# Patient Record
Sex: Female | Born: 1935 | Race: White | Hispanic: No | Smoking: Former smoker
Health system: Southern US, Community
[De-identification: ages and names within clinical notes are randomized; demographics above are authoritative.]

## PROBLEM LIST (undated history)

## (undated) DIAGNOSIS — G709 Myoneural disorder, unspecified: Secondary | ICD-10-CM

## (undated) DIAGNOSIS — E079 Disorder of thyroid, unspecified: Secondary | ICD-10-CM

## (undated) DIAGNOSIS — N3020 Other chronic cystitis without hematuria: Secondary | ICD-10-CM

## (undated) DIAGNOSIS — C801 Malignant (primary) neoplasm, unspecified: Secondary | ICD-10-CM

## (undated) DIAGNOSIS — G252 Other specified forms of tremor: Secondary | ICD-10-CM

## (undated) DIAGNOSIS — I2510 Atherosclerotic heart disease of native coronary artery without angina pectoris: Secondary | ICD-10-CM

## (undated) DIAGNOSIS — E039 Hypothyroidism, unspecified: Secondary | ICD-10-CM

## (undated) DIAGNOSIS — G4700 Insomnia, unspecified: Secondary | ICD-10-CM

## (undated) DIAGNOSIS — E785 Hyperlipidemia, unspecified: Secondary | ICD-10-CM

## (undated) DIAGNOSIS — Z8579 Personal history of other malignant neoplasms of lymphoid, hematopoietic and related tissues: Secondary | ICD-10-CM

## (undated) DIAGNOSIS — Z8572 Personal history of non-Hodgkin lymphomas: Secondary | ICD-10-CM

## (undated) DIAGNOSIS — I10 Essential (primary) hypertension: Secondary | ICD-10-CM

## (undated) DIAGNOSIS — Z789 Other specified health status: Secondary | ICD-10-CM

## (undated) DIAGNOSIS — K219 Gastro-esophageal reflux disease without esophagitis: Secondary | ICD-10-CM

## (undated) DIAGNOSIS — D649 Anemia, unspecified: Secondary | ICD-10-CM

## (undated) HISTORY — DX: Other chronic cystitis without hematuria: N30.20

## (undated) HISTORY — DX: Personal history of non-Hodgkin lymphomas: Z85.72

## (undated) HISTORY — PX: ABDOMINAL HYSTERECTOMY: SHX81

## (undated) HISTORY — DX: Malignant (primary) neoplasm, unspecified: C80.1

## (undated) HISTORY — DX: Essential (primary) hypertension: I10

## (undated) HISTORY — DX: Gastro-esophageal reflux disease without esophagitis: K21.9

## (undated) HISTORY — PX: APPENDECTOMY: SHX54

## (undated) HISTORY — PX: STOMACH SURGERY: SHX791

## (undated) HISTORY — DX: Personal history of other malignant neoplasms of lymphoid, hematopoietic and related tissues: Z85.79

## (undated) HISTORY — DX: Hyperlipidemia, unspecified: E78.5

## (undated) HISTORY — DX: Disorder of thyroid, unspecified: E07.9

## (undated) HISTORY — PX: BACK SURGERY: SHX140

## (undated) HISTORY — PX: EYE SURGERY: SHX253

---

## 1998-12-23 ENCOUNTER — Ambulatory Visit (HOSPITAL_COMMUNITY): Admission: RE | Admit: 1998-12-23 | Discharge: 1998-12-23 | Payer: Self-pay | Admitting: Obstetrics & Gynecology

## 2003-07-08 ENCOUNTER — Ambulatory Visit (HOSPITAL_BASED_OUTPATIENT_CLINIC_OR_DEPARTMENT_OTHER): Admission: RE | Admit: 2003-07-08 | Discharge: 2003-07-08 | Payer: Self-pay | Admitting: Neurology

## 2003-08-01 HISTORY — PX: CARDIAC CATHETERIZATION: SHX172

## 2004-04-30 ENCOUNTER — Ambulatory Visit: Payer: Self-pay | Admitting: Oncology

## 2004-07-12 ENCOUNTER — Ambulatory Visit: Payer: Self-pay | Admitting: Oncology

## 2004-07-27 ENCOUNTER — Ambulatory Visit: Payer: Self-pay | Admitting: Internal Medicine

## 2004-10-11 ENCOUNTER — Ambulatory Visit: Payer: Self-pay | Admitting: Oncology

## 2005-01-12 ENCOUNTER — Ambulatory Visit: Payer: Self-pay | Admitting: Vascular Surgery

## 2005-01-18 ENCOUNTER — Ambulatory Visit: Payer: Self-pay | Admitting: Oncology

## 2005-01-28 ENCOUNTER — Ambulatory Visit: Payer: Self-pay | Admitting: Oncology

## 2005-03-16 ENCOUNTER — Other Ambulatory Visit: Payer: Self-pay

## 2005-03-16 ENCOUNTER — Emergency Department: Payer: Self-pay | Admitting: Unknown Physician Specialty

## 2005-03-16 ENCOUNTER — Ambulatory Visit: Payer: Self-pay | Admitting: Oncology

## 2005-03-17 ENCOUNTER — Ambulatory Visit: Payer: Self-pay | Admitting: Internal Medicine

## 2005-04-07 ENCOUNTER — Ambulatory Visit: Payer: Self-pay | Admitting: Internal Medicine

## 2005-04-19 ENCOUNTER — Ambulatory Visit: Payer: Self-pay | Admitting: Surgery

## 2005-04-28 ENCOUNTER — Ambulatory Visit: Payer: Self-pay | Admitting: Oncology

## 2005-04-30 ENCOUNTER — Ambulatory Visit: Payer: Self-pay | Admitting: Oncology

## 2005-05-03 ENCOUNTER — Ambulatory Visit: Payer: Self-pay | Admitting: Surgery

## 2005-05-23 ENCOUNTER — Ambulatory Visit: Payer: Self-pay | Admitting: Internal Medicine

## 2005-06-07 ENCOUNTER — Ambulatory Visit: Payer: Self-pay | Admitting: Oncology

## 2005-06-16 ENCOUNTER — Ambulatory Visit: Payer: Self-pay | Admitting: Urology

## 2005-06-23 ENCOUNTER — Ambulatory Visit: Payer: Self-pay | Admitting: Unknown Physician Specialty

## 2005-06-30 ENCOUNTER — Ambulatory Visit: Payer: Self-pay | Admitting: Oncology

## 2005-07-11 ENCOUNTER — Ambulatory Visit: Payer: Self-pay | Admitting: Unknown Physician Specialty

## 2005-08-08 ENCOUNTER — Ambulatory Visit: Payer: Self-pay | Admitting: Oncology

## 2005-08-31 ENCOUNTER — Ambulatory Visit: Payer: Self-pay | Admitting: Oncology

## 2005-10-10 ENCOUNTER — Ambulatory Visit: Payer: Self-pay | Admitting: Oncology

## 2005-10-29 ENCOUNTER — Ambulatory Visit: Payer: Self-pay | Admitting: Oncology

## 2005-12-14 ENCOUNTER — Ambulatory Visit: Payer: Self-pay | Admitting: Unknown Physician Specialty

## 2005-12-28 ENCOUNTER — Ambulatory Visit: Payer: Self-pay | Admitting: Oncology

## 2006-01-03 ENCOUNTER — Ambulatory Visit: Payer: Self-pay | Admitting: Internal Medicine

## 2006-02-07 ENCOUNTER — Ambulatory Visit (HOSPITAL_COMMUNITY): Admission: RE | Admit: 2006-02-07 | Discharge: 2006-02-07 | Payer: Self-pay | Admitting: *Deleted

## 2006-03-01 ENCOUNTER — Ambulatory Visit: Payer: Self-pay | Admitting: Oncology

## 2006-03-31 ENCOUNTER — Ambulatory Visit: Payer: Self-pay | Admitting: Oncology

## 2006-05-09 ENCOUNTER — Ambulatory Visit: Payer: Self-pay | Admitting: Oncology

## 2006-05-21 ENCOUNTER — Ambulatory Visit: Payer: Self-pay | Admitting: Surgery

## 2006-05-31 ENCOUNTER — Ambulatory Visit: Payer: Self-pay | Admitting: Oncology

## 2006-07-10 ENCOUNTER — Ambulatory Visit: Payer: Self-pay | Admitting: Unknown Physician Specialty

## 2006-07-31 HISTORY — PX: SPINE SURGERY: SHX786

## 2006-10-12 ENCOUNTER — Ambulatory Visit: Payer: Self-pay

## 2006-11-26 ENCOUNTER — Encounter: Admission: RE | Admit: 2006-11-26 | Discharge: 2006-11-26 | Payer: Self-pay | Admitting: Specialist

## 2006-11-29 ENCOUNTER — Ambulatory Visit: Payer: Self-pay | Admitting: Oncology

## 2006-12-18 ENCOUNTER — Ambulatory Visit: Payer: Self-pay | Admitting: Oncology

## 2006-12-30 ENCOUNTER — Ambulatory Visit: Payer: Self-pay | Admitting: Oncology

## 2007-01-24 ENCOUNTER — Ambulatory Visit: Payer: Self-pay | Admitting: Unknown Physician Specialty

## 2007-01-29 ENCOUNTER — Ambulatory Visit: Payer: Self-pay | Admitting: Oncology

## 2007-03-04 ENCOUNTER — Other Ambulatory Visit: Payer: Self-pay

## 2007-03-04 ENCOUNTER — Emergency Department: Payer: Self-pay | Admitting: Internal Medicine

## 2007-03-06 ENCOUNTER — Inpatient Hospital Stay: Payer: Self-pay | Admitting: Internal Medicine

## 2007-03-06 ENCOUNTER — Other Ambulatory Visit: Payer: Self-pay

## 2007-03-08 ENCOUNTER — Other Ambulatory Visit: Payer: Self-pay

## 2007-03-27 ENCOUNTER — Inpatient Hospital Stay (HOSPITAL_COMMUNITY): Admission: RE | Admit: 2007-03-27 | Discharge: 2007-04-03 | Payer: Self-pay | Admitting: Neurosurgery

## 2007-04-03 ENCOUNTER — Encounter: Payer: Self-pay | Admitting: Internal Medicine

## 2007-04-29 ENCOUNTER — Ambulatory Visit: Payer: Self-pay | Admitting: Neurosurgery

## 2007-06-01 ENCOUNTER — Ambulatory Visit: Payer: Self-pay | Admitting: Oncology

## 2007-06-19 ENCOUNTER — Ambulatory Visit: Payer: Self-pay | Admitting: Oncology

## 2007-07-01 ENCOUNTER — Ambulatory Visit: Payer: Self-pay | Admitting: Oncology

## 2007-07-15 ENCOUNTER — Ambulatory Visit: Payer: Self-pay | Admitting: Internal Medicine

## 2007-07-20 ENCOUNTER — Emergency Department: Payer: Self-pay | Admitting: Emergency Medicine

## 2007-07-20 ENCOUNTER — Other Ambulatory Visit: Payer: Self-pay

## 2007-07-23 ENCOUNTER — Encounter: Payer: Self-pay | Admitting: Internal Medicine

## 2007-08-01 ENCOUNTER — Ambulatory Visit: Payer: Self-pay | Admitting: Oncology

## 2007-08-01 ENCOUNTER — Encounter: Payer: Self-pay | Admitting: Internal Medicine

## 2007-08-21 ENCOUNTER — Ambulatory Visit: Payer: Self-pay | Admitting: Internal Medicine

## 2007-09-01 ENCOUNTER — Ambulatory Visit: Payer: Self-pay | Admitting: Oncology

## 2007-09-29 ENCOUNTER — Ambulatory Visit: Payer: Self-pay | Admitting: Oncology

## 2007-10-30 ENCOUNTER — Ambulatory Visit: Payer: Self-pay | Admitting: Oncology

## 2007-11-29 ENCOUNTER — Ambulatory Visit: Payer: Self-pay | Admitting: Oncology

## 2007-12-30 ENCOUNTER — Ambulatory Visit: Payer: Self-pay | Admitting: Oncology

## 2008-01-15 ENCOUNTER — Ambulatory Visit: Payer: Self-pay | Admitting: Internal Medicine

## 2008-01-23 ENCOUNTER — Ambulatory Visit: Payer: Self-pay | Admitting: Oncology

## 2008-01-29 ENCOUNTER — Ambulatory Visit: Payer: Self-pay | Admitting: Oncology

## 2008-02-25 ENCOUNTER — Encounter: Payer: Self-pay | Admitting: Internal Medicine

## 2008-02-26 ENCOUNTER — Ambulatory Visit: Payer: Self-pay | Admitting: Specialist

## 2008-03-31 ENCOUNTER — Ambulatory Visit: Payer: Self-pay | Admitting: Oncology

## 2008-04-17 ENCOUNTER — Ambulatory Visit: Payer: Self-pay | Admitting: Oncology

## 2008-04-27 ENCOUNTER — Ambulatory Visit: Payer: Self-pay | Admitting: Urology

## 2008-04-30 ENCOUNTER — Ambulatory Visit: Payer: Self-pay | Admitting: Oncology

## 2008-07-14 ENCOUNTER — Ambulatory Visit: Payer: Self-pay | Admitting: Internal Medicine

## 2008-07-31 ENCOUNTER — Ambulatory Visit: Payer: Self-pay | Admitting: Oncology

## 2008-08-05 ENCOUNTER — Ambulatory Visit: Payer: Self-pay | Admitting: Oncology

## 2008-08-31 ENCOUNTER — Ambulatory Visit: Payer: Self-pay | Admitting: Oncology

## 2008-09-28 ENCOUNTER — Ambulatory Visit: Payer: Self-pay | Admitting: Oncology

## 2008-10-14 ENCOUNTER — Ambulatory Visit: Payer: Self-pay | Admitting: Internal Medicine

## 2008-10-28 ENCOUNTER — Ambulatory Visit: Payer: Self-pay | Admitting: Oncology

## 2008-10-29 ENCOUNTER — Ambulatory Visit: Payer: Self-pay | Admitting: Oncology

## 2008-11-04 ENCOUNTER — Ambulatory Visit: Payer: Self-pay | Admitting: Internal Medicine

## 2008-12-17 ENCOUNTER — Ambulatory Visit: Payer: Self-pay | Admitting: Internal Medicine

## 2009-04-30 ENCOUNTER — Ambulatory Visit: Payer: Self-pay | Admitting: Oncology

## 2009-05-03 ENCOUNTER — Ambulatory Visit: Payer: Self-pay | Admitting: Oncology

## 2009-05-31 ENCOUNTER — Ambulatory Visit: Payer: Self-pay | Admitting: Oncology

## 2009-06-01 ENCOUNTER — Inpatient Hospital Stay: Payer: Self-pay | Admitting: Internal Medicine

## 2009-07-31 ENCOUNTER — Ambulatory Visit: Payer: Self-pay | Admitting: Oncology

## 2009-08-20 ENCOUNTER — Ambulatory Visit: Payer: Self-pay | Admitting: Oncology

## 2009-08-31 ENCOUNTER — Ambulatory Visit: Payer: Self-pay | Admitting: Oncology

## 2009-09-29 ENCOUNTER — Ambulatory Visit: Payer: Self-pay | Admitting: Internal Medicine

## 2009-10-06 ENCOUNTER — Ambulatory Visit: Payer: Self-pay | Admitting: Internal Medicine

## 2009-10-12 ENCOUNTER — Ambulatory Visit: Payer: Self-pay | Admitting: Internal Medicine

## 2009-10-19 ENCOUNTER — Ambulatory Visit: Payer: Self-pay | Admitting: Internal Medicine

## 2009-10-22 ENCOUNTER — Ambulatory Visit: Payer: Self-pay | Admitting: Internal Medicine

## 2009-10-25 ENCOUNTER — Ambulatory Visit: Payer: Self-pay | Admitting: Internal Medicine

## 2009-10-28 ENCOUNTER — Ambulatory Visit: Payer: Self-pay | Admitting: Internal Medicine

## 2009-11-01 ENCOUNTER — Ambulatory Visit: Payer: Self-pay | Admitting: Specialist

## 2009-11-05 ENCOUNTER — Ambulatory Visit: Payer: Self-pay | Admitting: Internal Medicine

## 2009-11-12 ENCOUNTER — Ambulatory Visit: Payer: Self-pay | Admitting: Internal Medicine

## 2009-11-15 ENCOUNTER — Ambulatory Visit: Payer: Self-pay | Admitting: Internal Medicine

## 2009-11-18 ENCOUNTER — Ambulatory Visit: Payer: Self-pay | Admitting: Internal Medicine

## 2009-11-25 ENCOUNTER — Ambulatory Visit: Payer: Self-pay | Admitting: Internal Medicine

## 2009-12-06 ENCOUNTER — Ambulatory Visit: Payer: Self-pay | Admitting: Internal Medicine

## 2010-01-28 ENCOUNTER — Ambulatory Visit: Payer: Self-pay | Admitting: Oncology

## 2010-02-17 ENCOUNTER — Ambulatory Visit: Payer: Self-pay | Admitting: Oncology

## 2010-02-28 ENCOUNTER — Ambulatory Visit: Payer: Self-pay | Admitting: Oncology

## 2010-03-15 ENCOUNTER — Ambulatory Visit: Payer: Self-pay | Admitting: Internal Medicine

## 2010-03-17 ENCOUNTER — Ambulatory Visit: Payer: Self-pay | Admitting: Internal Medicine

## 2010-03-31 ENCOUNTER — Ambulatory Visit: Payer: Self-pay | Admitting: Oncology

## 2010-04-07 ENCOUNTER — Emergency Department: Payer: Self-pay | Admitting: Emergency Medicine

## 2010-05-27 ENCOUNTER — Ambulatory Visit: Payer: Self-pay | Admitting: Internal Medicine

## 2010-07-15 ENCOUNTER — Ambulatory Visit: Payer: Self-pay | Admitting: Specialist

## 2010-07-31 HISTORY — PX: JOINT REPLACEMENT: SHX530

## 2010-08-23 ENCOUNTER — Ambulatory Visit: Payer: Self-pay | Admitting: Oncology

## 2010-08-26 ENCOUNTER — Ambulatory Visit: Payer: Self-pay | Admitting: Internal Medicine

## 2010-08-31 ENCOUNTER — Ambulatory Visit: Payer: Self-pay | Admitting: Oncology

## 2010-09-28 ENCOUNTER — Ambulatory Visit: Payer: Self-pay | Admitting: Pain Medicine

## 2010-10-11 ENCOUNTER — Ambulatory Visit: Payer: Self-pay | Admitting: Pain Medicine

## 2010-10-24 ENCOUNTER — Ambulatory Visit: Payer: Self-pay | Admitting: Pain Medicine

## 2010-10-27 ENCOUNTER — Ambulatory Visit: Payer: Self-pay | Admitting: Pain Medicine

## 2010-10-30 ENCOUNTER — Observation Stay: Payer: Self-pay | Admitting: Internal Medicine

## 2010-11-07 ENCOUNTER — Ambulatory Visit: Payer: Self-pay | Admitting: Pain Medicine

## 2010-11-16 ENCOUNTER — Ambulatory Visit: Payer: Self-pay | Admitting: Unknown Physician Specialty

## 2010-11-29 ENCOUNTER — Encounter: Payer: Self-pay | Admitting: Pain Medicine

## 2010-12-13 NOTE — Discharge Summary (Signed)
NAMEMECCA, Evelyn [name] NO.:  [id number]   MEDICAL RECORD NO.:  [id number]          PATIENT TYPE:  INP   LOCATION:  3034                         FACILITY:  [facility]   PHYSICIAN:  Hilda Lias, M.D.   DATE OF BIRTH:  May 18, 1920   DATE OF ADMISSION:  03/27/2007  DATE OF DISCHARGE:                               DISCHARGE SUMMARY   ADMISSION DIAGNOSIS:  Right L4-5 herniated disk.   FINAL DIAGNOSIS:  Right L4-5 herniated disk plus fracture of the right  foot.   HISTORY OF PRESENT ILLNESS:  The patient was admitted because of back  pain radiating to the right leg.  X-ray shows degenerative disk disease  with scoliosis with a herniated disk at the L4-5.  The patient wanted to  proceed with surgery.  Laboratory was normal.   HOSPITAL COURSE:  The patient was taken to surgery and a right L4-5  diskectomy was done.  After surgery, the pain got better until she  developed some numbness with no weakness.  She was stable but on August  31,2008, she fell and she complained of pain in the right leg.  X-ray  showed that she had a fracture of the fifth metatarsal bone in the right  side.  Right now she is complaining of some numbness, pain in the right  foot and pain in the right thigh. The patient was seen by the orthopedic  surgeon  and a boot was given for pain control.  Today she is feeling  better.  She is comfortably discharged to be followed by me and by Dr.  Sherlean Foot in a week from now.   CONDITION ON DISCHARGE:  Stable.   MEDICATIONS:  She was taking Neurontin 100 mg three times a day.  She  will continue taking her Vicodin for pain and all the medication that  she was taking prior to her discharge.  A copy will be enclosed with the  discharge summary.   DIET:  Regular.           ______________________________  Hilda Lias, M.D.     EB/MEDQ  D:  04/03/2007  T:  04/03/2007  Job:  [id number]

## 2010-12-13 NOTE — Op Note (Signed)
[name], [name] NO.:  [id number]   MEDICAL RECORD NO.:  [id number]          PATIENT TYPE:  INP   LOCATION:  3172                         FACILITY:  [facility]   PHYSICIAN:  Hilda Lias, M.D.   DATE OF BIRTH:  05/19/1920   DATE OF PROCEDURE:  DATE OF DISCHARGE:                               OPERATIVE REPORT   PREOPERATIVE DIAGNOSES:  1. Degenerative disk disease at multiple levels with severe scoliosis      to the right side.  2. Herniated disk L4-5 to the right with L5 radiculopathy.   POSTOPERATIVE DIAGNOSES:  1. Degenerative disk disease at multiple levels with severe scoliosis      to the right side.  2. Herniated disk L4-5 to the right with L5 radiculopathy.   PROCEDURES:  1. Right L4-5 laminotomy.  2. Right L4-5 diskectomy.  3. Foraminotomy to decompress the L5 and L4 nerve roots      microscopically.   SURGEON:  Hilda Lias, M.D.   ASSISTANT:  Coletta Memos, M.D.   CLINICAL HISTORY:  The patient is an 77 year old female complaining of  back pain with radiation to the right leg.  X-ray showed the presence of  severe degenerative disk disease with scoliosis to the right side with  compression to the levels of 3-4 and 4-5 with herniated disk central to  the right.  Patient has failed with conservative treatment and she wants  to proceed with surgery.   PROCEDURE:  Patient was taken to the operating room.  She was positioned  __________ .  The skin was prepped with DuraPrep.  Then x-rays showed  that indeed we were at the level of L4-5.  From then on, the skin was  opened at the midline from L4 to L5 and muscles were retracted  laterally.  Immediately, we counted the lower space and then we started  working with laminotomy L4-5 space above __________  with 2 x-rays.  Because of poor quality we ended up having a third one which showed that  indeed the needle was at the level of L4-5 disk.  The hook was at the  level of the L5 foramen.  From  then on, we did lysis of adhesions.  We  retracted the dura sac.  Indeed there was a herniated disk mostly  central and to the right, not only compromising the apex of L5, but also  the lateral ossicles of L4.  An incision was made and __________  beginning at the disk, mostly lateral were removed.  Therefore,  laminotomy to decompress the L4 and L5 nerve root was achieved.  A  __________  plenty of space.  Valsalva maneuver was negative.  Again,  the area was irrigated.  The wound was closed with a Vicryl and a Steri-  Strip.           ______________________________  Hilda Lias, M.D.    EB/MEDQ  D:  03/27/2007  T:  03/28/2007  Job:  [id number]

## 2010-12-30 ENCOUNTER — Encounter: Payer: Self-pay | Admitting: Pain Medicine

## 2011-02-20 ENCOUNTER — Ambulatory Visit: Payer: Self-pay | Admitting: Internal Medicine

## 2011-02-20 ENCOUNTER — Inpatient Hospital Stay: Payer: Self-pay | Admitting: Internal Medicine

## 2011-02-27 ENCOUNTER — Encounter: Payer: Self-pay | Admitting: Internal Medicine

## 2011-03-01 ENCOUNTER — Encounter: Payer: Self-pay | Admitting: Internal Medicine

## 2011-03-20 ENCOUNTER — Telehealth: Payer: Self-pay | Admitting: Internal Medicine

## 2011-03-20 MED ORDER — CEPHALEXIN 500 MG PO CAPS: 500.0000 mg | ORAL_CAPSULE | Freq: Three times a day (TID) | ORAL | Status: AC

## 2011-03-20 NOTE — Telephone Encounter (Signed)
Please ask her to drop off of urine if she can.  We can call her in cephalexin 500 mg three times daily for 7 days  21 no refills. Remind front desk to get her to sign  A release form when she comes by

## 2011-03-20 NOTE — Telephone Encounter (Signed)
Patient stated she can't come in because she can't drive.  Rx sent electronically.

## 2011-03-20 NOTE — Telephone Encounter (Signed)
Patient called and stated she is just getting home from the hospital and she thinks she has a terrible bladder infection.  She stated her urine has a foul smell and her bladder hurts bad.  Please advise.

## 2011-03-28 ENCOUNTER — Inpatient Hospital Stay: Payer: Self-pay | Admitting: Internal Medicine

## 2011-03-29 DIAGNOSIS — I951 Orthostatic hypotension: Secondary | ICD-10-CM

## 2011-03-29 DIAGNOSIS — K529 Noninfective gastroenteritis and colitis, unspecified: Secondary | ICD-10-CM

## 2011-03-29 DIAGNOSIS — E876 Hypokalemia: Secondary | ICD-10-CM

## 2011-03-29 DIAGNOSIS — R55 Syncope and collapse: Secondary | ICD-10-CM

## 2011-04-01 ENCOUNTER — Encounter: Payer: Self-pay | Admitting: Internal Medicine

## 2011-04-01 DIAGNOSIS — R55 Syncope and collapse: Secondary | ICD-10-CM

## 2011-04-01 DIAGNOSIS — E860 Dehydration: Secondary | ICD-10-CM

## 2011-04-01 DIAGNOSIS — K529 Noninfective gastroenteritis and colitis, unspecified: Secondary | ICD-10-CM

## 2011-04-01 DIAGNOSIS — E876 Hypokalemia: Secondary | ICD-10-CM

## 2011-04-03 ENCOUNTER — Emergency Department: Payer: Self-pay | Admitting: Unknown Physician Specialty

## 2011-04-04 ENCOUNTER — Ambulatory Visit (INDEPENDENT_AMBULATORY_CARE_PROVIDER_SITE_OTHER): Payer: Medicare Other | Admitting: Internal Medicine

## 2011-04-04 ENCOUNTER — Encounter: Payer: Self-pay | Admitting: Internal Medicine

## 2011-04-04 VITALS — BP 121/74 | HR 75 | Temp 97.8°F | Resp 16 | Wt 138.5 lb

## 2011-04-04 DIAGNOSIS — N390 Urinary tract infection, site not specified: Secondary | ICD-10-CM

## 2011-04-04 DIAGNOSIS — I499 Cardiac arrhythmia, unspecified: Secondary | ICD-10-CM

## 2011-04-04 LAB — POCT URINALYSIS DIPSTICK
Bilirubin, UA: NEGATIVE
Glucose, UA: NEGATIVE
Nitrite, UA: POSITIVE

## 2011-04-04 MED ORDER — CEPHALEXIN 500 MG PO CAPS: 500.0000 mg | ORAL_CAPSULE | Freq: Three times a day (TID) | ORAL | Status: AC

## 2011-04-04 NOTE — Patient Instructions (Addendum)
Reduce your propranolol tablet to 1/2 tablet two times daily or just take one tablet daily . This medicine slows down your heart rate and we need to cut it back.  We will restart the medicine for your bladder infection, ( Keflex ) since you are having symptoms.   Continue to avoid dairy and salad until your stools become solid.

## 2011-04-04 NOTE — Progress Notes (Signed)
  Subjective:    Patient ID: Evelyn [name], female    DOB: 03-31-1920, 77 y.o.   MRN: [id number]  HPI 77 yo white female with history of CAD s/p stent, hypothyroidism, essential tremor, recently discharged from [facility]  2  days ago for dehydration and syncope secondary to viral gastroenteritis (stool studeis were all negative).  Was sent to ER on Sunday for bradycardia discovered by  PT.  No EKG done in ER bc hr was 75 and BP was fine.  Patient takes propanolol for essential head tremor. Also reporting nocturia x 3 and bladder pain .   Review of Systems  Constitutional: Negative for fever, chills and unexpected weight change.  HENT: Negative for hearing loss, ear pain, nosebleeds, congestion, sore throat, facial swelling, rhinorrhea, sneezing, mouth sores, trouble swallowing, neck pain, neck stiffness, voice change, postnasal drip, sinus pressure, tinnitus and ear discharge.   Eyes: Negative for pain, discharge, redness and visual disturbance.  Respiratory: Negative for cough, chest tightness, shortness of breath, wheezing and stridor.   Cardiovascular: Negative for chest pain, palpitations and leg swelling.  Musculoskeletal: Negative for myalgias and arthralgias.  Skin: Negative for color change and rash.  Neurological: Negative for dizziness, weakness, light-headedness and headaches.  Hematological: Negative for adenopathy.       Objective:   Physical Exam  Constitutional: She is oriented to person, place, and time. She appears well-developed and well-nourished.  HENT:  Mouth/Throat: Oropharynx is clear and moist.  Eyes: EOM are normal. Pupils are equal, round, and reactive to light. No scleral icterus.  Neck: Normal range of motion. Neck supple. No JVD present. No thyromegaly present.  Cardiovascular: Normal rate, regular rhythm, normal heart sounds and intact distal pulses.   Pulmonary/Chest: Effort normal and breath sounds normal.  Abdominal: Soft. Bowel sounds are normal. She  exhibits no mass. There is no tenderness.  Musculoskeletal: Normal range of motion. She exhibits no edema.  Lymphadenopathy:    She has no cervical adenopathy.  Neurological: She is alert and oriented to person, place, and time.  Skin: Skin is warm and dry.  Psychiatric: She has a normal mood and affect.          Assessment & Plan:  Arrhythmia: brief, will stop the propranolol since she had bradycardia into the 30s without symptoms other than tiredness post discharge, UTI:  Repeat symptoms, empiric keflex, pending culture data  Diarrhea: secondary to viral gastroenteritis,  All cultures are negative

## 2011-04-05 ENCOUNTER — Telehealth: Payer: Self-pay | Admitting: Internal Medicine

## 2011-04-05 NOTE — Telephone Encounter (Signed)
Evelyn [name] a PT from [facility] called and stated he was with the patient today and she appeared to be fine.  Her resting heart rate was 82 but after her exercise he told him her heart felt like it was racing, he took her pulse and it was 42.  He stated her BP was fine it was 110/58 and her O2 was 98%.  She stated she felt fine and was in no distress.   He wanted you to be aware.

## 2011-04-07 ENCOUNTER — Other Ambulatory Visit: Payer: Medicare Other | Admitting: *Deleted

## 2011-04-10 ENCOUNTER — Telehealth: Payer: Self-pay | Admitting: Internal Medicine

## 2011-04-10 LAB — URINE CULTURE: Colony Count: >=100000

## 2011-04-10 NOTE — Telephone Encounter (Signed)
Patient called and wanted to know if we have got her urine results back yet.  I see them in her chart but not sure if they were routed to you.  Please advise

## 2011-04-10 NOTE — Telephone Encounter (Signed)
Yes, and i called keflex in to her pharmacy.  They should have called her .  She should start takign it

## 2011-04-11 ENCOUNTER — Other Ambulatory Visit: Payer: Self-pay | Admitting: Internal Medicine

## 2011-04-11 MED ORDER — LEVOTHYROXINE SODIUM 100 MCG PO TABS: 100.0000 ug | ORAL_TABLET | Freq: Every day | ORAL | Status: DC

## 2011-04-11 NOTE — Telephone Encounter (Signed)
1) ok to refill her levoxyl with #30 and 6 refills  2) Ask her to submit another urine for culture

## 2011-04-11 NOTE — Telephone Encounter (Signed)
Received faxed refill request from pharmacy. Is it okay to refill Levoxyl?

## 2011-04-11 NOTE — Telephone Encounter (Signed)
I advised patient that you called in Keflex for her UTI when she was in the office.  She stated she has been taking that medicine but her urine still smells so bad she can hardly stand it.  She doesn't think the antibiotic is working.  Please advise.

## 2011-04-12 NOTE — Telephone Encounter (Signed)
I asked patient to submit another urine for culture.  She stated she thinks it is better today but if it gets worse again she will give another specimen at the office.

## 2011-04-13 NOTE — Telephone Encounter (Signed)
Patient notified. She has already finished her keflex.

## 2011-04-27 ENCOUNTER — Telehealth: Payer: Self-pay | Admitting: Internal Medicine

## 2011-04-27 NOTE — Telephone Encounter (Signed)
Patient called and stated Dr. Kirtland Bouchard the surgeon who did her hip wants to put her on Celebrex, but he wanted to know if you agree that patient could take it.  Please advise

## 2011-04-27 NOTE — Telephone Encounter (Signed)
Notified patient ok to start Celebrex

## 2011-04-27 NOTE — Telephone Encounter (Signed)
Yes ok to start the celebrex

## 2011-05-09 ENCOUNTER — Encounter: Payer: Self-pay | Admitting: Internal Medicine

## 2011-05-12 LAB — COMPREHENSIVE METABOLIC PANEL
AST: 15
Albumin: 3.6
Alkaline Phosphatase: 80
Chloride: 101
Creatinine, Ser: 0.79
Potassium: 4.3
Sodium: 138
Total Bilirubin: 0.5

## 2011-05-12 LAB — CBC
Platelets: 446 — ABNORMAL HIGH
RDW: 15.3 — ABNORMAL HIGH

## 2011-05-18 ENCOUNTER — Other Ambulatory Visit: Payer: Self-pay | Admitting: *Deleted

## 2011-05-19 MED ORDER — TEMAZEPAM 15 MG PO CAPS: 15.0000 mg | ORAL_CAPSULE | Freq: Every evening | ORAL | Status: DC | PRN

## 2011-05-31 ENCOUNTER — Other Ambulatory Visit: Payer: Self-pay | Admitting: Internal Medicine

## 2011-06-13 ENCOUNTER — Ambulatory Visit (INDEPENDENT_AMBULATORY_CARE_PROVIDER_SITE_OTHER): Payer: Medicare Other | Admitting: *Deleted

## 2011-06-13 DIAGNOSIS — E538 Deficiency of other specified B group vitamins: Secondary | ICD-10-CM

## 2011-06-13 DIAGNOSIS — Z23 Encounter for immunization: Secondary | ICD-10-CM

## 2011-06-13 DIAGNOSIS — N390 Urinary tract infection, site not specified: Secondary | ICD-10-CM

## 2011-06-13 MED ORDER — CYANOCOBALAMIN 1000 MCG/ML IJ SOLN
1000.0000 ug | Freq: Once | INTRAMUSCULAR | Status: AC
  Administered 2011-06-13: 1000 ug via INTRAMUSCULAR

## 2011-06-14 LAB — POCT URINALYSIS DIPSTICK
Bilirubin, UA: NEGATIVE
Ketones, UA: NEGATIVE
pH, UA: 5

## 2011-06-16 ENCOUNTER — Telehealth: Payer: Self-pay | Admitting: Internal Medicine

## 2011-06-16 NOTE — Telephone Encounter (Signed)
We will call her when we receive the results of her culture.,  It has not been resulted  yet

## 2011-06-16 NOTE — Telephone Encounter (Signed)
Notified patient of message.

## 2011-06-16 NOTE — Telephone Encounter (Signed)
Patient called wanting results from urine culture.

## 2011-06-17 LAB — URINE CULTURE

## 2011-06-19 ENCOUNTER — Ambulatory Visit (INDEPENDENT_AMBULATORY_CARE_PROVIDER_SITE_OTHER): Payer: Medicare Other | Admitting: *Deleted

## 2011-06-19 DIAGNOSIS — E538 Deficiency of other specified B group vitamins: Secondary | ICD-10-CM

## 2011-06-19 MED ORDER — CYANOCOBALAMIN 1000 MCG/ML IJ SOLN
1000.0000 ug | Freq: Once | INTRAMUSCULAR | Status: AC
  Administered 2011-06-19: 1000 ug via INTRAMUSCULAR

## 2011-06-19 MED ORDER — CIPROFLOXACIN HCL 250 MG PO TABS: 250.0000 mg | ORAL_TABLET | Freq: Two times a day (BID) | ORAL | Status: AC

## 2011-06-19 NOTE — Progress Notes (Signed)
Addended by: Duncan Dull on: 06/19/2011 10:45 AM   Modules accepted: Orders

## 2011-06-21 ENCOUNTER — Ambulatory Visit: Payer: Medicare Other

## 2011-06-26 ENCOUNTER — Ambulatory Visit: Payer: Medicare Other | Admitting: *Deleted

## 2011-06-26 DIAGNOSIS — E538 Deficiency of other specified B group vitamins: Secondary | ICD-10-CM

## 2011-06-26 MED ORDER — CYANOCOBALAMIN 1000 MCG/ML IJ SOLN
1000.0000 ug | Freq: Once | INTRAMUSCULAR | Status: AC
  Administered 2011-06-26: 1000 ug via INTRAMUSCULAR

## 2011-07-14 ENCOUNTER — Encounter: Payer: Self-pay | Admitting: Internal Medicine

## 2011-07-14 ENCOUNTER — Ambulatory Visit (INDEPENDENT_AMBULATORY_CARE_PROVIDER_SITE_OTHER): Payer: Medicare Other | Admitting: Internal Medicine

## 2011-07-14 VITALS — BP 144/64 | HR 66 | Temp 97.7°F | Wt 139.0 lb

## 2011-07-14 DIAGNOSIS — M19079 Primary osteoarthritis, unspecified ankle and foot: Secondary | ICD-10-CM

## 2011-07-14 DIAGNOSIS — R1013 Epigastric pain: Secondary | ICD-10-CM

## 2011-07-14 DIAGNOSIS — K3189 Other diseases of stomach and duodenum: Secondary | ICD-10-CM

## 2011-07-14 DIAGNOSIS — E785 Hyperlipidemia, unspecified: Secondary | ICD-10-CM

## 2011-07-14 DIAGNOSIS — E079 Disorder of thyroid, unspecified: Secondary | ICD-10-CM

## 2011-07-14 DIAGNOSIS — D510 Vitamin B12 deficiency anemia due to intrinsic factor deficiency: Secondary | ICD-10-CM

## 2011-07-14 DIAGNOSIS — N390 Urinary tract infection, site not specified: Secondary | ICD-10-CM

## 2011-07-14 DIAGNOSIS — Z23 Encounter for immunization: Secondary | ICD-10-CM

## 2011-07-14 DIAGNOSIS — E039 Hypothyroidism, unspecified: Secondary | ICD-10-CM

## 2011-07-14 LAB — POCT URINALYSIS DIPSTICK
Bilirubin, UA: NEGATIVE
Nitrite, UA: POSITIVE
Protein, UA: NEGATIVE
Urobilinogen, UA: 0.2
pH, UA: 5.5

## 2011-07-14 LAB — TSH: TSH: 2.01 u[IU]/mL (ref 0.35–5.50)

## 2011-07-14 MED ORDER — ESOMEPRAZOLE MAGNESIUM 40 MG PO CPDR: 40.0000 mg | DELAYED_RELEASE_CAPSULE | Freq: Every day | ORAL | Status: DC

## 2011-07-14 MED ORDER — CIPROFLOXACIN HCL 250 MG PO TABS: 250.0000 mg | ORAL_TABLET | Freq: Two times a day (BID) | ORAL | Status: DC

## 2011-07-14 MED ORDER — HYDROCODONE-ACETAMINOPHEN 7.5-500 MG PO TABS: 1.0000 | ORAL_TABLET | Freq: Two times a day (BID) | ORAL | Status: AC | PRN

## 2011-07-14 MED ORDER — CEPHALEXIN 500 MG PO CAPS: 500.0000 mg | ORAL_CAPSULE | Freq: Three times a day (TID) | ORAL | Status: AC

## 2011-07-14 NOTE — Patient Instructions (Addendum)
We are starting a medication for your stomach called nexium . It will help the burping and the indigestion.  I have called an antibiotic in for your UTI  Continue Crestor for your cholesterol.

## 2011-07-14 NOTE — Progress Notes (Signed)
Subjective:    Patient ID: Evelyn [name], female    DOB: 10-Jul-1920, 77 y.o.   MRN: [id number]  HPI   Ms Evelyn [name] is an 77 yr old white female with a history of CAD,  DJD hip, recurrent cystitis, who presents for follow up.  She has multiple chronic complaints.  Her right ankle continues to hurt ("like it's on fire"), despite conservative treatment by Dr. Hyacinth Meeker with an orthopedic boot for an unclear diagnosis, presumed tendonitis. She has a realtively new complaint of frequent burping and regurgitation of white foamy saliva and occasionally green bile .  She denies nausea, anorexia and weight loss.   And finally , she is having dysuria again and has dropped off a UA. Past Medical History  Diagnosis Date  . Hypertension   . Hyperlipidemia   . Thyroid disease   . Other chronic cystitis   . History of lymphoma       Current Outpatient Prescriptions on File Prior to Visit  Medication Sig Dispense Refill  . aspirin 81 MG tablet Take 81 mg by mouth daily.        Marland Kitchen levothyroxine (SYNTHROID, LEVOTHROID) 100 MCG tablet Take 1 tablet (100 mcg total) by mouth daily.  90 tablet  2  . propranolol (INDERAL) 20 MG tablet Take 20 mg by mouth 2 (two) times daily.        . mirtazapine (REMERON) 7.5 MG tablet TAKE ONE TABLET BY MOUTH AT BEDTIME  30 tablet  3  . rosuvastatin (CRESTOR) 10 MG tablet Take 10 mg by mouth daily.        . temazepam (RESTORIL) 15 MG capsule Take 1 capsule (15 mg total) by mouth at bedtime as needed.  30 capsule  2      Review of Systems  Constitutional: Negative for fever, chills and unexpected weight change.  HENT: Negative for hearing loss, ear pain, nosebleeds, congestion, sore throat, facial swelling, rhinorrhea, sneezing, mouth sores, trouble swallowing, neck pain, neck stiffness, voice change, postnasal drip, sinus pressure, tinnitus and ear discharge.   Eyes: Negative for pain, discharge, redness and visual disturbance.  Respiratory: Negative for cough, chest  tightness, shortness of breath, wheezing and stridor.   Cardiovascular: Negative for chest pain, palpitations and leg swelling.  Genitourinary: Positive for urgency and frequency.  Musculoskeletal: Positive for myalgias, joint swelling and arthralgias.  Skin: Negative for color change and rash.  Neurological: Negative for dizziness, weakness, light-headedness and headaches.  Hematological: Negative for adenopathy.      BP 144/64  Pulse 66  Temp 97.7 F (36.5 C)  Wt 139 lb (63.05 kg)  SpO2 99%  Objective:   Physical Exam  Constitutional: She is oriented to person, place, and time. She appears well-developed and well-nourished.  HENT:  Mouth/Throat: Oropharynx is clear and moist.  Eyes: EOM are normal. Pupils are equal, round, and reactive to light. No scleral icterus.  Neck: Normal range of motion. Neck supple. No JVD present. No thyromegaly present.  Cardiovascular: Normal rate, regular rhythm, normal heart sounds and intact distal pulses.   Pulmonary/Chest: Effort normal and breath sounds normal.  Abdominal: Soft. Bowel sounds are normal. She exhibits no mass. There is no tenderness.  Musculoskeletal: Normal range of motion. She exhibits no edema.  Lymphadenopathy:    She has no cervical adenopathy.  Neurological: She is alert and oriented to person, place, and time.  Skin: Skin is warm and dry.  Psychiatric: She has a normal mood and affect.  Assessment & Plan:   Urinary tract infection Recurrent, with multiple allergies to antibiotics, leaving [place] only keflex to try empirically, which has worked in the past. She hasseen Urology on multiple occasions and they have no interventions to offer her.   Hyperlipidemia Controlled with rosuvastatin, goal < 70 given history of CAD.   Thyroid disease She has had a TSH in the last 6 months which was normal     Updated Medication List Outpatient Encounter Prescriptions as of 07/14/2011  Medication Sig Dispense Refill  .  aspirin 81 MG tablet Take 81 mg by mouth daily.        Marland Kitchen levothyroxine (SYNTHROID, LEVOTHROID) 100 MCG tablet Take 1 tablet (100 mcg total) by mouth daily.  90 tablet  2  . propranolol (INDERAL) 20 MG tablet Take 20 mg by mouth 2 (two) times daily.        . cephALEXin (KEFLEX) 500 MG capsule Take 1 capsule (500 mg total) by mouth 3 (three) times daily.  21 capsule  0  . esomeprazole (NEXIUM) 40 MG capsule Take 1 capsule (40 mg total) by mouth daily.  30 capsule  6  . HYDROcodone-acetaminophen (LORTAB) 7.5-500 MG per tablet Take 1 tablet by mouth 2 (two) times daily as needed for pain.  20 tablet  5  . mirtazapine (REMERON) 7.5 MG tablet TAKE ONE TABLET BY MOUTH AT BEDTIME  30 tablet  3  . rosuvastatin (CRESTOR) 10 MG tablet Take 10 mg by mouth daily.        . temazepam (RESTORIL) 15 MG capsule Take 1 capsule (15 mg total) by mouth at bedtime as needed.  30 capsule  2  . DISCONTD: ciprofloxacin (CIPRO) 250 MG tablet Take 1 tablet (250 mg total) by mouth 2 (two) times daily.  10 tablet  5

## 2011-07-15 ENCOUNTER — Other Ambulatory Visit: Payer: Self-pay | Admitting: Internal Medicine

## 2011-07-16 ENCOUNTER — Encounter: Payer: Self-pay | Admitting: Internal Medicine

## 2011-07-16 DIAGNOSIS — E039 Hypothyroidism, unspecified: Secondary | ICD-10-CM | POA: Insufficient documentation

## 2011-07-16 DIAGNOSIS — E785 Hyperlipidemia, unspecified: Secondary | ICD-10-CM | POA: Insufficient documentation

## 2011-07-16 DIAGNOSIS — Z8579 Personal history of other malignant neoplasms of lymphoid, hematopoietic and related tissues: Secondary | ICD-10-CM | POA: Insufficient documentation

## 2011-07-16 DIAGNOSIS — I10 Essential (primary) hypertension: Secondary | ICD-10-CM | POA: Insufficient documentation

## 2011-07-16 DIAGNOSIS — N3020 Other chronic cystitis without hematuria: Secondary | ICD-10-CM | POA: Insufficient documentation

## 2011-07-16 NOTE — Assessment & Plan Note (Signed)
She has had a TSH in the last 6 months which was normal

## 2011-07-16 NOTE — Assessment & Plan Note (Signed)
Controlled with rosuvastatin, goal < 70 given history of CAD.

## 2011-07-16 NOTE — Assessment & Plan Note (Signed)
Recurrent, with multiple allergies to antibiotics, leaving [place] only keflex to try empirically, which has worked in the past. She hasseen Urology on multiple occasions and they have no interventions to offer her.

## 2011-07-18 ENCOUNTER — Telehealth: Payer: Self-pay | Admitting: *Deleted

## 2011-07-18 MED ORDER — NITROFURANTOIN MONOHYD MACRO 100 MG PO CAPS: 100.0000 mg | ORAL_CAPSULE | Freq: Two times a day (BID) | ORAL | Status: AC

## 2011-07-18 NOTE — Telephone Encounter (Signed)
See result note, pt needs macrobid, Discussed w/MD and called in RX to pharmacist at [facility]. Left pt Vm that I called in RX

## 2011-08-02 ENCOUNTER — Other Ambulatory Visit: Payer: Self-pay | Admitting: *Deleted

## 2011-08-02 MED ORDER — PROPRANOLOL HCL 20 MG PO TABS: 20.0000 mg | ORAL_TABLET | Freq: Two times a day (BID) | ORAL | Status: DC

## 2011-08-07 ENCOUNTER — Ambulatory Visit: Payer: Self-pay | Admitting: Oncology

## 2011-08-07 LAB — COMPREHENSIVE METABOLIC PANEL
Albumin: 3.5 g/dL (ref 3.4–5.0)
Alkaline Phosphatase: 65 U/L (ref 50–136)
BUN: 13 mg/dL (ref 7–18)
Bilirubin,Total: 0.3 mg/dL (ref 0.2–1.0)
Co2: 28 mmol/L (ref 21–32)
Creatinine: 0.71 mg/dL (ref 0.60–1.30)
EGFR (African American): >60
SGOT(AST): 22 U/L (ref 15–37)
SGPT (ALT): 18 U/L
Sodium: 143 mmol/L (ref 136–145)
Total Protein: 6.8 g/dL (ref 6.4–8.2)

## 2011-08-07 LAB — CBC CANCER CENTER
Basophil %: 0.3 %
Eosinophil #: 0.5 x10 3/mm (ref 0.0–0.7)
HGB: 11.3 g/dL — ABNORMAL LOW (ref 12.0–16.0)
MCH: 28.4 pg (ref 26.0–34.0)
MCHC: 33.2 g/dL (ref 32.0–36.0)
MCV: 85 fL (ref 80–100)
Monocyte #: 1 x10 3/mm — ABNORMAL HIGH (ref 0.0–0.7)
Neutrophil #: 4.8 x10 3/mm (ref 1.4–6.5)
Neutrophil %: 54.6 %
RDW: 17.3 % — ABNORMAL HIGH (ref 11.5–14.5)

## 2011-08-09 ENCOUNTER — Telehealth: Payer: Self-pay | Admitting: *Deleted

## 2011-08-09 NOTE — Telephone Encounter (Signed)
Pt's insurance has faxed a letter stating that temazepam will no longer be covered

## 2011-08-11 NOTE — Telephone Encounter (Signed)
Prior Berkley Harvey is needed for temazepam 15 mg's.  Form is in your red folder.

## 2011-08-14 NOTE — Telephone Encounter (Signed)
Prior auth approval given for temazepam, approval letter given to patient for signature and scanning.

## 2011-08-14 NOTE — Telephone Encounter (Signed)
Form faxed

## 2011-09-01 ENCOUNTER — Ambulatory Visit: Payer: Self-pay | Admitting: Oncology

## 2011-09-26 ENCOUNTER — Other Ambulatory Visit: Payer: Self-pay | Admitting: Internal Medicine

## 2011-09-28 ENCOUNTER — Ambulatory Visit (INDEPENDENT_AMBULATORY_CARE_PROVIDER_SITE_OTHER): Payer: Medicare Other | Admitting: *Deleted

## 2011-09-28 DIAGNOSIS — E538 Deficiency of other specified B group vitamins: Secondary | ICD-10-CM

## 2011-09-28 MED ORDER — CYANOCOBALAMIN 1000 MCG/ML IJ SOLN
1000.0000 ug | Freq: Once | INTRAMUSCULAR | Status: AC
  Administered 2011-09-28: 1000 ug via INTRAMUSCULAR

## 2011-10-03 ENCOUNTER — Other Ambulatory Visit: Payer: Self-pay | Admitting: Neurosurgery

## 2011-10-03 DIAGNOSIS — M549 Dorsalgia, unspecified: Secondary | ICD-10-CM

## 2011-10-04 ENCOUNTER — Ambulatory Visit
Admission: RE | Admit: 2011-10-04 | Discharge: 2011-10-04 | Disposition: A | Discharge status: Home / Self Care | Payer: Medicare Other | Source: Ambulatory Visit | Attending: Neurosurgery | Admitting: Neurosurgery

## 2011-10-04 VITALS — BP 143/64 | HR 69 | Ht 65.5 in | Wt 139.0 lb

## 2011-10-04 DIAGNOSIS — M549 Dorsalgia, unspecified: Secondary | ICD-10-CM

## 2011-10-04 MED ORDER — DIAZEPAM 5 MG PO TABS
5.0000 mg | ORAL_TABLET | Freq: Once | ORAL | Status: AC
  Administered 2011-10-04: 5 mg via ORAL

## 2011-10-04 NOTE — Progress Notes (Signed)
Resting quietly, prone on stretcher, waiting for CT scan.  States her buttocks is a little sore but does not care for any medication at present.  jkl

## 2011-10-04 NOTE — Discharge Instructions (Signed)

## 2011-10-13 ENCOUNTER — Ambulatory Visit (INDEPENDENT_AMBULATORY_CARE_PROVIDER_SITE_OTHER): Payer: Medicare Other | Admitting: Internal Medicine

## 2011-10-13 ENCOUNTER — Encounter: Payer: Self-pay | Admitting: Internal Medicine

## 2011-10-13 VITALS — BP 146/68 | HR 80 | Temp 97.7°F | Resp 16 | Ht 64.0 in | Wt 142.8 lb

## 2011-10-13 DIAGNOSIS — D510 Vitamin B12 deficiency anemia due to intrinsic factor deficiency: Secondary | ICD-10-CM

## 2011-10-13 DIAGNOSIS — N390 Urinary tract infection, site not specified: Secondary | ICD-10-CM

## 2011-10-13 DIAGNOSIS — I10 Essential (primary) hypertension: Secondary | ICD-10-CM

## 2011-10-13 DIAGNOSIS — M48061 Spinal stenosis, lumbar region without neurogenic claudication: Secondary | ICD-10-CM

## 2011-10-13 DIAGNOSIS — E538 Deficiency of other specified B group vitamins: Secondary | ICD-10-CM

## 2011-10-13 DIAGNOSIS — E079 Disorder of thyroid, unspecified: Secondary | ICD-10-CM

## 2011-10-13 DIAGNOSIS — E785 Hyperlipidemia, unspecified: Secondary | ICD-10-CM

## 2011-10-13 MED ORDER — CYANOCOBALAMIN 1000 MCG/ML IJ SOLN
1000.0000 ug | Freq: Once | INTRAMUSCULAR | Status: AC
  Administered 2011-10-13: 1000 ug via INTRAMUSCULAR

## 2011-10-13 MED ORDER — LEVOTHYROXINE SODIUM 100 MCG PO TABS: 100.0000 ug | ORAL_TABLET | Freq: Every day | ORAL | Status: DC

## 2011-10-13 MED ORDER — ROSUVASTATIN CALCIUM 10 MG PO TABS: 10.0000 mg | ORAL_TABLET | Freq: Every day | ORAL | Status: DC

## 2011-10-13 NOTE — Progress Notes (Deleted)
  Subjective:    Patient ID: Evelyn [name], female    DOB: 08-03-1919, 78 y.o.   MRN: [id number]  HPI    Review of Systems     Objective:   Physical Exam        Assessment & Plan:

## 2011-10-13 NOTE — Patient Instructions (Addendum)
Resume the crestor for your cholesterol  , which is very very high  Return once a month for a B12  Shot  We will recheck cholesterol in 2 months  with your scheduled B1 2 shot

## 2011-10-15 ENCOUNTER — Encounter: Payer: Self-pay | Admitting: Internal Medicine

## 2011-10-15 DIAGNOSIS — D510 Vitamin B12 deficiency anemia due to intrinsic factor deficiency: Secondary | ICD-10-CM | POA: Insufficient documentation

## 2011-10-15 DIAGNOSIS — M48061 Spinal stenosis, lumbar region without neurogenic claudication: Secondary | ICD-10-CM | POA: Insufficient documentation

## 2011-10-15 NOTE — Assessment & Plan Note (Signed)
She had stopped her statin for unclear reasons several months ago and repeat lipids were uncontrolled. We have discussed resuming Crestor and she was given samples for 8 weeks with repeat lipids planned  prior to initiation of prescription therapy.

## 2011-10-15 NOTE — Progress Notes (Signed)
Patient ID: Evelyn [name], female   DOB: 11/25/1919, 78 y.o.   MRN: [id number]  Patient Active Problem List  Diagnoses  . Urinary tract infection  . Hypertension  . Hyperlipidemia  . Thyroid disease  . Other chronic cystitis  . History of lymphoma    Subjective:  Chief Complaint  Patient presents with  . Follow-up    3 month    HPI:  Evelyn [name] is an 78 year old white female with a history of hypertension lymphoma in remission, chronic cystitis, and degenerative joint disease who presents for regular follow up.  Despite undergoing right hip replacement last year by Dr. Martha Clan, she  continues to have hip pain and back pain which radiates down her right leg with areas of numbness and tingling. She also reports that she has saddle paresthesias and is occasionally incontinent of bowel and bladder function.  She is scheduled to see Dr.Botero following more workup on her spinal stenosis to discuss surger   Past Medical History  Diagnosis Date  . Hypertension   . Hyperlipidemia   . Thyroid disease   . Other chronic cystitis   . History of lymphoma   . Cancer    Past Surgical History  Procedure Date  . Joint replacement 2012    right hip    Medications: Current Outpatient Prescriptions on File Prior to Visit  Medication Sig Dispense Refill  . aspirin 81 MG tablet Take 81 mg by mouth daily.        . mirtazapine (REMERON) 7.5 MG tablet TAKE ONE TABLET BY MOUTH AT BEDTIME  30 tablet  3  . propranolol (INDERAL) 20 MG tablet Take 1 tablet (20 mg total) by mouth 2 (two) times daily.  180 tablet  3     Social History:  History   Social History  . Marital Status: Widowed    Spouse Name: N/A    Number of Children: N/A  . Years of Education: N/A   Social History Main Topics  . Smoking status: Former Smoker    Quit date: 04/04/1971  . Smokeless tobacco: Never Used  . Alcohol Use: No  . Drug Use: No  . Sexually Active: None   Other Topics Concern  . None    Social History Narrative  . None    Review of Systems:   12 point  review of systems was negative except for those outlined in the HPI.   Objective:  General:  .BP 146/68  Pulse 80  Temp(Src) 97.7 F (36.5 C) (Oral)  Resp 16  Ht 5\' 4"  (1.626 m)  Wt 142 lb 12 oz (64.751 kg)  BMI 24.50 kg/m2  SpO2 96% General appearance: alert, cooperative and appears stated age Back: MILD HYPHOSIS Lungs: clear to auscultation bilaterally Heart: regular rate and rhythm, S1, S2 normal, no murmur, click, rub or gallop Abdomen: soft, non-tender; bowel sounds normal; no masses,  no organomegaly Extremities: extremities normal, atraumatic, no cyanosis or edema  Assessment and Plan: Hyperlipidemia She had stopped her statin for unclear reasons several months ago and repeat lipids were uncontrolled. We have discussed resuming Crestor and she was given samples for 8 weeks with repeat lipids planned  prior to initiation of prescription therapy.  Hypertension Her hypertension is well controlled today. No medication changes were made. Kidney function is normal.  Thyroid disease As well as low dose of levothyroxine. Recent TSH was within normal limits.  Urinary tract infection She is not complaining of dysuria today. UA was not checked since she  often has symptoms to herald the recurrence of infection.  Anemia, pernicious About 5 by prior B12 assessments. She has been forgetful about having her recurrent atrial shots. She was given an injection today and will return on a monthly basis.  Spinal stenosis of lumbar region She underwent a myelogram last week at the order of Dr. Jeral Fruit for evaluation of persistent low back and hip pain complaints accompanied by numbness and bowel and bladder dysfunction. Results are pending. Other than extreme age she is in relatively good physical health. She has no history of ischemic cardiomyopathy and would be considered low to moderate risk at her  age.    Updated Medication List Outpatient Encounter Prescriptions as of 10/13/2011  Medication Sig Dispense Refill  . aspirin 81 MG tablet Take 81 mg by mouth daily.        Marland Kitchen levothyroxine (SYNTHROID, LEVOTHROID) 100 MCG tablet Take 1 tablet (100 mcg total) by mouth daily.  90 tablet  2  . mirtazapine (REMERON) 7.5 MG tablet TAKE ONE TABLET BY MOUTH AT BEDTIME  30 tablet  3  . propranolol (INDERAL) 20 MG tablet Take 1 tablet (20 mg total) by mouth 2 (two) times daily.  180 tablet  3  . DISCONTD: levothyroxine (SYNTHROID, LEVOTHROID) 100 MCG tablet Take 1 tablet (100 mcg total) by mouth daily.  90 tablet  2  . rosuvastatin (CRESTOR) 10 MG tablet Take 1 tablet (10 mg total) by mouth daily.  60 tablet  1  . DISCONTD: esomeprazole (NEXIUM) 40 MG capsule Take 1 capsule (40 mg total) by mouth daily.  30 capsule  6  . DISCONTD: omeprazole (PRILOSEC) 40 MG capsule TAKE ONE CAPSULE BY MOUTH EVERY DAY  90 capsule  3  . DISCONTD: rosuvastatin (CRESTOR) 10 MG tablet Take 10 mg by mouth daily.        Marland Kitchen DISCONTD: temazepam (RESTORIL) 15 MG capsule Take 1 capsule (15 mg total) by mouth at bedtime as needed.  30 capsule  2  . cyanocobalamin ((VITAMIN B-12)) injection 1,000 mcg

## 2011-10-15 NOTE — Assessment & Plan Note (Signed)
Her hypertension is well controlled today. No medication changes were made. Kidney function is normal.

## 2011-10-15 NOTE — Assessment & Plan Note (Signed)
As well as low dose of levothyroxine. Recent TSH was within normal limits.

## 2011-10-15 NOTE — Assessment & Plan Note (Signed)
She underwent a myelogram last week at the order of Dr. Jeral Fruit for evaluation of persistent low back and hip pain complaints accompanied by numbness and bowel and bladder dysfunction. Results are pending. Other than extreme age she is in relatively good physical health. She has no history of ischemic cardiomyopathy and would be considered low to moderate risk at her age.

## 2011-10-15 NOTE — Assessment & Plan Note (Signed)
About 5 by prior B12 assessments. She has been forgetful about having her recurrent atrial shots. She was given an injection today and will return on a monthly basis.

## 2011-10-15 NOTE — Assessment & Plan Note (Signed)
She is not complaining of dysuria today. UA was not checked since she often has symptoms to herald the recurrence of infection.

## 2011-10-30 HISTORY — PX: CYSTOSCOPY: SUR368

## 2011-10-31 ENCOUNTER — Ambulatory Visit: Payer: Self-pay | Admitting: Urology

## 2011-12-01 ENCOUNTER — Telehealth: Payer: Self-pay | Admitting: Internal Medicine

## 2011-12-01 ENCOUNTER — Other Ambulatory Visit: Payer: Self-pay | Admitting: Internal Medicine

## 2011-12-01 MED ORDER — TEMAZEPAM 15 MG PO CAPS: 15.0000 mg | ORAL_CAPSULE | Freq: Every evening | ORAL | Status: DC | PRN

## 2011-12-01 NOTE — Telephone Encounter (Signed)
This is not on patients current med list.  Please advise.

## 2011-12-01 NOTE — Telephone Encounter (Signed)
Ok to refill,  rx on printer

## 2011-12-01 NOTE — Telephone Encounter (Signed)
Refill onTemazepam 15 mg 1 capsule at night bedtime. She is needing a 90 day supply today.

## 2011-12-01 NOTE — Telephone Encounter (Signed)
Rx called in . Patient notified

## 2011-12-22 ENCOUNTER — Telehealth: Payer: Self-pay | Admitting: Internal Medicine

## 2011-12-22 MED ORDER — HYDROCODONE-ACETAMINOPHEN 5-500 MG PO TABS: 1.0000 | ORAL_TABLET | Freq: Three times a day (TID) | ORAL | Status: DC | PRN

## 2011-12-22 NOTE — Telephone Encounter (Signed)
Patient states she would like vicodin called in for back and leg pain, she states that she has had this medication in the past and she uses [facility] on [address].

## 2011-12-22 NOTE — Telephone Encounter (Signed)
Phoned in by Dr. [name] Friday evening.

## 2011-12-26 NOTE — Telephone Encounter (Signed)
Patient informed/SLS  

## 2012-01-01 ENCOUNTER — Telehealth: Payer: Self-pay | Admitting: Internal Medicine

## 2012-01-01 NOTE — Telephone Encounter (Signed)
Caller: Evelyn [name]/Patient; PCP: Duncan Dull; CB#: [phone]; Call regarding Cough/Congestion; Onset 01/01/12. Denies fever.   White and yellow sputum.Emergent sx ruled out.    See in 24 hours per Cough protocol. Caller declined offer of appointment on 01/02/12 @ 0830.  Verified with  Morrie Sheldon at office ; she advised UC. Caller states she plans to go to UC but did not specify which.

## 2012-01-12 ENCOUNTER — Telehealth: Payer: Self-pay | Admitting: Internal Medicine

## 2012-01-12 NOTE — Telephone Encounter (Signed)
Patient has dry mouth ,bitter taste and diarrhea wants to be seen this week  .Can't come on Monday she has another appointment .I asked if she wanted to speak with the triage nurse she stated no she just wanted to see Dr. [name].

## 2012-01-15 ENCOUNTER — Other Ambulatory Visit: Payer: Self-pay | Admitting: Neurosurgery

## 2012-01-15 NOTE — Telephone Encounter (Signed)
Patient has an appt for wed.  Marland Kitchen

## 2012-01-17 ENCOUNTER — Encounter: Payer: Self-pay | Admitting: Internal Medicine

## 2012-01-17 ENCOUNTER — Ambulatory Visit (INDEPENDENT_AMBULATORY_CARE_PROVIDER_SITE_OTHER): Payer: Medicare Other | Admitting: Internal Medicine

## 2012-01-17 ENCOUNTER — Encounter (HOSPITAL_COMMUNITY): Payer: Self-pay | Admitting: Pharmacy Technician

## 2012-01-17 VITALS — BP 110/60 | HR 80 | Temp 97.6°F | Resp 16 | Wt 140.5 lb

## 2012-01-17 DIAGNOSIS — E785 Hyperlipidemia, unspecified: Secondary | ICD-10-CM

## 2012-01-17 DIAGNOSIS — E538 Deficiency of other specified B group vitamins: Secondary | ICD-10-CM

## 2012-01-17 DIAGNOSIS — M48061 Spinal stenosis, lumbar region without neurogenic claudication: Secondary | ICD-10-CM

## 2012-01-17 DIAGNOSIS — D510 Vitamin B12 deficiency anemia due to intrinsic factor deficiency: Secondary | ICD-10-CM

## 2012-01-17 MED ORDER — MIRTAZAPINE 15 MG PO TABS: 15.0000 mg | ORAL_TABLET | Freq: Every day | ORAL | Status: DC

## 2012-01-17 MED ORDER — ROSUVASTATIN CALCIUM 20 MG PO TABS: 20.0000 mg | ORAL_TABLET | Freq: Every day | ORAL | Status: DC

## 2012-01-17 MED ORDER — CYANOCOBALAMIN 1000 MCG/ML IJ SOLN
1000.0000 ug | Freq: Once | INTRAMUSCULAR | Status: AC
  Administered 2012-01-17: 1000 ug via INTRAMUSCULAR

## 2012-01-17 NOTE — Pre-Procedure Instructions (Signed)
20 Evelyn [name]  01/17/2012   Your procedure is scheduled on:  June 26th   Report to [facility] at 0630 AM.  Call this number if you have problems the morning of surgery: [phone]   Remember:   Do not eat food or drink:After Midnight.  Take these medicines the morning of surgery with A SIP OF WATER: propranolol, synthroid   Do not wear jewelry, make-up or nail polish.  Do not wear lotions, powders, or perfumes.   Do not shave 48 hours prior to surgery. Men may shave face and neck.  Do not bring valuables to the hospital.  Contacts, dentures or bridgework may not be worn into surgery.  Leave suitcase in the car. After surgery it may be brought to your room.  For patients admitted to the hospital, checkout time is 11:00 AM the day of discharge.   Patients discharged the day of surgery will not be allowed to drive home.  Special Instructions: CHG Shower Use Special Wash: 1/2 bottle night before surgery and 1/2 bottle morning of surgery.   Please read over the following fact sheets that you were given: Pain Booklet, Coughing and Deep Breathing, MRSA Information and Surgical Site Infection Prevention

## 2012-01-17 NOTE — Patient Instructions (Addendum)
Take 2 of your current mirtazipine at bedtime for a total of 15 mg daily.  To increase your appetite.  I have called a new prescription to [facility] for a 15 mg dose   Resume Crestor for cholesterol and do not stop it again.    Yo need to come back every month for a b12 shot and in 2 months for fasting lipids test.

## 2012-01-17 NOTE — Progress Notes (Signed)
Patient ID: Evelyn [name], female   DOB: [birth year], 78 y.o.   MRN: [id number] Patient Active Problem List  Diagnosis  . Urinary tract infection  . Hypertension  . Hyperlipidemia  . Thyroid disease  . Other chronic cystitis  . History of lymphoma  . Anemia, pernicious  . Spinal stenosis of lumbar region    Subjective:  CC:   Chief Complaint  Patient presents with  . Dry Mouth    HPI:   Evelyn [name] a 78 y.o. female who presents for follow up on chronic conditions, including hyperlipidemia, sciatica, and recurrent urinary tract infections with urinary incontinence.  Received botox injection by Dr Achilles Dunk 4 or 5 weeks ago on bladder and has developed urinary retention requiring her to self catheterize twice daily.  Has had two utis since then treated by Dr. Copes office. At last visit she was given samples of Crestor for LDL 180 and tolerated the samples but ran out several weeks ago without calling for more.  Her back pain and right leg numbness is limiting her ambulation so she has scheduled back surgery by Dr. Jeral Fruit next week for lumbar radiculopathy.  Finally she is reporting dry mouth,  No appetite. Her weight is stable.    Past Medical History  Diagnosis Date  . Hyperlipidemia   . Thyroid disease     had thyroid removed  . Other chronic cystitis     botox injections  . History of lymphoma   . Self-catheterizes urinary bladder     twice daily  . Coronary artery disease   . Coarse tremors     head shakes if does not take propranolol  . Insomnia     takes remeron nightly  . Cancer     lymphomia  . Anemia     hx of blood transfusion    Past Surgical History  Procedure Date  . Cardiac catheterization 2005    2 cardiac stents  . Abdominal hysterectomy 1970s  . Eye surgery     bilateral cataracts  . Joint replacement 2012    right hip  . Stomach surgery     removal all but small portion of stomach out due to ulcers  . Appendectomy   . Back surgery    lower back - bulging disk  . Spine surgery 2008    [place]         The following portions of the patient's history were reviewed and updated as appropriate: Allergies, current medications, and problem list.    Review of Systems:  Remainder of the review of systems was negative except those addressed in the HPI,     History   Social History  . Marital Status: Widowed    Spouse Name: N/A    Number of Children: N/A  . Years of Education: N/A   Occupational History  . Not on file.   Social History Main Topics  . Smoking status: Former Smoker    Quit date: 04/04/1971  . Smokeless tobacco: Never Used  . Alcohol Use: No  . Drug Use: No  . Sexually Active: Not on file   Other Topics Concern  . Not on file   Social History Narrative  . No narrative on file    Objective:  BP 110/60  Pulse 80  Temp 97.6 F (36.4 C) (Oral)  Resp 16  Wt 140 lb 8 oz (63.73 kg)  SpO2 96%  General appearance: alert, cooperative and appears stated age Ears: normal TM's and external  ear canals both ears Throat: lips, mucosa, and tongue normal; teeth and gums normal Neck: no adenopathy, no carotid bruit, supple, symmetrical, trachea midline and thyroid not enlarged, symmetric, no tenderness/mass/nodules Back: symmetric, no curvature. ROM normal. No CVA tenderness. Lungs: clear to auscultation bilaterally Heart: regular rate and rhythm, S1, S2 normal, no murmur, click, rub or gallop Abdomen: soft, non-tender; bowel sounds normal; no masses,  no organomegaly Pulses: 2+ and symmetric Skin: Skin color, texture, turgor normal. No rashes or lesions Lymph nodes: Cervical, supraclavicular, and axillary nodes normal.  Assessment and Plan:  Spinal stenosis of lumbar region With radiculopathy causing right leg numbness and saddle parasthesias.  She is going to have surgery br Dr. Jeral Fruit next month  Anemia, pernicious She is due for her monthly B12 injection.  Hgb is normal per today's labs.    Hyperlipidemia Untreated LDL was 202 in December .  She will Resume crestor and return in 6 weeks for repeat LDL.  Goal is 70 given history of CAD,    Updated Medication List Outpatient Encounter Prescriptions as of 01/17/2012  Medication Sig Dispense Refill  . DISCONTD: levothyroxine (SYNTHROID, LEVOTHROID) 100 MCG tablet Take 1 tablet (100 mcg total) by mouth daily.  90 tablet  2  . DISCONTD: mirtazapine (REMERON) 15 MG tablet Take 1 tablet (15 mg total) by mouth at bedtime.  30 tablet  3  . DISCONTD: mirtazapine (REMERON) 7.5 MG tablet TAKE ONE TABLET BY MOUTH AT BEDTIME  30 tablet  3  . DISCONTD: propranolol (INDERAL) 20 MG tablet Take 1 tablet (20 mg total) by mouth 2 (two) times daily.  180 tablet  3  . DISCONTD: aspirin 81 MG tablet Take 81 mg by mouth daily.        Marland Kitchen DISCONTD: rosuvastatin (CRESTOR) 10 MG tablet Take 1 tablet (10 mg total) by mouth daily.  60 tablet  1  . DISCONTD: rosuvastatin (CRESTOR) 20 MG tablet Take 1 tablet (20 mg total) by mouth daily.  60 tablet  1  . DISCONTD: temazepam (RESTORIL) 15 MG capsule Take 1 capsule (15 mg total) by mouth at bedtime as needed for sleep.  90 capsule  1  . cyanocobalamin ((VITAMIN B-12)) injection 1,000 mcg          No orders of the defined types were placed in this encounter.    Return in about 2 months (around 03/18/2012).

## 2012-01-18 ENCOUNTER — Encounter (HOSPITAL_COMMUNITY)
Admission: RE | Admit: 2012-01-18 | Discharge: 2012-01-18 | Disposition: A | Discharge status: Home / Self Care | Payer: Medicare Other | Source: Ambulatory Visit | Attending: Neurosurgery | Admitting: Neurosurgery

## 2012-01-18 ENCOUNTER — Encounter (HOSPITAL_COMMUNITY): Payer: Self-pay

## 2012-01-18 ENCOUNTER — Ambulatory Visit (HOSPITAL_COMMUNITY)
Admission: RE | Admit: 2012-01-18 | Discharge: 2012-01-18 | Disposition: A | Discharge status: Home / Self Care | Payer: Medicare Other | Source: Ambulatory Visit | Attending: Anesthesiology | Admitting: Anesthesiology

## 2012-01-18 DIAGNOSIS — Z01812 Encounter for preprocedural laboratory examination: Secondary | ICD-10-CM | POA: Insufficient documentation

## 2012-01-18 DIAGNOSIS — Z01818 Encounter for other preprocedural examination: Secondary | ICD-10-CM | POA: Insufficient documentation

## 2012-01-18 HISTORY — DX: Anemia, unspecified: D64.9

## 2012-01-18 HISTORY — DX: Other specified health status: Z78.9

## 2012-01-18 HISTORY — DX: Insomnia, unspecified: G47.00

## 2012-01-18 HISTORY — DX: Atherosclerotic heart disease of native coronary artery without angina pectoris: I25.10

## 2012-01-18 HISTORY — DX: Other specified forms of tremor: G25.2

## 2012-01-18 LAB — CBC
HCT: 37.3 % (ref 36.0–46.0)
MCH: 29.5 pg (ref 26.0–34.0)
MCV: 90.1 fL (ref 78.0–100.0)
Platelets: 352 10*3/uL (ref 150–400)
RDW: 15.5 % (ref 11.5–15.5)

## 2012-01-18 LAB — BASIC METABOLIC PANEL
BUN: 12 mg/dL (ref 6–23)
CO2: 29 mEq/L (ref 19–32)
Calcium: 9.1 mg/dL (ref 8.4–10.5)
Chloride: 96 mEq/L (ref 96–112)
Creatinine, Ser: 0.7 mg/dL (ref 0.50–1.10)
Glucose, Bld: 101 mg/dL — ABNORMAL HIGH (ref 70–99)

## 2012-01-18 NOTE — Progress Notes (Signed)
Primary Physician - Dr. [name]  Cardiologist - Dr. Laruth Bouchard [facility] in [place] Stress test done last week no ekg or echo - -will request records

## 2012-01-20 ENCOUNTER — Encounter: Payer: Self-pay | Admitting: Internal Medicine

## 2012-01-20 NOTE — Assessment & Plan Note (Addendum)
She is due for her monthly B12 injection.  Hgb is normal per today's labs.

## 2012-01-20 NOTE — Assessment & Plan Note (Signed)
With radiculopathy causing right leg numbness and saddle parasthesias.  She is going to have surgery br Dr. Jeral Fruit next month

## 2012-01-20 NOTE — Assessment & Plan Note (Addendum)
Untreated LDL was 202 in December .  She will Resume crestor and return in 6 weeks for repeat LDL.  Goal is 70 given history of CAD,

## 2012-01-23 MED ORDER — VANCOMYCIN HCL IN DEXTROSE 1-5 GM/200ML-% IV SOLN
1000.0000 mg | INTRAVENOUS | Status: AC
  Administered 2012-01-24: 1000 mg via INTRAVENOUS
  Filled 2012-01-23 (×2): qty 200

## 2012-01-24 ENCOUNTER — Encounter (HOSPITAL_COMMUNITY): Payer: Self-pay | Admitting: Anesthesiology

## 2012-01-24 ENCOUNTER — Inpatient Hospital Stay (HOSPITAL_COMMUNITY)
Admission: RE | Admit: 2012-01-24 | Discharge: 2012-01-29 | DRG: 491 | Disposition: A | Discharge status: Skilled Nursing Facility | Payer: Medicare Other | Source: Ambulatory Visit | Attending: Neurosurgery | Admitting: Neurosurgery

## 2012-01-24 ENCOUNTER — Encounter (HOSPITAL_COMMUNITY)
Admission: RE | Disposition: A | Discharge status: Skilled Nursing Facility | Payer: Self-pay | Source: Ambulatory Visit | Attending: Neurosurgery

## 2012-01-24 ENCOUNTER — Ambulatory Visit (HOSPITAL_COMMUNITY): Payer: Medicare Other

## 2012-01-24 ENCOUNTER — Ambulatory Visit (HOSPITAL_COMMUNITY): Payer: Medicare Other | Admitting: Anesthesiology

## 2012-01-24 ENCOUNTER — Encounter (HOSPITAL_COMMUNITY): Payer: Self-pay | Admitting: Surgery

## 2012-01-24 DIAGNOSIS — Z79899 Other long term (current) drug therapy: Secondary | ICD-10-CM

## 2012-01-24 DIAGNOSIS — Z96649 Presence of unspecified artificial hip joint: Secondary | ICD-10-CM

## 2012-01-24 DIAGNOSIS — Z9861 Coronary angioplasty status: Secondary | ICD-10-CM

## 2012-01-24 DIAGNOSIS — M4120 Other idiopathic scoliosis, site unspecified: Principal | ICD-10-CM | POA: Diagnosis present

## 2012-01-24 DIAGNOSIS — E039 Hypothyroidism, unspecified: Secondary | ICD-10-CM | POA: Diagnosis present

## 2012-01-24 DIAGNOSIS — I10 Essential (primary) hypertension: Secondary | ICD-10-CM | POA: Diagnosis present

## 2012-01-24 DIAGNOSIS — D649 Anemia, unspecified: Secondary | ICD-10-CM | POA: Diagnosis present

## 2012-01-24 DIAGNOSIS — I2510 Atherosclerotic heart disease of native coronary artery without angina pectoris: Secondary | ICD-10-CM | POA: Diagnosis present

## 2012-01-24 DIAGNOSIS — Z87898 Personal history of other specified conditions: Secondary | ICD-10-CM

## 2012-01-24 DIAGNOSIS — M48061 Spinal stenosis, lumbar region without neurogenic claudication: Secondary | ICD-10-CM | POA: Diagnosis present

## 2012-01-24 DIAGNOSIS — E785 Hyperlipidemia, unspecified: Secondary | ICD-10-CM | POA: Diagnosis present

## 2012-01-24 DIAGNOSIS — R339 Retention of urine, unspecified: Secondary | ICD-10-CM | POA: Diagnosis present

## 2012-01-24 DIAGNOSIS — G4700 Insomnia, unspecified: Secondary | ICD-10-CM | POA: Diagnosis present

## 2012-01-24 DIAGNOSIS — Z7982 Long term (current) use of aspirin: Secondary | ICD-10-CM

## 2012-01-24 DIAGNOSIS — Z87891 Personal history of nicotine dependence: Secondary | ICD-10-CM

## 2012-01-24 HISTORY — PX: LUMBAR LAMINECTOMY/DECOMPRESSION MICRODISCECTOMY: SHX5026

## 2012-01-24 SURGERY — LUMBAR LAMINECTOMY/DECOMPRESSION MICRODISCECTOMY 2 LEVELS
Anesthesia: General | Site: Back | Laterality: Right | Wound class: Clean

## 2012-01-24 MED ORDER — SODIUM CHLORIDE 0.9 % IJ SOLN: 3.0000 mL | INTRAMUSCULAR | Status: DC | PRN

## 2012-01-24 MED ORDER — HYDROMORPHONE HCL PF 1 MG/ML IJ SOLN
INTRAMUSCULAR | Status: AC
  Filled 2012-01-24: qty 1

## 2012-01-24 MED ORDER — LEVOTHYROXINE SODIUM 100 MCG PO TABS
100.0000 ug | ORAL_TABLET | Freq: Every day | ORAL | Status: DC
  Administered 2012-01-25 – 2012-01-29 (×5): 100 ug via ORAL
  Filled 2012-01-24 (×5): qty 1

## 2012-01-24 MED ORDER — PROPRANOLOL HCL 20 MG PO TABS
20.0000 mg | ORAL_TABLET | Freq: Two times a day (BID) | ORAL | Status: DC
  Administered 2012-01-25 – 2012-01-29 (×9): 20 mg via ORAL
  Filled 2012-01-24 (×11): qty 1

## 2012-01-24 MED ORDER — DIPHENHYDRAMINE HCL 50 MG/ML IJ SOLN: 12.5000 mg | Freq: Four times a day (QID) | INTRAMUSCULAR | Status: DC | PRN

## 2012-01-24 MED ORDER — PROPOFOL 10 MG/ML IV EMUL
INTRAVENOUS | Status: DC | PRN
  Administered 2012-01-24: 80 mg via INTRAVENOUS

## 2012-01-24 MED ORDER — FENTANYL CITRATE 0.05 MG/ML IJ SOLN
INTRAMUSCULAR | Status: DC | PRN
  Administered 2012-01-24 (×2): 25 ug via INTRAVENOUS
  Administered 2012-01-24: 50 ug via INTRAVENOUS
  Administered 2012-01-24: 100 ug via INTRAVENOUS

## 2012-01-24 MED ORDER — NALOXONE HCL 0.4 MG/ML IJ SOLN: 0.4000 mg | INTRAMUSCULAR | Status: DC | PRN

## 2012-01-24 MED ORDER — SODIUM CHLORIDE 0.9 % IV SOLN
INTRAVENOUS | Status: DC
  Administered 2012-01-24: 75 mL/h via INTRAVENOUS

## 2012-01-24 MED ORDER — PHENOL 1.4 % MT LIQD: 1.0000 | OROMUCOSAL | Status: DC | PRN

## 2012-01-24 MED ORDER — ONDANSETRON HCL 4 MG/2ML IJ SOLN: 4.0000 mg | INTRAMUSCULAR | Status: DC | PRN

## 2012-01-24 MED ORDER — ONDANSETRON HCL 4 MG/2ML IJ SOLN: 4.0000 mg | Freq: Four times a day (QID) | INTRAMUSCULAR | Status: DC | PRN

## 2012-01-24 MED ORDER — ACETAMINOPHEN 325 MG PO TABS
650.0000 mg | ORAL_TABLET | ORAL | Status: DC | PRN
  Administered 2012-01-28: 650 mg via ORAL
  Filled 2012-01-24: qty 2

## 2012-01-24 MED ORDER — SODIUM CHLORIDE 0.9 % IJ SOLN
3.0000 mL | Freq: Two times a day (BID) | INTRAMUSCULAR | Status: DC
  Administered 2012-01-25 – 2012-01-29 (×3): 3 mL via INTRAVENOUS

## 2012-01-24 MED ORDER — 0.9 % SODIUM CHLORIDE (POUR BTL) OPTIME
TOPICAL | Status: DC | PRN
  Administered 2012-01-24: 1000 mL

## 2012-01-24 MED ORDER — ROCURONIUM BROMIDE 100 MG/10ML IV SOLN
INTRAVENOUS | Status: DC | PRN
  Administered 2012-01-24: 50 mg via INTRAVENOUS

## 2012-01-24 MED ORDER — HYDROMORPHONE HCL PF 1 MG/ML IJ SOLN
0.5000 mg | INTRAMUSCULAR | Status: DC | PRN
  Administered 2012-01-24: 0.5 mg via INTRAVENOUS

## 2012-01-24 MED ORDER — MIRTAZAPINE 7.5 MG PO TABS
7.5000 mg | ORAL_TABLET | Freq: Every day | ORAL | Status: DC
  Administered 2012-01-25 – 2012-01-28 (×4): 7.5 mg via ORAL
  Filled 2012-01-24 (×6): qty 1

## 2012-01-24 MED ORDER — DIAZEPAM 5 MG PO TABS
5.0000 mg | ORAL_TABLET | Freq: Four times a day (QID) | ORAL | Status: DC | PRN
  Administered 2012-01-24 – 2012-01-26 (×6): 5 mg via ORAL
  Filled 2012-01-24 (×7): qty 1

## 2012-01-24 MED ORDER — ATORVASTATIN CALCIUM 10 MG PO TABS
10.0000 mg | ORAL_TABLET | Freq: Every day | ORAL | Status: DC
  Administered 2012-01-24 – 2012-01-28 (×5): 10 mg via ORAL
  Filled 2012-01-24 (×6): qty 1

## 2012-01-24 MED ORDER — LIDOCAINE HCL (CARDIAC) 20 MG/ML IV SOLN
INTRAVENOUS | Status: DC | PRN
  Administered 2012-01-24: 80 mg via INTRAVENOUS

## 2012-01-24 MED ORDER — HYDROMORPHONE HCL PF 1 MG/ML IJ SOLN
0.2500 mg | INTRAMUSCULAR | Status: DC | PRN
  Administered 2012-01-24 (×4): 0.5 mg via INTRAVENOUS

## 2012-01-24 MED ORDER — SODIUM CHLORIDE 0.9 % IJ SOLN: 9.0000 mL | INTRAMUSCULAR | Status: DC | PRN

## 2012-01-24 MED ORDER — GLYCOPYRROLATE 0.2 MG/ML IJ SOLN
INTRAMUSCULAR | Status: DC | PRN
  Administered 2012-01-24: 0.4 mg via INTRAVENOUS

## 2012-01-24 MED ORDER — DIPHENHYDRAMINE HCL 12.5 MG/5ML PO ELIX
12.5000 mg | ORAL_SOLUTION | Freq: Four times a day (QID) | ORAL | Status: DC | PRN
  Administered 2012-01-26: 12.5 mg via ORAL
  Filled 2012-01-24: qty 10

## 2012-01-24 MED ORDER — SODIUM CHLORIDE 0.9 % IV SOLN: 250.0000 mL | INTRAVENOUS | Status: DC

## 2012-01-24 MED ORDER — THROMBIN 5000 UNITS EX SOLR
CUTANEOUS | Status: DC | PRN
  Administered 2012-01-24 (×2): 5000 [IU] via TOPICAL

## 2012-01-24 MED ORDER — NEOSTIGMINE METHYLSULFATE 1 MG/ML IJ SOLN
INTRAMUSCULAR | Status: DC | PRN
  Administered 2012-01-24: 3 mg via INTRAVENOUS

## 2012-01-24 MED ORDER — LACTATED RINGERS IV SOLN
INTRAVENOUS | Status: DC | PRN
  Administered 2012-01-24 (×2): via INTRAVENOUS

## 2012-01-24 MED ORDER — HYDROMORPHONE HCL PF 1 MG/ML IJ SOLN
INTRAMUSCULAR | Status: AC
  Administered 2012-01-24: 0.5 mg via INTRAVENOUS
  Filled 2012-01-24: qty 1

## 2012-01-24 MED ORDER — ACETAMINOPHEN 650 MG RE SUPP: 650.0000 mg | RECTAL | Status: DC | PRN

## 2012-01-24 MED ORDER — ZOLPIDEM TARTRATE 5 MG PO TABS: 5.0000 mg | ORAL_TABLET | Freq: Every evening | ORAL | Status: DC | PRN

## 2012-01-24 MED ORDER — MORPHINE SULFATE 2 MG/ML IJ SOLN
1.0000 mg | INTRAMUSCULAR | Status: DC | PRN
  Administered 2012-01-25: 1 mg via INTRAVENOUS
  Filled 2012-01-24 (×2): qty 1

## 2012-01-24 MED ORDER — HEMOSTATIC AGENTS (NO CHARGE) OPTIME
TOPICAL | Status: DC | PRN
  Administered 2012-01-24: 1 via TOPICAL

## 2012-01-24 MED ORDER — EPHEDRINE SULFATE 50 MG/ML IJ SOLN
INTRAMUSCULAR | Status: DC | PRN
  Administered 2012-01-24: 10 mg via INTRAVENOUS

## 2012-01-24 MED ORDER — MENTHOL 3 MG MT LOZG: 1.0000 | LOZENGE | OROMUCOSAL | Status: DC | PRN

## 2012-01-24 MED ORDER — ONDANSETRON HCL 4 MG/2ML IJ SOLN
INTRAMUSCULAR | Status: DC | PRN
  Administered 2012-01-24: 4 mg via INTRAVENOUS

## 2012-01-24 MED ORDER — ONDANSETRON HCL 4 MG/2ML IJ SOLN: 4.0000 mg | Freq: Once | INTRAMUSCULAR | Status: DC | PRN

## 2012-01-24 MED ORDER — VANCOMYCIN HCL 1000 MG IV SOLR
750.0000 mg | INTRAVENOUS | Status: DC
  Administered 2012-01-25 – 2012-01-26 (×2): 750 mg via INTRAVENOUS
  Filled 2012-01-24 (×2): qty 750

## 2012-01-24 MED ORDER — OXYCODONE-ACETAMINOPHEN 5-325 MG PO TABS
1.0000 | ORAL_TABLET | ORAL | Status: DC | PRN
  Administered 2012-01-24 – 2012-01-25 (×4): 2 via ORAL
  Administered 2012-01-25: 1 via ORAL
  Administered 2012-01-25: 2 via ORAL
  Administered 2012-01-25: 1 via ORAL
  Administered 2012-01-26 (×2): 2 via ORAL
  Filled 2012-01-24 (×8): qty 2

## 2012-01-24 SURGICAL SUPPLY — 55 items
BENZOIN TINCTURE PRP APPL 2/3 (GAUZE/BANDAGES/DRESSINGS) ×2 IMPLANT
BLADE SURG ROTATE 9660 (MISCELLANEOUS) IMPLANT
BUR ACORN 6.0 (BURR) IMPLANT
BUR MATCHSTICK NEURO 3.0 LAGG (BURR) ×2 IMPLANT
CANISTER SUCTION 2500CC (MISCELLANEOUS) ×2 IMPLANT
CLOTH BEACON ORANGE TIMEOUT ST (SAFETY) ×2 IMPLANT
CONT SPEC 4OZ CLIKSEAL STRL BL (MISCELLANEOUS) ×2 IMPLANT
DRAPE LAPAROTOMY 100X72X124 (DRAPES) ×2 IMPLANT
DRAPE MICROSCOPE LEICA (MISCELLANEOUS) ×2 IMPLANT
DRAPE POUCH INSTRU U-SHP 10X18 (DRAPES) ×2 IMPLANT
DRSG PAD ABDOMINAL 8X10 ST (GAUZE/BANDAGES/DRESSINGS) IMPLANT
DURAPREP 26ML APPLICATOR (WOUND CARE) ×2 IMPLANT
ELECT REM PT RETURN 9FT ADLT (ELECTROSURGICAL) ×2
ELECTRODE REM PT RTRN 9FT ADLT (ELECTROSURGICAL) ×1 IMPLANT
GAUZE SPONGE 4X4 16PLY XRAY LF (GAUZE/BANDAGES/DRESSINGS) ×2 IMPLANT
GLOVE BIO SURGEON STRL SZ8 (GLOVE) ×2 IMPLANT
GLOVE BIOGEL M 8.0 STRL (GLOVE) ×2 IMPLANT
GLOVE BIOGEL PI IND STRL 8 (GLOVE) ×1 IMPLANT
GLOVE BIOGEL PI INDICATOR 8 (GLOVE) ×1
GLOVE ECLIPSE 7.5 STRL STRAW (GLOVE) ×6 IMPLANT
GLOVE EXAM NITRILE LRG STRL (GLOVE) IMPLANT
GLOVE EXAM NITRILE MD LF STRL (GLOVE) IMPLANT
GLOVE EXAM NITRILE XL STR (GLOVE) IMPLANT
GLOVE EXAM NITRILE XS STR PU (GLOVE) IMPLANT
GOWN BRE IMP SLV AUR LG STRL (GOWN DISPOSABLE) ×2 IMPLANT
GOWN BRE IMP SLV AUR XL STRL (GOWN DISPOSABLE) ×2 IMPLANT
GOWN STRL REIN 2XL LVL4 (GOWN DISPOSABLE) ×4 IMPLANT
KIT BASIN OR (CUSTOM PROCEDURE TRAY) ×2 IMPLANT
KIT ROOM TURNOVER OR (KITS) ×2 IMPLANT
NEEDLE HYPO 18GX1.5 BLUNT FILL (NEEDLE) IMPLANT
NEEDLE HYPO 21X1.5 SAFETY (NEEDLE) IMPLANT
NEEDLE HYPO 25X1 1.5 SAFETY (NEEDLE) ×2 IMPLANT
NEEDLE SPNL 20GX3.5 QUINCKE YW (NEEDLE) ×2 IMPLANT
NS IRRIG 1000ML POUR BTL (IV SOLUTION) ×2 IMPLANT
PACK LAMINECTOMY NEURO (CUSTOM PROCEDURE TRAY) ×2 IMPLANT
PAD ARMBOARD 7.5X6 YLW CONV (MISCELLANEOUS) ×6 IMPLANT
PATTIES SURGICAL .5 X1 (DISPOSABLE) ×2 IMPLANT
RUBBERBAND STERILE (MISCELLANEOUS) ×4 IMPLANT
SPONGE GAUZE 4X4 12PLY (GAUZE/BANDAGES/DRESSINGS) ×2 IMPLANT
SPONGE LAP 4X18 X RAY DECT (DISPOSABLE) IMPLANT
SPONGE SURGIFOAM ABS GEL SZ50 (HEMOSTASIS) ×2 IMPLANT
STAPLER SKIN PROX WIDE 3.9 (STAPLE) ×2 IMPLANT
STRIP CLOSURE SKIN 1/2X4 (GAUZE/BANDAGES/DRESSINGS) ×2 IMPLANT
SUT PROLENE 6 0 BV (SUTURE) ×4 IMPLANT
SUT VIC AB 0 CT1 18XCR BRD8 (SUTURE) ×1 IMPLANT
SUT VIC AB 0 CT1 8-18 (SUTURE) ×1
SUT VIC AB 2-0 CP2 18 (SUTURE) ×2 IMPLANT
SUT VIC AB 3-0 SH 8-18 (SUTURE) ×2 IMPLANT
SYR 20CC LL (SYRINGE) IMPLANT
SYR 20ML ECCENTRIC (SYRINGE) ×2 IMPLANT
SYR 5ML LL (SYRINGE) IMPLANT
TAPE CLOTH SURG 4X10 WHT LF (GAUZE/BANDAGES/DRESSINGS) ×2 IMPLANT
TOWEL OR 17X24 6PK STRL BLUE (TOWEL DISPOSABLE) ×2 IMPLANT
TOWEL OR 17X26 10 PK STRL BLUE (TOWEL DISPOSABLE) ×2 IMPLANT
WATER STERILE IRR 1000ML POUR (IV SOLUTION) ×2 IMPLANT

## 2012-01-24 NOTE — Anesthesia Postprocedure Evaluation (Signed)
  Anesthesia Post-op Note  Patient: Evelyn [name]  Procedure(s) Performed: Procedure(s) (LRB): LUMBAR LAMINECTOMY/DECOMPRESSION MICRODISCECTOMY 2 LEVELS (Right)  Patient Location: PACU  Anesthesia Type: General  Level of Consciousness: awake  Airway and Oxygen Therapy: Patient Spontanous Breathing and Patient connected to nasal cannula oxygen  Post-op Pain: mild  Post-op Assessment: Post-op Vital signs reviewed  Post-op Vital Signs: Reviewed  Complications: No apparent anesthesia complications

## 2012-01-24 NOTE — Transfer of Care (Signed)
Immediate Anesthesia Transfer of Care Note  Patient: Evelyn [name]  Procedure(s) Performed: Procedure(s) (LRB): LUMBAR LAMINECTOMY/DECOMPRESSION MICRODISCECTOMY 2 LEVELS (Right)  Patient Location: PACU  Anesthesia Type: General  Level of Consciousness: awake, alert  and oriented  Airway & Oxygen Therapy: Patient Spontanous Breathing and Patient connected to face mask oxygen  Post-op Assessment: Report given to PACU RN  Post vital signs: Reviewed and stable  Complications: No apparent anesthesia complications

## 2012-01-24 NOTE — Preoperative (Signed)
Beta Blockers   Reason not to administer Beta Blockers:Not Applicable 

## 2012-01-24 NOTE — Progress Notes (Signed)
Dr. Ivin Booty at bedside to see pt. Still groaning, will not follow commands or make eye contact

## 2012-01-24 NOTE — Progress Notes (Signed)
Pt. Groaning, will not answer questions or make eye contact, griamacing, DIlaudid given

## 2012-01-24 NOTE — Progress Notes (Signed)
Op note [phone]

## 2012-01-24 NOTE — Progress Notes (Signed)
ANTIBIOTIC CONSULT NOTE - INITIAL  Pharmacy Consult for vancomycin Indication: surgical prophylaxis  Allergies  Allergen Reactions  . Ciprofloxacin Swelling and Rash  . Penicillins Swelling and Rash  . Prednisone Rash  . Sulfa Antibiotics Swelling and Rash    Vital Signs: Temp: 97.4 F (36.3 C) (06/26 1400) Temp src: Oral (06/26 0704) BP: 143/65 mmHg (06/26 1400) Pulse Rate: 58  (06/26 1400) Intake/Output from this shift: Total I/O In: 1200 [I.V.:1200] Out: 50 [Blood:50]  Labs: No results found for this basename: WBC:3,HGB:3,PLT:3,LABCREA:3,CREATININE:3 in the last 72 hours The CrCl is unknown because both a height and weight (above a minimum accepted value) are required for this calculation. No results found for this basename: VANCOTROUGH:2,VANCOPEAK:2,VANCORANDOM:2,GENTTROUGH:2,GENTPEAK:2,GENTRANDOM:2,TOBRATROUGH:2,TOBRAPEAK:2,TOBRARND:2,AMIKACINPEAK:2,AMIKACINTROU:2,AMIKACIN:2, in the last 72 hours   Microbiology: Recent Results (from the past 720 hour(s))  SURGICAL PCR SCREEN     Status: Abnormal   Collection Time   01/18/12  2:01 PM      Component Value Range Status Comment   MRSA, PCR POSITIVE (*) NEGATIVE Final    Staphylococcus aureus POSITIVE (*) NEGATIVE Final     Medical History: Past Medical History  Diagnosis Date  . Hyperlipidemia   . Thyroid disease     had thyroid removed  . Other chronic cystitis     botox injections  . History of lymphoma   . Self-catheterizes urinary bladder     twice daily  . Coronary artery disease   . Coarse tremors     head shakes if does not take propranolol  . Insomnia     takes remeron nightly  . Cancer     lymphomia  . Anemia     hx of blood transfusion  Assessment: 78 year old female s/p lumbar laminectomy/decompression, received orders to start IV vancomycin for post-op surgical prophylaxis given pcn allergy. Renal function is normal but given advanced age will need some dose adjustment.  Goal of Therapy:    Vancomycin trough 10-15  Plan:  Vancomycin 750mg  IV q 24 hours - start tomorrow 6/27  Severiano Gilbert 01/24/2012,2:55 PM

## 2012-01-24 NOTE — Progress Notes (Signed)
Resting. C/o incisional pain. Spoke with family.

## 2012-01-24 NOTE — Anesthesia Preprocedure Evaluation (Addendum)
Anesthesia Evaluation  Patient identified by MRN, date of birth, ID band Patient awake    Reviewed: Allergy & Precautions, H&P , NPO status , Patient's Chart, lab work & pertinent test results, reviewed documented beta blocker date and time   Airway Mallampati: II TM Distance: >3 FB Neck ROM: Full    Dental  (+) Teeth Intact and Dental Advisory Given   Pulmonary  breath sounds clear to auscultation  Pulmonary exam normal       Cardiovascular hypertension, Pt. on home beta blockers and Pt. on medications + CAD and + Cardiac Stents Rhythm:Regular Rate:Normal     Neuro/Psych    GI/Hepatic   Endo/Other  Hypothyroidism   Renal/GU      Musculoskeletal   Abdominal Normal abdominal exam  (+)   Peds  Hematology   Anesthesia Other Findings   Reproductive/Obstetrics                      Anesthesia Physical Anesthesia Plan  ASA: IV  Anesthesia Plan: General   Post-op Pain Management:    Induction: Intravenous  Airway Management Planned: Oral ETT  Additional Equipment:   Intra-op Plan:   Post-operative Plan: Extubation in OR  Informed Consent: I have reviewed the patients History and Physical, chart, labs and discussed the procedure including the risks, benefits and alternatives for the proposed anesthesia with the patient or authorized representative who has indicated his/her understanding and acceptance.   Dental advisory given  Plan Discussed with: CRNA, Anesthesiologist and Surgeon  Anesthesia Plan Comments:        Anesthesia Quick Evaluation

## 2012-01-24 NOTE — H&P (Signed)
Evelyn [name] is an 78 y.o. female.   Chief Complaint: lbp HPI: *lbp with radiation to the right leg no better with conservtaive treatment including epidurals and medications  Past Medical History  Diagnosis Date  . Hyperlipidemia   . Thyroid disease     had thyroid removed  . Other chronic cystitis     botox injections  . History of lymphoma   . Self-catheterizes urinary bladder     twice daily  . Coronary artery disease   . Coarse tremors     head shakes if does not take propranolol  . Insomnia     takes remeron nightly  . Cancer     lymphomia  . Anemia     hx of blood transfusion    Past Surgical History  Procedure Date  . Cardiac catheterization 2005    2 cardiac stents  . Abdominal hysterectomy 1970s  . Eye surgery     bilateral cataracts  . Joint replacement 2012    right hip  . Stomach surgery     removal all but small portion of stomach out due to ulcers  . Appendectomy   . Back surgery     lower back - bulging disk  . Spine surgery 2008    Evelyn [name]    Family History  Problem Relation Age of Onset  . Diabetes Mother   . Heart disease Father    Social History:  reports that she quit smoking about 40 years ago. She has never used smokeless tobacco. She reports that she does not drink alcohol or use illicit drugs.  Allergies:  Allergies  Allergen Reactions  . Ciprofloxacin Swelling and Rash  . Penicillins Swelling and Rash  . Prednisone Rash  . Sulfa Antibiotics Swelling and Rash    Medications Prior to Admission  Medication Sig Dispense Refill  . levothyroxine (SYNTHROID, LEVOTHROID) 100 MCG tablet Take 100 mcg by mouth daily.      . mirtazapine (REMERON) 7.5 MG tablet Take 7.5 mg by mouth at bedtime.      . propranolol (INDERAL) 20 MG tablet Take 20 mg by mouth 2 (two) times daily.      . rosuvastatin (CRESTOR) 20 MG tablet Take 20 mg by mouth daily.      . temazepam (RESTORIL) 15 MG capsule Take 15 mg by mouth at bedtime.      Marland Kitchen aspirin EC  81 MG tablet Take 81 mg by mouth daily.        No results found for this or any previous visit (from the past 48 hour(s)). No results found.  Review of Systems  Constitutional: Negative.   HENT: Negative.   Respiratory: Negative.   Cardiovascular: Negative.   Gastrointestinal: Negative.   Genitourinary: Negative.   Musculoskeletal: Positive for back pain.  Skin: Negative.   Neurological: Positive for focal weakness.  Endo/Heme/Allergies: Negative.   Psychiatric/Behavioral: Negative.     Blood pressure 119/74, pulse 66, temperature 98.1 F (36.7 C), temperature source Oral, resp. rate 18, SpO2 95.00%. Physical Exam hent, hard of hearing. Neck, nl, cv nl, lungs, nl abdomen, nl. Extremities, scar in right hip from previous surgery NEURO MILD  Weakness of rihgt foot in df and pf. xrays showed ddd with stenisis at right 45 and 51 Assessment/Plan     Decompression at 45and 51 in the right. She and a friend are aware of riks and benefits.  Evelyn [name] M 01/24/2012, 8:58 AM

## 2012-01-25 DIAGNOSIS — M47817 Spondylosis without myelopathy or radiculopathy, lumbosacral region: Secondary | ICD-10-CM

## 2012-01-25 DIAGNOSIS — IMO0002 Reserved for concepts with insufficient information to code with codable children: Secondary | ICD-10-CM

## 2012-01-25 MED ORDER — GABAPENTIN 100 MG PO CAPS
100.0000 mg | ORAL_CAPSULE | Freq: Three times a day (TID) | ORAL | Status: DC
  Administered 2012-01-25 – 2012-01-29 (×11): 100 mg via ORAL
  Filled 2012-01-25 (×14): qty 1

## 2012-01-25 NOTE — Progress Notes (Signed)
Utilization review completed.  

## 2012-01-25 NOTE — Progress Notes (Signed)
OT Cancellation Note  Treatment cancelled today due to pain 10/10 and pt. reports just back to bed.  Will re-attempt 01/26/12.  Jeani Hawking M [phone] 01/25/2012, 3:17 PM

## 2012-01-25 NOTE — Op Note (Signed)
[name], Evelyn [name] NO.:  [id number]  MEDICAL RECORD NO.:  [id number]  LOCATION:  3038                         FACILITY:  [facility]  PHYSICIAN:  Hilda Lias, M.D.   DATE OF BIRTH:  1920-04-06  DATE OF PROCEDURE:  01/24/2012 DATE OF DISCHARGE:                              OPERATIVE REPORT   PREOPERATIVE DIAGNOSES:  Scoliosis and chronic radiculopathy, status post decompression at the level of L4-5, L5-S1.  History of hip replacement, right side.  POSTOPERATIVE DIAGNOSES:  Scoliosis and chronic radiculopathy, status post decompression at the level of L4-5, L5-S1.  History of hip replacement, right side.  PROCEDURE:  Right L4-5 foraminotomy, lysis of adhesion, decompression of the L5 and S1 nerve root.  Microscope.  SURGEON:  Hilda Lias, M.D.  ASSISTANT:  Tia Alert, M.D.  CLINICAL HISTORY:  This lady in the past underwent foraminotomy to decompress the L4 and L5 nerve root.  She has a bad case of scoliosis. She is 78 years old.  A few months ago, she has a hip replacement and since then, she had been complaining of worsening pain down to the right leg.  She had failed with conservative treatment including injection.  X- rays showed that she has scoliosis with chronic stenosis/fibrosis at the level of 4-5, 5-1.  The patient wants to proceed with surgery. She wants asap__________.  We talked about the risk with the surgery one would be no improvement whatsoever, infection, CSF leak, need for further surgery.  PROCEDURE:  The patient was taken to the OR.  After intubation, she was positioned in a prone manner.  The skin was cleaned with DuraPrep.  A midline incision following the previous one was made through the skin, subcutaneous tissue, straight down to the cervical spine.  Indeed, at the level of 4-5, there was no anatomy whatsoever.  We had x-ray, which showed, we were at the level of 5-1.  From then on, with the help of the microscope, we  started drilling the medial facet of L5 just to be able to get into the thecal sac.  We had quite a bit of adhesion and released.  We were able to see the S1 nerve root.  This was cleaned. The L5 nerve root was fibrotic and redish__________.  The patient had quite a bit of narrowing the area.  The drill was accomplished and we were able to decompress the L5 nerve root.  __l4________ looked normal and L5 looked absolutely abnormal with swelling of the soft tissue.  From then on, _we had an arachnoid pouch and it was_________ L4-5 closed with a single stitch of Prolene.  Valsalva at 2:50 was negative.  From then on, the area was irrigated and the wound was closed with Vicryl and staples.          ______________________________ Hilda Lias, M.D.     EB/MEDQ  D:  01/24/2012  T:  01/24/2012  Job:  [id number]

## 2012-01-25 NOTE — Progress Notes (Signed)
Patient ID: Evelyn [name], female   DOB: August 11, 1919, 78 y.o.   MRN: [id number] C/o incisional pain and numbness as preop. Normal strenght . Rehab to see.

## 2012-01-25 NOTE — Evaluation (Addendum)
Physical Therapy Evaluation Patient Details Name: Evelyn [name] MRN: [id number] DOB: [birth year] Today's Date: 01/25/2012 Time: 4098-1191 PT Time Calculation (min): 24 min  PT Assessment / Plan / Recommendation Clinical Impression  Pt is 78 y/o female admitted for s/p Right L4-5 foraminotomy, lysis of adhesion, decompression of the L5 and S1 nerve root.  Pt will benefit from acute PT services to improve overall mobility and prepare for safe d/c     PT Assessment  Patient needs continued PT services    Follow Up Recommendations  Skilled nursing facility    Barriers to Discharge        Equipment Recommendations  Defer to next venue    Recommendations for Other Services     Frequency Min 5X/week    Precautions / Restrictions Precautions Precautions: Back Precaution Booklet Issued: Yes (comment) Precaution Comments: back Required Braces or Orthoses: Spinal Brace Spinal Brace: Lumbar corset;Applied in sitting position   Pertinent Vitals/Pain No c/o pain      Mobility  Bed Mobility Bed Mobility: Rolling Left;Left Sidelying to Sit Rolling Left: 4: Min assist;With rail Left Sidelying to Sit: 4: Min assist Details for Bed Mobility Assistance: (A) to initiate transfer with cues for proper technique to prevent twisting. Transfers Transfers: Sit to Stand;Stand to Sit Sit to Stand: 4: Min assist;From bed Stand to Sit: 4: Min assist;To chair/3-in-1 Details for Transfer Assistance: (A) to initiate transfer and slowly descend to chair with cues for hand placement Ambulation/Gait Ambulation/Gait Assistance: 4: Min guard Ambulation Distance (Feet): 30 Feet Assistive device: Rolling walker Ambulation/Gait Assistance Details: minguard for safety with cues for hand placement and body position within RW. Gait Pattern: Step-through pattern;Decreased stride length;Wide base of support;Decreased trunk rotation    Exercises     PT Diagnosis: Difficulty walking;Abnormality of  gait;Generalized weakness;Acute pain  PT Problem List: Decreased strength;Decreased activity tolerance;Decreased balance;Decreased mobility;Decreased coordination;Decreased knowledge of use of DME;Decreased knowledge of precautions;Pain PT Treatment Interventions: DME instruction;Gait training;Stair training;Functional mobility training;Therapeutic activities;Therapeutic exercise;Balance training;Neuromuscular re-education;Patient/family education   PT Goals Acute Rehab PT Goals PT Goal Formulation: With patient Time For Goal Achievement: 02/01/12 Potential to Achieve Goals: Good Pt will Roll Supine to Left Side: with supervision PT Goal: Rolling Supine to Left Side - Progress: Goal set today Pt will go Supine/Side to Sit: with supervision PT Goal: Supine/Side to Sit - Progress: Goal set today Pt will go Sit to Supine/Side: with supervision PT Goal: Sit to Supine/Side - Progress: Goal set today Pt will go Sit to Stand: with supervision PT Goal: Sit to Stand - Progress: Goal set today Pt will go Stand to Sit: with supervision PT Goal: Stand to Sit - Progress: Goal set today Pt will Ambulate: >150 feet;with supervision;with rolling walker PT Goal: Ambulate - Progress: Goal set today Additional Goals Additional Goal #1: Pt able to recall and adhere to 3/3 back precautions. PT Goal: Additional Goal #1 - Progress: Goal set today  Visit Information  Last PT Received On: 01/25/12 Assistance Needed: +1    Subjective Data  Subjective: "Am I going to be able to get up today,." Patient Stated Goal: To go to [name]   Prior Functioning  Home Living Lives With: Alone Type of Home: House Home Access: Stairs to enter [employer] of Steps: 2 Home Layout: One level Bathroom Shower/Tub: Engineer, manufacturing systems: Standard Home Adaptive Equipment: Walker - rolling;Tub transfer bench Prior Function Level of Independence: Needs assistance Needs Assistance: Light  Housekeeping Light Housekeeping: Total Driving: Yes Vocation: Retired Musician:  No difficulties    Cognition  Overall Cognitive Status: Appears within functional limits for tasks assessed/performed Arousal/Alertness: Awake/alert Orientation Level: Appears intact for tasks assessed Behavior During Session: Evelyn [name] for tasks performed    Extremity/Trunk Assessment Right Lower Extremity Assessment RLE ROM/Strength/Tone: Within functional levels RLE Sensation: Deficits RLE Sensation Deficits: impaired light touch Left Lower Extremity Assessment LLE ROM/Strength/Tone: Within functional levels LLE Sensation: WFL - Light Touch   Balance Balance Balance Assessed: Yes Static Sitting Balance Static Sitting - Balance Support: Feet supported Static Sitting - Level of Assistance: 5: Stand by assistance  End of Session PT - End of Session Equipment Utilized During Treatment: Gait belt Activity Tolerance: Patient tolerated treatment well Patient left: in chair;with call bell/phone within reach Nurse Communication: Mobility status  GP     Katurah Karapetian 01/25/2012, 3:03 PM Jake Shark, PT DPT [phone]

## 2012-01-25 NOTE — Clinical Social Work Psychosocial (Signed)
     Clinical Social Work Department BRIEF PSYCHOSOCIAL ASSESSMENT 01/25/2012  Patient:  Evelyn [name], Evelyn [name]     Account Number:  [id number]     Admit date:  01/24/2012  Clinical Social Worker:  Peggyann Shoals  Date/Time:  01/25/2012 03:59 PM  Referred by:  Physician  Date Referred:  01/25/2012 Referred for  SNF Placement   Other Referral:   Interview type:  Patient Other interview type:    PSYCHOSOCIAL DATA Living Status:  ALONE Admitted from facility:   Level of care:   Primary support name:  Evelyn [name] Primary support relationship to patient:  CHILD, ADULT Degree of support available:   Adequate    CURRENT CONCERNS Current Concerns  Post-Acute Placement   Other Concerns:    SOCIAL WORK ASSESSMENT / PLAN CSW met with pt to address consult. PT is recommending SNF. CSW introduced herself and explained role of social work. Pt shared that she lives alone and her family lives close by however are not able to provide care due to work. Pt shared that she has been to [facility] in the past and is agreeable to return. Pt reported that she has contacted the facility and confirmed that they have a bed available.    CSW will initiate SNF search and follow up with bed offers. CSW will also call [facility] and inquire about bed availability. CSW will continue to follow.   Assessment/plan status:  Psychosocial Support/Ongoing Assessment of Needs Other assessment/ plan:   Information/referral to community resources:   SNF List    PATIENTS/FAMILYS RESPONSE TO PLAN OF CARE: Pt was pleasant even though she was in a great deal of pain. Pt was alert and oriented. Pt is agreeable to dishcarge plan.

## 2012-01-25 NOTE — Consult Note (Signed)
Physical Medicine and Rehabilitation Consult Reason for Consult: Lumbar stenosis with radiculopathy Referring Physician: Dr. Jeral Fruit   HPI: Evelyn [name] is a 78 y.o. right-handed female with history of back surgery in the past as well as neurogenic bladder who was admitted 01/24/2012 with low back pain radiating to the right lower extremity. MRI and imaging revealed lumbar stenosis with radiculopathy L4-5 and L5-S1. No relief with conservative care. Underwent decompression/laminectomy L4-5 and L5-S1 01/24/2012 per Dr. Jeral Fruit. Postoperative pain management. No current order for back brace. Patient does have hip precautions for history of hip replacement. Contact precautions for MRSA nasal nares. Physical and occupational therapy pending. M.D. is requested physical medicine rehabilitation consult to consider inpatient rehabilitation services   Review of Systems  Genitourinary: Positive for urgency.  Musculoskeletal: Positive for back pain.  Neurological: Positive for tremors and weakness.  Psychiatric/Behavioral: Positive for depression.  All other systems reviewed and are negative.   Past Medical History  Diagnosis Date  . Hyperlipidemia   . Thyroid disease     had thyroid removed  . Other chronic cystitis     botox injections  . History of lymphoma   . Self-catheterizes urinary bladder     twice daily  . Coronary artery disease   . Coarse tremors     head shakes if does not take propranolol  . Insomnia     takes remeron nightly  . Cancer     lymphomia  . Anemia     hx of blood transfusion   Past Surgical History  Procedure Date  . Cardiac catheterization 2005    2 cardiac stents  . Abdominal hysterectomy 1970s  . Eye surgery     bilateral cataracts  . Joint replacement 2012    right hip  . Stomach surgery     removal all but small portion of stomach out due to ulcers  . Appendectomy   . Back surgery     lower back - bulging disk  . Spine surgery 2008    [place]     Family History  Problem Relation Age of Onset  . Diabetes Mother   . Heart disease Father    Social History:  reports that she quit smoking about 40 years ago. She has never used smokeless tobacco. She reports that she does not drink alcohol or use illicit drugs. Allergies:  Allergies  Allergen Reactions  . Ciprofloxacin Swelling and Rash  . Penicillins Swelling and Rash  . Prednisone Rash  . Sulfa Antibiotics Swelling and Rash   Medications Prior to Admission  Medication Sig Dispense Refill  . levothyroxine (SYNTHROID, LEVOTHROID) 100 MCG tablet Take 100 mcg by mouth daily.      . mirtazapine (REMERON) 7.5 MG tablet Take 7.5 mg by mouth at bedtime.      . propranolol (INDERAL) 20 MG tablet Take 20 mg by mouth 2 (two) times daily.      . rosuvastatin (CRESTOR) 20 MG tablet Take 20 mg by mouth daily.      . temazepam (RESTORIL) 15 MG capsule Take 15 mg by mouth at bedtime.      Marland Kitchen aspirin EC 81 MG tablet Take 81 mg by mouth daily.        Home:    Functional History:   Functional Status:  Mobility:          ADL:    Cognition: Cognition Orientation Level: Oriented X4    Blood pressure 110/68, pulse 95, temperature 98.4 F (36.9 C), temperature source  Oral, resp. rate 18, height 5\' 6"  (1.676 m), weight 63 kg (138 lb 14.2 oz), SpO2 97.00%. Physical Exam  Constitutional: She is oriented to person, place, and time. She appears well-developed.  HENT:  Head: Normocephalic.  Neck: Neck supple. No thyromegaly present.  Cardiovascular: Regular rhythm.   Pulmonary/Chest: Breath sounds normal. No respiratory distress. She has no wheezes. She has no rales.  Abdominal: Bowel sounds are normal. She exhibits no distension. There is no tenderness.  Musculoskeletal: She exhibits tenderness. She exhibits no edema.  Neurological: She is alert and oriented to person, place, and time.       Patient named person place date of birth. She was able to name her primary care physician.  She follows three-step commands. UE grossly 4/5. LE is 3/5 prox to 4/5 distally. No gross sensory deficits. DTR's 1+.   Skin:       Back incision was clean and dry  Psychiatric: She has a normal mood and affect. Her behavior is normal. Judgment and thought content normal.    No results found for this or any previous visit (from the past 24 hour(s)). Dg Lumbar Spine 2-3 Views  01/24/2012  *RADIOLOGY REPORT*  Clinical Data: Back pain  LUMBAR SPINE - 2-3 VIEW  Comparison: CT myelogram 10/04/2011  Findings: Intraoperative cross-table lateral film at 1005 hours demonstrates a slightly angled probe directed most closely toward L5 pedicle, and a right angled probe directed most closely toward the dorsal S2 vertebral body.  Tissue spreaders are noted posteriorly.  IMPRESSION: As above.  Original Report Authenticated By: Evelyn [name], M.D.    Assessment/Plan: Diagnosis: lumbar stenosis with radiculopathy s/p decompression 1. Does the need for close, 24 hr/day medical supervision in concert with the patient's rehab needs make it unreasonable for this patient to be served in a less intensive setting? Potentially 2. Co-Morbidities requiring supervision/potential complications: CAD, UTI hx, chronic urinary retention 3. Due to bladder management, bowel management, safety, skin/wound care, disease management, medication administration, pain management and patient education, does the patient require 24 hr/day rehab nursing? No and Potentially 4. Does the patient require coordinated care of a physician, rehab nurse, PT (1-2 hrs/day, 5 days/week) and OT (1-2 hrs/day, 5 days/week) to address physical and functional deficits in the context of the above medical diagnosis(es)? No Addressing deficits in the following areas: balance, endurance, locomotion, strength, transferring, bowel/bladder control, bathing and dressing 5. Can the patient actively participate in an intensive therapy program of at least 3 hrs of  therapy per day at least 5 days per week? Potentially 6. The potential for patient to make measurable gains while on inpatient rehab is fair 7. Anticipated functional outcomes upon discharge from inpatient rehab are supervision with PT, supervision to min assist with OT, . 8. Estimated rehab length of stay to reach the above functional goals is: not app 9. Does the patient have adequate social supports to accommodate these discharge functional goals? No 10. Anticipated D/C setting: Home 11. Anticipated post D/C treatments: HH therapy 12. Overall Rehab/Functional Prognosis: excellent  RECOMMENDATIONS: This patient's condition is appropriate for continued rehabilitative care in the following setting: SNF Patient has agreed to participate in recommended program. Yes Note that insurance prior authorization may be required for reimbursement for recommended care.  Comment: Pt lacks social supports to return home after a CIR admission. She is familiar with [facility] from prior stays.   Ivory Broad, MD 01/25/2012

## 2012-01-25 NOTE — Clinical Social Work Placement (Addendum)
    Clinical Social Work Department CLINICAL SOCIAL WORK PLACEMENT NOTE 01/25/2012  Patient:  Evelyn [name], Evelyn [name]  Account Number:  [id number] Admit date:  01/24/2012  Clinical Social Worker:  Peggyann Shoals  Date/time:  01/25/2012 04:13 PM  Clinical Social Work is seeking post-discharge placement for this patient at the following level of care:   SKILLED NURSING   (*CSW will update this form in Epic as items are completed)   01/25/2012  Patient/family provided with [facility] Department of Clinical Social Work's list of facilities offering this level of care within the geographic area requested by the patient (or if unable, by the patient's family).  01/25/2012  Patient/family informed of their freedom to choose among providers that offer the needed level of care, that participate in Medicare, Medicaid or managed care program needed by the patient, have an available bed and are willing to accept the patient.  01/25/2012  Patient/family informed of [facility]' ownership interest in [facility], as well as of the fact that they are under no obligation to receive care at this facility.  PASARR submitted to EDS on 04/02/2007 PASARR number received from [employer] on 04/02/2007  FL2 transmitted to all facilities in geographic area requested by pt/family on  01/26/2012 FL2 transmitted to all facilities within larger geographic area on   Patient informed that his/her managed care company has contracts with or will negotiate with  certain facilities, including the following:     Patient/family informed of bed offers received:  01/26/2012 Patient chooses bed at [facility] Physician recommends and patient chooses bed at  SNF  Patient to be transferred to [facility] on 10/30/2011 Patient to be transferred to facility by [facility]  The following physician request were entered in Epic:   Additional Comments:  Dede Query, MSW, LCSWA [phone]

## 2012-01-26 ENCOUNTER — Encounter (HOSPITAL_COMMUNITY): Payer: Self-pay | Admitting: Neurosurgery

## 2012-01-26 MED ORDER — HYDROCODONE-ACETAMINOPHEN 5-325 MG PO TABS
1.0000 | ORAL_TABLET | Freq: Four times a day (QID) | ORAL | Status: DC | PRN
  Administered 2012-01-26 – 2012-01-27 (×3): 1 via ORAL
  Filled 2012-01-26 (×4): qty 1

## 2012-01-26 NOTE — Clinical Social Work Note (Signed)
CSW provided bed offers to pt. Pt shared that she would like [facility]	[place] for SNF. CSW will contact facility about bed choice. CSW will continue to follow to facilitate discharge to SNF.   Dede Query, MSW, Theresia Majors [phone]

## 2012-01-26 NOTE — Progress Notes (Signed)
Physical Therapy Treatment Patient Details Name: Evelyn [name] MRN: [id number] DOB: 1919/11/08 Today's Date: 01/26/2012 Time: 0720-0743 PT Time Calculation (min): 23 min  PT Assessment / Plan / Recommendation Comments on Treatment Session  Patient slowly progressing towards goal. Patient somewhat more confused this session. Patient stated that she thought it was lunchtime and that no one had helped her all night. Patient incontinent upon standing and required A for pericare. Noted that evaled stated that patient had brace however not noted in orders and no brace was in the room .RN made aware and mobilty limited. Spoke to evaluating therapist who stated that she made the entry in error.     Follow Up Recommendations  Skilled nursing facility    Barriers to Discharge        Equipment Recommendations  Defer to next venue    Recommendations for Other Services    Frequency Min 5X/week   Plan Discharge plan remains appropriate    Precautions / Restrictions Precautions Precautions: Back Precaution Comments: reviewed back precautions with patient but unsure her ability to comprehend at this time   Pertinent Vitals/Pain     Mobility  Bed Mobility Rolling Left: 4: Min assist;With rail Left Sidelying to Sit: 3: Mod assist;With rails Details for Bed Mobility Assistance: A with log roll technique and for trunk support into sitting. Cues for posture Transfers Sit to Stand: 4: Min assist;With upper extremity assist;From bed;From toilet Stand to Sit: 4: Min assist;With upper extremity assist;With armrests;To toilet;To chair/3-in-1 Details for Transfer Assistance: A to initiate stand and cues for safe hand placement. Cues and A to control descent into chair Ambulation/Gait Ambulation/Gait Assistance: 4: Min assist Ambulation Distance (Feet): 30 Feet Assistive device: Rolling walker Ambulation/Gait Assistance Details: A for balance and safe management of RW Gait Pattern: Decreased  stride length;Step-through pattern;Trunk flexed General Gait Details: Patient with decrease safety awareness with RW, especially in tight spaces. Cues for positioning of herself with RW    Exercises     PT Diagnosis:    PT Problem List:   PT Treatment Interventions:     PT Goals Acute Rehab PT Goals PT Goal: Rolling Supine to Left Side - Progress: Progressing toward goal PT Goal: Supine/Side to Sit - Progress: Progressing toward goal PT Goal: Sit to Stand - Progress: Progressing toward goal PT Goal: Stand to Sit - Progress: Progressing toward goal PT Goal: Ambulate - Progress: Progressing toward goal Additional Goals PT Goal: Additional Goal #1 - Progress: Progressing toward goal  Visit Information  Last PT Received On: 01/26/12 Assistance Needed: +1    Subjective Data  Subjective: I thought it was lunch time   Cognition  Overall Cognitive Status: Impaired Area of Impairment: Memory;Attention;Following commands;Safety/judgement Arousal/Alertness: Awake/alert Orientation Level: Appears intact for tasks assessed Behavior During Session: [facility] for tasks performed Current Attention Level: Focused Attention - Other Comments: Patient easily distracted today during activity. Patient preservating on being wet in the bed all night stating, "they didn't help me at all" Following Commands: Follows one step commands inconsistently Safety/Judgement: Decreased awareness of safety precautions;Decreased safety judgement for tasks assessed    Balance     End of Session PT - End of Session Equipment Utilized During Treatment: Gait belt Activity Tolerance: Patient tolerated treatment well Patient left: in chair Nurse Communication: Mobility status   GP     Fredrich Birks 01/26/2012, 8:03 AM 01/26/2012 Fredrich Birks PTA [phone] pager [phone] office

## 2012-01-26 NOTE — Progress Notes (Signed)
ANTIBIOTIC CONSULT NOTE - follow - up  Pharmacy Consult for vancomycin Indication: surgical prophylaxis  Allergies  Allergen Reactions  . Ciprofloxacin Swelling and Rash  . Penicillins Swelling and Rash  . Prednisone Rash  . Sulfa Antibiotics Swelling and Rash    Vital Signs: Temp: 98.3 F (36.8 C) (06/28 0948) Temp src: Oral (06/28 0948) BP: 102/42 mmHg (06/28 0948) Pulse Rate: 83  (06/28 0948)  Labs: No results found for this basename: WBC:3,HGB:3,PLT:3,LABCREA:3,CREATININE:3 in the last 72 hours Estimated Creatinine Clearance: 44.6 ml/min (by C-G formula based on Cr of 0.7). No results found for this basename: VANCOTROUGH:2,VANCOPEAK:2,VANCORANDOM:2,GENTTROUGH:2,GENTPEAK:2,GENTRANDOM:2,TOBRATROUGH:2,TOBRAPEAK:2,TOBRARND:2,AMIKACINPEAK:2,AMIKACINTROU:2,AMIKACIN:2, in the last 72 hours   Microbiology: Recent Results (from the past 720 hour(s))  SURGICAL PCR SCREEN     Status: Abnormal   Collection Time   01/18/12  2:01 PM      Component Value Range Status Comment   MRSA, PCR POSITIVE (*) NEGATIVE Final    Staphylococcus aureus POSITIVE (*) NEGATIVE Final     Medical History: Past Medical History  Diagnosis Date  . Hyperlipidemia   . Thyroid disease     had thyroid removed  . Other chronic cystitis     botox injections  . History of lymphoma   . Self-catheterizes urinary bladder     twice daily  . Coronary artery disease   . Coarse tremors     head shakes if does not take propranolol  . Insomnia     takes remeron nightly  . Cancer     lymphomia  . Anemia     hx of blood transfusion  Assessment: 78 year old female s/p lumbar laminectomy/decompression, received orders to start IV vancomycin for post-op surgical prophylaxis given pcn allergy. Renal function was normal on preadmit labs. No further labs have been drawn. No fevers have been noted  Goal of Therapy:  Vancomycin trough 10-15  Plan:  Vancomycin 750mg  IV q 24 hours  Will need to check scr +/-  vancomycin trough over weekend if to continue  Evelyn [name] 01/26/2012,11:51 AM

## 2012-01-26 NOTE — Progress Notes (Signed)
Patient ID: Evelyn [name], female   DOB: 1920-03-03, 78 y.o.   MRN: [id number] Afebril. C/o incisional pain and leg pain. No weakness. Mild confusion. As per rehabi;litation medicine she is not a candidate to transfer. Will get         social worker to help with going to snf

## 2012-01-26 NOTE — Progress Notes (Signed)
Occupational Therapy Evaluation Patient Details Name: Evelyn [name] MRN: [id number] DOB: 08-Jan-1920 Today's Date: 01/26/2012 Time: 1327-1400 OT Time Calculation (min): 33 min  OT Assessment / Plan / Recommendation Clinical Impression  Pt admitted for s/p Right L4-5 foraminotomy, lysis of adhesion, decompression of the L5 and S1 nerve root.  Will benefit from acute OT to address below problem list in prep for d/c to SNF.    OT Assessment  Patient needs continued OT Services    Follow Up Recommendations  Skilled nursing facility    Barriers to Discharge      Equipment Recommendations  Defer to next venue    Recommendations for Other Services    Frequency  Min 2X/week    Precautions / Restrictions Precautions Precautions: Back Required Braces or Orthoses: Spinal Brace Spinal Brace: Lumbar corset;Applied in sitting position   Pertinent Vitals/Pain See vitals    ADL  Eating/Feeding: Performed;Modified independent Where Assessed - Eating/Feeding: Chair Grooming: Performed;Applying makeup;Supervision/safety Where Assessed - Grooming: Supported sitting Upper Body Dressing: Performed;Set up Where Assessed - Upper Body Dressing: Supported sitting Lower Body Dressing: Performed;Moderate assistance Where Assessed - Lower Body Dressing: Supported sit to Pharmacist, hospital: Performed;Moderate assistance Toilet Transfer Method: Sit to stand Toilet Transfer Equipment: Bedside commode Toileting - Clothing Manipulation and Hygiene: Performed;Moderate assistance Where Assessed - Toileting Clothing Manipulation and Hygiene: Standing Equipment Used: Back brace;Gait belt;Rolling walker Transfers/Ambulation Related to ADLs: mod assist with sit<>stand from chair ADL Comments: Pt with 2 episodes of urinary incontinence during session (RN reports pt self caths?).    While demonstrating significant cognitive deficits, pt able to independently recall 3/3 back precautions.   Pt attempting  multiple times to stand from chair without assist.  Contacted RN and nurse tech for chair alarm to be placed in chair.    OT Diagnosis: Generalized weakness;Cognitive deficits;Acute pain  OT Problem List: Decreased strength;Decreased activity tolerance;Decreased safety awareness;Decreased knowledge of use of DME or AE;Decreased knowledge of precautions;Decreased cognition;Pain OT Treatment Interventions: Self-care/ADL training;DME and/or AE instruction;Therapeutic activities;Cognitive remediation/compensation;Patient/family education   OT Goals Acute Rehab OT Goals OT Goal Formulation: With patient Time For Goal Achievement: 02/09/12 Potential to Achieve Goals: Good ADL Goals Pt Will Perform Upper Body Dressing: with modified independence;Sitting, chair;Sitting, bed ADL Goal: Upper Body Dressing - Progress: Goal set today Pt Will Perform Lower Body Dressing: Sit to stand from chair;Sit to stand from bed;with supervision ADL Goal: Lower Body Dressing - Progress: Goal set today Pt Will Transfer to Toilet: with DME;3-in-1;Maintaining back safety precautions;Ambulation;with supervision ADL Goal: Toilet Transfer - Progress: Goal set today Pt Will Perform Toileting - Clothing Manipulation: with supervision;Standing ADL Goal: Toileting - Clothing Manipulation - Progress: Goal set today Pt Will Perform Toileting - Hygiene: with supervision;Standing at 3-in-1/toilet ADL Goal: Toileting - Hygiene - Progress: Goal set today Miscellaneous OT Goals Miscellaneous OT Goal #1: Pt will perform bed mobility with supervision in prep for EOB ADLs. OT Goal: Miscellaneous Goal #1 - Progress: Goal set today  Visit Information  Last OT Received On: 01/26/12 Assistance Needed: +1    Subjective Data      Prior Functioning  Home Living Lives With: Alone Type of Home: House Home Access: Stairs to enter [employer] of Steps: 2 Home Layout: One level Bathroom Shower/Tub: Teacher, music: Standard Home Adaptive Equipment: Walker - rolling;Tub transfer bench Prior Function Level of Independence: Needs assistance Needs Assistance: Light Housekeeping Light Housekeeping: Total Driving: Yes Vocation: Retired Musician: No difficulties    Cognition  Overall Cognitive Status: Impaired Area of Impairment: Memory;Attention;Following commands;Safety/judgement Arousal/Alertness: Awake/alert Orientation Level: Appears intact for tasks assessed Behavior During Session: [facility] for tasks performed Current Attention Level: Focused Attention - Other Comments: Patient easily distracted today during activity. Patient preservating on being wet in the bed all night stating, "they didn't help me at all" Following Commands: Follows one step commands inconsistently Safety/Judgement: Decreased awareness of safety precautions;Decreased safety judgement for tasks assessed Cognition - Other Comments: Pt repeatedly reported she needs to leave today to go to a funeral, but is unsure whose funeral she is going to.  Pt also perseverating on tearing apart her dinner roll to feed to 6 blackbirds in her room.    Extremity/Trunk Assessment Right Upper Extremity Assessment RUE ROM/Strength/Tone: Within functional levels Left Upper Extremity Assessment LUE ROM/Strength/Tone: Within functional levels   Mobility Transfers Sit to Stand: 3: Mod assist;From chair/3-in-1;With upper extremity assist Stand to Sit: 4: Min assist;To chair/3-in-1;With upper extremity assist Details for Transfer Assistance: manual cues to reach back for arm rests.   Exercise    Balance    End of Session OT - End of Session Equipment Utilized During Treatment: Gait belt;Back brace Activity Tolerance: Patient tolerated treatment well Patient left: in chair;with call bell/phone within reach;with nursing in room Nurse Communication: Mobility status;Other (comment) (pt repeatedly attempting to get  out of chair)  GO   01/26/2012 Cipriano Mile OTR/L Pager [phone] Office [phone]   Cipriano Mile 01/26/2012, 4:39 PM

## 2012-01-26 NOTE — Progress Notes (Addendum)
In and out cath performed; 400 cc, clear yellow urine. Pt has been incontinent of large amount of urine prior to cath.

## 2012-01-26 NOTE — Progress Notes (Signed)
Per Rehab consult on 01/25/12, patient does not meet criteria for CIR due to lack of CG support. CIR signing off at this time. Toni Amend adm coordinator [phone].

## 2012-01-27 NOTE — Progress Notes (Signed)
Physical Therapy Treatment Patient Details Name: Evelyn [name] MRN: [id number] DOB: Jul 23, 1920 Today's Date: 01/27/2012 Time: 5784-6962 PT Time Calculation (min): 23 min  PT Assessment / Plan / Recommendation Comments on Treatment Session  Pt continues with confusion limiting mobility as well as carryover of education.    Follow Up Recommendations  Skilled nursing facility    Barriers to Discharge        Equipment Recommendations  Defer to next venue    Recommendations for Other Services    Frequency Min 5X/week   Plan Discharge plan remains appropriate;Frequency remains appropriate    Precautions / Restrictions Precautions Precautions: Back Precaution Comments: reviewed back precautions with patient but unsure her ability to comprehend at this time Required Braces or Orthoses: Spinal Brace Spinal Brace: Lumbar corset;Applied in sitting position   Pertinent Vitals/Pain No specific c/o pain other than RLE spasms.    Mobility  Bed Mobility Bed Mobility: Rolling Left;Left Sidelying to Sit;Sit to Sidelying Left Rolling Left: 5: Set up;With rail Left Sidelying to Sit: 4: Min assist;With rails;HOB flat Sit to Sidelying Left: 4: Min assist;With rail;HOB flat Details for Bed Mobility Assistance: Cues for technique.  Assist at trunk to sitting and for LE's to supine. Transfers Transfers: Sit to Stand;Stand to Sit Sit to Stand: 4: Min assist;From bed;With upper extremity assist Stand to Sit: 4: Min assist;With upper extremity assist;To bed Details for Transfer Assistance: cues for hand placement Ambulation/Gait Ambulation/Gait Assistance: 4: Min assist Ambulation Distance (Feet): 75 Feet Assistive device: Rolling walker Ambulation/Gait Assistance Details: assist to steer RW at times, cues for position in RW and posture. Gait Pattern: Step-through pattern;Decreased stride length;Trunk flexed       PT Goals Acute Rehab PT Goals Time For Goal Achievement:  02/01/12 Potential to Achieve Goals: Good PT Goal: Rolling Supine to Left Side - Progress: Progressing toward goal PT Goal: Supine/Side to Sit - Progress: Progressing toward goal PT Goal: Sit to Supine/Side - Progress: Progressing toward goal PT Goal: Sit to Stand - Progress: Progressing toward goal PT Goal: Stand to Sit - Progress: Progressing toward goal PT Goal: Ambulate - Progress: Progressing toward goal Additional Goals PT Goal: Additional Goal #1 - Progress: Progressing toward goal  Visit Information  Last PT Received On: 01/27/12 Assistance Needed: +1    Subjective Data  Subjective: "I just came in last night and they decided to keep me."   Cognition  Overall Cognitive Status: Impaired Area of Impairment: Memory;Attention;Following commands;Safety/judgement Arousal/Alertness: Awake/alert Orientation Level: Disoriented to;Situation Behavior During Session: [facility] for tasks performed Current Attention Level: Focused Following Commands: Follows one step commands inconsistently Safety/Judgement: Decreased awareness of safety precautions;Decreased safety judgement for tasks assessed    Balance  Static Sitting Balance Static Sitting - Balance Support: Feet supported Static Sitting - Level of Assistance: 5: Stand by assistance Static Sitting - Comment/# of Minutes: 5  End of Session PT - End of Session Equipment Utilized During Treatment: Gait belt Activity Tolerance: Patient tolerated treatment well Patient left: in bed;with call bell/phone within reach;with bed alarm set Nurse Communication: Mobility status       Newell Coral 01/27/2012, 12:59 PM  Newell Coral, PTA Acute Rehab [phone] (office)

## 2012-01-27 NOTE — Progress Notes (Signed)
Doing well. Sl. confused No Nausea /vomiting   Temp:  [97.9 F (36.6 C)-100.5 F (38.1 C)] 98 F (36.7 C) (06/29 0454) Pulse Rate:  [74-96] 96  (06/29 0613) Resp:  [18-20] 20  (06/29 0613) BP: (102-169)/(42-86) 150/69 mmHg (06/29 0613) SpO2:  [92 %-100 %] 92 % (06/29 0981) Moves all 4 Incision CDI  Plan: Await placement

## 2012-01-28 MED ORDER — METHOCARBAMOL 500 MG PO TABS: 500.0000 mg | ORAL_TABLET | Freq: Four times a day (QID) | ORAL | Status: DC | PRN

## 2012-01-28 NOTE — Progress Notes (Signed)
Was asked to Take over care for the patient in room 3035 from that patient's current RN due to personality conflict.  I gave that RN report on this patient and I am transferring her care to the new RN.

## 2012-01-28 NOTE — Progress Notes (Signed)
Bladder scan at 0500 resulted with PVR 675 cc. In and out cath performed.

## 2012-01-28 NOTE — Progress Notes (Signed)
Physical Therapy Treatment Patient Details Name: Evelyn [name] MRN: [id number] DOB: [birth year] Today's Date: 01/28/2012 Time: 1140-1207 PT Time Calculation (min): 27 min  PT Assessment / Plan / Recommendation Comments on Treatment Session  Pt able to increase ambulation distance as well as trial stair training today even though pt going to [facility] prior to d/c home.      Follow Up Recommendations  Skilled nursing facility    Barriers to Discharge        Equipment Recommendations  Defer to next venue    Recommendations for Other Services    Frequency Min 5X/week   Plan Discharge plan remains appropriate;Frequency remains appropriate    Precautions / Restrictions Precautions Precautions: Back Precaution Comments: Pt able to recall 2/3 back precautions.  cueing for "no arching".   Required Braces or Orthoses: Spinal Brace Restrictions Weight Bearing Restrictions: No       Mobility  Bed Mobility Bed Mobility: Rolling Left;Left Sidelying to Sit Rolling Left: 4: Min guard Left Sidelying to Sit: 4: Min assist;HOB flat Details for Bed Mobility Assistance: Cues to reinforce back precautions & technique.  (A) to lift shoulders/trunk to sitting upright.   Transfers Transfers: Sit to Stand;Stand to Sit Sit to Stand: 4: Min assist;With upper extremity assist;With armrests;From chair/3-in-1;From bed;From toilet Stand to Sit: 4: Min guard;With upper extremity assist;With armrests;To chair/3-in-1;To toilet Details for Transfer Assistance: Cues for safe hand placement & body positioning before sitting Ambulation/Gait Ambulation/Gait Assistance: 4: Min assist Ambulation Distance (Feet): 150 Feet Assistive device: Rolling walker Ambulation/Gait Assistance Details: (A) for balance, ocassional RW management, & safety.  Cues for tall posture, positioning inside RW.   Gait Pattern: Step-through pattern;Right flexed knee in stance;Left flexed knee in stance;Decreased stride length General  Gait Details: Pt ambulates with crouched-like posture Stairs: Yes Stairs Assistance: 4: Min assist Stairs Assistance Details (indicate cue type and reason): cues for technique Stair Management Technique: Two rails;Step to pattern;Forwards Number of Stairs: 2  Wheelchair Mobility Wheelchair Mobility: No    Exercises        PT Goals Acute Rehab PT Goals Time For Goal Achievement: 02/01/12 PT Goal: Rolling Supine to Left Side - Progress: Progressing toward goal PT Goal: Supine/Side to Sit - Progress: Progressing toward goal PT Goal: Sit to Stand - Progress: Not met PT Goal: Stand to Sit - Progress: Not met PT Goal: Ambulate - Progress: Progressing toward goal Additional Goals PT Goal: Additional Goal #1 - Progress: Progressing toward goal  Visit Information  Last PT Received On: 01/28/12 Assistance Needed: +1    Subjective Data      Cognition  Overall Cognitive Status: Impaired Area of Impairment: Memory;Following commands;Safety/judgement Arousal/Alertness: Awake/alert Orientation Level: Disoriented to;Situation Behavior During Session: [facility] for tasks performed Memory: Decreased recall of precautions Following Commands: Follows multi-step commands inconsistently Safety/Judgement: Decreased awareness of safety precautions;Decreased safety judgement for tasks assessed    Balance     End of Session PT - End of Session Equipment Utilized During Treatment: Gait belt Activity Tolerance: Patient tolerated treatment well Patient left: in chair;with call bell/phone within reach   GP     Lara Mulch 01/28/2012, 12:38 PM  Verdell Face, PTA [phone] 01/28/2012

## 2012-01-28 NOTE — Progress Notes (Signed)
Patient ID: Evelyn [name], female   DOB: [birth year], 78 y.o.   MRN: [id number] Doing fairly well. Complains of leg spasms, but nursing says it does fine with a vicodin. She might need some sort of placement, and will defer that to Dr Jeral Fruit tomorrow.

## 2012-01-29 ENCOUNTER — Encounter: Payer: Self-pay | Admitting: Internal Medicine

## 2012-01-29 LAB — BASIC METABOLIC PANEL
CO2: 30 mEq/L (ref 19–32)
Chloride: 96 mEq/L (ref 96–112)
Potassium: 3 mEq/L — ABNORMAL LOW (ref 3.5–5.1)
Sodium: 136 mEq/L (ref 135–145)

## 2012-01-29 MED ORDER — LOPERAMIDE HCL 2 MG PO CAPS
2.0000 mg | ORAL_CAPSULE | Freq: Four times a day (QID) | ORAL | Status: DC | PRN
  Administered 2012-01-29: 2 mg via ORAL
  Filled 2012-01-29 (×3): qty 1

## 2012-01-29 MED ORDER — TAMSULOSIN HCL 0.4 MG PO CAPS
0.4000 mg | ORAL_CAPSULE | Freq: Every day | ORAL | Status: DC
  Filled 2012-01-29: qty 1

## 2012-01-29 NOTE — Progress Notes (Signed)
Occupational Therapy Treatment Patient Details Name: Evelyn [name] MRN: [id number] DOB: November 22, 1919 Today's Date: 01/29/2012 Time: 1478-2956 OT Time Calculation (min): 19 min  OT Assessment / Plan / Recommendation Comments on Treatment Session Pt progressing well- although, fatigued today    Follow Up Recommendations  Skilled nursing facility    Barriers to Discharge       Equipment Recommendations  Defer to next venue    Recommendations for Other Services    Frequency     Plan Discharge plan remains appropriate    Precautions / Restrictions Precautions Precautions: Back   Pertinent Vitals/Pain Pt with no c/o pain during session     ADL  Toilet Transfer: Simulated;Min guard Toilet Transfer Method: Sit to stand Toilet Transfer Equipment: Bedside commode Toileting - Clothing Manipulation and Hygiene: Minimal assistance;Simulated Where Assessed - Glass blower/designer Manipulation and Hygiene: Standing Transfers/Ambulation Related to ADLs: Pt Min guard A with RW ambulation; pt reports fatigue quickly ADL Comments: Pt progressing well- fatigued today    OT Diagnosis:    OT Problem List:   OT Treatment Interventions:     OT Goals ADL Goals ADL Goal: Toilet Transfer - Progress: Progressing toward goals ADL Goal: Toileting - Clothing Manipulation - Progress: Progressing toward goals ADL Goal: Toileting - Hygiene - Progress: Progressing toward goals Miscellaneous OT Goals OT Goal: Miscellaneous Goal #1 - Progress: Progressing toward goals  Visit Information  Last OT Received On: 01/29/12 Assistance Needed: +1    Subjective Data      Prior Functioning       Cognition  Area of Impairment: Memory;Following commands;Safety/judgement Arousal/Alertness: Awake/alert Orientation Level: Appears intact for tasks assessed Behavior During Session: [facility] for tasks performed    Mobility Bed Mobility Rolling Left: 4: Min guard Left Sidelying to Sit: 4: Min assist Sit to  Sidelying Left: 4: Min assist Details for Bed Mobility Assistance: A for positioning and to acheive full upright posture. Cues for safe technique Transfers Sit to Stand: 4: Min assist;From bed;With upper extremity assist Stand to Sit: 4: Min guard;With upper extremity assist;To bed Details for Transfer Assistance: A for safety. Cues for safe technique and use of RW   Exercises    Balance    End of Session OT - End of Session Equipment Utilized During Treatment: Gait belt Activity Tolerance: Patient tolerated treatment well Patient left: in bed;with call bell/phone within reach  GO     Evelyn [name] 01/29/2012, 2:52 PM

## 2012-01-29 NOTE — Progress Notes (Signed)
Pt had a K level of 3.0, called MD Dutch Quint on call, he said still okay to d/c to SNF, will call to give report to SNF

## 2012-01-29 NOTE — Progress Notes (Signed)
Patient ID: Evelyn [name], female   DOB: Jan 18, 1920, 78 y.o.   MRN: [id number] more awake. Decrease of lbp, no weakness. Unable to void. waioing for nursing home. Wound dry

## 2012-01-29 NOTE — Clinical Social Work Note (Signed)
Pt is ready for discharge today to [facility]. Facility had received discharge summary and is ready to accept pt. RN will call report. Pt and son are agreeable to discharge plan. [facility] will provide transportation to facility. CSW is signing off as no further needs identified.   Dede Query, MSW, Theresia Majors [phone]

## 2012-01-29 NOTE — Progress Notes (Signed)
Bladder scan revealed 500 cc of urine.  Performed I&O catheterization with yielded 400 cc of clear, yellow urine.  Patient tolerated procedure well and I will continue to monitor.

## 2012-01-29 NOTE — Discharge Summary (Signed)
Physician Discharge Summary  Patient ID: NIYANNA ASCH MRN: [id number] DOB/AGE: 1919-08-31 78 y.o.  Admit date: 01/24/2012 Discharge date: 01/29/2012  Admission Diagnoses:right l45 and l5s5 foraminal stenosis. Urinary retention  Discharge Diagnoses: same   Discharged Condition numbness or right foot, no weakness. diarrhea  Hospital Course:surgery  Consults:none  Significant Diagnostic Studies: lumbar myelogram  Treatments: decompression of right l45 and l5s1 foramen  Discharge Exam:numbness of right l5 dermatome.some off and on diarrhea with some urinary retention Blood pressure 120/68, pulse 83, temperature 98.2 F (36.8 C), temperature source Oral, resp. rate 18, height 5\' 6"  (1.676 m), weight 63 kg (138 lb 14.2 oz), SpO2 98.00%. Disposition: SNF PATIENT TO HAVE THE LUMBAR STAPLES REMOVED IN 1 WEEK. ALSO SHE CAN USE FLOMAX FOR HER URINARY RETENTION  IF OK WITH THE SNF MD.   Medication List  As of 01/29/2012  3:24 PM   STOP taking these medications         rosuvastatin 20 MG tablet      temazepam 15 MG capsule         TAKE these medications         aspirin EC 81 MG tablet   Take 81 mg by mouth daily.      levothyroxine 100 MCG tablet   Commonly known as: SYNTHROID, LEVOTHROID   Take 100 mcg by mouth daily.      mirtazapine 7.5 MG tablet   Commonly known as: REMERON   Take 7.5 mg by mouth at bedtime.      propranolol 20 MG tablet   Commonly known as: INDERAL   Take 20 mg by mouth 2 (two) times daily.          TO SEE ME IN 6 TO 8 WEEKS OR BEFORE PRN.   Signed: Karn Cassis 01/29/2012, 3:24 PM

## 2012-01-29 NOTE — Progress Notes (Signed)
Physical Therapy Treatment Patient Details Name: Evelyn [name] MRN: [id number] DOB: 10/20/1919 Today's Date: 01/29/2012 Time: 4098-1191 PT Time Calculation (min): 19 min  PT Assessment / Plan / Recommendation Comments on Treatment Session  Patient progressing well today with ambulation. Required some encouragement to participate as she stated that she wanted to take a nap.     Follow Up Recommendations  Skilled nursing facility    Barriers to Discharge        Equipment Recommendations  Defer to next venue    Recommendations for Other Services    Frequency Min 5X/week   Plan Discharge plan remains appropriate;Frequency remains appropriate    Precautions / Restrictions Precautions Precautions: Back Required Braces or Orthoses: Spinal Brace   Pertinent Vitals/Pain     Mobility  Bed Mobility Rolling Left: 4: Min guard Left Sidelying to Sit: 4: Min assist Sit to Sidelying Left: 4: Min assist Details for Bed Mobility Assistance: A for positioning and to acheive full upright posture. Cues for safe technique Transfers Sit to Stand: 4: Min assist;From bed;With upper extremity assist Stand to Sit: 4: Min guard;With upper extremity assist;To bed Details for Transfer Assistance: A for safety. Cues for safe technique and use of RW Ambulation/Gait Ambulation/Gait Assistance: 4: Min guard Ambulation Distance (Feet): 200 Feet Assistive device: Rolling walker Ambulation/Gait Assistance Details: Cues for posture. Patient needing cues for rolling walker in tight spaces Gait Pattern: Step-through pattern;Decreased stride length;Trunk flexed General Gait Details: Patient with slight kyphotic posture with gait.     Exercises     PT Diagnosis:    PT Problem List:   PT Treatment Interventions:     PT Goals Acute Rehab PT Goals PT Goal: Rolling Supine to Left Side - Progress: Progressing toward goal PT Goal: Supine/Side to Sit - Progress: Progressing toward goal PT Goal: Sit to  Supine/Side - Progress: Progressing toward goal PT Goal: Sit to Stand - Progress: Progressing toward goal PT Goal: Stand to Sit - Progress: Progressing toward goal PT Goal: Ambulate - Progress: Progressing toward goal Additional Goals PT Goal: Additional Goal #1 - Progress: Progressing toward goal  Visit Information  Last PT Received On: 01/29/12 Assistance Needed: +1    Subjective Data      Cognition  Area of Impairment: Memory;Following commands;Safety/judgement Arousal/Alertness: Awake/alert Orientation Level: Appears intact for tasks assessed Behavior During Session: [facility] for tasks performed    Balance     End of Session PT - End of Session Equipment Utilized During Treatment: Gait belt Activity Tolerance: Patient tolerated treatment well Patient left: in bed;with call bell/phone within reach Nurse Communication: Mobility status   GP     Fredrich Birks 01/29/2012, 11:53 AM 01/29/2012 Fredrich Birks PTA [phone] pager [phone] office

## 2012-01-30 LAB — URINALYSIS, COMPLETE
Bacteria: NONE SEEN
Bilirubin,UR: NEGATIVE
Glucose,UR: NEGATIVE mg/dL (ref 0–75)
Ketone: NEGATIVE
Leukocyte Esterase: NEGATIVE
Nitrite: NEGATIVE
Ph: 6 (ref 4.5–8.0)
Specific Gravity: 1.003 (ref 1.003–1.030)
Squamous Epithelial: <1
WBC UR: <1 /HPF (ref 0–5)

## 2012-01-30 NOTE — Care Management Note (Signed)
    Page 1 of 1   01/30/2012     9:57:34 AM   CARE MANAGEMENT NOTE 01/30/2012  Patient:  Evelyn [name], Evelyn [name]   Account Number:  [id number]  Date Initiated:  01/29/2012  Documentation initiated by:  Onnie Boer  Subjective/Objective Assessment:   PT WAS ADMITTED FOR FUSION     Action/Plan:   PROGRESSION OF CARE AND DISCHARGE PLANNING   Anticipated DC Date:  01/29/2012   Anticipated DC Plan:  SKILLED NURSING FACILITY  In-house referral  Clinical Social Worker      DC Planning Services  CM consult      Choice offered to / List presented to:             Status of service:  Completed, signed off Medicare Important Message given?   (If response is "NO", the following Medicare IM given date fields will be blank) Date Medicare IM given:   Date Additional Medicare IM given:    Discharge Disposition:  SKILLED NURSING FACILITY  Per UR Regulation:  Reviewed for med. necessity/level of care/duration of stay  If discussed at Long Length of Stay Meetings, dates discussed:    Comments:  01/29/12 Onnie Boer, RN, BSN PT [facility]'D [facility]

## 2012-01-31 LAB — URINE CULTURE

## 2012-02-06 ENCOUNTER — Ambulatory Visit: Payer: Self-pay | Admitting: Oncology

## 2012-02-20 LAB — TSH: Thyroid Stimulating Horm: 6.99 u[IU]/mL — ABNORMAL HIGH

## 2012-02-29 ENCOUNTER — Encounter: Payer: Self-pay | Admitting: Internal Medicine

## 2012-03-29 ENCOUNTER — Ambulatory Visit (INDEPENDENT_AMBULATORY_CARE_PROVIDER_SITE_OTHER): Payer: Medicare Other | Admitting: Internal Medicine

## 2012-03-29 ENCOUNTER — Encounter: Payer: Self-pay | Admitting: Internal Medicine

## 2012-03-29 VITALS — BP 128/66 | HR 84 | Temp 98.2°F | Resp 16 | Wt 140.2 lb

## 2012-03-29 DIAGNOSIS — N3020 Other chronic cystitis without hematuria: Secondary | ICD-10-CM

## 2012-03-29 DIAGNOSIS — R197 Diarrhea, unspecified: Secondary | ICD-10-CM

## 2012-03-29 DIAGNOSIS — E538 Deficiency of other specified B group vitamins: Secondary | ICD-10-CM

## 2012-03-29 DIAGNOSIS — D510 Vitamin B12 deficiency anemia due to intrinsic factor deficiency: Secondary | ICD-10-CM

## 2012-03-29 DIAGNOSIS — M48061 Spinal stenosis, lumbar region without neurogenic claudication: Secondary | ICD-10-CM

## 2012-03-29 LAB — COMPREHENSIVE METABOLIC PANEL
Albumin: 3.8 g/dL (ref 3.5–5.2)
Alkaline Phosphatase: 72 U/L (ref 39–117)
CO2: 29 mEq/L (ref 19–32)
GFR: 78.41 mL/min (ref >60.00)
Glucose, Bld: 91 mg/dL (ref 70–99)
Potassium: 4.1 mEq/L (ref 3.5–5.1)
Sodium: 138 mEq/L (ref 135–145)
Total Protein: 7.3 g/dL (ref 6.0–8.3)

## 2012-03-29 LAB — MAGNESIUM: Magnesium: 2.1 mg/dL (ref 1.5–2.5)

## 2012-03-29 NOTE — Progress Notes (Signed)
Patient ID: Evelyn [name], female   DOB: 1920/01/25, 78 y.o.   MRN: [id number]  Patient Active Problem List  Diagnosis  . Urinary tract infection  . Hypertension  . Hyperlipidemia  . Thyroid disease  . Other chronic cystitis  . History of lymphoma  . Anemia, pernicious  . Spinal stenosis of lumbar region  . Diarrhea    Subjective:  CC:   Chief Complaint  Patient presents with  . Follow-up    HPI:   Evelyn [name] a 78 y.o. female who presents with multiple subacute complaints 1) Diarrhea for the past week,  3 or 4 episodes reported today, without abdominal cramping.  She has been treating with immodium 4 so far.  Has been on antibiotics (doxycycline) by Dr Achilles Dunk since yesterday .  Last time before that was June, when seh received vancomycin prophylaxis during  lumbar spine surgery done June 26 by Jeral Fruit.   She has a history of chronic recurrent diarrhea.   Eats oatmeal /banana for breakfast with orange juice,  Lunch is usually a sandwhich .  Uses Lactaid for milk.  Lives alone so eats TV dinners and prepared foods most of the time, or cooks vegetables /chicken. 2) follow up on recent lumbar spine surgery .  Her right leg has started hurting this week posterior thigh aches.  She was released by Dr. Jeral Fruit post operatively 3) She has been having urinary and fecal incontinence due to urgency.  Has history of cystoscopy  and Botox injection in Apri 2013 by Dr. Achilles Dunk and her last colonoscopy was normal in 2007 .    Past Medical History  Diagnosis Date  . Hyperlipidemia   . Thyroid disease     had thyroid removed  . Other chronic cystitis     Radiation , botox injections  . History of lymphoma   . Self-catheterizes urinary bladder     twice daily  . Coronary artery disease   . Coarse tremors     head shakes if does not take propranolol  . Insomnia     takes remeron nightly  . Cancer     lymphomia  . Anemia     hx of blood transfusion    Past Surgical History    Procedure Date  . Cardiac catheterization 2005    2 cardiac stents  . Abdominal hysterectomy 1970s  . Eye surgery     bilateral cataracts  . Joint replacement 2012    right hip  . Stomach surgery     removal all but small portion of stomach out due to ulcers  . Appendectomy   . Back surgery     lower back - bulging disk  . Spine surgery 2008    [facility]  . Lumbar laminectomy/decompression microdiscectomy 01/24/2012    Procedure: LUMBAR LAMINECTOMY/DECOMPRESSION MICRODISCECTOMY 2 LEVELS;  Surgeon: Karn Cassis, MD;  Location: [facility];  Service: Neurosurgery;  Laterality: Right;  Right Lumbar four five, lumbar five sacral one foraminotomy   . Cystoscopy April 2013         The following portions of the patient's history were reviewed and updated as appropriate: Allergies, current medications, and problem list.    Review of Systems:    A comprehensive ROS was done and positive for right leg pain and urinary incontinence.   The rest was negative.   History   Social History  . Marital Status: Widowed    Spouse Name: N/A    Number of Children: N/A  .  Years of Education: N/A   Occupational History  . Not on file.   Social History Main Topics  . Smoking status: Former Smoker    Quit date: 04/04/1971  . Smokeless tobacco: Never Used  . Alcohol Use: No  . Drug Use: No  . Sexually Active: Not on file   Other Topics Concern  . Not on file   Social History Narrative  . No narrative on file    Objective:  BP 128/66  Pulse 84  Temp 98.2 F (36.8 C) (Oral)  Resp 16  Wt 140 lb 4 oz (63.617 kg)  SpO2 97%  General appearance: alert, cooperative and appears stated age Ears: normal TM's and external ear canals both ears Throat: lips, mucosa, and tongue normal; teeth and gums normal Neck: no adenopathy, no carotid bruit, supple, symmetrical, trachea midline and thyroid not enlarged, symmetric, no tenderness/mass/nodules Back: symmetric, no curvature. ROM  normal. No CVA tenderness. Lungs: clear to auscultation bilaterally Heart: regular rate and rhythm, S1, S2 normal, no murmur, click, rub or gallop Abdomen: soft, non-tender; bowel sounds normal; no masses,  no organomegaly Pulses: 2+ and symmetric Skin: Skin color, texture, turgor normal. No rashes or lesions Lymph nodes: Cervical, supraclavicular, and axillary nodes normal.  Assessment and Plan:  Spinal stenosis of lumbar region S/p lumbar disk surgery late June by Hilda Lias for disk herniation resulting in spinal stenosis.  She is walking well with no foot drop but has had recurrence of pain on the right without numbness.  Advised to use vicodin prn .  Anemia, pernicious B12 level was normal.  Continue monthly injections.   Diarrhea She has no signs of infection today and her stools are not liquid as evidenced by collection today.   Other chronic cystitis She has a history of radiation cystits with chronic pain and recurrent UTI , with recent normal cystoscopy and botox injection by Dr. Achilles Dunk.    Updated Medication List Outpatient Encounter Prescriptions as of 03/29/2012  Medication Sig Dispense Refill  . aspirin EC 81 MG tablet Take 81 mg by mouth daily.      Marland Kitchen levothyroxine (SYNTHROID, LEVOTHROID) 100 MCG tablet Take 100 mcg by mouth daily.      . mirtazapine (REMERON) 7.5 MG tablet Take 7.5 mg by mouth at bedtime.      . propranolol (INDERAL) 20 MG tablet Take 20 mg by mouth 2 (two) times daily.         Orders Placed This Encounter  Procedures  . Comprehensive metabolic panel  . Magnesium  . Vitamin B12    Return in about 3 months (around 06/29/2012).

## 2012-03-29 NOTE — Patient Instructions (Signed)
Please take a pain pill with your sleeping pill so you will rest better.  Make sure you take the antibiotic with food so it doesn't upset your stomach

## 2012-03-31 ENCOUNTER — Encounter: Payer: Self-pay | Admitting: Internal Medicine

## 2012-04-01 ENCOUNTER — Encounter: Payer: Self-pay | Admitting: Internal Medicine

## 2012-04-01 DIAGNOSIS — R197 Diarrhea, unspecified: Secondary | ICD-10-CM | POA: Insufficient documentation

## 2012-04-01 NOTE — Assessment & Plan Note (Signed)
She has a history of radiation cystits with chronic pain and recurrent UTI , with recent normal cystoscopy and botox injection by Dr. Achilles Dunk.

## 2012-04-01 NOTE — Assessment & Plan Note (Signed)
B12 level was normal.  Continue monthly injections.

## 2012-04-01 NOTE — Assessment & Plan Note (Signed)
S/p lumbar disk surgery late June by Hilda Lias for disk herniation resulting in spinal stenosis.  She is walking well with no foot drop but has had recurrence of pain on the right without numbness.  Advised to use vicodin prn .

## 2012-04-01 NOTE — Assessment & Plan Note (Signed)
She has no signs of infection today and her stools are not liquid as evidenced by collection today.

## 2012-04-25 ENCOUNTER — Ambulatory Visit: Payer: Self-pay | Admitting: Oncology

## 2012-04-25 LAB — CBC CANCER CENTER
Basophil #: 0.1 x10 3/mm (ref 0.0–0.1)
Eosinophil #: 0.3 x10 3/mm (ref 0.0–0.7)
Eosinophil %: 3.6 %
HGB: 10.8 g/dL — ABNORMAL LOW (ref 12.0–16.0)
Lymphocyte #: 2.6 x10 3/mm (ref 1.0–3.6)
MCH: 27.3 pg (ref 26.0–34.0)
MCHC: 31.6 g/dL — ABNORMAL LOW (ref 32.0–36.0)
Monocyte #: 1 x10 3/mm — ABNORMAL HIGH (ref 0.2–0.9)
Monocyte %: 10.4 %
Neutrophil #: 5.6 x10 3/mm (ref 1.4–6.5)
Platelet: 354 x10 3/mm (ref 150–440)
RDW: 16.9 % — ABNORMAL HIGH (ref 11.5–14.5)

## 2012-04-25 LAB — COMPREHENSIVE METABOLIC PANEL
Albumin: 3.2 g/dL — ABNORMAL LOW (ref 3.4–5.0)
Alkaline Phosphatase: 72 U/L (ref 50–136)
Anion Gap: 8 (ref 7–16)
BUN: 13 mg/dL (ref 7–18)
Calcium, Total: 8.5 mg/dL (ref 8.5–10.1)
Co2: 28 mmol/L (ref 21–32)
Glucose: 122 mg/dL — ABNORMAL HIGH (ref 65–99)
Potassium: 4.1 mmol/L (ref 3.5–5.1)
SGOT(AST): 17 U/L (ref 15–37)
SGPT (ALT): 17 U/L (ref 12–78)

## 2012-04-30 ENCOUNTER — Ambulatory Visit: Payer: Self-pay | Admitting: Oncology

## 2012-05-08 ENCOUNTER — Other Ambulatory Visit: Payer: Self-pay | Admitting: Internal Medicine

## 2012-05-09 ENCOUNTER — Other Ambulatory Visit: Payer: Self-pay

## 2012-05-09 NOTE — Telephone Encounter (Signed)
Refill request for Hydrocodone-acetaminophen 5-500 mg. Ok to refill?

## 2012-05-10 ENCOUNTER — Other Ambulatory Visit: Payer: Self-pay

## 2012-05-10 MED ORDER — HYDROCODONE-ACETAMINOPHEN 5-500 MG PO TABS: 1.0000 | ORAL_TABLET | Freq: Three times a day (TID) | ORAL | Status: DC | PRN

## 2012-05-10 NOTE — Telephone Encounter (Signed)
Refill authorized. Prescription should be on printer.

## 2012-07-02 ENCOUNTER — Other Ambulatory Visit: Payer: Self-pay | Admitting: Internal Medicine

## 2012-07-03 ENCOUNTER — Encounter: Payer: Self-pay | Admitting: Internal Medicine

## 2012-07-03 ENCOUNTER — Ambulatory Visit: Payer: Medicare Other | Admitting: Internal Medicine

## 2012-07-03 ENCOUNTER — Ambulatory Visit (INDEPENDENT_AMBULATORY_CARE_PROVIDER_SITE_OTHER): Payer: Medicare Other | Admitting: Internal Medicine

## 2012-07-03 VITALS — BP 138/70 | HR 86 | Temp 99.0°F | Resp 12 | Ht 65.0 in | Wt 140.5 lb

## 2012-07-03 DIAGNOSIS — E039 Hypothyroidism, unspecified: Secondary | ICD-10-CM

## 2012-07-03 DIAGNOSIS — R1310 Dysphagia, unspecified: Secondary | ICD-10-CM

## 2012-07-03 DIAGNOSIS — R1314 Dysphagia, pharyngoesophageal phase: Secondary | ICD-10-CM

## 2012-07-03 DIAGNOSIS — D510 Vitamin B12 deficiency anemia due to intrinsic factor deficiency: Secondary | ICD-10-CM

## 2012-07-03 DIAGNOSIS — Z1331 Encounter for screening for depression: Secondary | ICD-10-CM

## 2012-07-03 DIAGNOSIS — M48061 Spinal stenosis, lumbar region without neurogenic claudication: Secondary | ICD-10-CM

## 2012-07-03 MED ORDER — HYDROCODONE-ACETAMINOPHEN 5-325 MG PO TABS: 1.0000 | ORAL_TABLET | Freq: Four times a day (QID) | ORAL | Status: DC | PRN

## 2012-07-03 NOTE — Progress Notes (Addendum)
Patient ID: Evelyn [name], female   DOB: 1920-07-17, 78 y.o.   MRN: [id number]   Patient Active Problem List  Diagnosis  . Urinary tract infection  . Hypertension  . Hyperlipidemia  . Unspecified hypothyroidism  . Other chronic cystitis  . History of lymphoma  . Anemia, pernicious  . Spinal stenosis of lumbar region  . Diarrhea  . Dysphagia, pharyngoesophageal phase    Subjective:  CC:   Chief Complaint  Patient presents with  . Follow-up    HPI:   Evelyn [name] a 78 y.o. female who presents for follow up on chronic conditions.  She celebrated her 89th birthday with family last week.  They threw her a surprise birthday party 100 attendees at her church (she was told they were going to the [facility] and she was truly surprised) .  1) Back pain.  She has a history of lumbar spine surgery in June by Hilda Lias.  Per his notes she did well postoperatively and denied having any pain.  She states that her back pain is worse and radiates from right buttock all the way to her foot.  She is manging her pain with one evicodin daily.  She has had no falls in over a year.  She lives alone and wears her Life Alert 24/7. 3) Dysphagia..  She has been having episodes of regurgitation with every meal.  She reports that she brings up a mouthful of food every time she eats.  The episodes are prreceded by burping.  This has been occurring for 2 months.  She has history of irradiation of the neck remotely during treatment for lymphoma and wonders if she has a stricture .     Past Medical History  Diagnosis Date  . Hyperlipidemia   . Thyroid disease     had thyroid removed  . Other chronic cystitis     Radiation , botox injections  . History of lymphoma   . Self-catheterizes urinary bladder     twice daily  . Coronary artery disease   . Coarse tremors     head shakes if does not take propranolol  . Insomnia     takes remeron nightly  . Cancer     lymphomia  . Anemia    hx of blood transfusion    Past Surgical History  Procedure Date  . Cardiac catheterization 2005    2 cardiac stents  . Abdominal hysterectomy 1970s  . Eye surgery     bilateral cataracts  . Joint replacement 2012    right hip  . Stomach surgery     removal all but small portion of stomach out due to ulcers  . Appendectomy   . Back surgery     lower back - bulging disk  . Spine surgery 2008    [facility]  . Lumbar laminectomy/decompression microdiscectomy 01/24/2012    Procedure: LUMBAR LAMINECTOMY/DECOMPRESSION MICRODISCECTOMY 2 LEVELS;  Surgeon: Karn Cassis, MD;  Location: [facility];  Service: Neurosurgery;  Laterality: Right;  Right Lumbar four five, lumbar five sacral one foraminotomy   . Cystoscopy April 2013    The following portions of the patient's history were reviewed and updated as appropriate: Allergies, current medications, and problem list.    Review of Systems: Patient denies headache, fevers, malaise, unintentional weight loss, skin rash, eye pain, sinus congestion and sinus pain, sore throat,  hemoptysis , cough, dyspnea, wheezing, chest pain, palpitations, orthopnea, edema, abdominal pain, nausea, melena, constipation, flank pain, dysuria, hematuria,  urinary  Frequency, nocturia, numbness, tingling, seizures,  Focal weakness, Loss of consciousness,  Tremor, insomnia, depression, anxiety, and suicidal ideation.        History   Social History  . Marital Status: Widowed    Spouse Name: N/A    Number of Children: N/A  . Years of Education: N/A   Occupational History  . Not on file.   Social History Main Topics  . Smoking status: Former Smoker    Quit date: 04/04/1971  . Smokeless tobacco: Never Used  . Alcohol Use: No  . Drug Use: No  . Sexually Active: Not on file   Other Topics Concern  . Not on file   Social History Narrative  . No narrative on file    Objective:  BP 138/70  Pulse 86  Temp 99 F (37.2 C) (Oral)  Resp 12  Ht 5'  5" (1.651 m)  Wt 140 lb 8 oz (63.73 kg)  BMI 23.38 kg/m2  SpO2 98%  General appearance: alert, cooperative and appears stated age Ears: normal TM's and external ear canals both ears Throat: lips, mucosa, and tongue normal; teeth and gums normal Neck: no adenopathy, no carotid bruit, supple, symmetrical, trachea midline and thyroid not enlarged, symmetric, no tenderness/mass/nodules Back: symmetric, no curvature. ROM normal. No CVA tenderness. Lungs: clear to auscultation bilaterally Heart: regular rate and rhythm, S1, S2 normal, no murmur, click, rub or gallop Abdomen: soft, non-tender; bowel sounds normal; no masses,  no organomegaly Pulses: 2+ and symmetric Skin: Skin color, texture, turgor normal. No rashes or lesions Lymph nodes: Cervical, supraclavicular, and axillary nodes normal.  Assessment and Plan:  Dysphagia, pharyngoesophageal phase Suggested by history of regurgitation occurring with every meal.  Barium swalloworedered to rule out stricture . Risk factors include age and prior XRT of neck for lymphoma.   Continue daily omeprazole .she could not afford Nexium.   Anemia, pernicious Managed with monthly injections.   Spinal stenosis of lumbar region S/p lumbar decompression June 2013 by Hilda Lias.  Patient continues to report daily pain.  She does not want   Increase vicodin to twice daily.   Unspecified hypothyroidism Continue thyroid supplementation.  TSH was normal last check.    Updated Medication List Outpatient Encounter Prescriptions as of 07/03/2012  Medication Sig Dispense Refill  . aspirin EC 81 MG tablet Take 81 mg by mouth daily.      Marland Kitchen HYDROcodone-acetaminophen (VICODIN) 5-500 MG per tablet       . levothyroxine (SYNTHROID, LEVOTHROID) 100 MCG tablet Take 100 mcg by mouth daily.      . mirtazapine (REMERON) 7.5 MG tablet Take 7.5 mg by mouth at bedtime.      . propranolol (INDERAL) 20 MG tablet Take 20 mg by mouth 2 (two) times daily.      .  [DISCONTINUED] HYDROcodone-acetaminophen (VICODIN) 5-500 MG per tablet TAKE ONE TABLET BY MOUTH EVERY 8 HOURS AS NEEDED FOR SEVERE PAIN  60 tablet  0  . [DISCONTINUED] HYDROcodone-acetaminophen (VICODIN) 5-500 MG per tablet Take 1 tablet by mouth every 8 (eight) hours as needed for pain.  60 tablet  1  . HYDROcodone-acetaminophen (NORCO/VICODIN) 5-325 MG per tablet Take 1 tablet by mouth every 6 (six) hours as needed for pain.  60 tablet  3     Orders Placed This Encounter  Procedures  . DG Esophagus    No Follow-up on file.

## 2012-07-03 NOTE — Patient Instructions (Addendum)
We will order an x ray of your esophagus to see why you are choking on your food.  We will call you with the appointment   We have sent a new prescription to [facility] for your pain medicaiton

## 2012-07-04 ENCOUNTER — Other Ambulatory Visit: Payer: Self-pay

## 2012-07-04 ENCOUNTER — Other Ambulatory Visit: Payer: Self-pay | Admitting: Internal Medicine

## 2012-07-05 NOTE — Telephone Encounter (Signed)
Dr. [name],  The Remeron. 7.5 has been discontinued.  Is this accurate or should I refill. Please advise.

## 2012-07-06 ENCOUNTER — Encounter: Payer: Self-pay | Admitting: Internal Medicine

## 2012-07-06 DIAGNOSIS — R1314 Dysphagia, pharyngoesophageal phase: Secondary | ICD-10-CM | POA: Insufficient documentation

## 2012-07-06 NOTE — Assessment & Plan Note (Signed)
Continue thyroid supplementation.  TSH was normal last check.

## 2012-07-06 NOTE — Assessment & Plan Note (Addendum)
Suggested by history of regurgitation occurring with every meal.  Barium swalloworedered to rule out stricture . Risk factors include age and prior XRT of neck for lymphoma.   Continue daily omeprazole .she could not afford Nexium.

## 2012-07-06 NOTE — Assessment & Plan Note (Signed)
S/p lumbar decompression June 2013 by Hilda Lias.  Patient continues to report daily pain.  She does not want   Increase vicodin to twice daily.

## 2012-07-06 NOTE — Assessment & Plan Note (Signed)
Managed with monthly injections.  

## 2012-07-08 ENCOUNTER — Other Ambulatory Visit: Payer: Self-pay | Admitting: Internal Medicine

## 2012-07-10 ENCOUNTER — Other Ambulatory Visit: Payer: Self-pay

## 2012-07-10 NOTE — Telephone Encounter (Signed)
Refill request for propranolo 20 mg. Ok to refill?

## 2012-07-12 ENCOUNTER — Other Ambulatory Visit: Payer: Self-pay

## 2012-07-12 MED ORDER — PROPRANOLOL HCL 20 MG PO TABS: 20.0000 mg | ORAL_TABLET | Freq: Two times a day (BID) | ORAL | Status: DC

## 2012-08-22 ENCOUNTER — Telehealth: Payer: Self-pay | Admitting: Internal Medicine

## 2012-08-22 NOTE — Telephone Encounter (Signed)
Temazepam 15 mg cap  Take on capsule by mouth at bedtime at 9 pm for sleep   # 30

## 2012-08-23 ENCOUNTER — Other Ambulatory Visit: Payer: Self-pay | Admitting: General Practice

## 2012-08-23 MED ORDER — MIRTAZAPINE 15 MG PO TABS: 15.0000 mg | ORAL_TABLET | Freq: Every day | ORAL | Status: DC

## 2012-08-23 MED ORDER — TEMAZEPAM 15 MG PO CAPS: 15.0000 mg | ORAL_CAPSULE | Freq: Every evening | ORAL | Status: DC | PRN

## 2012-08-23 NOTE — Telephone Encounter (Signed)
This med was last given on 01/29/12 with a note to stop taking at discharge. Ok to fill?

## 2012-08-23 NOTE — Telephone Encounter (Signed)
Ok to refill,  Authorized in epic 

## 2012-08-23 NOTE — Telephone Encounter (Signed)
Med filled.  

## 2012-08-27 ENCOUNTER — Other Ambulatory Visit: Payer: Self-pay | Admitting: Internal Medicine

## 2012-08-28 NOTE — Telephone Encounter (Signed)
Med filled.  

## 2012-09-04 ENCOUNTER — Ambulatory Visit: Payer: Self-pay | Admitting: Oncology

## 2012-09-05 LAB — CBC CANCER CENTER
Basophil #: 0.1 x10 3/mm (ref 0.0–0.1)
Basophil %: 1 %
Eosinophil %: 6 %
HGB: 11 g/dL — ABNORMAL LOW (ref 12.0–16.0)
MCH: 26.9 pg (ref 26.0–34.0)
MCHC: 32.6 g/dL (ref 32.0–36.0)
MCV: 83 fL (ref 80–100)
Monocyte %: 10.4 %
Neutrophil %: 55.3 %
Platelet: 315 x10 3/mm (ref 150–440)
RBC: 4.1 10*6/uL (ref 3.80–5.20)
RDW: 18 % — ABNORMAL HIGH (ref 11.5–14.5)
WBC: 10.1 x10 3/mm (ref 3.6–11.0)

## 2012-09-05 LAB — COMPREHENSIVE METABOLIC PANEL
Albumin: 3.5 g/dL (ref 3.4–5.0)
Alkaline Phosphatase: 83 U/L (ref 50–136)
Anion Gap: 10 (ref 7–16)
Calcium, Total: 8.3 mg/dL — ABNORMAL LOW (ref 8.5–10.1)
Chloride: 104 mmol/L (ref 98–107)
Co2: 27 mmol/L (ref 21–32)
Creatinine: 1.37 mg/dL — ABNORMAL HIGH (ref 0.60–1.30)
EGFR (Non-African Amer.): 34 — ABNORMAL LOW
Glucose: 104 mg/dL — ABNORMAL HIGH (ref 65–99)
SGOT(AST): 14 U/L — ABNORMAL LOW (ref 15–37)
SGPT (ALT): 20 U/L (ref 12–78)
Sodium: 141 mmol/L (ref 136–145)
Total Protein: 7 g/dL (ref 6.4–8.2)

## 2012-09-16 ENCOUNTER — Telehealth: Payer: Self-pay | Admitting: Internal Medicine

## 2012-09-16 NOTE — Telephone Encounter (Signed)
Patient Information:  Caller Name: Caylea  Phone: [phone]  Patient: Evelyn [name], Evelyn [name]  Gender: Female  DOB: [birth year]  Age: 79 Years  PCP: Duncan Dull (Adults only)  Office Follow Up:  Does the office need to follow up with this patient?: Yes  Instructions For The Office: OFFICE PLEASE FOLLOW UP WITH PT AND HER GOUT PAIN TO SEE IF APPT CAN BE SCHEDULED LATER FOR HER OR IF MEDICATION CAN BE ORDERED   Symptoms  Reason For Call & Symptoms: caller reports she is having Gout pain.  Caller reports pain in her right toe.  Caller reports pain began 2-3 weeks  Reviewed Health History In EMR: Yes  Reviewed Medications In EMR: Yes  Reviewed Allergies In EMR: Yes  Reviewed Surgeries / Procedures: Yes  Date of Onset of Symptoms: Unknown  Guideline(s) Used:  Foot Pain  Disposition Per Guideline:   See Within 3 Days in Office  Reason For Disposition Reached:   Pain in the big toe joint  Advice Given:  Call Back If:  You become worse.  Patient Refused Recommendation:  Patient Requests Prescription  Pt was requesting medication for GOUT pain.  RN viewed in EPIC, that GOUT was not on her problem list, nor has Dr [name] evaluated pt for this.  RN advised pt that she would need to be seen for this and offered appt for tomorrow (09/17/12), pt declined appt  and said she did not want to come in for an appt for this.

## 2012-09-16 NOTE — Telephone Encounter (Signed)
i will not prescribe any meds without seeing patient since i have not treated her before for gout.  She can take 2 advil and 1 tylenol every 6 hours for inflammation and pain and make appt if no better with that regimen

## 2012-09-16 NOTE — Telephone Encounter (Signed)
Called pt no answer °

## 2012-09-16 NOTE — Telephone Encounter (Signed)
Pt.notified

## 2012-09-28 ENCOUNTER — Ambulatory Visit: Payer: Self-pay | Admitting: Oncology

## 2012-11-22 ENCOUNTER — Telehealth: Payer: Self-pay | Admitting: Internal Medicine

## 2012-11-22 ENCOUNTER — Other Ambulatory Visit: Payer: Self-pay | Admitting: Internal Medicine

## 2012-11-22 NOTE — Telephone Encounter (Signed)
Caller: Evelyn [name]/Patient; Phone: [phone]; Reason for Call: Patient calling for renewal of Rx restoril.  States due for refill, and [facility] has sent request, but they have not heard back from the office, and now the patient is completely out of medication.  Per Epic, last Rx written 08/23/12 for restoril 15mg , 1 po q hs, #30, RF x 2 and remeron 15mg  1 po q hs, #30, RF x 4.  Info to office for provider review/Rx/callback.  Uses [facility]  May reach patient at [phone].  Krs/can

## 2012-11-22 NOTE — Telephone Encounter (Signed)
The patient is completely out of her temazepam (RESTORIL) 15 MG capsule . She is wanting this medication called into the pharmacy today.

## 2012-11-22 NOTE — Telephone Encounter (Signed)
Rx phoned to pharmacy. Pt notified.

## 2012-12-03 ENCOUNTER — Other Ambulatory Visit: Payer: Self-pay | Admitting: *Deleted

## 2012-12-03 DIAGNOSIS — E039 Hypothyroidism, unspecified: Secondary | ICD-10-CM

## 2012-12-03 DIAGNOSIS — Z79899 Other long term (current) drug therapy: Secondary | ICD-10-CM

## 2012-12-03 NOTE — Telephone Encounter (Signed)
Refill? Last TSH 12/12. Thanks.

## 2012-12-05 MED ORDER — LEVOTHYROXINE SODIUM 100 MCG PO TABS: 100.0000 ug | ORAL_TABLET | Freq: Every day | ORAL | Status: DC

## 2012-12-05 NOTE — Telephone Encounter (Signed)
Refilled   the Synthroid for 30 days only ,  ask patient to have TSH drawn ASAP .  I will place order

## 2012-12-05 NOTE — Telephone Encounter (Signed)
Left message for pt to return my call.

## 2012-12-06 NOTE — Telephone Encounter (Signed)
Pt made lab appt for 12/09/12.

## 2012-12-09 ENCOUNTER — Other Ambulatory Visit (INDEPENDENT_AMBULATORY_CARE_PROVIDER_SITE_OTHER): Payer: Medicare Other

## 2012-12-09 DIAGNOSIS — D649 Anemia, unspecified: Secondary | ICD-10-CM

## 2012-12-09 DIAGNOSIS — Z79899 Other long term (current) drug therapy: Secondary | ICD-10-CM

## 2012-12-09 DIAGNOSIS — E039 Hypothyroidism, unspecified: Secondary | ICD-10-CM

## 2012-12-09 LAB — CBC WITH DIFFERENTIAL/PLATELET
Basophils Absolute: 0.1 10*3/uL (ref 0.0–0.1)
Eosinophils Relative: 3.7 % (ref 0.0–5.0)
Lymphs Abs: 2.3 10*3/uL (ref 0.7–4.0)
MCV: 82.3 fl (ref 78.0–100.0)
Monocytes Absolute: 1 10*3/uL (ref 0.1–1.0)
Neutrophils Relative %: 66 % (ref 43.0–77.0)
Platelets: 404 10*3/uL — ABNORMAL HIGH (ref 150.0–400.0)
RDW: 16.2 % — ABNORMAL HIGH (ref 11.5–14.6)
WBC: 11.3 10*3/uL — ABNORMAL HIGH (ref 4.5–10.5)

## 2012-12-09 LAB — COMPREHENSIVE METABOLIC PANEL
ALT: 11 U/L (ref 0–35)
Albumin: 3.2 g/dL — ABNORMAL LOW (ref 3.5–5.2)
Alkaline Phosphatase: 59 U/L (ref 39–117)
BUN: 24 mg/dL — ABNORMAL HIGH (ref 6–23)
Chloride: 106 mEq/L (ref 96–112)
Creatinine, Ser: 1.1 mg/dL (ref 0.4–1.2)
GFR: 51.71 mL/min — ABNORMAL LOW (ref >60.00)
Total Protein: 6.7 g/dL (ref 6.0–8.3)

## 2012-12-09 LAB — TSH: TSH: 1.44 u[IU]/mL (ref 0.35–5.50)

## 2012-12-10 NOTE — Addendum Note (Signed)
Addended by: Sherlene Shams on: 12/10/2012 07:42 AM   Modules accepted: Orders

## 2012-12-25 ENCOUNTER — Other Ambulatory Visit (INDEPENDENT_AMBULATORY_CARE_PROVIDER_SITE_OTHER): Payer: Medicare Other

## 2012-12-25 DIAGNOSIS — D509 Iron deficiency anemia, unspecified: Secondary | ICD-10-CM

## 2012-12-25 DIAGNOSIS — D649 Anemia, unspecified: Secondary | ICD-10-CM

## 2012-12-25 DIAGNOSIS — D510 Vitamin B12 deficiency anemia due to intrinsic factor deficiency: Secondary | ICD-10-CM

## 2012-12-25 LAB — IRON AND TIBC
Iron: 32 ug/dL — ABNORMAL LOW (ref 42–145)
UIBC: 335 ug/dL (ref 125–400)

## 2012-12-27 DIAGNOSIS — D509 Iron deficiency anemia, unspecified: Secondary | ICD-10-CM | POA: Insufficient documentation

## 2012-12-27 MED ORDER — FERROUS FUM-IRON POLYSACCH-FA 162-115.2-1 MG PO CAPS: 1.0000 | ORAL_CAPSULE | Freq: Every day | ORAL | Status: DC

## 2012-12-27 NOTE — Assessment & Plan Note (Signed)
And iron deficiency noted as well. Asked to resume 12 injections

## 2012-12-27 NOTE — Addendum Note (Signed)
Addended by: Sherlene Shams on: 12/27/2012 07:07 AM   Modules accepted: Orders

## 2012-12-31 ENCOUNTER — Ambulatory Visit (INDEPENDENT_AMBULATORY_CARE_PROVIDER_SITE_OTHER): Payer: Medicare Other | Admitting: Internal Medicine

## 2012-12-31 ENCOUNTER — Encounter: Payer: Self-pay | Admitting: Internal Medicine

## 2012-12-31 VITALS — BP 128/64 | HR 78 | Temp 98.6°F | Resp 16 | Wt 132.0 lb

## 2012-12-31 DIAGNOSIS — E039 Hypothyroidism, unspecified: Secondary | ICD-10-CM

## 2012-12-31 DIAGNOSIS — N390 Urinary tract infection, site not specified: Secondary | ICD-10-CM

## 2012-12-31 DIAGNOSIS — N3020 Other chronic cystitis without hematuria: Secondary | ICD-10-CM

## 2012-12-31 DIAGNOSIS — I10 Essential (primary) hypertension: Secondary | ICD-10-CM

## 2012-12-31 DIAGNOSIS — D510 Vitamin B12 deficiency anemia due to intrinsic factor deficiency: Secondary | ICD-10-CM

## 2012-12-31 DIAGNOSIS — D509 Iron deficiency anemia, unspecified: Secondary | ICD-10-CM

## 2012-12-31 DIAGNOSIS — M48061 Spinal stenosis, lumbar region without neurogenic claudication: Secondary | ICD-10-CM

## 2012-12-31 DIAGNOSIS — D649 Anemia, unspecified: Secondary | ICD-10-CM

## 2012-12-31 DIAGNOSIS — M549 Dorsalgia, unspecified: Secondary | ICD-10-CM

## 2012-12-31 LAB — POCT URINALYSIS DIPSTICK
Bilirubin, UA: NEGATIVE
Glucose, UA: NEGATIVE
Nitrite, UA: POSITIVE
Spec Grav, UA: 1.01

## 2012-12-31 MED ORDER — CYANOCOBALAMIN 1000 MCG/ML IJ SOLN
1000.0000 ug | Freq: Once | INTRAMUSCULAR | Status: AC
  Administered 2012-12-31: 1000 ug via INTRAMUSCULAR

## 2012-12-31 MED ORDER — HYDROCODONE-ACETAMINOPHEN 5-325 MG PO TABS: 1.0000 | ORAL_TABLET | Freq: Four times a day (QID) | ORAL | Status: DC | PRN

## 2012-12-31 NOTE — Patient Instructions (Addendum)
We need to have you return next Tuesday for a b2 injection ,  And the following week,  For your third weekly injection .    Then we will resume monthly b12 injections  You need to take iron also becuaes you are anemic  We will recheck in one month and see if it has improved.   I want to check your stools for blood at home and bring back to [place]  i will send a prescription for an antibiotic to [facility] once your urine culture is resulted  Use the vicodin every 6 hours as needed for back pain

## 2012-12-31 NOTE — Progress Notes (Signed)
Patient ID: Evelyn [name], female   DOB: 1920-04-19, 79 y.o.   MRN: [id number]   Patient Active Problem List   Diagnosis Date Noted  . Anemia, iron deficiency 12/27/2012  . Dysphagia, pharyngoesophageal phase 07/06/2012  . Anemia, pernicious 10/15/2011  . Spinal stenosis of lumbar region 10/15/2011  . Hypertension   . Hyperlipidemia   . Unspecified hypothyroidism   . Other chronic cystitis   . History of lymphoma   . Urinary tract infection 07/14/2011    Subjective:  CC:   Chief Complaint  Patient presents with  . Follow-up    patient reports she was suppose to have B12 once per month, but has not been receiving.    HPI:   Evelyn [name] a 79 y.o. female who presents for 6 month follow up on multiple chronic conditions including Iron and b12 deficiency anemias, and chronic recurrent cystitis. She has not had a b12 injection in months and B12 is now low. She reports excessive fatigue and recurrent low back pain and has provided a urine sample.  She was treated for gout in her big toe several weeks ago by Dr. Doylene Canning during her checkup for history of lymphoma and is requesting refill on the  medication he gave her but doesn't remember what it was. She cannot recall how many episode per year she has but it is under 3 .  Last episode was a month ago  Back pain and Saddle parasthesias.  Since her lumbar spine surgery she has persistent numbness and states that she cannot tell when she is defecating,  However ,  She has not had any fecal incontinence.    Past Medical History  Diagnosis Date  . Hyperlipidemia   . Thyroid disease     had thyroid removed  . Other chronic cystitis     Radiation , botox injections  . History of lymphoma   . Self-catheterizes urinary bladder     twice daily  . Coronary artery disease   . Coarse tremors     head shakes if does not take propranolol  . Insomnia     takes remeron nightly  . Cancer     lymphomia  . Anemia     hx of blood  transfusion    Past Surgical History  Procedure Laterality Date  . Cardiac catheterization  2005    2 cardiac stents  . Abdominal hysterectomy  1970s  . Eye surgery      bilateral cataracts  . Joint replacement  2012    right hip  . Stomach surgery      removal all but small portion of stomach out due to ulcers  . Appendectomy    . Back surgery      lower back - bulging disk  . Spine surgery  2008    [place]  . Lumbar laminectomy/decompression microdiscectomy  01/24/2012    Procedure: LUMBAR LAMINECTOMY/DECOMPRESSION MICRODISCECTOMY 2 LEVELS;  Surgeon: Karn Cassis, MD;  Location: [facility];  Service: Neurosurgery;  Laterality: Right;  Right Lumbar four five, lumbar five sacral one foraminotomy   . Cystoscopy  April 2013    The following portions of the patient's history were reviewed and updated as appropriate: Allergies, current medications, and problem list.    Review of Systems:   Patient denies headache, fevers,unintentional weight loss, skin rash, eye pain, sinus congestion and sinus pain, sore throat, dysphagia,  hemoptysis , cough, dyspnea, wheezing, chest pain, palpitations, orthopnea, edema, abdominal pain, nausea, melena, ,  flank pain, dysuria, hematuria, nocturia,, tingling, seizures,  Focal weakness, Loss of consciousness,  Tremor, insomnia, depression, anxiety, and suicidal ideation.     History   Social History  . Marital Status: Widowed    Spouse Name: N/A    Number of Children: N/A  . Years of Education: N/A   Occupational History  . Not on file.   Social History Main Topics  . Smoking status: Former Smoker    Quit date: 04/04/1971  . Smokeless tobacco: Never Used  . Alcohol Use: No  . Drug Use: No  . Sexually Active: Not on file   Other Topics Concern  . Not on file   Social History Narrative  . No narrative on file    Objective:  BP 128/64  Pulse 78  Temp(Src) 98.6 F (37 C) (Oral)  Resp 16  Wt 132 lb (59.875 kg)  BMI 21.97  kg/m2  SpO2 98%  General appearance: alert, cooperative and appears stated age Ears: normal TM's and external ear canals both ears Throat: lips, mucosa, and tongue normal; teeth and gums normal Neck: no adenopathy, no carotid bruit, supple, symmetrical, trachea midline and thyroid not enlarged, symmetric, no tenderness/mass/nodules Back: symmetric, no curvature. ROM normal. No CVA tenderness. Lungs: clear to auscultation bilaterally Heart: regular rate and rhythm, S1, S2 normal, no murmur, click, rub or gallop Abdomen: soft, non-tender; bowel sounds normal; no masses,  no organomegaly Pulses: 2+ and symmetric Skin: Skin color, texture, turgor normal. No rashes or lesions Lymph nodes: Cervical, supraclavicular, and axillary nodes normal.  Assessment and Plan:  Spinal stenosis of lumbar region S/p lumbar decompression June 2013 by Hilda Lias.  Patient continues to report daily pain and now endorses saddle parasthesias without fecal incontinence. Vicodin refilled .  Anemia, pernicious B12 is low secondary to lapse in supplementation for the past 6 months. Resuming b12 injections with 3 weekly starting today.   Other chronic cystitis UA is abnormal but she is not endorsing dysuria.  Awaiting urine culture prior to treatment given her multiple drug allergies.   Hypertension Well controlled on current regimen. Renal function stable, no changes today.  Anemia, iron deficiency hgb has dropped to 9.9 ,  Last hgb was 12.2 one year ago.  B12 and iron deficienies noted.  Home FOBTS and iron supplement prescribed. Repeat cbc in one montj.   Unspecified hypothyroidism TSH is at gaol at 1.44  No changes to levothyroxine dose today    Updated Medication List Outpatient Encounter Prescriptions as of 12/31/2012  Medication Sig Dispense Refill  . aspirin EC 81 MG tablet Take 81 mg by mouth daily.      Marland Kitchen HYDROcodone-acetaminophen (NORCO/VICODIN) 5-325 MG per tablet Take 1 tablet by mouth every  6 (six) hours as needed for pain.  60 tablet  3  . levothyroxine (SYNTHROID, LEVOTHROID) 100 MCG tablet TAKE ONE TABLET BY MOUTH EVERY DAY  90 tablet  1  . levothyroxine (SYNTHROID, LEVOTHROID) 100 MCG tablet Take 1 tablet (100 mcg total) by mouth daily.  30 tablet  0  . mirtazapine (REMERON) 15 MG tablet Take 1 tablet (15 mg total) by mouth at bedtime.  30 tablet  4  . propranolol (INDERAL) 20 MG tablet Take 1 tablet (20 mg total) by mouth 2 (two) times daily.  180 tablet  3  . temazepam (RESTORIL) 15 MG capsule TAKE ONE CAPSULE BY MOUTH AT BEDTIME AS NEEDED FOR SLEEP  30 capsule  5  . [DISCONTINUED] HYDROcodone-acetaminophen (NORCO/VICODIN) 5-325 MG per tablet Take  1 tablet by mouth every 6 (six) hours as needed for pain.  60 tablet  3  . HYDROcodone-acetaminophen (VICODIN) 5-500 MG per tablet TAKE ONE TABLET BY MOUTH EVERY 8 HOURS AS NEEDED FOR SEVERE PAIN  60 tablet  0  . [DISCONTINUED] Ferrous Fum-Iron Polysacch-FA (TANDEM F) 162-115.2-1 MG CAPS Take 1 capsule by mouth daily after lunch.  90 each  3  . [DISCONTINUED] HYDROcodone-acetaminophen (VICODIN) 5-500 MG per tablet       . [EXPIRED] cyanocobalamin ((VITAMIN B-12)) injection 1,000 mcg        No facility-administered encounter medications on file as of 12/31/2012.     Orders Placed This Encounter  Procedures  . CULTURE, URINE COMPREHENSIVE  . Urine Microscopic Only  . POCT Urinalysis Dipstick    No Follow-up on file.

## 2013-01-01 LAB — URINALYSIS, MICROSCOPIC ONLY
Casts: NONE SEEN
Crystals: NONE SEEN

## 2013-01-02 ENCOUNTER — Encounter: Payer: Self-pay | Admitting: Internal Medicine

## 2013-01-02 NOTE — Assessment & Plan Note (Signed)
TSH is at gaol at 1.44  No changes to levothyroxine dose today

## 2013-01-02 NOTE — Assessment & Plan Note (Signed)
hgb has dropped to 9.9 ,  Last hgb was 12.2 one year ago.  B12 and iron deficienies noted.  Home FOBTS and iron supplement prescribed. Repeat cbc in one montj.

## 2013-01-02 NOTE — Assessment & Plan Note (Signed)
S/p lumbar decompression June 2013 by Hilda Lias.  Patient continues to report daily pain and now endorses saddle parasthesias without fecal incontinence. Vicodin refilled .

## 2013-01-02 NOTE — Assessment & Plan Note (Addendum)
B12 is low secondary to lapse in supplementation for the past 6 months. Resuming b12 injections with 3 weekly starting today.

## 2013-01-02 NOTE — Assessment & Plan Note (Signed)
Well controlled on current regimen. Renal function stable, no changes today. 

## 2013-01-02 NOTE — Assessment & Plan Note (Signed)
UA is abnormal but she is not endorsing dysuria.  Awaiting urine culture prior to treatment given her multiple drug allergies.

## 2013-01-03 LAB — CULTURE, URINE COMPREHENSIVE: Colony Count: >=100000

## 2013-01-03 MED ORDER — CEFUROXIME AXETIL 250 MG PO TABS: 250.0000 mg | ORAL_TABLET | Freq: Two times a day (BID) | ORAL | Status: DC

## 2013-01-03 NOTE — Addendum Note (Signed)
Addended by: Sherlene Shams on: 01/03/2013 05:31 PM   Modules accepted: Orders, Medications

## 2013-01-06 ENCOUNTER — Telehealth: Payer: Self-pay

## 2013-01-06 NOTE — Telephone Encounter (Signed)
Called patient to inform her that she has a UTI. Pt wanted me to inform you that she passed out at the store on Saturday and that she did not want to go to the hospital. Patient said she is feeling better and she has been taking her UTI medication.

## 2013-01-07 ENCOUNTER — Telehealth: Payer: Self-pay

## 2013-01-07 ENCOUNTER — Ambulatory Visit (INDEPENDENT_AMBULATORY_CARE_PROVIDER_SITE_OTHER): Payer: Medicare Other | Admitting: *Deleted

## 2013-01-07 VITALS — BP 119/74 | HR 68

## 2013-01-07 DIAGNOSIS — I10 Essential (primary) hypertension: Secondary | ICD-10-CM

## 2013-01-07 NOTE — Telephone Encounter (Signed)
Open by a mistake 

## 2013-01-07 NOTE — Telephone Encounter (Signed)
Patient walked-in office today complaining of dizziness. Took vitals on patient 146/74, O2-100, P-71.........after 10 minutes 119/74, P-68, O2- 98. Patient was told to go to Urgent Care or to the ER, patient refused.

## 2013-01-09 ENCOUNTER — Ambulatory Visit (INDEPENDENT_AMBULATORY_CARE_PROVIDER_SITE_OTHER): Payer: Medicare Other | Admitting: *Deleted

## 2013-01-09 DIAGNOSIS — E538 Deficiency of other specified B group vitamins: Secondary | ICD-10-CM

## 2013-01-09 MED ORDER — CYANOCOBALAMIN 1000 MCG/ML IJ SOLN
1000.0000 ug | Freq: Once | INTRAMUSCULAR | Status: AC
  Administered 2013-01-09: 1000 ug via INTRAMUSCULAR

## 2013-01-10 ENCOUNTER — Other Ambulatory Visit: Payer: Medicare Other

## 2013-01-14 ENCOUNTER — Telehealth: Payer: Self-pay | Admitting: Internal Medicine

## 2013-01-14 ENCOUNTER — Ambulatory Visit (INDEPENDENT_AMBULATORY_CARE_PROVIDER_SITE_OTHER): Payer: Medicare Other | Admitting: *Deleted

## 2013-01-14 DIAGNOSIS — E538 Deficiency of other specified B group vitamins: Secondary | ICD-10-CM

## 2013-01-14 MED ORDER — CYANOCOBALAMIN 1000 MCG/ML IJ SOLN
1000.0000 ug | Freq: Once | INTRAMUSCULAR | Status: AC
  Administered 2013-01-14: 1000 ug via INTRAMUSCULAR

## 2013-01-14 MED ORDER — CYANOCOBALAMIN 1000 MCG SL SUBL: 1.0000 | SUBLINGUAL_TABLET | Freq: Every day | SUBLINGUAL | Status: DC

## 2013-01-14 NOTE — Telephone Encounter (Signed)
Pt stated she finished her 3rd b12 shot and wanted to know when she needed her lab work done

## 2013-01-14 NOTE — Telephone Encounter (Signed)
Not for a while.  She needs to start takign a daily sublingual tablet going forward,  since there is a b12 shortage .  I will send an rx to pharmacy

## 2013-01-14 NOTE — Telephone Encounter (Signed)
Patient notified as requested. 

## 2013-01-14 NOTE — Telephone Encounter (Signed)
Please advise patient last labs were 12/25/12

## 2013-01-15 ENCOUNTER — Other Ambulatory Visit: Payer: Self-pay | Admitting: *Deleted

## 2013-01-15 ENCOUNTER — Telehealth: Payer: Self-pay | Admitting: *Deleted

## 2013-01-15 ENCOUNTER — Other Ambulatory Visit (INDEPENDENT_AMBULATORY_CARE_PROVIDER_SITE_OTHER): Payer: Medicare Other

## 2013-01-15 DIAGNOSIS — Z1211 Encounter for screening for malignant neoplasm of colon: Secondary | ICD-10-CM

## 2013-01-15 DIAGNOSIS — D509 Iron deficiency anemia, unspecified: Secondary | ICD-10-CM

## 2013-01-15 NOTE — Assessment & Plan Note (Signed)
Stool is heme positive,  Will refer to GI for evaluation

## 2013-01-15 NOTE — Telephone Encounter (Signed)
Left message for pt to return my call.

## 2013-01-15 NOTE — Telephone Encounter (Signed)
Patient notified referral in process. 

## 2013-01-15 NOTE — Telephone Encounter (Signed)
Referral to GI in progress for evaluation of GI trract given heme positive stool and iron deficient anemia

## 2013-01-15 NOTE — Telephone Encounter (Signed)
[facility] called and pt IFOB came back Positive

## 2013-01-28 ENCOUNTER — Ambulatory Visit: Payer: Self-pay | Admitting: Unknown Physician Specialty

## 2013-01-30 NOTE — Progress Notes (Signed)
The encounter was used to check the patients BP. Confirmed with Montrice Floyd,CMA that the patient did not receive an injection.

## 2013-02-17 ENCOUNTER — Telehealth: Payer: Self-pay | Admitting: Internal Medicine

## 2013-02-17 NOTE — Telephone Encounter (Signed)
Pt insurance called stated Evelyn [name] was billed for 2 b12 shots.  DOS 12/31/12 and 01/09/13.  Pt stated she only received one b12 shot/rbh Please advise pt

## 2013-02-17 NOTE — Telephone Encounter (Signed)
Patient did receive B12 shot , on 12/31/12 and 01/09/13/ and another 01/14/13 verified with patient .

## 2013-02-18 ENCOUNTER — Emergency Department: Payer: Self-pay | Admitting: Emergency Medicine

## 2013-03-03 ENCOUNTER — Telehealth: Payer: Self-pay | Admitting: Internal Medicine

## 2013-03-03 NOTE — Telephone Encounter (Signed)
FYI Appointment with Raquel today.

## 2013-03-03 NOTE — Telephone Encounter (Signed)
Dr Festus Barren office sent over a recent urinalysis and culture but Didn't indicate if they treated her UTI.  I do not want to encourage patients to get UAs checked by other doctors offices with the expectatio nthat I will treat them without seeing them.  Does she have an appt? Please offer her an acute visit if she has not been treated and wants to be.

## 2013-03-03 NOTE — Telephone Encounter (Signed)
Scheduled patient with Orville Govern NP earliest appointment available 10 am 03/04/13. Patient stated she feels like her bottom is going to fall off.  Patient stated she also feels as though she has really bad cold. Stated she is afebrile. Dr. Festus Barren office didi not prescribe anything for uti stated by patient.

## 2013-03-03 NOTE — Telephone Encounter (Signed)
Ok,  i have given Lurena Joiner the UA and culture results from [place]

## 2013-03-04 ENCOUNTER — Encounter: Payer: Self-pay | Admitting: Adult Health

## 2013-03-04 ENCOUNTER — Telehealth: Payer: Self-pay | Admitting: *Deleted

## 2013-03-04 ENCOUNTER — Encounter: Payer: Self-pay | Admitting: Internal Medicine

## 2013-03-04 ENCOUNTER — Ambulatory Visit (INDEPENDENT_AMBULATORY_CARE_PROVIDER_SITE_OTHER): Payer: Medicare Other | Admitting: Adult Health

## 2013-03-04 VITALS — BP 110/62 | HR 71 | Temp 98.7°F | Resp 12 | Wt 128.0 lb

## 2013-03-04 DIAGNOSIS — R300 Dysuria: Secondary | ICD-10-CM

## 2013-03-04 DIAGNOSIS — R5383 Other fatigue: Secondary | ICD-10-CM

## 2013-03-04 DIAGNOSIS — R5381 Other malaise: Secondary | ICD-10-CM

## 2013-03-04 LAB — BASIC METABOLIC PANEL
BUN: 10 mg/dL (ref 6–23)
Chloride: 100 mEq/L (ref 96–112)
Potassium: 4.3 mEq/L (ref 3.5–5.1)

## 2013-03-04 LAB — CBC WITH DIFFERENTIAL/PLATELET
Basophils Absolute: 0 10*3/uL (ref 0.0–0.1)
Basophils Relative: 0.2 % (ref 0.0–3.0)
Eosinophils Absolute: 0.3 10*3/uL (ref 0.0–0.7)
Lymphocytes Relative: 9.8 % — ABNORMAL LOW (ref 12.0–46.0)
MCHC: 32.9 g/dL (ref 30.0–36.0)
MCV: 85.8 fl (ref 78.0–100.0)
Monocytes Absolute: 1.6 10*3/uL — ABNORMAL HIGH (ref 0.1–1.0)
Neutrophils Relative %: 79.9 % — ABNORMAL HIGH (ref 43.0–77.0)
Platelets: 411 10*3/uL — ABNORMAL HIGH (ref 150.0–400.0)
RBC: 3.93 Mil/uL (ref 3.87–5.11)
RDW: 17.2 % — ABNORMAL HIGH (ref 11.5–14.6)

## 2013-03-04 MED ORDER — NITROFURANTOIN MONOHYD MACRO 100 MG PO CAPS: 100.0000 mg | ORAL_CAPSULE | Freq: Two times a day (BID) | ORAL | Status: DC

## 2013-03-04 NOTE — Telephone Encounter (Signed)
Becky, Please call patient and let her know that her white count is elevated consisted with infection. This is secondary to her UTI which was found when she went to [facility] but they did not follow up on. She should have been started on an antibiotic when they tested her. She needs to make sure to take antibiotic I prescribed and then return for follow up to recheck white count in 2 weeks. If she feels worse she needs to call the office immediately.

## 2013-03-04 NOTE — Assessment & Plan Note (Addendum)
Patient is allergic to Cipro, penicillin and sulfa. Will treat urinary tract infection with Macrobid 100 mg twice a day x5 days. Check CBC and metabolic panel. RTC if symptoms do not improve within 3-4 days or sooner if needed.

## 2013-03-04 NOTE — Telephone Encounter (Signed)
Tonya from lab called reporting critical lab value WBC 19.3

## 2013-03-04 NOTE — Patient Instructions (Addendum)
  You have a urinary tract infection.  Start your antibiotic today. You will take 1 tablet twice a day for 5 days.  Drink plenty of fluids and rest.  Please have your blood work done prior to leaving the office.  We will call you with results once they are available.  Please call if you are not any better within 3-4 days or sooner if your symptoms worsen.

## 2013-03-04 NOTE — Progress Notes (Signed)
  Subjective:    Patient ID: Evelyn [name], female    DOB: 03-04-1920, 79 y.o.   MRN: [id number]  HPI  Patient presents with fatigue and urinary symptoms. Patient reports that she had urinalysis and urine culture done at [facility] on July 31. She presents to clinic with the results of the urine culture. She also reports that she was not started on any antibiotics. Results of the culture show greater than 100,000 colonies of Klebsiella. Patient denies fever, hematuria. She is overall not feeling well.   Current Outpatient Prescriptions on File Prior to Visit  Medication Sig Dispense Refill  . aspirin EC 81 MG tablet Take 81 mg by mouth daily.      . Cyanocobalamin 1000 MCG SUBL Place 1 tablet (1,000 mcg total) under the tongue daily.  90 tablet  3  . HYDROcodone-acetaminophen (NORCO/VICODIN) 5-325 MG per tablet Take 1 tablet by mouth every 6 (six) hours as needed for pain.  60 tablet  3  . levothyroxine (SYNTHROID, LEVOTHROID) 100 MCG tablet TAKE ONE TABLET BY MOUTH EVERY DAY  90 tablet  1  . mirtazapine (REMERON) 15 MG tablet Take 1 tablet (15 mg total) by mouth at bedtime.  30 tablet  4  . propranolol (INDERAL) 20 MG tablet Take 1 tablet (20 mg total) by mouth 2 (two) times daily.  180 tablet  3  . temazepam (RESTORIL) 15 MG capsule TAKE ONE CAPSULE BY MOUTH AT BEDTIME AS NEEDED FOR SLEEP  30 capsule  5  . [DISCONTINUED] esomeprazole (NEXIUM) 40 MG capsule Take 1 capsule (40 mg total) by mouth daily.  30 capsule  6   No current facility-administered medications on file prior to visit.    Review of Systems  Constitutional: Positive for fatigue. Negative for fever and chills.  HENT: Positive for rhinorrhea. Negative for congestion and sore throat.   Respiratory: Positive for cough.   Cardiovascular: Negative.   Genitourinary: Positive for dysuria, urgency, frequency and flank pain. Negative for hematuria.       Objective:   Physical Exam  Constitutional: She is oriented to  person, place, and time. She appears well-developed and well-nourished. No distress.  Appears that she is not feeling well.  HENT:  Head: Normocephalic and atraumatic.  Eyes: Conjunctivae and EOM are normal. Pupils are equal, round, and reactive to light.  Cardiovascular: Normal rate, regular rhythm, normal heart sounds and intact distal pulses.  Exam reveals no gallop and no friction rub.   No murmur heard. Pulmonary/Chest: Effort normal and breath sounds normal. No respiratory distress. She has no wheezes. She has no rales.  Abdominal: Soft. Bowel sounds are normal.  Genitourinary:  No suprapubic tenderness  Lymphadenopathy:    She has no cervical adenopathy.  Neurological: She is alert and oriented to person, place, and time.  Skin: Skin is warm and dry.  Psychiatric: She has a normal mood and affect. Her behavior is normal. Judgment normal.          Assessment & Plan:

## 2013-03-05 ENCOUNTER — Ambulatory Visit: Payer: Self-pay | Admitting: Oncology

## 2013-03-05 ENCOUNTER — Telehealth: Payer: Self-pay | Admitting: Internal Medicine

## 2013-03-05 ENCOUNTER — Other Ambulatory Visit: Payer: Self-pay | Admitting: Adult Health

## 2013-03-05 DIAGNOSIS — R899 Unspecified abnormal finding in specimens from other organs, systems and tissues: Secondary | ICD-10-CM

## 2013-03-05 NOTE — Telephone Encounter (Signed)
Pt was seen yesterday and had a bad bladder infection and she woke this morning with a bad sinus infection. She wasn't perscribed anything for the sinus infection and is wondering what she should do next for that or if anything could be sent in for her ??? She uses [facility] on [address].

## 2013-03-05 NOTE — Telephone Encounter (Signed)
Unless she develops a fever or begins to have greenish secretions she does not need antibiotic. Treatment for sinus is supportive - drink fluids to stay hydrated, use saline spray to try to flush sinuses. I will be glad to see her if she needs to be seen.

## 2013-03-05 NOTE — Telephone Encounter (Signed)
Acute visit with you on 03/04/13 for UTI please advise as to sinus infection.

## 2013-03-06 LAB — CBC CANCER CENTER
Basophil #: 0.2 x10 3/mm — ABNORMAL HIGH (ref 0.0–0.1)
Basophil %: 1.1 %
Eosinophil #: 0.3 x10 3/mm (ref 0.0–0.7)
Eosinophil %: 1.9 %
HCT: 30.6 % — ABNORMAL LOW (ref 35.0–47.0)
HGB: 10.3 g/dL — ABNORMAL LOW (ref 12.0–16.0)
Lymphocyte #: 2.3 x10 3/mm (ref 1.0–3.6)
MCH: 28.2 pg (ref 26.0–34.0)
Monocyte #: 1.8 x10 3/mm — ABNORMAL HIGH (ref 0.2–0.9)
Monocyte %: 10.5 %
Neutrophil %: 73.6 %
WBC: 17.5 x10 3/mm — ABNORMAL HIGH (ref 3.6–11.0)

## 2013-03-06 LAB — COMPREHENSIVE METABOLIC PANEL
Anion Gap: 10 (ref 7–16)
BUN: 11 mg/dL (ref 7–18)
Bilirubin,Total: 0.2 mg/dL (ref 0.2–1.0)
Calcium, Total: 8.7 mg/dL (ref 8.5–10.1)
EGFR (Non-African Amer.): 46 — ABNORMAL LOW
Osmolality: 274 (ref 275–301)
Potassium: 3.4 mmol/L — ABNORMAL LOW (ref 3.5–5.1)
SGPT (ALT): 14 U/L (ref 12–78)
Total Protein: 7.2 g/dL (ref 6.4–8.2)

## 2013-03-06 NOTE — Telephone Encounter (Signed)
Pt notified. States has a productive cough, denies fever, aches or chills. Denies any nasal congestion. Offered appointment with Raquel today to evaluate. States she has an appointment with Dr. Choski today and will be evaluated there, declined offer to be seen in office today. Advised to call back to schedule repeat labwork and urine check in one week.  

## 2013-03-06 NOTE — Telephone Encounter (Signed)
Pt notified. States has a productive cough, denies fever, aches or chills. Denies any nasal congestion. Offered appointment with Evelyn [name] today to evaluate. States she has an appointment with Dr. Koleen Nimrod today and will be evaluated there, declined offer to be seen in office today. Advised to call back to schedule repeat labwork and urine check in one week.

## 2013-03-10 ENCOUNTER — Encounter: Payer: Self-pay | Admitting: Internal Medicine

## 2013-03-11 ENCOUNTER — Inpatient Hospital Stay: Payer: Self-pay | Admitting: Specialist

## 2013-03-11 ENCOUNTER — Telehealth: Payer: Self-pay | Admitting: Internal Medicine

## 2013-03-11 ENCOUNTER — Ambulatory Visit: Payer: Self-pay | Admitting: Internal Medicine

## 2013-03-11 LAB — URINALYSIS, COMPLETE
Bilirubin,UR: NEGATIVE
Ketone: NEGATIVE
Nitrite: POSITIVE
Ph: 5 (ref 4.5–8.0)
Protein: NEGATIVE
Specific Gravity: 1.011 (ref 1.003–1.030)
WBC UR: 179 /HPF (ref 0–5)

## 2013-03-11 LAB — COMPREHENSIVE METABOLIC PANEL
Alkaline Phosphatase: 88 U/L (ref 50–136)
Anion Gap: 7 (ref 7–16)
BUN: 16 mg/dL (ref 7–18)
Calcium, Total: 9 mg/dL (ref 8.5–10.1)
Chloride: 102 mmol/L (ref 98–107)
Co2: 26 mmol/L (ref 21–32)
EGFR (Non-African Amer.): 47 — ABNORMAL LOW
Osmolality: 276 (ref 275–301)
Potassium: 3.9 mmol/L (ref 3.5–5.1)

## 2013-03-11 LAB — CBC
HCT: 35.6 % (ref 35.0–47.0)
MCH: 27.4 pg (ref 26.0–34.0)
Platelet: 575 10*3/uL — ABNORMAL HIGH (ref 150–440)
RBC: 4.3 10*6/uL (ref 3.80–5.20)
RDW: 16.4 % — ABNORMAL HIGH (ref 11.5–14.5)
WBC: 13.3 10*3/uL — ABNORMAL HIGH (ref 3.6–11.0)

## 2013-03-11 LAB — PRO B NATRIURETIC PEPTIDE: B-Type Natriuretic Peptide: 9245 pg/mL — ABNORMAL HIGH (ref 0–450)

## 2013-03-11 NOTE — Telephone Encounter (Signed)
Advice from triage nurse taken patient in route to ER. FYI called and verified in route.

## 2013-03-11 NOTE — Telephone Encounter (Signed)
Patient Information:  Caller Name: Dannielle Huh  Phone: [phone]  Patient: Evelyn [name], Evelyn [name]  Gender: Female  DOB: 08-05-1919  Age: 79 Years  PCP: Duncan Dull (Adults only)  Office Follow Up:  Does the office need to follow up with this patient?: No  Instructions For The Office: N/A  RN Note:  RN tried to schedule appt for pt but then realized that pt needs 45 minute appt slot.  Caller states pt feels like she is going to pass out.  RN advised for pt to be seen in the ER.  Symptoms  Reason For Call & Symptoms: pt was seen in the office on 03/04/13 for urinary tract symptoms.  Pt was treated with Macrobid.  Caller also reports [facility] and treated with antibiotic for cough and eye drainage.  Caller states pt is still very weak and he is concerned that UTI is not cleared up.  Caller states he called the [facility] and they referred her back to PCP to be rechecked. Caller spoke with office earlier and no appts were available.   Reviewed Health History In EMR: Yes  Reviewed Medications In EMR: Yes  Reviewed Allergies In EMR: Yes  Reviewed Surgeries / Procedures: Yes  Date of Onset of Symptoms: 03/04/2013  Guideline(s) Used:  Weakness (Generalized) and Fatigue  Disposition Per Guideline:   Go to ED Now (or to Office with PCP Approval)  Reason For Disposition Reached:   Patient sounds very sick or weak to the triager  Advice Given:  N/A  Patient Will Follow Care Advice:  YES

## 2013-03-11 NOTE — Telephone Encounter (Signed)
Advice from triage nurse taken patient in route to ER. FYI

## 2013-03-11 NOTE — Telephone Encounter (Signed)
Pt was seen last week by Raquel.  Dannielle Huh states pt was given medication to treat UTI.  Dannielle Huh states pt also had phlegm, puss in eyes and her R ear was hurting.  Pt saw Dr. Doylene Canning the next day.  Dr. Doylene Canning gave her an antibiotic but only for 5 days.  Pt not getting any better.  Dr. Aleda Grana office told them to get back with her PCP.  Also had given her a cup in case she needed to send a specimen.  States last night when she was getting ready to go to bed she said she felt like she may pass out.  Sat down in the bathroom with a cold sponge, felt better and went to bed.  Dannielle Huh wants her to be seen in the office again.  Transferred to triage, no openings today.

## 2013-03-12 LAB — BASIC METABOLIC PANEL
Anion Gap: 7 (ref 7–16)
Calcium, Total: 8.9 mg/dL (ref 8.5–10.1)
Chloride: 106 mmol/L (ref 98–107)
Creatinine: 1.05 mg/dL (ref 0.60–1.30)
EGFR (Non-African Amer.): 47 — ABNORMAL LOW
Glucose: 102 mg/dL — ABNORMAL HIGH (ref 65–99)
Osmolality: 282 (ref 275–301)

## 2013-03-12 LAB — CBC WITH DIFFERENTIAL/PLATELET
Basophil %: 0.9 %
Eosinophil %: 3 %
HCT: 32.7 % — ABNORMAL LOW (ref 35.0–47.0)
HGB: 10.9 g/dL — ABNORMAL LOW (ref 12.0–16.0)
Lymphocyte %: 25.4 %
MCHC: 33.4 g/dL (ref 32.0–36.0)
Monocyte #: 1.1 x10 3/mm — ABNORMAL HIGH (ref 0.2–0.9)
Monocyte %: 9.2 %
Neutrophil #: 7.4 10*3/uL — ABNORMAL HIGH (ref 1.4–6.5)
Neutrophil %: 61.5 %
Platelet: 572 10*3/uL — ABNORMAL HIGH (ref 150–440)
RDW: 16.4 % — ABNORMAL HIGH (ref 11.5–14.5)

## 2013-03-14 ENCOUNTER — Encounter: Payer: Self-pay | Admitting: Internal Medicine

## 2013-03-16 LAB — CULTURE, BLOOD (SINGLE)

## 2013-03-19 ENCOUNTER — Telehealth: Payer: Self-pay | Admitting: *Deleted

## 2013-03-19 NOTE — Telephone Encounter (Signed)
Patient needs appointment for hospital follow up left message on voicemail to call office.

## 2013-03-26 ENCOUNTER — Telehealth: Payer: Self-pay | Admitting: Internal Medicine

## 2013-03-26 NOTE — Telephone Encounter (Signed)
Pt called to make an [facility] follow up/  Made appointment for 04/09/13.  Pt would like to be sooner if possible.  i offered to give her an appointment with raquel she stated she wanted to see dr [name] Please advise

## 2013-03-31 ENCOUNTER — Ambulatory Visit: Payer: Self-pay | Admitting: Oncology

## 2013-04-01 NOTE — Telephone Encounter (Signed)
I do not see anything sooner unless someone cancels please advise.

## 2013-04-02 NOTE — Telephone Encounter (Signed)
Sept 10 is fine unless she is having fevers,  Vomiting,  Shortness of breath or chest pain

## 2013-04-09 ENCOUNTER — Encounter: Payer: Self-pay | Admitting: Internal Medicine

## 2013-04-09 ENCOUNTER — Ambulatory Visit (INDEPENDENT_AMBULATORY_CARE_PROVIDER_SITE_OTHER): Payer: Medicare Other | Admitting: Internal Medicine

## 2013-04-09 VITALS — BP 158/77 | HR 77 | Temp 98.1°F | Resp 14 | Ht 65.0 in | Wt 129.5 lb

## 2013-04-09 DIAGNOSIS — M6281 Muscle weakness (generalized): Secondary | ICD-10-CM

## 2013-04-09 DIAGNOSIS — N3020 Other chronic cystitis without hematuria: Secondary | ICD-10-CM

## 2013-04-09 DIAGNOSIS — R001 Bradycardia, unspecified: Secondary | ICD-10-CM

## 2013-04-09 DIAGNOSIS — I498 Other specified cardiac arrhythmias: Secondary | ICD-10-CM

## 2013-04-09 DIAGNOSIS — N390 Urinary tract infection, site not specified: Secondary | ICD-10-CM

## 2013-04-09 MED ORDER — PROPRANOLOL HCL 20 MG PO TABS: 10.0000 mg | ORAL_TABLET | Freq: Two times a day (BID) | ORAL | Status: DC

## 2013-04-09 NOTE — Progress Notes (Signed)
Patient ID: Evelyn [name], female   DOB: [birth year], 79 y.o.   MRN: [id number]  Patient Active Problem List   Diagnosis Date Noted  . Bradycardia 04/10/2013  . Generalized muscle weakness 04/10/2013  . Dysuria 03/04/2013  . Anemia, iron deficiency 12/27/2012  . Dysphagia, pharyngoesophageal phase 07/06/2012  . Anemia, pernicious 10/15/2011  . Spinal stenosis of lumbar region 10/15/2011  . Hypertension   . Hyperlipidemia   . Unspecified hypothyroidism   . Other chronic cystitis   . History of lymphoma   . Urinary tract infection 07/14/2011    Subjective:  CC:   Chief Complaint  Patient presents with  . Follow-up    skill nursing    HPI:   Evelyn [name] a 79 y.o. female who presents Hospital followup .  The patient was Discharged form [facility] last Tuesday with home health nursing and physical therapy.    she is accompanied by her daughter day today. Shewas admitted to hospital at [facility] on August 12 after developing weakness and presyncope following treatment of a UTI and subsequent development of sinusitis and conjunctivitis. Records are not available.  at the time it would tell me treated her in the office for UTI she was not endorsing any symptoms of cough purulent I. discharge or sinus infection. She was apparently treated by Dr. Sedalia Muta he one day later with the symptoms and symptoms did not improve so she was noted to progress to profound weakness and taken to the ER. Since she has been home she has been receiving physical therapy twice a week. She has been told she cannot drive until she has finished with home PT. He is asking if I will loosen with restrictions. She is not wearing her medic alert in the right. She is feeling weak but better than she did before. She continues to live independently. She has not had any falls recently. She has no interest in moving to assisted living despite her age and downward decline this year.   the PT  that was supposed to come out  last Friday had to cancel.  But the RN that came out last week and checked her pulse which was 50 ,  Then 70. She takes Inderal for management of head tremor    Past Medical History  Diagnosis Date  . Hyperlipidemia   . Thyroid disease     had thyroid removed  . Other chronic cystitis     Radiation , botox injections  . History of lymphoma   . Self-catheterizes urinary bladder     twice daily  . Coronary artery disease   . Coarse tremors     head shakes if does not take propranolol  . Insomnia     takes remeron nightly  . Cancer     lymphomia  . Anemia     hx of blood transfusion    Past Surgical History  Procedure Laterality Date  . Cardiac catheterization  2005    2 cardiac stents  . Abdominal hysterectomy  1970s  . Eye surgery      bilateral cataracts  . Joint replacement  2012    right hip  . Stomach surgery      removal all but small portion of stomach out due to ulcers  . Appendectomy    . Back surgery      lower back - bulging disk  . Spine surgery  2008    [place]  . Lumbar laminectomy/decompression microdiscectomy  01/24/2012  Procedure: LUMBAR LAMINECTOMY/DECOMPRESSION MICRODISCECTOMY 2 LEVELS;  Surgeon: Karn Cassis, MD;  Location: [facility];  Service: Neurosurgery;  Laterality: Right;  Right Lumbar four five, lumbar five sacral one foraminotomy   . Cystoscopy  April 2013       The following portions of the patient's history were reviewed and updated as appropriate: Allergies, current medications, and problem list.    Review of Systems:   12 Pt  review of systems was negative except those addressed in the HPI,     History   Social History  . Marital Status: Widowed    Spouse Name: N/A    Number of Children: N/A  . Years of Education: N/A   Occupational History  . Not on file.   Social History Main Topics  . Smoking status: Former Smoker    Quit date: 04/04/1971  . Smokeless tobacco: Never Used  . Alcohol Use: No  . Drug  Use: No  . Sexual Activity: Not on file   Other Topics Concern  . Not on file   Social History Narrative  . No narrative on file    Objective:  Filed Vitals:   04/09/13 1517  BP: 158/77  Pulse: 77  Temp: 98.1 F (36.7 C)  Resp: 14     General appearance: alert, cooperative and appears stated age Ears: normal TM's and external ear canals both ears Throat: lips, mucosa, and tongue normal; teeth and gums normal Neck: no adenopathy, no carotid bruit, supple, symmetrical, trachea midline and thyroid not enlarged, symmetric, no tenderness/mass/nodules Back: symmetric, no curvature. ROM normal. No CVA tenderness. Lungs: clear to auscultation bilaterally Heart: regular rate and rhythm, S1, S2 normal, no murmur, click, rub or gallop Abdomen: soft, non-tender; bowel sounds normal; no masses,  no organomegaly Pulses: 2+ and symmetric Skin: Skin color, texture, turgor normal. No rashes or lesions Lymph nodes: Cervical, supraclavicular, and axillary nodes normal.  Assessment and Plan:  Other chronic cystitis Patient's son is asking why we can't keep her on antibiotics continuously. He had a long discussion today about her risk for C. difficile colitis with daily antibiotics, as well as her risk for developing an antibiotic resistant UTI.  Urinary tract infection She was treated in-house for UTI. I have sent her home with a urinary specimen container to confirm resolution. We will not treat unless there are over 10 white blood cells in her urine and 100,000 colonies of a urinary pathogen.  Bradycardia Secondary to use of Inderal for control of head tremor. I reduced her dose to 10 mg twice daily.  Generalized muscle weakness Given her profound age and generalized weakness I have recommended that she consider moving to assisted living instead of remaining independent. She is adamantly opposed to this. She is now wearing her medic alert necklace. A long talk with her today about her  risk of falls and loss of independence should she have a hip fracture.   A total of 40 minutes was spent with patient more than half of which was spent in counseling, reviewing records from other prviders and coordination of care.  Updated Medication List Outpatient Encounter Prescriptions as of 04/09/2013  Medication Sig Dispense Refill  . aspirin EC 81 MG tablet Take 81 mg by mouth daily.      . Cyanocobalamin 1000 MCG SUBL Place 1 tablet (1,000 mcg total) under the tongue daily.  90 tablet  3  . HYDROcodone-acetaminophen (NORCO/VICODIN) 5-325 MG per tablet Take 1 tablet by mouth every 6 (six) hours  as needed for pain.  60 tablet  3  . levothyroxine (SYNTHROID, LEVOTHROID) 100 MCG tablet TAKE ONE TABLET BY MOUTH EVERY DAY  90 tablet  1  . mirtazapine (REMERON) 15 MG tablet Take 1 tablet (15 mg total) by mouth at bedtime.  30 tablet  4  . nitrofurantoin, macrocrystal-monohydrate, (MACROBID) 100 MG capsule Take 1 capsule (100 mg total) by mouth 2 (two) times daily.  10 capsule  0  . propranolol (INDERAL) 20 MG tablet Take 0.5 tablets (10 mg total) by mouth 2 (two) times daily.  60 tablet  3  . temazepam (RESTORIL) 15 MG capsule TAKE ONE CAPSULE BY MOUTH AT BEDTIME AS NEEDED FOR SLEEP  30 capsule  5  . [DISCONTINUED] propranolol (INDERAL) 20 MG tablet Take 1 tablet (20 mg total) by mouth 2 (two) times daily.  180 tablet  3  . oxyCODONE-acetaminophen (PERCOCET/ROXICET) 5-325 MG per tablet        No facility-administered encounter medications on file as of 04/09/2013.     No orders of the defined types were placed in this encounter.    No Follow-up on file.

## 2013-04-09 NOTE — Patient Instructions (Addendum)
I recommend reduceing your inderal to 10 mg twice daily, becaseu it is slowing your heart rate down  Please brink back a urine sample so we can check it.  Please start taking a cranberry tablet and a probiotic daily to help prevent infection    NEVER take your Alert necklace off!  Do not drive you car until Physical Therapy finishes with you  Please consider transitioning to assisted living arrangement

## 2013-04-10 ENCOUNTER — Other Ambulatory Visit (INDEPENDENT_AMBULATORY_CARE_PROVIDER_SITE_OTHER): Payer: Medicare Other

## 2013-04-10 DIAGNOSIS — N390 Urinary tract infection, site not specified: Secondary | ICD-10-CM

## 2013-04-10 DIAGNOSIS — R001 Bradycardia, unspecified: Secondary | ICD-10-CM | POA: Insufficient documentation

## 2013-04-10 DIAGNOSIS — M6281 Muscle weakness (generalized): Secondary | ICD-10-CM | POA: Insufficient documentation

## 2013-04-10 LAB — POCT URINALYSIS DIPSTICK
Ketones, UA: NEGATIVE
Nitrite, UA: NEGATIVE
Protein, UA: NEGATIVE
Urobilinogen, UA: 0.2

## 2013-04-10 NOTE — Assessment & Plan Note (Signed)
She was treated in-house for UTI. I have sent her home with a urinary specimen container to confirm resolution. We will not treat unless there are over 10 white blood cells in her urine and 100,000 colonies of a urinary pathogen.

## 2013-04-10 NOTE — Assessment & Plan Note (Signed)
Given her profound age and generalized weakness I have recommended that she consider moving to assisted living instead of remaining independent. She is adamantly opposed to this. She is now wearing her medic alert necklace. A long talk with her today about her risk of falls and loss of independence should she have a hip fracture.

## 2013-04-10 NOTE — Assessment & Plan Note (Signed)
Secondary to use of Inderal for control of head tremor. I reduced her dose to 10 mg twice daily.

## 2013-04-10 NOTE — Assessment & Plan Note (Signed)
Patient's son is asking why we can't keep her on antibiotics continuously. He had a long discussion today about her risk for C. difficile colitis with daily antibiotics, as well as her risk for developing an antibiotic resistant UTI.

## 2013-04-11 ENCOUNTER — Telehealth: Payer: Self-pay | Admitting: Internal Medicine

## 2013-04-11 ENCOUNTER — Other Ambulatory Visit: Payer: Self-pay | Admitting: Internal Medicine

## 2013-04-11 MED ORDER — PHENAZOPYRIDINE HCL 200 MG PO TABS: 200.0000 mg | ORAL_TABLET | Freq: Three times a day (TID) | ORAL | Status: DC | PRN

## 2013-04-11 MED ORDER — CIPROFLOXACIN HCL 250 MG PO TABS: 250.0000 mg | ORAL_TABLET | Freq: Two times a day (BID) | ORAL | Status: DC

## 2013-04-11 NOTE — Telephone Encounter (Signed)
Patient notified

## 2013-04-11 NOTE — Telephone Encounter (Signed)
Maia Plan a nurse from [facility] care is calling and saying the patient was recently in here and they took a urine sample but patient has not received results and she is voiding constantly and is in pain. Nurse wants to know if an antibiotic could be sent in for patient at [facility] on [address] As soon as possible. Nurse was wondering if patient could get antibiotic and something for pain in the bladder.

## 2013-04-11 NOTE — Telephone Encounter (Signed)
Nurse stated patient having dysuria.

## 2013-04-11 NOTE — Telephone Encounter (Signed)
cipro sent to [facility].  Also sending medicine for bladder pain

## 2013-04-13 LAB — URINE CULTURE

## 2013-04-14 MED ORDER — NITROFURANTOIN MONOHYD MACRO 100 MG PO CAPS: 100.0000 mg | ORAL_CAPSULE | Freq: Two times a day (BID) | ORAL | Status: DC

## 2013-04-14 NOTE — Addendum Note (Signed)
Addended by: Sherlene Shams on: 04/14/2013 09:15 AM   Modules accepted: Orders

## 2013-05-30 ENCOUNTER — Other Ambulatory Visit: Payer: Self-pay | Admitting: Internal Medicine

## 2013-06-24 ENCOUNTER — Other Ambulatory Visit: Payer: Self-pay | Admitting: Internal Medicine

## 2013-07-25 ENCOUNTER — Other Ambulatory Visit: Payer: Self-pay | Admitting: Internal Medicine

## 2013-07-25 NOTE — Telephone Encounter (Signed)
Could you please approve this? 

## 2013-07-25 NOTE — Telephone Encounter (Signed)
Pt states she is out of her sleep medication, temazepam.  Asking if this can be called in asap, states she does not have enough to get her through the weekend.  Pt asking for a call when this has been called to pharmacy so she knows to go get it.

## 2013-07-27 MED ORDER — TEMAZEPAM 15 MG PO CAPS: 15.0000 mg | ORAL_CAPSULE | Freq: Every evening | ORAL | Status: DC | PRN

## 2013-07-28 NOTE — Telephone Encounter (Signed)
Prescription was called in to the [place] pharmacy on [address]

## 2013-09-18 ENCOUNTER — Other Ambulatory Visit: Payer: Self-pay | Admitting: Internal Medicine

## 2013-09-22 ENCOUNTER — Ambulatory Visit: Payer: Self-pay | Admitting: Oncology

## 2013-09-29 ENCOUNTER — Ambulatory Visit: Payer: Self-pay | Admitting: Oncology

## 2013-09-29 LAB — CBC CANCER CENTER
BASOS PCT: 0.1 %
Basophil #: 0 x10 3/mm (ref 0.0–0.1)
Eosinophil #: 0.5 x10 3/mm (ref 0.0–0.7)
Eosinophil %: 5.1 %
HCT: 36.3 % (ref 35.0–47.0)
HGB: 11.6 g/dL — AB (ref 12.0–16.0)
Lymphocyte #: 3.3 x10 3/mm (ref 1.0–3.6)
Lymphocyte %: 30.6 %
MCH: 27.4 pg (ref 26.0–34.0)
MCHC: 31.9 g/dL — ABNORMAL LOW (ref 32.0–36.0)
MCV: 86 fL (ref 80–100)
MONO ABS: 0.8 x10 3/mm (ref 0.2–0.9)
Monocyte %: 7.9 %
Neutrophil #: 6 x10 3/mm (ref 1.4–6.5)
Neutrophil %: 56.3 %
PLATELETS: 351 x10 3/mm (ref 150–440)
RBC: 4.23 10*6/uL (ref 3.80–5.20)
RDW: 16.7 % — ABNORMAL HIGH (ref 11.5–14.5)
WBC: 10.6 x10 3/mm (ref 3.6–11.0)

## 2013-09-29 LAB — COMPREHENSIVE METABOLIC PANEL
ALT: 20 U/L (ref 12–78)
ANION GAP: 11 (ref 7–16)
AST: 16 U/L (ref 15–37)
Albumin: 3.3 g/dL — ABNORMAL LOW (ref 3.4–5.0)
Alkaline Phosphatase: 74 U/L
BILIRUBIN TOTAL: 0.2 mg/dL (ref 0.2–1.0)
BUN: 17 mg/dL (ref 7–18)
CALCIUM: 9.6 mg/dL (ref 8.5–10.1)
Chloride: 102 mmol/L (ref 98–107)
Co2: 25 mmol/L (ref 21–32)
Creatinine: 1.21 mg/dL (ref 0.60–1.30)
EGFR (African American): 45 — ABNORMAL LOW
GFR CALC NON AF AMER: 39 — AB
GLUCOSE: 125 mg/dL — AB (ref 65–99)
OSMOLALITY: 279 (ref 275–301)
POTASSIUM: 4.5 mmol/L (ref 3.5–5.1)
Sodium: 138 mmol/L (ref 136–145)
Total Protein: 6.9 g/dL (ref 6.4–8.2)

## 2013-10-07 ENCOUNTER — Other Ambulatory Visit: Payer: Self-pay | Admitting: *Deleted

## 2013-10-07 MED ORDER — PROPRANOLOL HCL 20 MG PO TABS: 10.0000 mg | ORAL_TABLET | Freq: Two times a day (BID) | ORAL | Status: DC

## 2013-10-07 MED ORDER — LEVOTHYROXINE SODIUM 100 MCG PO TABS: ORAL_TABLET | ORAL | Status: DC

## 2013-10-07 NOTE — Telephone Encounter (Signed)
Refill Request  Omeprazole

## 2013-10-09 ENCOUNTER — Other Ambulatory Visit: Payer: Self-pay | Admitting: *Deleted

## 2013-10-09 ENCOUNTER — Telehealth: Payer: Self-pay | Admitting: *Deleted

## 2013-10-09 MED ORDER — MIRTAZAPINE 15 MG PO TABS: ORAL_TABLET | ORAL | Status: DC

## 2013-10-09 NOTE — Telephone Encounter (Signed)
Refill request:    Omeprazole

## 2013-10-09 NOTE — Telephone Encounter (Signed)
Ok refill? Not on current med list 

## 2013-10-10 MED ORDER — OMEPRAZOLE 40 MG PO CPDR: 40.0000 mg | DELAYED_RELEASE_CAPSULE | Freq: Every day | ORAL | Status: DC

## 2013-10-10 NOTE — Telephone Encounter (Signed)
Sent to mail order

## 2013-10-16 ENCOUNTER — Telehealth: Payer: Self-pay | Admitting: *Deleted

## 2013-10-16 ENCOUNTER — Telehealth: Payer: Self-pay | Admitting: Internal Medicine

## 2013-10-16 NOTE — Telephone Encounter (Signed)
[facility] nurse making home visit was advised  by patient she had a black stool last week and the nurse is required to report to patient MD. Hulen Skains patient stated she has had only one one black stool,but has bouts of diarrhea 2 -4 BM every morning. No pain or bloating. I have an acute spot at 11.15 but chart says allow 45 min, or do you want patient just to pick up a IFOB/

## 2013-10-17 NOTE — Telephone Encounter (Signed)
Yes, have her pick up the FOBT  For now

## 2013-10-17 NOTE — Telephone Encounter (Signed)
Patient to pick up FOBT today. Will advise patient how to use.

## 2013-10-20 ENCOUNTER — Other Ambulatory Visit (INDEPENDENT_AMBULATORY_CARE_PROVIDER_SITE_OTHER): Payer: Medicare Other

## 2013-10-20 DIAGNOSIS — N390 Urinary tract infection, site not specified: Secondary | ICD-10-CM

## 2013-10-20 LAB — POCT URINALYSIS DIPSTICK
BILIRUBIN UA: NEGATIVE
Glucose, UA: NEGATIVE
KETONES UA: NEGATIVE
Nitrite, UA: POSITIVE
PROTEIN UA: NEGATIVE
SPEC GRAV UA: 1.02
UROBILINOGEN UA: 0.2
pH, UA: 5.5

## 2013-10-21 ENCOUNTER — Telehealth: Payer: Self-pay | Admitting: *Deleted

## 2013-10-21 ENCOUNTER — Other Ambulatory Visit: Payer: Medicare Other

## 2013-10-21 DIAGNOSIS — K921 Melena: Secondary | ICD-10-CM

## 2013-10-21 NOTE — Telephone Encounter (Signed)
Corrected IFOB order placed

## 2013-10-21 NOTE — Telephone Encounter (Signed)
ifob ordered

## 2013-10-22 ENCOUNTER — Telehealth: Payer: Self-pay | Admitting: Internal Medicine

## 2013-10-22 ENCOUNTER — Other Ambulatory Visit (INDEPENDENT_AMBULATORY_CARE_PROVIDER_SITE_OTHER): Payer: Medicare Other

## 2013-10-22 DIAGNOSIS — K921 Melena: Secondary | ICD-10-CM

## 2013-10-22 LAB — URINE CULTURE

## 2013-10-22 LAB — FECAL OCCULT BLOOD, IMMUNOCHEMICAL: FECAL OCCULT BLD: POSITIVE — AB

## 2013-10-22 MED ORDER — MIRTAZAPINE 15 MG PO TABS: ORAL_TABLET | ORAL | Status: DC

## 2013-10-22 MED ORDER — TEMAZEPAM 15 MG PO CAPS: 15.0000 mg | ORAL_CAPSULE | Freq: Every evening | ORAL | Status: DC | PRN

## 2013-10-22 MED ORDER — CEPHALEXIN 500 MG PO CAPS: 500.0000 mg | ORAL_CAPSULE | Freq: Three times a day (TID) | ORAL | Status: DC

## 2013-10-22 NOTE — Telephone Encounter (Signed)
Patient notified and also notified of UTI and abx called to pharmacy.

## 2013-10-22 NOTE — Telephone Encounter (Signed)
Ok to fill 

## 2013-10-22 NOTE — Telephone Encounter (Signed)
Ok to refill,  Refill sent  

## 2013-10-22 NOTE — Telephone Encounter (Signed)
The patient only has 1 pill left. Please call into the pharmacy today.  temazepam (RESTORIL) 15 MG capsule  mirtazapine (REMERON) 15 MG tablet

## 2013-10-23 ENCOUNTER — Ambulatory Visit (INDEPENDENT_AMBULATORY_CARE_PROVIDER_SITE_OTHER): Payer: Medicare Other | Admitting: Internal Medicine

## 2013-10-23 ENCOUNTER — Encounter: Payer: Self-pay | Admitting: Internal Medicine

## 2013-10-23 ENCOUNTER — Encounter: Payer: Self-pay | Admitting: Emergency Medicine

## 2013-10-23 VITALS — BP 134/68 | HR 87 | Temp 97.7°F | Resp 16 | Wt 131.5 lb

## 2013-10-23 DIAGNOSIS — N390 Urinary tract infection, site not specified: Secondary | ICD-10-CM

## 2013-10-23 DIAGNOSIS — R195 Other fecal abnormalities: Secondary | ICD-10-CM

## 2013-10-23 DIAGNOSIS — D509 Iron deficiency anemia, unspecified: Secondary | ICD-10-CM

## 2013-10-23 MED ORDER — OXYBUTYNIN CHLORIDE 5 MG PO TABS: 5.0000 mg | ORAL_TABLET | Freq: Three times a day (TID) | ORAL | Status: DC

## 2013-10-23 MED ORDER — CEPHALEXIN 500 MG PO CAPS: 500.0000 mg | ORAL_CAPSULE | Freq: Three times a day (TID) | ORAL | Status: DC

## 2013-10-23 MED ORDER — ESTROGENS, CONJUGATED 0.625 MG/GM VA CREA: TOPICAL_CREAM | VAGINAL | Status: DC

## 2013-10-23 MED ORDER — PROPRANOLOL HCL 20 MG PO TABS: 10.0000 mg | ORAL_TABLET | Freq: Two times a day (BID) | ORAL | Status: DC

## 2013-10-23 MED ORDER — LEVOTHYROXINE SODIUM 100 MCG PO TABS: ORAL_TABLET | ORAL | Status: DC

## 2013-10-23 NOTE — Patient Instructions (Signed)
I am sending you to [facility] for evaluation of your heme positive stool test  Go back to [facility] to get your Keflex for UTI  You are supposed to be takign Ditropan for bladder spasm and premarin vaginal cream for vaginitis caused by menopause  Your thyroid medicine and propranolol were refilled on March 10th,  But I have sent another 90 day refill to [facility]

## 2013-10-23 NOTE — Progress Notes (Signed)
Pre-visit discussion using our clinic review tool. No additional management support is needed unless otherwise documented below in the visit note.  

## 2013-10-23 NOTE — Progress Notes (Signed)
Patient ID: Evelyn [name], female   DOB: Jan 22, 1920, 80 y.o.   MRN: [id number]  Patient Active Problem List   Diagnosis Date Noted  . Bradycardia 04/10/2013  . Generalized muscle weakness 04/10/2013  . Dysuria 03/04/2013  . Anemia, iron deficiency 12/27/2012  . Dysphagia, pharyngoesophageal phase 07/06/2012  . Anemia, pernicious 10/15/2011  . Spinal stenosis of lumbar region 10/15/2011  . Hypertension   . Hyperlipidemia   . Unspecified hypothyroidism   . Other chronic cystitis   . History of lymphoma   . Urinary tract infection 07/14/2011    Subjective:  CC:   Chief Complaint  Patient presents with  . Acute Visit    For blood in stool positive IFOB    HPI:   Evelyn [name] is a 80 y.o. female who presents for Follow up on recent positive FOBT. She has been having rectal pain,  2 to 3  bms  Every morning, not always solid,  Sometimes very loose ,  Sees pink but not red blood on paper   She has also requested treatment for UTI due to dysuria.  Urine culture was positive.    Past Medical History  Diagnosis Date  . Hyperlipidemia   . Thyroid disease     had thyroid removed  . Other chronic cystitis     Radiation , botox injections  . History of lymphoma   . Self-catheterizes urinary bladder     twice daily  . Coronary artery disease   . Coarse tremors     head shakes if does not take propranolol  . Insomnia     takes remeron nightly  . Cancer     lymphomia  . Anemia     hx of blood transfusion    Past Surgical History  Procedure Laterality Date  . Cardiac catheterization  2005    2 cardiac stents  . Abdominal hysterectomy  1970s  . Eye surgery      bilateral cataracts  . Joint replacement  2012    right hip  . Stomach surgery      removal all but small portion of stomach out due to ulcers  . Appendectomy    . Back surgery      lower back - bulging disk  . Spine surgery  2008    [place]  . Lumbar laminectomy/decompression microdiscectomy   01/24/2012    Procedure: LUMBAR LAMINECTOMY/DECOMPRESSION MICRODISCECTOMY 2 LEVELS;  Surgeon: Evelyn Stakes, MD;  Location: [facility];  Service: Neurosurgery;  Laterality: Right;  Right Lumbar four five, lumbar five sacral one foraminotomy   . Cystoscopy  April 2013       The following portions of the patient's history were reviewed and updated as appropriate: Allergies, current medications, and problem list.    Review of Systems:   Patient denies headache, fevers, malaise, unintentional weight loss, skin rash, eye pain, sinus congestion and sinus pain, sore throat, dysphagia,  hemoptysis , cough, dyspnea, wheezing, chest pain, palpitations, orthopnea, edema, abdominal pain, nausea, melena, diarrhea, constipation, flank pain, dysuria, hematuria, urinary  Frequency, nocturia, numbness, tingling, seizures,  Focal weakness, Loss of consciousness,  Tremor, insomnia, depression, anxiety, and suicidal ideation.     History   Social History  . Marital Status: Widowed    Spouse Name: N/A    Number of Children: N/A  . Years of Education: N/A   Occupational History  . Not on file.   Social History Main Topics  . Smoking status: Former Smoker  Quit date: 04/04/1971  . Smokeless tobacco: Never Used  . Alcohol Use: No  . Drug Use: No  . Sexual Activity: Not on file   Other Topics Concern  . Not on file   Social History Narrative  . No narrative on file    Objective:  Filed Vitals:   10/23/13 1459  BP: 134/68  Pulse: 87  Temp: 97.7 F (36.5 C)  Resp: 16     General appearance: alert, cooperative and appears stated age Ears: normal TM's and external ear canals both ears Throat: lips, mucosa, and tongue normal; teeth and gums normal Neck: no adenopathy, no carotid bruit, supple, symmetrical, trachea midline and thyroid not enlarged, symmetric, no tenderness/mass/nodules Back: symmetric, no curvature. ROM normal. No CVA tenderness. Lungs: clear to auscultation  bilaterally Heart: regular rate and rhythm, S1, S2 normal, no murmur, click, rub or gallop Abdomen: soft, non-tender; bowel sounds normal; no masses,  no organomegaly Pulses: 2+ and symmetric Skin: Skin color, texture, turgor normal. No rashes or lesions Lymph nodes: Cervical, supraclavicular, and axillary nodes normal.  Assessment and Plan:  Anemia, iron deficiency With positive FOBT .  Referral to GI for evaluation.   Urinary tract infection She has symptoms and a positive culture.  Treating with keflex.   A total of 25 minutes was spent with patient more than half of which was spent in counseling, reviewing records from other prviders and coordination of care.   Updated Medication List Outpatient Encounter Prescriptions as of 10/23/2013  Medication Sig  . aspirin EC 81 MG tablet Take 81 mg by mouth daily.  . Cyanocobalamin 1000 MCG SUBL Place 1 tablet (1,000 mcg total) under the tongue daily.  Marland Kitchen HYDROcodone-acetaminophen (NORCO/VICODIN) 5-325 MG per tablet Take 1 tablet by mouth every 6 (six) hours as needed for pain.  Marland Kitchen levothyroxine (SYNTHROID, LEVOTHROID) 100 MCG tablet TAKE ONE TABLET BY MOUTH EVERY DAY  . mirtazapine (REMERON) 15 MG tablet TAKE ONE TABLET BY MOUTH AT BEDTIME  . propranolol (INDERAL) 20 MG tablet Take 0.5 tablets (10 mg total) by mouth 2 (two) times daily.  . temazepam (RESTORIL) 15 MG capsule Take 1 capsule (15 mg total) by mouth at bedtime as needed for sleep.  . [DISCONTINUED] levothyroxine (SYNTHROID, LEVOTHROID) 100 MCG tablet TAKE ONE TABLET BY MOUTH EVERY DAY  . [DISCONTINUED] levothyroxine (SYNTHROID, LEVOTHROID) 100 MCG tablet TAKE ONE TABLET BY MOUTH ONCE DAILY  . [DISCONTINUED] propranolol (INDERAL) 20 MG tablet Take 0.5 tablets (10 mg total) by mouth 2 (two) times daily.  . cephALEXin (KEFLEX) 500 MG capsule Take 1 capsule (500 mg total) by mouth 3 (three) times daily.  Marland Kitchen conjugated estrogens (PREMARIN) vaginal cream Place 1 applicator in vagina  every night for two weeks,  Then two times per week thereafter  . omeprazole (PRILOSEC) 40 MG capsule Take 1 capsule (40 mg total) by mouth daily.  Marland Kitchen oxybutynin (DITROPAN) 5 MG tablet Take 1 tablet (5 mg total) by mouth 3 (three) times daily. For bladder spasm  . oxyCODONE-acetaminophen (PERCOCET/ROXICET) 5-325 MG per tablet   . phenazopyridine (PYRIDIUM) 200 MG tablet Take 1 tablet (200 mg total) by mouth 3 (three) times daily as needed for pain.  . [DISCONTINUED] cephALEXin (KEFLEX) 500 MG capsule Take 1 capsule (500 mg total) by mouth 3 (three) times daily.  . [DISCONTINUED] ciprofloxacin (CIPRO) 250 MG tablet Take 1 tablet (250 mg total) by mouth 2 (two) times daily.  . [DISCONTINUED] nitrofurantoin, macrocrystal-monohydrate, (MACROBID) 100 MG capsule Take 1 capsule (100 mg total)  by mouth 2 (two) times daily.  . [DISCONTINUED] nitrofurantoin, macrocrystal-monohydrate, (MACROBID) 100 MG capsule Take 1 capsule (100 mg total) by mouth 2 (two) times daily.     Orders Placed This Encounter  Procedures  . Ambulatory referral to Gastroenterology    Return in about 4 weeks (around 11/20/2013).

## 2013-10-26 ENCOUNTER — Encounter: Payer: Self-pay | Admitting: Internal Medicine

## 2013-10-26 NOTE — Assessment & Plan Note (Signed)
She has sympotms and a ositive culture.  Treating with keflex.

## 2013-10-26 NOTE — Assessment & Plan Note (Signed)
With positive FOBT .  Referral to GI for evaluation.

## 2013-10-28 ENCOUNTER — Telehealth: Payer: Self-pay | Admitting: *Deleted

## 2013-10-28 NOTE — Telephone Encounter (Signed)
We are aware of that,  She has tolerated keflex in the very recent past without an allergic reaqction

## 2013-10-28 NOTE — Telephone Encounter (Signed)
Pharmacy Note:   The patient has indicated an allergy to Penicillins and has been prescribed Cephalexin 500 mg.

## 2013-10-29 ENCOUNTER — Ambulatory Visit: Payer: Self-pay | Admitting: Oncology

## 2013-11-21 ENCOUNTER — Encounter: Payer: Self-pay | Admitting: Internal Medicine

## 2013-11-21 ENCOUNTER — Ambulatory Visit (INDEPENDENT_AMBULATORY_CARE_PROVIDER_SITE_OTHER): Payer: Medicare Other | Admitting: Internal Medicine

## 2013-11-21 VITALS — BP 124/62 | HR 64 | Temp 98.2°F | Resp 14 | Wt 127.5 lb

## 2013-11-21 DIAGNOSIS — R5383 Other fatigue: Secondary | ICD-10-CM

## 2013-11-21 DIAGNOSIS — E559 Vitamin D deficiency, unspecified: Secondary | ICD-10-CM

## 2013-11-21 DIAGNOSIS — R5381 Other malaise: Secondary | ICD-10-CM

## 2013-11-21 DIAGNOSIS — I10 Essential (primary) hypertension: Secondary | ICD-10-CM

## 2013-11-21 DIAGNOSIS — G4700 Insomnia, unspecified: Secondary | ICD-10-CM

## 2013-11-21 DIAGNOSIS — Z9181 History of falling: Secondary | ICD-10-CM

## 2013-11-21 DIAGNOSIS — E039 Hypothyroidism, unspecified: Secondary | ICD-10-CM

## 2013-11-21 DIAGNOSIS — Z79899 Other long term (current) drug therapy: Secondary | ICD-10-CM

## 2013-11-21 DIAGNOSIS — F5104 Psychophysiologic insomnia: Secondary | ICD-10-CM

## 2013-11-21 LAB — COMPLETE METABOLIC PANEL WITH GFR
ALT: 9 U/L (ref 0–35)
AST: 14 U/L (ref 0–37)
Albumin: 3.6 g/dL (ref 3.5–5.2)
Alkaline Phosphatase: 63 U/L (ref 39–117)
BUN: 23 mg/dL (ref 6–23)
CALCIUM: 8.9 mg/dL (ref 8.4–10.5)
CO2: 26 meq/L (ref 19–32)
CREATININE: 1.08 mg/dL (ref 0.50–1.10)
Chloride: 99 mEq/L (ref 96–112)
GFR, Est African American: 52 mL/min — ABNORMAL LOW
GFR, Est Non African American: 45 mL/min — ABNORMAL LOW
Glucose, Bld: 81 mg/dL (ref 70–99)
POTASSIUM: 4.6 meq/L (ref 3.5–5.3)
SODIUM: 136 meq/L (ref 135–145)
TOTAL PROTEIN: 6 g/dL (ref 6.0–8.3)
Total Bilirubin: 0.4 mg/dL (ref 0.2–1.2)

## 2013-11-21 LAB — TSH: TSH: 5.27 u[IU]/mL (ref 0.35–5.50)

## 2013-11-21 LAB — CBC WITH DIFFERENTIAL/PLATELET
BASOS PCT: 0.5 % (ref 0.0–3.0)
Basophils Absolute: 0 10*3/uL (ref 0.0–0.1)
EOS ABS: 0.3 10*3/uL (ref 0.0–0.7)
EOS PCT: 2.5 % (ref 0.0–5.0)
HEMATOCRIT: 34.2 % — AB (ref 36.0–46.0)
Hemoglobin: 11.2 g/dL — ABNORMAL LOW (ref 12.0–15.0)
LYMPHS ABS: 2.6 10*3/uL (ref 0.7–4.0)
Lymphocytes Relative: 24.2 % (ref 12.0–46.0)
MCHC: 32.8 g/dL (ref 30.0–36.0)
MCV: 87 fl (ref 78.0–100.0)
MONO ABS: 0.9 10*3/uL (ref 0.1–1.0)
Monocytes Relative: 8.3 % (ref 3.0–12.0)
Neutro Abs: 7 10*3/uL (ref 1.4–7.7)
Neutrophils Relative %: 64.5 % (ref 43.0–77.0)
Platelets: 412 10*3/uL — ABNORMAL HIGH (ref 150.0–400.0)
RBC: 3.93 Mil/uL (ref 3.87–5.11)
RDW: 16.3 % — ABNORMAL HIGH (ref 11.5–14.6)
WBC: 10.9 10*3/uL — AB (ref 4.5–10.5)

## 2013-11-21 MED ORDER — PROPRANOLOL HCL 20 MG PO TABS: 20.0000 mg | ORAL_TABLET | Freq: Two times a day (BID) | ORAL | Status: DC

## 2013-11-21 MED ORDER — TEMAZEPAM 22.5 MG PO CAPS: 22.5000 mg | ORAL_CAPSULE | Freq: Every evening | ORAL | Status: DC | PRN

## 2013-11-21 NOTE — Progress Notes (Signed)
Patient ID: Evelyn [name], female   DOB: Mar 25, 1920, 80 y.o.   MRN: [id number]  Patient Active Problem List   Diagnosis Date Noted  . Chronic insomnia 11/23/2013  . History of recent fall 11/23/2013  . Bradycardia 04/10/2013  . Generalized muscle weakness 04/10/2013  . Dysuria 03/04/2013  . Anemia, iron deficiency 12/27/2012  . Dysphagia, pharyngoesophageal phase 07/06/2012  . Anemia, pernicious 10/15/2011  . Spinal stenosis of lumbar region 10/15/2011  . Hypertension   . Hyperlipidemia   . Unspecified hypothyroidism   . Other chronic cystitis   . History of lymphoma   . Urinary tract infection 07/14/2011    Subjective:  CC:   Chief Complaint  Patient presents with  . Acute Visit    right hip and leg  pain from a fall on 11/19/13    HPI:   Evelyn [name] is a 80 y.o. female who presents for Follow up on chronic conditions  Including  Pernicious anemia, hypothyroidism , spinal stenosis and recurrent cystitis. .    Had a fall at home earlier in the week,  Arms are bruised and her right hip is sore but no bruising noted.  Had her medi alert necklace on but did not use it because she was able to  pull herself up .  Fall occurred when pulling shirt over her head while undressing .  Positive FOBT:  She was referred to Dr Vira Agar for FOBT positive test.  Rectal exam was heme negative so he declined to scope her   "It's HELL getting old"  ,  Lives alone,  Still driving.  Independent of all ADLs.  Has a daily call from her 79 yr old companion every morning at  9 am.  Has social outlets  once a week   Wants to participate in exercise but not interested in  PT  Needs TSH checked.    Past Medical History  Diagnosis Date  . Hyperlipidemia   . Thyroid disease     had thyroid removed  . Other chronic cystitis     Radiation , botox injections  . History of lymphoma   . Self-catheterizes urinary bladder     twice daily  . Coronary artery disease   . Coarse tremors    head shakes if does not take propranolol  . Insomnia     takes remeron nightly  . Cancer     lymphomia  . Anemia     hx of blood transfusion    Past Surgical History  Procedure Laterality Date  . Cardiac catheterization  2005    2 cardiac stents  . Abdominal hysterectomy  1970s  . Eye surgery      bilateral cataracts  . Joint replacement  2012    right hip  . Stomach surgery      removal all but small portion of stomach out due to ulcers  . Appendectomy    . Back surgery      lower back - bulging disk  . Spine surgery  2008    [place]  . Lumbar laminectomy/decompression microdiscectomy  01/24/2012    Procedure: LUMBAR LAMINECTOMY/DECOMPRESSION MICRODISCECTOMY 2 LEVELS;  Surgeon: Floyce Stakes, MD;  Location: [place] [facility];  Service: Neurosurgery;  Laterality: Right;  Right Lumbar four five, lumbar five sacral one foraminotomy   . Cystoscopy  April 2013       The following portions of the patient's history were reviewed and updated as appropriate: Allergies, current medications, and problem list.  Review of Systems:   Patient denies headache, fevers, malaise, unintentional weight loss, skin rash, eye pain, sinus congestion and sinus pain, sore throat, dysphagia,  hemoptysis , cough, dyspnea, wheezing, chest pain, palpitations, orthopnea, edema, abdominal pain, nausea, melena, diarrhea, constipation, flank pain, dysuria, hematuria, urinary  Frequency, nocturia, numbness, tingling, seizures,  Focal weakness, Loss of consciousness,  Tremor, insomnia, depression, anxiety, and suicidal ideation.     History   Social History  . Marital Status: Widowed    Spouse Name: N/A    Number of Children: N/A  . Years of Education: N/A   Occupational History  . Not on file.   Social History Main Topics  . Smoking status: Former Smoker    Quit date: 04/04/1971  . Smokeless tobacco: Never Used  . Alcohol Use: No  . Drug Use: No  . Sexual Activity: Not on file   Other Topics  Concern  . Not on file   Social History Narrative  . No narrative on file    Objective:  Filed Vitals:   11/21/13 1103  BP: 124/62  Pulse: 64  Temp: 98.2 F (36.8 C)  Resp: 14     General appearance: alert, cooperative and appears stated age Ears: normal TM's and external ear canals both ears Throat: lips, mucosa, and tongue normal; teeth and gums normal Neck: no adenopathy, no carotid bruit, supple, symmetrical, trachea midline and thyroid not enlarged, symmetric, no tenderness/mass/nodules Back: symmetric, no curvature. ROM normal. No CVA tenderness. Lungs: clear to auscultation bilaterally Heart: regular rate and rhythm, S1, S2 normal, no murmur, click, rub or gallop Abdomen: soft, non-tender; bowel sounds normal; no masses,  no organomegaly Pulses: 2+ and symmetric Skin: Skin color, texture, turgor normal. No rashes or lesions Lymph nodes: Cervical, supraclavicular, and axillary nodes normal. MSK: hip with normal ROM, no bruising,  Gait normal with walker.   Assessment and Plan:  Chronic insomnia Increasing temazepam from 15 mg to 22.5 mg at bedtime   Hypertension Well controlled on current regimen.  no changes today.  Unspecified hypothyroidism Thyroid function is WNL on current dose.   No current changes needed.   Lab Results  Component Value Date   TSH 5.27 11/21/2013     History of recent fall No signs of injury.  Recommended PT butr she has declined.  Referring to [facility] [place] [facility]   Updated Medication List Outpatient Encounter Prescriptions as of 11/21/2013  Medication Sig  . aspirin EC 81 MG tablet Take 81 mg by mouth daily.  Marland Kitchen conjugated estrogens (PREMARIN) vaginal cream Place 1 applicator in vagina every night for two weeks,  Then two times per week thereafter  . levothyroxine (SYNTHROID, LEVOTHROID) 100 MCG tablet TAKE ONE TABLET BY MOUTH EVERY DAY  . omeprazole (PRILOSEC) 40 MG capsule Take 1 capsule (40 mg total) by mouth daily.   Marland Kitchen oxyCODONE-acetaminophen (PERCOCET/ROXICET) 5-325 MG per tablet   . propranolol (INDERAL) 20 MG tablet Take 1 tablet (20 mg total) by mouth 2 (two) times daily.  . [DISCONTINUED] propranolol (INDERAL) 20 MG tablet Take 0.5 tablets (10 mg total) by mouth 2 (two) times daily.  Marland Kitchen oxybutynin (DITROPAN) 5 MG tablet Take 1 tablet (5 mg total) by mouth 3 (three) times daily. For bladder spasm  . temazepam (RESTORIL) 22.5 MG capsule Take 1 capsule (22.5 mg total) by mouth at bedtime as needed for sleep.  . [DISCONTINUED] cephALEXin (KEFLEX) 500 MG capsule Take 1 capsule (500 mg total) by mouth 3 (three) times daily.  . [  DISCONTINUED] Cyanocobalamin 1000 MCG SUBL Place 1 tablet (1,000 mcg total) under the tongue daily.  . [DISCONTINUED] HYDROcodone-acetaminophen (NORCO/VICODIN) 5-325 MG per tablet Take 1 tablet by mouth every 6 (six) hours as needed for pain.  . [DISCONTINUED] mirtazapine (REMERON) 15 MG tablet TAKE ONE TABLET BY MOUTH AT BEDTIME  . [DISCONTINUED] phenazopyridine (PYRIDIUM) 200 MG tablet Take 1 tablet (200 mg total) by mouth 3 (three) times daily as needed for pain.  . [DISCONTINUED] temazepam (RESTORIL) 15 MG capsule Take 1 capsule (15 mg total) by mouth at bedtime as needed for sleep.     Orders Placed This Encounter  Procedures  . COMPLETE METABOLIC PANEL WITH GFR  . CBC with Differential  . Vit D  25 hydroxy (rtn osteoporosis monitoring)  . TSH    No Follow-up on file.

## 2013-11-21 NOTE — Patient Instructions (Addendum)
You are doing well  Please wear your Life Alert around you neck and NEVER take it off  I am increasing your temazepam dose to 22.5 mg since you are having trouble sleeping  Continue 40 mg of omeprazole daily for your stomach  Continue 20 mg twice daily of propranolol (your blood pressure/heart medication)    The hospital has a Silver Sneakers exercise program for all senior citizens .  It costs $5/ class or cheaper if  you buy in bulk  I will rewill your thyroid medication once I see your tsh from today

## 2013-11-21 NOTE — Progress Notes (Signed)
Pre-visit discussion using our clinic review tool. No additional management support is needed unless otherwise documented below in the visit note.  

## 2013-11-22 LAB — VITAMIN D 25 HYDROXY (VIT D DEFICIENCY, FRACTURES): Vit D, 25-Hydroxy: 21 ng/mL — ABNORMAL LOW (ref 30–89)

## 2013-11-23 ENCOUNTER — Encounter: Payer: Self-pay | Admitting: Internal Medicine

## 2013-11-23 DIAGNOSIS — F5104 Psychophysiologic insomnia: Secondary | ICD-10-CM | POA: Insufficient documentation

## 2013-11-23 DIAGNOSIS — Z9181 History of falling: Secondary | ICD-10-CM | POA: Insufficient documentation

## 2013-11-23 NOTE — Assessment & Plan Note (Signed)
Thyroid function is WNL on current dose.   No current changes needed.   Lab Results  Component Value Date   TSH 5.27 11/21/2013

## 2013-11-23 NOTE — Assessment & Plan Note (Signed)
No signs of injury.  Recommended PT butr she has declined.  Referring to [facility] for [place]

## 2013-11-23 NOTE — Assessment & Plan Note (Addendum)
Well controlled on current regimen.  no changes today.   

## 2013-11-23 NOTE — Assessment & Plan Note (Signed)
Increasing temazepam from 15 mg to 22.5 mg at bedtime

## 2013-11-25 DIAGNOSIS — E559 Vitamin D deficiency, unspecified: Secondary | ICD-10-CM | POA: Insufficient documentation

## 2013-11-25 MED ORDER — ERGOCALCIFEROL 1.25 MG (50000 UT) PO CAPS: 50000.0000 [IU] | ORAL_CAPSULE | ORAL | Status: DC

## 2013-11-25 NOTE — Addendum Note (Signed)
Addended by: Crecencio Mc on: 11/25/2013 12:51 PM   Modules accepted: Orders

## 2013-11-26 ENCOUNTER — Encounter: Payer: Self-pay | Admitting: *Deleted

## 2013-12-04 ENCOUNTER — Observation Stay: Payer: Self-pay | Admitting: Internal Medicine

## 2013-12-04 LAB — URINALYSIS, COMPLETE
Bilirubin,UR: NEGATIVE
Glucose,UR: NEGATIVE mg/dL (ref 0–75)
Ketone: NEGATIVE
Nitrite: POSITIVE
Ph: 6 (ref 4.5–8.0)
Protein: NEGATIVE
SPECIFIC GRAVITY: 1.009 (ref 1.003–1.030)
Squamous Epithelial: <1
WBC UR: 211 /HPF (ref 0–5)

## 2013-12-04 LAB — CBC WITH DIFFERENTIAL/PLATELET
BASOS ABS: 0.1 10*3/uL (ref 0.0–0.1)
Basophil %: 0.6 %
EOS PCT: 1.3 %
Eosinophil #: 0.1 10*3/uL (ref 0.0–0.7)
HCT: 34 % — ABNORMAL LOW (ref 35.0–47.0)
HGB: 11.2 g/dL — ABNORMAL LOW (ref 12.0–16.0)
Lymphocyte #: 1.8 10*3/uL (ref 1.0–3.6)
Lymphocyte %: 20.5 %
MCH: 28.9 pg (ref 26.0–34.0)
MCHC: 33 g/dL (ref 32.0–36.0)
MCV: 88 fL (ref 80–100)
MONOS PCT: 8.8 %
Monocyte #: 0.8 x10 3/mm (ref 0.2–0.9)
Neutrophil #: 5.9 10*3/uL (ref 1.4–6.5)
Neutrophil %: 68.8 %
Platelet: 288 10*3/uL (ref 150–440)
RBC: 3.88 10*6/uL (ref 3.80–5.20)
RDW: 16.3 % — AB (ref 11.5–14.5)
WBC: 8.6 10*3/uL (ref 3.6–11.0)

## 2013-12-04 LAB — COMPREHENSIVE METABOLIC PANEL
ALT: 13 U/L (ref 12–78)
Albumin: 3.2 g/dL — ABNORMAL LOW (ref 3.4–5.0)
Alkaline Phosphatase: 65 U/L
Anion Gap: 5 — ABNORMAL LOW (ref 7–16)
BUN: 8 mg/dL (ref 7–18)
Bilirubin,Total: 0.3 mg/dL (ref 0.2–1.0)
CALCIUM: 8.9 mg/dL (ref 8.5–10.1)
CHLORIDE: 100 mmol/L (ref 98–107)
CO2: 28 mmol/L (ref 21–32)
Creatinine: 1.01 mg/dL (ref 0.60–1.30)
EGFR (African American): 56 — ABNORMAL LOW
EGFR (Non-African Amer.): 49 — ABNORMAL LOW
GLUCOSE: 122 mg/dL — AB (ref 65–99)
Osmolality: 266 (ref 275–301)
Potassium: 4.3 mmol/L (ref 3.5–5.1)
SGOT(AST): 30 U/L (ref 15–37)
SODIUM: 133 mmol/L — AB (ref 136–145)
Total Protein: 6.8 g/dL (ref 6.4–8.2)

## 2013-12-04 LAB — TROPONIN I
Troponin-I: <0.02 ng/mL
Troponin-I: <0.02 ng/mL

## 2013-12-04 LAB — LIPASE, BLOOD: Lipase: 97 U/L (ref 73–393)

## 2013-12-05 LAB — COMPREHENSIVE METABOLIC PANEL
ANION GAP: 3 — AB (ref 7–16)
Albumin: 2.7 g/dL — ABNORMAL LOW (ref 3.4–5.0)
Alkaline Phosphatase: 55 U/L
BUN: 5 mg/dL — ABNORMAL LOW (ref 7–18)
Bilirubin,Total: 0.3 mg/dL (ref 0.2–1.0)
CHLORIDE: 108 mmol/L — AB (ref 98–107)
Calcium, Total: 8.3 mg/dL — ABNORMAL LOW (ref 8.5–10.1)
Co2: 30 mmol/L (ref 21–32)
Creatinine: 1.05 mg/dL (ref 0.60–1.30)
EGFR (African American): 54 — ABNORMAL LOW
EGFR (Non-African Amer.): 46 — ABNORMAL LOW
GLUCOSE: 84 mg/dL (ref 65–99)
OSMOLALITY: 278 (ref 275–301)
POTASSIUM: 3.4 mmol/L — AB (ref 3.5–5.1)
SGOT(AST): 18 U/L (ref 15–37)
SGPT (ALT): 12 U/L (ref 12–78)
Sodium: 141 mmol/L (ref 136–145)
Total Protein: 5.9 g/dL — ABNORMAL LOW (ref 6.4–8.2)

## 2013-12-05 LAB — CBC WITH DIFFERENTIAL/PLATELET
BASOS PCT: 0.7 %
Basophil #: 0.1 10*3/uL (ref 0.0–0.1)
Eosinophil #: 0.2 10*3/uL (ref 0.0–0.7)
Eosinophil %: 2.9 %
HCT: 31.2 % — ABNORMAL LOW (ref 35.0–47.0)
HGB: 10.4 g/dL — ABNORMAL LOW (ref 12.0–16.0)
LYMPHS ABS: 2.6 10*3/uL (ref 1.0–3.6)
Lymphocyte %: 33.9 %
MCH: 29.4 pg (ref 26.0–34.0)
MCHC: 33.4 g/dL (ref 32.0–36.0)
MCV: 88 fL (ref 80–100)
Monocyte #: 1.2 x10 3/mm — ABNORMAL HIGH (ref 0.2–0.9)
Monocyte %: 15.8 %
Neutrophil #: 3.5 10*3/uL (ref 1.4–6.5)
Neutrophil %: 46.7 %
PLATELETS: 272 10*3/uL (ref 150–440)
RBC: 3.54 10*6/uL — AB (ref 3.80–5.20)
RDW: 16.1 % — ABNORMAL HIGH (ref 11.5–14.5)
WBC: 7.5 10*3/uL (ref 3.6–11.0)

## 2013-12-06 LAB — URINE CULTURE

## 2013-12-08 ENCOUNTER — Telehealth: Payer: Self-pay | Admitting: Internal Medicine

## 2013-12-08 NOTE — Telephone Encounter (Signed)
Patient Information:  Caller Name: [name]  Phone: [phone]  Patient: Evelyn [name], Evelyn [name]  Gender: Female  DOB: Dec 26, 1919  Age: 80 Years  PCP: Deborra Medina (Adults only)  Office Follow Up:  Does the office need to follow up with this patient?: No  Instructions For The Office: N/A  RN Note:  Pt was discharged from [facility] on 12/04/13 where she was dx'd with a UTI. She can use  a walker to get to the bathroom but this wears her out. Son advises she won't come in today. Would like an appt in AM.  Symptoms  Reason For Call & Symptoms: Weakness  Reviewed Health History In EMR: Yes  Reviewed Medications In EMR: Yes  Reviewed Allergies In EMR: Yes  Reviewed Surgeries / Procedures: Yes  Date of Onset of Symptoms: 12/05/2013  Guideline(s) Used:  Weakness (Generalized) and Fatigue  Disposition Per Guideline:   Go to Office Now  Reason For Disposition Reached:   Moderate weakness (i.e., interferes with work, school, normal activities) and cause unknown  Advice Given:  Call Back If:  Unable to stand or walk  Passes out  Breathing difficulty occurs  You become worse.  RN Overrode Recommendation:  Make Appointment  Would like an appt on 12/09/13  Appointment Scheduled:  12/09/2013 10:30:00 Appointment Scheduled Provider:  Ronette Deter (Adults only)

## 2013-12-09 ENCOUNTER — Ambulatory Visit (INDEPENDENT_AMBULATORY_CARE_PROVIDER_SITE_OTHER): Payer: Medicare Other | Admitting: Internal Medicine

## 2013-12-09 ENCOUNTER — Telehealth: Payer: Self-pay | Admitting: *Deleted

## 2013-12-09 ENCOUNTER — Encounter: Payer: Self-pay | Admitting: Internal Medicine

## 2013-12-09 VITALS — BP 104/64 | HR 67 | Temp 98.3°F | Wt 126.2 lb

## 2013-12-09 DIAGNOSIS — Z1619 Resistance to other specified beta lactam antibiotics: Secondary | ICD-10-CM

## 2013-12-09 DIAGNOSIS — M6281 Muscle weakness (generalized): Secondary | ICD-10-CM

## 2013-12-09 DIAGNOSIS — B9689 Other specified bacterial agents as the cause of diseases classified elsewhere: Secondary | ICD-10-CM

## 2013-12-09 DIAGNOSIS — Z1612 Extended spectrum beta lactamase (ESBL) resistance: Principal | ICD-10-CM

## 2013-12-09 DIAGNOSIS — A499 Bacterial infection, unspecified: Secondary | ICD-10-CM | POA: Insufficient documentation

## 2013-12-09 DIAGNOSIS — N390 Urinary tract infection, site not specified: Secondary | ICD-10-CM

## 2013-12-09 LAB — CULTURE, BLOOD (SINGLE)

## 2013-12-09 LAB — CBC WITH DIFFERENTIAL/PLATELET
Basophils Absolute: 0.1 10*3/uL (ref 0.0–0.1)
Basophils Relative: 0.5 % (ref 0.0–3.0)
Eosinophils Absolute: 0.3 10*3/uL (ref 0.0–0.7)
Eosinophils Relative: 3.2 % (ref 0.0–5.0)
HCT: 33.1 % — ABNORMAL LOW (ref 36.0–46.0)
Hemoglobin: 11 g/dL — ABNORMAL LOW (ref 12.0–15.0)
Lymphocytes Relative: 20.8 % (ref 12.0–46.0)
Lymphs Abs: 2.2 10*3/uL (ref 0.7–4.0)
MCHC: 33.3 g/dL (ref 30.0–36.0)
MCV: 87.4 fl (ref 78.0–100.0)
Monocytes Absolute: 1.1 10*3/uL — ABNORMAL HIGH (ref 0.1–1.0)
Monocytes Relative: 10.7 % (ref 3.0–12.0)
Neutro Abs: 6.9 10*3/uL (ref 1.4–7.7)
Neutrophils Relative %: 64.8 % (ref 43.0–77.0)
Platelets: 362 10*3/uL (ref 150.0–400.0)
RBC: 3.79 Mil/uL — ABNORMAL LOW (ref 3.87–5.11)
RDW: 16.4 % — ABNORMAL HIGH (ref 11.5–15.5)
WBC: 10.7 10*3/uL — ABNORMAL HIGH (ref 4.0–10.5)

## 2013-12-09 LAB — COMPREHENSIVE METABOLIC PANEL
ALT: 13 U/L (ref 0–35)
AST: 19 U/L (ref 0–37)
Albumin: 3.4 g/dL — ABNORMAL LOW (ref 3.5–5.2)
Alkaline Phosphatase: 54 U/L (ref 39–117)
BUN: 11 mg/dL (ref 6–23)
CO2: 27 mEq/L (ref 19–32)
Calcium: 9.2 mg/dL (ref 8.4–10.5)
Chloride: 101 mEq/L (ref 96–112)
Creatinine, Ser: 1 mg/dL (ref 0.4–1.2)
GFR: 57.16 mL/min — ABNORMAL LOW (ref >60.00)
Glucose, Bld: 99 mg/dL (ref 70–99)
Potassium: 4 mEq/L (ref 3.5–5.1)
Sodium: 137 mEq/L (ref 135–145)
Total Bilirubin: 0.4 mg/dL (ref 0.2–1.2)
Total Protein: 6.7 g/dL (ref 6.0–8.3)

## 2013-12-09 LAB — POCT URINALYSIS DIPSTICK
Bilirubin, UA: NEGATIVE
Glucose, UA: NEGATIVE
Ketones, UA: NEGATIVE
Leukocytes, UA: NEGATIVE
Nitrite, UA: NEGATIVE
Protein, UA: NEGATIVE
Spec Grav, UA: 1.01
Urobilinogen, UA: 0.2
pH, UA: 6

## 2013-12-09 NOTE — Assessment & Plan Note (Signed)
Generalized weakness likely multifactorial, related to her age and recent recurrent UTI. Concerned about her risk of fractures with recurrent falls. She may ultimately need placement in ALF, but is opposed to this at present. Reviewed recent labs and notes from her PCP, Dr. Derrel Nip. Will recheck CBC and CMP today.

## 2013-12-09 NOTE — Assessment & Plan Note (Signed)
Reviewed both previous urine culture results and recent results from 5/7-12/05/2013 hospitalization at [facility].  Urine culture this hospitalization showed ESBL producing E.Coli which was pan-resistant (except to Cefoxitin, ertapenem and imipenem). Urinalysis today, however shows only trace blood, no other signs of inflammation.  Symptomatically she appears to be improving and may have had some response to Amoxicillin (which was started during her hospital stay).Will check CBC, CMP with labs today. Will try to set up earlier follow up with Dr. Ola Spurr in [place], as she may ultimately require IV ertapenam.  Follow up in 2 days for recheck.  Over 25min of which >50% spent in face-to-face contact with patient discussing plan of care

## 2013-12-09 NOTE — Progress Notes (Signed)
Pre visit review using our clinic review tool, if applicable. No additional management support is needed unless otherwise documented below in the visit note. 

## 2013-12-09 NOTE — Progress Notes (Signed)
Subjective:    Patient ID: Evelyn [name], female    DOB: [birth year], 80 y.o.   MRN: [id number]  HPI 80YO female presents for follow up after recent hospitalization for UTI.  Seen by Dr. Jacqlyn Larsen last Tuesday. Given hydrocodone for pain/bladder spasm. After taking the medication at home had vomiting, weakness, chest tightness. So weak she could not walk. Went to [facility] ED. Had extensive evaluation with labs, CT of head and abdomen. Work up was normal except for UTI with E.Coli ESBL. This was thought to be sensitive to amoxicillin and pt was discharged Friday with plan for home PT. Has follow up scheduled 5/20 with Dr. Ola Spurr in [place].  At home has been feeling tired, lightheaded, weak. Son has been concerned about her wellbeing. However, reports today that she appears to be "200%" better than yesterday. No fever, abdominal pain, flank pain. No dysuria noted. No urinary urgency or frequency. Has not been taking any medication for bladder spasms. Appetite is good, ate full breakfast with fruit and oatmeal this morning. Drinking Gatorade provided by her son.   Her son also notes several recent falls at home. 1 week prior to seeing Dr. Jacqlyn Larsen, lost balance. Fell backwards from standing position. Bruised arms. Did not seek treatment. Lives alone. Physical therapy was set up in home after hospital discharge, however pt not yet contacted.  Review of Systems  Constitutional: Positive for fatigue. Negative for fever, chills, appetite change and unexpected weight change.  HENT: Negative for congestion, ear pain, sinus pressure, sore throat, trouble swallowing and voice change.   Eyes: Negative for visual disturbance.  Respiratory: Negative for cough, shortness of breath, wheezing and stridor.   Cardiovascular: Negative for chest pain, palpitations and leg swelling.  Gastrointestinal: Negative for nausea, vomiting, abdominal pain, diarrhea, constipation, blood in stool, abdominal distention and anal bleeding.   Genitourinary: Negative for dysuria, urgency, frequency, flank pain, decreased urine volume and pelvic pain.  Musculoskeletal: Positive for gait problem. Negative for arthralgias, myalgias and neck pain.  Skin: Negative for color change and rash.  Neurological: Positive for weakness and light-headedness. Negative for dizziness and headaches.  Hematological: Negative for adenopathy. Does not bruise/bleed easily.  Psychiatric/Behavioral: Negative for suicidal ideas, sleep disturbance and dysphoric mood. The patient is not nervous/anxious.        Objective:    BP 104/64  Pulse 67  Temp(Src) 98.3 F (36.8 C) (Oral)  Wt 126 lb 4 oz (57.267 kg)  SpO2 98% Physical Exam  Constitutional: She is oriented to person, place, and time. She appears well-developed and well-nourished. No distress.  HENT:  Head: Normocephalic and atraumatic.  Right Ear: External ear normal. Decreased hearing is noted.  Left Ear: External ear normal. Decreased hearing is noted.  Nose: Nose normal.  Mouth/Throat: Oropharynx is clear and moist. No oropharyngeal exudate.  Eyes: Conjunctivae are normal. Pupils are equal, round, and reactive to light. Right eye exhibits no discharge. Left eye exhibits no discharge. No scleral icterus.  Neck: Normal range of motion. Neck supple. No tracheal deviation present. No thyromegaly present.  Cardiovascular: Normal rate, regular rhythm and intact distal pulses.  Exam reveals no gallop and no friction rub.   Murmur heard. Pulmonary/Chest: Effort normal and breath sounds normal. No accessory muscle usage. Not tachypneic. No respiratory distress. She has no decreased breath sounds. She has no wheezes. She has no rhonchi. She has no rales. She exhibits no tenderness.  Abdominal: Soft. Bowel sounds are normal. She exhibits no distension. There is no tenderness (  no abdominal tenderness or flank pain).  Musculoskeletal: Normal range of motion. She exhibits no edema and no tenderness.    Lymphadenopathy:    She has no cervical adenopathy.  Neurological: She is alert and oriented to person, place, and time. No cranial nerve deficit. She exhibits normal muscle tone. Coordination normal.  Skin: Skin is warm and dry. No rash noted. She is not diaphoretic. No erythema. No pallor.  Psychiatric: She has a normal mood and affect. Her behavior is normal. Judgment and thought content normal.          Assessment & Plan:   Problem List Items Addressed This Visit   ESBL (extended spectrum beta-lactamase) producing bacteria infection - Primary     Reviewed both previous urine culture results and recent results from 5/7-12/05/2013 hospitalization at [facility].  Urine culture this hospitalization showed ESBL producing E.Coli which was pan-resistant (except to Cefoxitin, ertapenem and imipenem). Urinalysis today, however shows only trace blood, no other signs of inflammation.  Symptomatically she appears to be improving and may have had some response to Amoxicillin (which was started during her hospital stay).Will check CBC, CMP with labs today. Will try to set up earlier follow up with Dr. Ola Spurr in [place], as she may ultimately require IV ertapenam.  Follow up in 2 days for recheck.  Over 42min of which >50% spent in face-to-face contact with patient discussing plan of care     Generalized muscle weakness     Generalized weakness likely multifactorial, related to her age and recent recurrent UTI. Concerned about her risk of fractures with recurrent falls. She may ultimately need placement in ALF, but is opposed to this at present. Reviewed recent labs and notes from her PCP, Dr. Derrel Nip. Will recheck CBC and CMP today.     Other Visit Diagnoses   Infection of urinary tract        Relevant Orders       CBC with Differential       Comprehensive metabolic panel       POCT urinalysis dipstick (Completed)       CULTURE, URINE COMPREHENSIVE       Ambulatory referral to Infectious Disease         Return in about 2 days (around 12/11/2013) for Recheck.

## 2013-12-09 NOTE — Telephone Encounter (Signed)
Evelyn [name] with [facility] left VM, request for home health was received from hospital but never received specific orders. OK for them to proceed with OT and PT?

## 2013-12-09 NOTE — Telephone Encounter (Signed)
Yes,  Please start OT and PT>< ASAP  I have not seen patient yet and the patient has been home for 4 days!! Theu should have received orders from the hospitalist who discharged her.  Please have them call the care management team at [facility] and find out why they did not reciev orders

## 2013-12-10 NOTE — Telephone Encounter (Signed)
Cindy at [facility] notified as requested. Patient has an appointment with Dr. Derrel Nip 12/11/13.

## 2013-12-11 ENCOUNTER — Encounter: Payer: Self-pay | Admitting: *Deleted

## 2013-12-11 ENCOUNTER — Ambulatory Visit (INDEPENDENT_AMBULATORY_CARE_PROVIDER_SITE_OTHER): Payer: Medicare Other | Admitting: Internal Medicine

## 2013-12-11 ENCOUNTER — Encounter: Payer: Self-pay | Admitting: Internal Medicine

## 2013-12-11 VITALS — BP 120/64 | HR 73 | Temp 98.0°F | Resp 16 | Wt 127.2 lb

## 2013-12-11 DIAGNOSIS — Z1612 Extended spectrum beta lactamase (ESBL) resistance: Secondary | ICD-10-CM

## 2013-12-11 DIAGNOSIS — D509 Iron deficiency anemia, unspecified: Secondary | ICD-10-CM

## 2013-12-11 DIAGNOSIS — M6281 Muscle weakness (generalized): Secondary | ICD-10-CM

## 2013-12-11 DIAGNOSIS — N390 Urinary tract infection, site not specified: Secondary | ICD-10-CM

## 2013-12-11 DIAGNOSIS — M48061 Spinal stenosis, lumbar region without neurogenic claudication: Secondary | ICD-10-CM

## 2013-12-11 DIAGNOSIS — B9689 Other specified bacterial agents as the cause of diseases classified elsewhere: Secondary | ICD-10-CM

## 2013-12-11 DIAGNOSIS — A499 Bacterial infection, unspecified: Secondary | ICD-10-CM

## 2013-12-11 DIAGNOSIS — K6289 Other specified diseases of anus and rectum: Secondary | ICD-10-CM

## 2013-12-11 DIAGNOSIS — Z1619 Resistance to other specified beta lactam antibiotics: Secondary | ICD-10-CM

## 2013-12-11 LAB — CULTURE, URINE COMPREHENSIVE
COLONY COUNT: NO GROWTH
ORGANISM ID, BACTERIA: NO GROWTH

## 2013-12-11 NOTE — Progress Notes (Signed)
Pre-visit discussion using our clinic review tool. No additional management support is needed unless otherwise documented below in the visit note.  

## 2013-12-11 NOTE — Patient Instructions (Addendum)
I'm sorry you are not feeling well today.   Your blood work from 5/12 was normal /stable.  You should keep  Drinking the gatorade to stay hydrated.  [facility] has been contacted and given orders for your home PT and nursing.  This is temporary,  however, and they will eventually stop.  Please keep the appt with Dr fitzgerald next week to talk about your urinary tract infections.  I recommend taking the probiotic twice daily for life. nd also recommend taking  The azo cranberry tablets at least once daily   Your bottom is very irritated and needs to have a barrier cream ,.  i will have [facility] Go apply this when they come  You need to go to the [facility] ([facility] to pick up a stool container to take home and collect your liquid stool and return to the hospital lab to test for C dificile colitis  Please consider transitioning to Assisted Living to improve your quality of life.         Phenazopyridine tablets What is this medicine? PHENAZOPYRIDINE (fen az oh PEER i deen) is a pain reliever. It is used to stop the pain, burning, or discomfort caused by infection or irritation of the urinary tract. This medicine is not an antibiotic. It will not cure a urinary tract infection. This medicine may be used for other purposes; ask your health care provider or pharmacist if you have questions. COMMON BRAND NAME(S): AZO, Azo-100, Azo-Gesic, Azo-Septic, Azo-Standard, Phenazo, Prodium, Pyridium, Urinary Analgesic , Uristat What should I tell my health care provider before I take this medicine? They need to know if you have any of these conditions: -glucose-6-phosphate dehydrogenase (G6PD) deficiency -kidney disease -an unusual or allergic reaction to phenazopyridine, other medicines, foods, dyes, or preservatives -pregnant or trying to get pregnant -breast-feeding How should I use this medicine? Take this medicine by mouth with a glass of water. Follow the directions on the  prescription label. Take after meals. Take your doses at regular intervals. Do not take your medicine more often than directed. Do not skip doses or stop your medicine early even if you feel better. Do not stop taking except on your doctor's advice. Talk to your pediatrician regarding the use of this medicine in children. Special care may be needed. Overdosage: If you think you have taken too much of this medicine contact a poison control center or emergency room at once. NOTE: This medicine is only for you. Do not share this medicine with others. What if I miss a dose? If you miss a dose, take it as soon as you can. If it is almost time for your next dose, take only that dose. Do not take double or extra doses. What may interact with this medicine? Interactions are not expected. This list may not describe all possible interactions. Give your health care provider a list of all the medicines, herbs, non-prescription drugs, or dietary supplements you use. Also tell them if you smoke, drink alcohol, or use illegal drugs. Some items may interact with your medicine. What should I watch for while using this medicine? Tell your doctor or health care professional if your symptoms do not improve or if they get worse. This medicine colors body fluids red. This effect is harmless and will go away after you are done taking the medicine. It will change urine to an dark orange or red color. The red color may stain clothing. Soft contact lenses may become permanently stained. It is best  not to wear soft contact lenses while taking this medicine. If you are diabetic you may get a false positive result for sugar in your urine. Talk to your health care provider. What side effects may I notice from receiving this medicine? Side effects that you should report to your doctor or health care professional as soon as possible: -allergic reactions like skin rash, itching or hives, swelling of the face, lips, or tongue -blue  or purple color of the skin -difficulty breathing -fever -less urine -unusual bleeding, bruising -unusual tired, weak -vomiting -yellowing of the eyes or skin Side effects that usually do not require medical attention (report to your doctor or health care professional if they continue or are bothersome): -dark urine -headache -stomach upset This list may not describe all possible side effects. Call your doctor for medical advice about side effects. You may report side effects to FDA at 1-800-FDA-1088. Where should I keep my medicine? Keep out of the reach of children. Store at room temperature between 15 and 30 degrees C (59 and 86 degrees F). Protect from light and moisture. Throw away any unused medicine after the expiration date. NOTE: This sheet is a summary. It may not cover all possible information. If you have questions about this medicine, talk to your doctor, pharmacist, or health care provider.  2014, Elsevier/Gold Standard. (2008-02-13 11:04:07)

## 2013-12-14 DIAGNOSIS — K6289 Other specified diseases of anus and rectum: Secondary | ICD-10-CM | POA: Insufficient documentation

## 2013-12-14 NOTE — Assessment & Plan Note (Addendum)
S/p lumbar decompression June 2013 by Leeroy Cha.  Patient continues to report daily pain

## 2013-12-14 NOTE — Assessment & Plan Note (Signed)
She has non thrombosed internal and external hemorrhoids, and he r skin on the natal cleft is irritated due to constant urinary  incontinence.  Derma cloud ordered.

## 2013-12-14 NOTE — Assessment & Plan Note (Signed)
Treated with augmentin by [facility].  repeat UA has been normal

## 2013-12-14 NOTE — Progress Notes (Addendum)
Patient ID: Evelyn [name], female   DOB: [birth year], 81 y.o.   MRN: [id number]  Patient Active Problem List   Diagnosis Date Noted  . ESBL (extended spectrum beta-lactamase) producing bacteria infection 12/09/2013  . Unspecified vitamin D deficiency 11/25/2013  . Chronic insomnia 11/23/2013  . History of recent fall 11/23/2013  . Bradycardia 04/10/2013  . Generalized muscle weakness 04/10/2013  . Anemia, iron deficiency 12/27/2012  . Dysphagia, pharyngoesophageal phase 07/06/2012  . Anemia, pernicious 10/15/2011  . Spinal stenosis of lumbar region 10/15/2011  . Hypertension   . Hyperlipidemia   . Unspecified hypothyroidism   . Other chronic cystitis   . History of lymphoma   . Urinary tract infection 07/14/2011    Subjective:  CC:   Chief Complaint  Patient presents with  . Follow-up    Hospital follow up    HPI:   Evelyn [name] is a 80 y.o. female who presents for Follow up on multiple issues.  She was admitted to [facility] on May 7 with nausea and vomiting, attributed to adverse reaction to vicodin,  But diagnosed with MDR E Coli UTI sensitive to Augmetin  CT abd and pelvis was done woth no acute findings and right ship was  imaged due to concern for recurrent fracture secondary to recent fall. She was kischarged home on May 8 with home health services supposedly ordered through [facility],  And oral augmentin for UTI and was seen by Dr. Gilford Rile several days after discharge at the request of her son. A  repeat UA  Was normal Patient is again accompanied by son.  [name] has nott seen patient yet despite her being home from hospital for 6  days. Appt with Adrian Prows, ID next week .She has good days and bad days,  Due to fatigue and nausea  Long discussion about patient's declining health, frequent UTIs and  need for transition to assisted living situation,  which she has refused to do.   She is complaining of rectal pain. "It hurts to sit down."   Past Medical  History  Diagnosis Date  . Hyperlipidemia   . Thyroid disease     had thyroid removed  . Other chronic cystitis     Radiation , botox injections  . History of lymphoma   . Self-catheterizes urinary bladder     twice daily  . Coronary artery disease   . Coarse tremors     head shakes if does not take propranolol  . Insomnia     takes remeron nightly  . Cancer     lymphomia  . Anemia     hx of blood transfusion    Past Surgical History  Procedure Laterality Date  . Cardiac catheterization  2005    2 cardiac stents  . Abdominal hysterectomy  1970s  . Eye surgery      bilateral cataracts  . Joint replacement  2012    right hip  . Stomach surgery      removal all but small portion of stomach out due to ulcers  . Appendectomy    . Back surgery      lower back - bulging disk  . Spine surgery  2008    [place]  . Lumbar laminectomy/decompression microdiscectomy  01/24/2012    Procedure: LUMBAR LAMINECTOMY/DECOMPRESSION MICRODISCECTOMY 2 LEVELS;  Surgeon: Floyce Stakes, MD;  Location: [place] [facility];  Service: Neurosurgery;  Laterality: Right;  Right Lumbar four five, lumbar five sacral one foraminotomy   . Cystoscopy  April 2013       The following portions of the patient's history were reviewed and updated as appropriate: Allergies, current medications, and problem list.    Review of Systems:   Patient denies headache, fevers, malaise, unintentional weight loss, skin rash, eye pain, sinus congestion and sinus pain, sore throat, dysphagia,  hemoptysis , cough, dyspnea, wheezing, chest pain, palpitations, orthopnea, edema, abdominal pain, nausea, melena, diarrhea, constipation, flank pain, dysuria, hematuria, urinary  Frequency, nocturia, numbness, tingling, seizures,  Focal weakness, Loss of consciousness,  Tremor, insomnia, depression, anxiety, and suicidal ideation.     History   Social History  . Marital Status: Widowed    Spouse Name: N/A    Number of Children:  N/A  . Years of Education: N/A   Occupational History  . Not on file.   Social History Main Topics  . Smoking status: Former Smoker    Quit date: 04/04/1971  . Smokeless tobacco: Never Used  . Alcohol Use: No  . Drug Use: No  . Sexual Activity: Not on file   Other Topics Concern  . Not on file   Social History Narrative  . No narrative on file    Objective:  Filed Vitals:   12/11/13 1008  BP: 120/64  Pulse: 73  Temp: 98 F (36.7 C)  Resp: 16     General appearance: alert, cooperative and appears stated age Ears: normal TM's and external ear canals both ears Throat: lips, mucosa, and tongue normal; teeth and gums normal Neck: no adenopathy, no carotid bruit, supple, symmetrical, trachea midline and thyroid not enlarged, symmetric, no tenderness/mass/nodules Back: symmetric, no curvature. ROM normal. No CVA tenderness. Lungs: clear to auscultation bilaterally Heart: regular rate and rhythm, S1, S2 normal, no murmur, click, rub or gallop Abdomen: soft, non-tender; bowel sounds normal; no masses,  no organomegaly Pulses: 2+ and symmetric Skin: Skin color, texture, turgor normal. No rashes or lesions Lymph nodes: Cervical, supraclavicular, and axillary nodes norma.l Rectal: erythema of skin surrounding natal cleft,  Internal and external hemorrhoids  Assessment and Plan:  Generalized muscle weakness multifactorial ,secondary to spinal stenosis, extreme age, and recent hospitalization ofe dehydration and hyponatremia.  Home PT ordered .  ESBL (extended spectrum beta-lactamase) producing bacteria infection Treated with augmentin by [facility].  repeat UA has been normal  Urinary tract infection Recurrent. Resulting in hospitalization recently.  Has sewn urology and  ID . Recommend trial of azo with cranberry for prevention   Anemia, iron deficiency Stable by repeat labs.   Lab Results  Component Value Date   HGB 11.0* 12/09/2013     Spinal stenosis of lumbar  region S/p lumbar decompression June 2013 by Leeroy Cha.  Patient continues to report daily pain   Rectal pain She has non thrombosed internal and external hemorrhoids, and he r skin on the natal cleft is irritated due to constant urinary  incontinence.  Derma cloud ordered. A total of 40 minutes was spent with patient more than half of which was spent in counseling, reviewing records from other prviders and coordination of care.   Updated Medication List Outpatient Encounter Prescriptions as of 12/11/2013  Medication Sig  . amoxicillin-clavulanate (AUGMENTIN) 875-125 MG per tablet Take 325 tablets by mouth 2 (two) times daily.  Marland Kitchen aspirin EC 81 MG tablet Take 81 mg by mouth daily.  . ergocalciferol (DRISDOL) 50000 UNITS capsule Take 1 capsule (50,000 Units total) by mouth once a week.  . levothyroxine (SYNTHROID, LEVOTHROID) 100 MCG tablet TAKE ONE TABLET BY  MOUTH EVERY DAY  . omeprazole (PRILOSEC) 40 MG capsule Take 1 capsule (40 mg total) by mouth daily.  Marland Kitchen oxybutynin (DITROPAN) 5 MG tablet Take 1 tablet (5 mg total) by mouth 3 (three) times daily. For bladder spasm  . oxyCODONE-acetaminophen (PERCOCET/ROXICET) 5-325 MG per tablet   . propranolol (INDERAL) 20 MG tablet Take 1 tablet (20 mg total) by mouth 2 (two) times daily.  Marland Kitchen conjugated estrogens (PREMARIN) vaginal cream Place 1 applicator in vagina every night for two weeks,  Then two times per week thereafter  . temazepam (RESTORIL) 22.5 MG capsule Take 1 capsule (22.5 mg total) by mouth at bedtime as needed for sleep.

## 2013-12-14 NOTE — Assessment & Plan Note (Signed)
multifactorial ,secondary to spinal stenosis, extreme age, and recent hospitalization ofe dehydration and hyponatremia.  Home PT ordered .

## 2013-12-14 NOTE — Assessment & Plan Note (Signed)
Stable by repeat labs.   Lab Results  Component Value Date   HGB 11.0* 12/09/2013

## 2013-12-14 NOTE — Assessment & Plan Note (Signed)
Recurrent. Resulting in hospitalization recently.  Has sewn urology and  ID . Recommend trial of azo with cranberry for prevention

## 2013-12-15 ENCOUNTER — Other Ambulatory Visit: Payer: Self-pay | Admitting: Internal Medicine

## 2013-12-15 LAB — CLOSTRIDIUM DIFFICILE(ARMC)

## 2013-12-18 ENCOUNTER — Telehealth: Payer: Self-pay | Admitting: Internal Medicine

## 2013-12-18 DIAGNOSIS — R197 Diarrhea, unspecified: Secondary | ICD-10-CM | POA: Insufficient documentation

## 2013-12-18 NOTE — Telephone Encounter (Signed)
c dificile testing was negative by  Dec 15 2013 [facility].  She can use immodium if stools are still loose.  Follow the  Instructions on the box

## 2013-12-18 NOTE — Telephone Encounter (Signed)
Patient notified and voiced understanding.

## 2013-12-25 ENCOUNTER — Telehealth: Payer: Self-pay | Admitting: Internal Medicine

## 2013-12-25 NOTE — Telephone Encounter (Signed)
Pt states med was called to her pharmacy (did not know name).  States it is 500.00 and she cannot afford that.  States pharmacy is waiting on a call from Dr. Derrel Nip to straighten this out.  States she needs to know what will be done before she goes over and would like a call.

## 2013-12-25 NOTE — Telephone Encounter (Signed)
Patient was confused [facility] dr. Ola Spurr called in a medication

## 2013-12-26 ENCOUNTER — Inpatient Hospital Stay: Payer: Self-pay | Admitting: Internal Medicine

## 2013-12-26 LAB — CBC WITH DIFFERENTIAL/PLATELET
BASOS ABS: 0.1 10*3/uL (ref 0.0–0.1)
Basophil %: 1.1 %
EOS ABS: 0.4 10*3/uL (ref 0.0–0.7)
Eosinophil %: 4.1 %
HCT: 36.3 % (ref 35.0–47.0)
HGB: 11.8 g/dL — ABNORMAL LOW (ref 12.0–16.0)
LYMPHS ABS: 3 10*3/uL (ref 1.0–3.6)
Lymphocyte %: 26.8 %
MCH: 28.3 pg (ref 26.0–34.0)
MCHC: 32.4 g/dL (ref 32.0–36.0)
MCV: 87 fL (ref 80–100)
MONOS PCT: 7.5 %
Monocyte #: 0.8 x10 3/mm (ref 0.2–0.9)
NEUTROS PCT: 60.5 %
Neutrophil #: 6.6 10*3/uL — ABNORMAL HIGH (ref 1.4–6.5)
PLATELETS: 381 10*3/uL (ref 150–440)
RBC: 4.16 10*6/uL (ref 3.80–5.20)
RDW: 16.1 % — AB (ref 11.5–14.5)
WBC: 11 10*3/uL (ref 3.6–11.0)

## 2013-12-26 LAB — URINALYSIS, COMPLETE
Bilirubin,UR: NEGATIVE
GLUCOSE, UR: NEGATIVE mg/dL (ref 0–75)
Ketone: NEGATIVE
Nitrite: POSITIVE
Ph: 6 (ref 4.5–8.0)
Protein: NEGATIVE
SPECIFIC GRAVITY: 1.006 (ref 1.003–1.030)
Squamous Epithelial: 2

## 2013-12-26 LAB — LIPID PANEL
CHOLESTEROL: 282 mg/dL — AB (ref 0–200)
HDL: 42 mg/dL (ref 40–60)
Ldl Cholesterol, Calc: 191 mg/dL — ABNORMAL HIGH (ref 0–100)
Triglycerides: 247 mg/dL — ABNORMAL HIGH (ref 0–200)
VLDL Cholesterol, Calc: 49 mg/dL — ABNORMAL HIGH (ref 5–40)

## 2013-12-26 LAB — PRO B NATRIURETIC PEPTIDE: B-TYPE NATIURETIC PEPTID: 1305 pg/mL — AB (ref 0–450)

## 2013-12-26 LAB — BASIC METABOLIC PANEL
ANION GAP: 3 — AB (ref 7–16)
BUN: 13 mg/dL (ref 7–18)
Calcium, Total: 8.7 mg/dL (ref 8.5–10.1)
Chloride: 106 mmol/L (ref 98–107)
Co2: 31 mmol/L (ref 21–32)
Creatinine: 1 mg/dL (ref 0.60–1.30)
EGFR (African American): 57 — ABNORMAL LOW
EGFR (Non-African Amer.): 49 — ABNORMAL LOW
Glucose: 101 mg/dL — ABNORMAL HIGH (ref 65–99)
Osmolality: 280 (ref 275–301)
Potassium: 3.8 mmol/L (ref 3.5–5.1)
Sodium: 140 mmol/L (ref 136–145)

## 2013-12-26 LAB — PROTIME-INR
INR: 0.9
Prothrombin Time: 12.4 secs (ref 11.5–14.7)

## 2013-12-26 LAB — CK TOTAL AND CKMB (NOT AT ARMC)
CK, TOTAL: 39 U/L
CK, Total: 44 U/L
CK-MB: 0.7 ng/mL (ref 0.5–3.6)
CK-MB: 0.7 ng/mL (ref 0.5–3.6)

## 2013-12-26 LAB — TROPONIN I: Troponin-I: 0.02 ng/mL

## 2013-12-26 LAB — APTT: Activated PTT: 26.2 secs (ref 23.6–35.9)

## 2013-12-27 LAB — BASIC METABOLIC PANEL
Anion Gap: 9 (ref 7–16)
BUN: 12 mg/dL (ref 7–18)
CO2: 26 mmol/L (ref 21–32)
CREATININE: 0.98 mg/dL (ref 0.60–1.30)
Calcium, Total: 8.3 mg/dL — ABNORMAL LOW (ref 8.5–10.1)
Chloride: 108 mmol/L — ABNORMAL HIGH (ref 98–107)
EGFR (African American): 58 — ABNORMAL LOW
EGFR (Non-African Amer.): 50 — ABNORMAL LOW
GLUCOSE: 97 mg/dL (ref 65–99)
Osmolality: 285 (ref 275–301)
Potassium: 3.8 mmol/L (ref 3.5–5.1)
Sodium: 143 mmol/L (ref 136–145)

## 2013-12-27 LAB — CBC WITH DIFFERENTIAL/PLATELET
BASOS ABS: 0.1 10*3/uL (ref 0.0–0.1)
Basophil %: 1 %
Eosinophil #: 0.6 10*3/uL (ref 0.0–0.7)
Eosinophil %: 6.7 %
HCT: 34.1 % — ABNORMAL LOW (ref 35.0–47.0)
HGB: 11.1 g/dL — ABNORMAL LOW (ref 12.0–16.0)
LYMPHS ABS: 3.2 10*3/uL (ref 1.0–3.6)
Lymphocyte %: 35.6 %
MCH: 28.5 pg (ref 26.0–34.0)
MCHC: 32.6 g/dL (ref 32.0–36.0)
MCV: 87 fL (ref 80–100)
MONO ABS: 1.3 x10 3/mm — AB (ref 0.2–0.9)
MONOS PCT: 13.7 %
Neutrophil #: 3.9 10*3/uL (ref 1.4–6.5)
Neutrophil %: 43 %
Platelet: 369 10*3/uL (ref 150–440)
RBC: 3.91 10*6/uL (ref 3.80–5.20)
RDW: 15.9 % — ABNORMAL HIGH (ref 11.5–14.5)
WBC: 9.1 10*3/uL (ref 3.6–11.0)

## 2013-12-27 LAB — TROPONIN I: TROPONIN-I: 0.02 ng/mL

## 2013-12-28 LAB — URINE CULTURE

## 2013-12-29 ENCOUNTER — Ambulatory Visit: Payer: Medicare Other | Admitting: Internal Medicine

## 2013-12-31 ENCOUNTER — Encounter: Payer: Self-pay | Admitting: Internal Medicine

## 2014-01-15 ENCOUNTER — Encounter: Payer: Self-pay | Admitting: Adult Health

## 2014-01-15 ENCOUNTER — Ambulatory Visit (INDEPENDENT_AMBULATORY_CARE_PROVIDER_SITE_OTHER): Payer: Medicare Other | Admitting: Adult Health

## 2014-01-15 VITALS — BP 100/56 | HR 69 | Temp 98.2°F | Resp 14 | Wt 127.0 lb

## 2014-01-15 DIAGNOSIS — Z09 Encounter for follow-up examination after completed treatment for conditions other than malignant neoplasm: Secondary | ICD-10-CM

## 2014-01-15 NOTE — Progress Notes (Signed)
Pre visit review using our clinic review tool, if applicable. No additional management support is needed unless otherwise documented below in the visit note. 

## 2014-01-15 NOTE — Patient Instructions (Signed)
  Please have your blood work done prior to leaving the office.  We will contact you with results once they are available.  Eat 3 meals and try to drink 1 Ensure daily as a snack.  Drink water to stay hydrated.  Make sure your walk around the house to slowly build up your strength but take rest periods in between.  Please schedule a follow up appointment with Dr. Derrel Nip in approximate 1 month.

## 2014-01-15 NOTE — Progress Notes (Signed)
Patient ID: Evelyn [name], female   DOB: 1920-07-15, 80 y.o.   MRN: [id number]   Subjective:    Patient ID: Evelyn [name], female    DOB: March 07, 1920, 80 y.o.   MRN: [id number]  HPI  Pt is a pleasant 80 y/o female who presents to clinic following hospitalization at [facility] from 12/26/13 - 12/27/13 and then admitted to [facility] from 12/27/13 - 01/10/14. She was admitted for complicated UTI requiring IV antibiotics which were continued at [place]. Pt was followed by ID, Dr. Ola Spurr, whom she has already seen in follow up.  She reports that she is feeling weak. She is performing her ADLs and has family assisting with her iADLs. Appetite is not where it was prior to hospitalization. She is concerned about feeling so weak.    Past Medical History  Diagnosis Date  . Hyperlipidemia   . Thyroid disease     had thyroid removed  . Other chronic cystitis     Radiation , botox injections  . History of lymphoma   . Self-catheterizes urinary bladder     twice daily  . Coronary artery disease   . Coarse tremors     head shakes if does not take propranolol  . Insomnia     takes remeron nightly  . Cancer     lymphomia  . Anemia     hx of blood transfusion    Current Outpatient Prescriptions on File Prior to Visit  Medication Sig Dispense Refill  . aspirin EC 81 MG tablet Take 81 mg by mouth daily.      Marland Kitchen conjugated estrogens (PREMARIN) vaginal cream Place 1 applicator in vagina every night for two weeks,  Then two times per week thereafter  42.5 g  12  . ergocalciferol (DRISDOL) 50000 UNITS capsule Take 1 capsule (50,000 Units total) by mouth once a week.  12 capsule  0  . levothyroxine (SYNTHROID, LEVOTHROID) 100 MCG tablet TAKE ONE TABLET BY MOUTH EVERY DAY  90 tablet  2  . omeprazole (PRILOSEC) 40 MG capsule Take 1 capsule (40 mg total) by mouth daily.  30 capsule  3  . propranolol (INDERAL) 20 MG tablet Take 1 tablet (20 mg total) by mouth 2 (two) times daily.  180 tablet  3  .  temazepam (RESTORIL) 22.5 MG capsule Take 1 capsule (22.5 mg total) by mouth at bedtime as needed for sleep.  30 capsule  5  . [DISCONTINUED] esomeprazole (NEXIUM) 40 MG capsule Take 1 capsule (40 mg total) by mouth daily.  30 capsule  6   No current facility-administered medications on file prior to visit.     Review of Systems  Constitutional: Positive for fatigue.  HENT: Negative.   Eyes: Negative.   Respiratory: Negative.  Negative for cough, shortness of breath and wheezing.   Cardiovascular: Negative.  Negative for chest pain and leg swelling.  Gastrointestinal: Negative.   Endocrine: Negative.   Genitourinary: Negative.   Musculoskeletal: Negative.   Skin: Negative.   Allergic/Immunologic: Negative.   Neurological: Positive for weakness.  Hematological: Negative.   Psychiatric/Behavioral: Negative.        Objective:  BP 100/56  Pulse 69  Temp(Src) 98.2 F (36.8 C) (Oral)  Resp 14  Wt 127 lb (57.607 kg)  SpO2 95%   Physical Exam  Constitutional: She is oriented to person, place, and time. She appears well-developed and well-nourished. No distress.  HENT:  Head: Normocephalic and atraumatic.  Eyes: Conjunctivae and EOM are normal.  Neck:  Normal range of motion. Neck supple.  Cardiovascular: Normal rate, regular rhythm, normal heart sounds and intact distal pulses.  Exam reveals no gallop and no friction rub.   No murmur heard. Pulmonary/Chest: Effort normal and breath sounds normal. No respiratory distress. She has no wheezes. She has no rales.  Abdominal: Soft. Bowel sounds are normal.  Musculoskeletal: Normal range of motion.  Neurological: She is alert and oriented to person, place, and time. She has normal reflexes. Coordination normal.  Skin: Skin is warm and dry.  Psychiatric: She has a normal mood and affect. Her behavior is normal. Judgment and thought content normal.       Assessment & Plan:   1. Hospital discharge follow-up Discussed weakness  common after hospitalization requiring skilled nursing facility. Average time it takes for recovery per day of hospital admission is approximately 3-4 days. Discussed taking things slowly and frequent rest periods. Needs to make sure she is consuming enough calories to help her recover. Her son had brought her some ensure but she has not been drinking theses. Suggested she have the ensure as a snack and not meal replacement. Will check labs to make sure she is not anemic or has altered electrolytes. Provided pt with opportunity to ask questions. Follow up with your PCP in 1 month  - CBC with Differential - Basic metabolic panel

## 2014-01-16 LAB — CBC WITH DIFFERENTIAL/PLATELET
BASOS ABS: 0 10*3/uL (ref 0.0–0.1)
BASOS PCT: 0.3 % (ref 0.0–3.0)
Eosinophils Absolute: 1.3 10*3/uL — ABNORMAL HIGH (ref 0.0–0.7)
Eosinophils Relative: 15.2 % — ABNORMAL HIGH (ref 0.0–5.0)
HCT: 30.9 % — ABNORMAL LOW (ref 36.0–46.0)
HEMOGLOBIN: 10.2 g/dL — AB (ref 12.0–15.0)
LYMPHS PCT: 19 % (ref 12.0–46.0)
Lymphs Abs: 1.6 10*3/uL (ref 0.7–4.0)
MCHC: 33.1 g/dL (ref 30.0–36.0)
MCV: 85.8 fl (ref 78.0–100.0)
Monocytes Absolute: 0.6 10*3/uL (ref 0.1–1.0)
Monocytes Relative: 7 % (ref 3.0–12.0)
NEUTROS PCT: 58.5 % (ref 43.0–77.0)
Neutro Abs: 5 10*3/uL (ref 1.4–7.7)
Platelets: 350 10*3/uL (ref 150.0–400.0)
RBC: 3.6 Mil/uL — AB (ref 3.87–5.11)
RDW: 15.3 % (ref 11.5–15.5)
WBC: 8.5 10*3/uL (ref 4.0–10.5)

## 2014-01-16 LAB — BASIC METABOLIC PANEL
BUN: 15 mg/dL (ref 6–23)
CALCIUM: 8.7 mg/dL (ref 8.4–10.5)
CHLORIDE: 101 meq/L (ref 96–112)
CO2: 27 meq/L (ref 19–32)
Creatinine, Ser: 1.1 mg/dL (ref 0.4–1.2)
GFR: 51.58 mL/min — ABNORMAL LOW (ref >60.00)
GLUCOSE: 80 mg/dL (ref 70–99)
Potassium: 4.3 mEq/L (ref 3.5–5.1)
Sodium: 136 mEq/L (ref 135–145)

## 2014-01-19 ENCOUNTER — Other Ambulatory Visit: Payer: Self-pay | Admitting: Adult Health

## 2014-01-19 DIAGNOSIS — D649 Anemia, unspecified: Secondary | ICD-10-CM

## 2014-01-21 ENCOUNTER — Other Ambulatory Visit (INDEPENDENT_AMBULATORY_CARE_PROVIDER_SITE_OTHER): Payer: Medicare Other

## 2014-01-21 DIAGNOSIS — R899 Unspecified abnormal finding in specimens from other organs, systems and tissues: Secondary | ICD-10-CM

## 2014-01-21 DIAGNOSIS — R6889 Other general symptoms and signs: Secondary | ICD-10-CM

## 2014-01-21 DIAGNOSIS — D649 Anemia, unspecified: Secondary | ICD-10-CM

## 2014-01-21 LAB — CBC WITH DIFFERENTIAL/PLATELET
BASOS PCT: 2.5 % (ref 0.0–3.0)
Basophils Absolute: 0.3 10*3/uL — ABNORMAL HIGH (ref 0.0–0.1)
Eosinophils Absolute: 0.6 10*3/uL (ref 0.0–0.7)
Eosinophils Relative: 5.8 % — ABNORMAL HIGH (ref 0.0–5.0)
HCT: 32 % — ABNORMAL LOW (ref 36.0–46.0)
Hemoglobin: 10.4 g/dL — ABNORMAL LOW (ref 12.0–15.0)
LYMPHS ABS: 2.8 10*3/uL (ref 0.7–4.0)
Lymphocytes Relative: 25.9 % (ref 12.0–46.0)
MCHC: 32.6 g/dL (ref 30.0–36.0)
MCV: 85.8 fl (ref 78.0–100.0)
MONO ABS: 1 10*3/uL (ref 0.1–1.0)
Monocytes Relative: 9.4 % (ref 3.0–12.0)
Neutro Abs: 6.2 10*3/uL (ref 1.4–7.7)
Neutrophils Relative %: 56.4 % (ref 43.0–77.0)
PLATELETS: 400 10*3/uL (ref 150.0–400.0)
RBC: 3.73 Mil/uL — ABNORMAL LOW (ref 3.87–5.11)
RDW: 15.8 % — ABNORMAL HIGH (ref 11.5–15.5)
WBC: 11 10*3/uL — ABNORMAL HIGH (ref 4.0–10.5)

## 2014-01-21 LAB — VITAMIN B12: Vitamin B-12: 1232 pg/mL — ABNORMAL HIGH (ref 211–911)

## 2014-01-21 LAB — FOLATE: Folate: 16.6 ng/mL (ref >5.9)

## 2014-01-21 LAB — IRON: Iron: 40 ug/dL — ABNORMAL LOW (ref 42–145)

## 2014-01-21 LAB — FERRITIN: Ferritin: 20.3 ng/mL (ref 10.0–291.0)

## 2014-01-22 ENCOUNTER — Other Ambulatory Visit: Payer: Self-pay | Admitting: Adult Health

## 2014-01-22 DIAGNOSIS — R899 Unspecified abnormal finding in specimens from other organs, systems and tissues: Secondary | ICD-10-CM

## 2014-01-23 ENCOUNTER — Other Ambulatory Visit: Payer: Self-pay | Admitting: *Deleted

## 2014-01-23 MED ORDER — FERROUS SULFATE 325 (65 FE) MG PO TABS: 325.0000 mg | ORAL_TABLET | Freq: Every day | ORAL | Status: DC

## 2014-01-26 ENCOUNTER — Ambulatory Visit: Payer: Medicare Other | Admitting: Adult Health

## 2014-01-26 DIAGNOSIS — Z0289 Encounter for other administrative examinations: Secondary | ICD-10-CM

## 2014-02-18 ENCOUNTER — Ambulatory Visit (INDEPENDENT_AMBULATORY_CARE_PROVIDER_SITE_OTHER): Payer: Medicare Other | Admitting: Internal Medicine

## 2014-02-18 ENCOUNTER — Encounter: Payer: Self-pay | Admitting: Internal Medicine

## 2014-02-18 VITALS — BP 122/64 | HR 74 | Temp 97.8°F | Resp 16 | Ht 65.0 in | Wt 126.0 lb

## 2014-02-18 DIAGNOSIS — D509 Iron deficiency anemia, unspecified: Secondary | ICD-10-CM

## 2014-02-18 DIAGNOSIS — Z23 Encounter for immunization: Secondary | ICD-10-CM

## 2014-02-18 DIAGNOSIS — N3000 Acute cystitis without hematuria: Secondary | ICD-10-CM

## 2014-02-18 DIAGNOSIS — R6889 Other general symptoms and signs: Secondary | ICD-10-CM

## 2014-02-18 DIAGNOSIS — N3020 Other chronic cystitis without hematuria: Secondary | ICD-10-CM

## 2014-02-18 DIAGNOSIS — R899 Unspecified abnormal finding in specimens from other organs, systems and tissues: Secondary | ICD-10-CM

## 2014-02-18 DIAGNOSIS — N390 Urinary tract infection, site not specified: Secondary | ICD-10-CM

## 2014-02-18 LAB — URINALYSIS, ROUTINE W REFLEX MICROSCOPIC
Bilirubin Urine: NEGATIVE
Ketones, ur: NEGATIVE
NITRITE: POSITIVE — AB
Total Protein, Urine: NEGATIVE
URINE GLUCOSE: NEGATIVE
Urobilinogen, UA: 0.2 (ref 0.0–1.0)
pH: 5.5 (ref 5.0–8.0)

## 2014-02-18 LAB — POCT URINALYSIS DIPSTICK
Bilirubin, UA: NEGATIVE
GLUCOSE UA: NEGATIVE
Ketones, UA: NEGATIVE
NITRITE UA: NEGATIVE
Protein, UA: NEGATIVE
Spec Grav, UA: 1.01
Urobilinogen, UA: 0.2
pH, UA: 5

## 2014-02-18 MED ORDER — MIRTAZAPINE 15 MG PO TABS: 15.0000 mg | ORAL_TABLET | Freq: Every day | ORAL | Status: DC

## 2014-02-18 MED ORDER — TEMAZEPAM 22.5 MG PO CAPS
22.5000 mg | ORAL_CAPSULE | Freq: Every evening | ORAL | Status: DC | PRN
Start: 2014-02-18 — End: 2015-05-05

## 2014-02-18 NOTE — Patient Instructions (Addendum)
Take your cranberry tablets every 12 hours to keep your urine from getting infectied  Your stools will turn back from iron.  I am checking your hgb today   We are running tests on your urine  Today  To see if you still have an infection  I have refilled temazepam and mirtazipine for your sleep .  If it is making you too groggy in the morning.,  Cut the mirtazipine in half  You received the new pneumonia vaccine today

## 2014-02-18 NOTE — Assessment & Plan Note (Signed)
She submitted a urine today but does not appear to be in discomofrt or toxic appearing.  Did not take the last Abx Dr Ola Spurr prescribed due to cos.t

## 2014-02-18 NOTE — Progress Notes (Signed)
Patient ID: Evelyn [name], female   DOB: 04-13-1920, 80 y.o.   MRN: [id number]   Patient Active Problem List   Diagnosis Date Noted  . Diarrhea 12/18/2013  . Rectal pain 12/14/2013  . ESBL (extended spectrum beta-lactamase) producing bacteria infection 12/09/2013  . Unspecified vitamin D deficiency 11/25/2013  . Chronic insomnia 11/23/2013  . History of recent fall 11/23/2013  . Bradycardia 04/10/2013  . Generalized muscle weakness 04/10/2013  . Anemia, iron deficiency 12/27/2012  . Dysphagia, pharyngoesophageal phase 07/06/2012  . Anemia, pernicious 10/15/2011  . Spinal stenosis of lumbar region 10/15/2011  . Hypertension   . Hyperlipidemia   . Unspecified hypothyroidism   . Other chronic cystitis   . History of lymphoma   . Urinary tract infection 07/14/2011    Subjective:  CC:   Chief Complaint  Patient presents with  . Follow-up    Patient having black stools, and still thinks she has a UTI.    HPI:   Evelyn [name] is a 80 y.o. female who presents for Follow up on chronic conditions including recurrent UTI , heme positive stools, iron deficiency anemia  She is a poor historian  And was apparently given prescription for some medication by some doctor an it was $174 so she didn't buy it  She has had no recent falls,  Has run out of both medicationsOut of both sleep medications .  Having bladder pain, which is chronic .  Denies hematuria m nausea, fevers and increased back pain .   Past Medical History  Diagnosis Date  . Hyperlipidemia   . Thyroid disease     had thyroid removed  . Other chronic cystitis     Radiation , botox injections  . History of lymphoma   . Self-catheterizes urinary bladder     twice daily  . Coronary artery disease   . Coarse tremors     head shakes if does not take propranolol  . Insomnia     takes remeron nightly  . Cancer     lymphomia  . Anemia     hx of blood transfusion    Past Surgical History  Procedure  Laterality Date  . Cardiac catheterization  2005    2 cardiac stents  . Abdominal hysterectomy  1970s  . Eye surgery      bilateral cataracts  . Joint replacement  2012    right hip  . Stomach surgery      removal all but small portion of stomach out due to ulcers  . Appendectomy    . Back surgery      lower back - bulging disk  . Spine surgery  2008    [place]  . Lumbar laminectomy/decompression microdiscectomy  01/24/2012    Procedure: LUMBAR LAMINECTOMY/DECOMPRESSION MICRODISCECTOMY 2 LEVELS;  Surgeon: Floyce Stakes, MD;  Location: [place] [facility];  Service: Neurosurgery;  Laterality: Right;  Right Lumbar four five, lumbar five sacral one foraminotomy   . Cystoscopy  April 2013       The following portions of the patient's history were reviewed and updated as appropriate: Allergies, current medications, and problem list.    Review of Systems:   Patient denies headache, fevers, malaise, unintentional weight loss, skin rash, eye pain, sinus congestion and sinus pain, sore throat, dysphagia,  hemoptysis , cough, dyspnea, wheezing, chest pain, palpitations, orthopnea, edema, abdominal pain, nausea, melena, diarrhea, constipation, flank pain, dysuria, hematuria, urinary  Frequency, nocturia, numbness, tingling, seizures,  Focal weakness, Loss of consciousness,  Tremor, insomnia, depression, anxiety, and suicidal ideation.     History   Social History  . Marital Status: Widowed    Spouse Name: N/A    Number of Children: N/A  . Years of Education: N/A   Occupational History  . Not on file.   Social History Main Topics  . Smoking status: Former Smoker    Quit date: 04/04/1971  . Smokeless tobacco: Never Used  . Alcohol Use: No  . Drug Use: No  . Sexual Activity: Not on file   Other Topics Concern  . Not on file   Social History Narrative  . No narrative on file    Objective:  Filed Vitals:   02/18/14 1447  BP: 122/64  Pulse: 74  Temp: 97.8 F (36.6 C)   Resp: 16     General appearance: alert, cooperative and appears stated age Ears: normal TM's and external ear canals both ears Throat: lips, mucosa, and tongue normal; teeth and gums normal Neck: no adenopathy, no carotid bruit, supple, symmetrical, trachea midline and thyroid not enlarged, symmetric, no tenderness/mass/nodules Back: symmetric, no curvature. ROM normal. No CVA tenderness. Lungs: clear to auscultation bilaterally Heart: regular rate and rhythm, S1, S2 normal, no murmur, click, rub or gallop Abdomen: soft, non-tender; bowel sounds normal; no masses,  no organomegaly Pulses: 2+ and symmetric Skin: Skin color, texture, turgor normal. No rashes or lesions Lymph nodes: Cervical, supraclavicular, and axillary nodes normal.  Assessment and Plan:  Other chronic cystitis She submitted a urine today but does not appear to be in discomofrt or toxic appearing.  Did not take the last Abx Dr Ola Spurr prescribed due to cos.t  Anemia, iron deficiency Checking cbc today   Urinary tract infection Recurrent,  Last infection not treated  secondary to cost of medication.  Repeat urinalysis shows pyuria and 100K of GNRs .  Awaiting urine culture results.    Updated Medication List Outpatient Encounter Prescriptions as of 02/18/2014  Medication Sig  . aspirin EC 81 MG tablet Take 81 mg by mouth daily.  Marland Kitchen conjugated estrogens (PREMARIN) vaginal cream Place 1 applicator in vagina every night for two weeks,  Then two times per week thereafter  . ergocalciferol (DRISDOL) 50000 UNITS capsule Take 1 capsule (50,000 Units total) by mouth once a week.  . ferrous sulfate 325 (65 FE) MG tablet Take 1 tablet (325 mg total) by mouth daily with breakfast.  . levothyroxine (SYNTHROID, LEVOTHROID) 100 MCG tablet TAKE ONE TABLET BY MOUTH EVERY DAY  . mirtazapine (REMERON) 15 MG tablet Take 1 tablet (15 mg total) by mouth at bedtime.  Marland Kitchen omeprazole (PRILOSEC) 40 MG capsule Take 1 capsule (40 mg total)  by mouth daily.  . propranolol (INDERAL) 20 MG tablet Take 1 tablet (20 mg total) by mouth 2 (two) times daily.  . temazepam (RESTORIL) 22.5 MG capsule Take 1 capsule (22.5 mg total) by mouth at bedtime as needed for sleep.  . [DISCONTINUED] mirtazapine (REMERON) 15 MG tablet Take 15 mg by mouth at bedtime.  . [DISCONTINUED] temazepam (RESTORIL) 22.5 MG capsule Take 1 capsule (22.5 mg total) by mouth at bedtime as needed for sleep.     Orders Placed This Encounter  Procedures  . CULTURE, URINE COMPREHENSIVE  . Pneumococcal conjugate vaccine 13-valent  . Urinalysis, Routine w reflex microscopic  . POCT urinalysis dipstick    No Follow-up on file.

## 2014-02-18 NOTE — Assessment & Plan Note (Signed)
Checking cbc today

## 2014-02-18 NOTE — Progress Notes (Signed)
Pre-visit discussion using our clinic review tool. No additional management support is needed unless otherwise documented below in the visit note.  

## 2014-02-19 LAB — CBC WITH DIFFERENTIAL/PLATELET
Basophils Absolute: 0.1 10*3/uL (ref 0.0–0.1)
Basophils Relative: 1.2 % (ref 0.0–3.0)
EOS ABS: 0.5 10*3/uL (ref 0.0–0.7)
EOS PCT: 5.8 % — AB (ref 0.0–5.0)
HEMATOCRIT: 33.4 % — AB (ref 36.0–46.0)
Hemoglobin: 10.9 g/dL — ABNORMAL LOW (ref 12.0–15.0)
LYMPHS ABS: 2.5 10*3/uL (ref 0.7–4.0)
Lymphocytes Relative: 27.7 % (ref 12.0–46.0)
MCHC: 32.5 g/dL (ref 30.0–36.0)
MCV: 85.9 fl (ref 78.0–100.0)
MONO ABS: 0.7 10*3/uL (ref 0.1–1.0)
Monocytes Relative: 7.4 % (ref 3.0–12.0)
NEUTROS PCT: 57.9 % (ref 43.0–77.0)
Neutro Abs: 5.2 10*3/uL (ref 1.4–7.7)
Platelets: 390 10*3/uL (ref 150.0–400.0)
RBC: 3.89 Mil/uL (ref 3.87–5.11)
RDW: 17.4 % — ABNORMAL HIGH (ref 11.5–15.5)
WBC: 9.1 10*3/uL (ref 4.0–10.5)

## 2014-02-19 LAB — BASIC METABOLIC PANEL
BUN: 19 mg/dL (ref 6–23)
CALCIUM: 9 mg/dL (ref 8.4–10.5)
CO2: 26 mEq/L (ref 19–32)
Chloride: 106 mEq/L (ref 96–112)
Creatinine, Ser: 1.2 mg/dL (ref 0.4–1.2)
GFR: 45.13 mL/min — AB (ref >60.00)
GLUCOSE: 102 mg/dL — AB (ref 70–99)
POTASSIUM: 4.1 meq/L (ref 3.5–5.1)
Sodium: 141 mEq/L (ref 135–145)

## 2014-02-20 ENCOUNTER — Encounter: Payer: Self-pay | Admitting: Internal Medicine

## 2014-02-20 LAB — CULTURE, URINE COMPREHENSIVE: Colony Count: >=100000

## 2014-02-20 NOTE — Assessment & Plan Note (Signed)
Recurrent,  Last infection not treated  secondary to cost of medication.  Repeat urinalysis shows pyuria and 100K of GNRs .  Awaiting urine culture results.

## 2014-02-23 MED ORDER — AMOXICILLIN-POT CLAVULANATE 875-125 MG PO TABS: 1.0000 | ORAL_TABLET | Freq: Two times a day (BID) | ORAL | Status: DC

## 2014-02-23 MED ORDER — PREDNISONE 10 MG PO TABS: 20.0000 mg | ORAL_TABLET | Freq: Every day | ORAL | Status: DC

## 2014-02-23 NOTE — Addendum Note (Signed)
Addended by: Crecencio Mc on: 02/23/2014 07:06 PM   Modules accepted: Orders

## 2014-02-24 ENCOUNTER — Encounter: Payer: Self-pay | Admitting: *Deleted

## 2014-03-02 ENCOUNTER — Telehealth: Payer: Self-pay | Admitting: Internal Medicine

## 2014-03-02 DIAGNOSIS — R197 Diarrhea, unspecified: Secondary | ICD-10-CM

## 2014-03-02 NOTE — Telephone Encounter (Signed)
Talked with patient and advised patient she should come by and pick up stool kit. Also advised patient again on taking a probiotic.Marland Kitchen

## 2014-03-02 NOTE — Telephone Encounter (Signed)
Patient called in to say that she is having diarrhea for last couple of days as much as 5 times per day patient did not pick up the probiotic she was advised to take. However, patient is taking the benadryl and prednisone as directed with the Augmentin. Advised patient she was suppose to be taking the probiotic. Please advise.

## 2014-03-02 NOTE — Telephone Encounter (Signed)
She probably has C dificile colitis bc she didn't take the probiotic.  She needs to start the probiotic and come in and give a stool sample for c dicile.  Remind her that it is highly contagious.

## 2014-03-03 ENCOUNTER — Other Ambulatory Visit: Payer: Self-pay | Admitting: Internal Medicine

## 2014-03-03 ENCOUNTER — Other Ambulatory Visit: Payer: Self-pay | Admitting: *Deleted

## 2014-03-03 DIAGNOSIS — R197 Diarrhea, unspecified: Secondary | ICD-10-CM

## 2014-03-04 ENCOUNTER — Other Ambulatory Visit: Payer: Self-pay | Admitting: *Deleted

## 2014-03-04 LAB — C. DIFFICILE GDH AND TOXIN A/B
C. DIFFICILE GDH: NOT DETECTED
C. difficile Toxin A/B: NOT DETECTED

## 2014-03-04 MED ORDER — OMEPRAZOLE 40 MG PO CPDR: 40.0000 mg | DELAYED_RELEASE_CAPSULE | Freq: Every day | ORAL | Status: DC

## 2014-03-23 ENCOUNTER — Encounter: Payer: Self-pay | Admitting: Adult Health

## 2014-03-23 ENCOUNTER — Ambulatory Visit (INDEPENDENT_AMBULATORY_CARE_PROVIDER_SITE_OTHER): Payer: Medicare Other | Admitting: Adult Health

## 2014-03-23 VITALS — BP 118/74 | HR 94 | Temp 97.7°F | Resp 16 | Wt 127.5 lb

## 2014-03-23 DIAGNOSIS — E785 Hyperlipidemia, unspecified: Secondary | ICD-10-CM

## 2014-03-23 DIAGNOSIS — Z0000 Encounter for general adult medical examination without abnormal findings: Secondary | ICD-10-CM

## 2014-03-23 NOTE — Progress Notes (Signed)
Subjective:    Evelyn [name] is a 80 y.o. female who presents for Medicare Annual/Subsequent preventive examination.  Preventive Screening-Counseling & Management  Tobacco History  Smoking status  . Former Smoker  . Quit date: 04/04/1971  Smokeless tobacco  . Never Used     Problems Prior to Visit 1.   Current Problems (verified) Patient Active Problem List   Diagnosis Date Noted  . Diarrhea 12/18/2013  . Rectal pain 12/14/2013  . ESBL (extended spectrum beta-lactamase) producing bacteria infection 12/09/2013  . Unspecified vitamin D deficiency 11/25/2013  . Chronic insomnia 11/23/2013  . History of recent fall 11/23/2013  . Bradycardia 04/10/2013  . Generalized muscle weakness 04/10/2013  . Anemia, iron deficiency 12/27/2012  . Dysphagia, pharyngoesophageal phase 07/06/2012  . Anemia, pernicious 10/15/2011  . Spinal stenosis of lumbar region 10/15/2011  . Hypertension   . Hyperlipidemia   . Unspecified hypothyroidism   . Other chronic cystitis   . History of lymphoma   . Urinary tract infection 07/14/2011    Medications Prior to Visit Current Outpatient Prescriptions on File Prior to Visit  Medication Sig Dispense Refill  . aspirin EC 81 MG tablet Take 81 mg by mouth daily.      Marland Kitchen conjugated estrogens (PREMARIN) vaginal cream Place 1 applicator in vagina every night for two weeks,  Then two times per week thereafter  42.5 g  12  . ergocalciferol (DRISDOL) 50000 UNITS capsule Take 1 capsule (50,000 Units total) by mouth once a week.  12 capsule  0  . ferrous sulfate 325 (65 FE) MG tablet Take 1 tablet (325 mg total) by mouth daily with breakfast.  30 tablet  5  . levothyroxine (SYNTHROID, LEVOTHROID) 100 MCG tablet TAKE ONE TABLET BY MOUTH EVERY DAY  90 tablet  2  . mirtazapine (REMERON) 15 MG tablet Take 1 tablet (15 mg total) by mouth at bedtime.  90 tablet  2  . omeprazole (PRILOSEC) 40 MG capsule Take 1 capsule (40 mg total) by mouth daily.  90 capsule  1   . predniSONE (DELTASONE) 10 MG tablet Take 2 tablets (20 mg total) by mouth daily with breakfast.  14 tablet  0  . propranolol (INDERAL) 20 MG tablet Take 1 tablet (20 mg total) by mouth 2 (two) times daily.  180 tablet  3  . temazepam (RESTORIL) 22.5 MG capsule Take 1 capsule (22.5 mg total) by mouth at bedtime as needed for sleep.  30 capsule  5  . [DISCONTINUED] esomeprazole (NEXIUM) 40 MG capsule Take 1 capsule (40 mg total) by mouth daily.  30 capsule  6   No current facility-administered medications on file prior to visit.    Current Medications (verified) Current Outpatient Prescriptions  Medication Sig Dispense Refill  . aspirin EC 81 MG tablet Take 81 mg by mouth daily.      Marland Kitchen conjugated estrogens (PREMARIN) vaginal cream Place 1 applicator in vagina every night for two weeks,  Then two times per week thereafter  42.5 g  12  . ergocalciferol (DRISDOL) 50000 UNITS capsule Take 1 capsule (50,000 Units total) by mouth once a week.  12 capsule  0  . ferrous sulfate 325 (65 FE) MG tablet Take 1 tablet (325 mg total) by mouth daily with breakfast.  30 tablet  5  . levothyroxine (SYNTHROID, LEVOTHROID) 100 MCG tablet TAKE ONE TABLET BY MOUTH EVERY DAY  90 tablet  2  . mirtazapine (REMERON) 15 MG tablet Take 1 tablet (15 mg total) by mouth  at bedtime.  90 tablet  2  . omeprazole (PRILOSEC) 40 MG capsule Take 1 capsule (40 mg total) by mouth daily.  90 capsule  1  . predniSONE (DELTASONE) 10 MG tablet Take 2 tablets (20 mg total) by mouth daily with breakfast.  14 tablet  0  . propranolol (INDERAL) 20 MG tablet Take 1 tablet (20 mg total) by mouth 2 (two) times daily.  180 tablet  3  . temazepam (RESTORIL) 22.5 MG capsule Take 1 capsule (22.5 mg total) by mouth at bedtime as needed for sleep.  30 capsule  5  . [DISCONTINUED] esomeprazole (NEXIUM) 40 MG capsule Take 1 capsule (40 mg total) by mouth daily.  30 capsule  6   No current facility-administered medications for this visit.      Allergies (verified) Oxycodone; Penicillins; Prednisone; and Sulfa antibiotics   PAST HISTORY  Family History Family History  Problem Relation Age of Onset  . Diabetes Mother   . Heart disease Father     Social History History  Substance Use Topics  . Smoking status: Former Smoker    Quit date: 04/04/1971  . Smokeless tobacco: Never Used  . Alcohol Use: No     Are there smokers in your home (other than you)? No  Risk Factors Current exercise habits: The patient does not participate in regular exercise at present.  Dietary issues discussed: Follows healthy diet. Lean protein, fruits and vegetables.   Cardiac risk factors: advanced age (older than 53 for men, 37 for women), dyslipidemia, hypertension and sedentary lifestyle.  Depression Screen (Note: if answer to either of the following is "Yes", a more complete depression screening is indicated)   Over the past two weeks, have you felt down, depressed or hopeless? No  Over the past two weeks, have you felt little interest or pleasure in doing things? No  Have you lost interest or pleasure in daily life? No  Do you often feel hopeless? No  Do you cry easily over simple problems? No  Activities of Daily Living In your present state of health, do you have any difficulty performing the following activities?:  Driving? No Managing money?  No Feeding yourself? No Getting from bed to chair? No Climbing a flight of stairs? Yes Preparing food and eating?: No Bathing or showering? No Getting dressed: No Getting to the toilet? No Using the toilet:No Moving around from place to place: No In the past year have you fallen or had a near fall?:No   Are you sexually active?  No  Do you have more than one partner?  No  Hearing Difficulties: No Do you often ask people to speak up or repeat themselves? No. Wears hearing aids Do you experience ringing or noises in your ears? No Do you have difficulty understanding soft or  whispered voices? Yes, if does not have her hearing aids   Do you feel that you have a problem with memory? No  Do you often misplace items? No  Do you feel safe at home?  Yes  Cognitive Testing  Alert? Yes  Normal Appearance?Yes  Oriented to person? Yes  Place? Yes   Time? Yes  Recall of three objects?  Yes  Can perform simple calculations? Yes  Displays appropriate judgment?Yes  Can read the correct time from a watch face?Yes   Advanced Directives have been discussed with the patient? Yes  List the Names of Other Physician/Practitioners you currently use: 1.  Dr. Paulla Dolly - Podiatry 2.  Dr. Oliva Bustard -  Oncology 3.  Dr. Renard Hamper [facility] 4.  [facility] - hearing  Indicate any recent Medical Services you may have received from other than [facility] providers in the past year (date may be approximate).  Immunization History  Administered Date(s) Administered  . Influenza Split 06/13/2011, 06/06/2012  . Pneumococcal Conjugate-13 02/18/2014  . Tdap 07/14/2011    Screening Tests Health Maintenance  Topic Date Due  . Colonoscopy  06/25/1972  . Zostavax  06/25/1982  . Pneumococcal Polysaccharide Vaccine Age 78 And Over  06/26/1987  . Influenza Vaccine  02/28/2014  . Tetanus/tdap  07/13/2021    All answers were reviewed with the patient and necessary referrals were made:  Rey,Raquel, NP   03/23/2014   History reviewed: allergies, current medications, past family history, past medical history, past social history, past surgical history and problem list  Review of Systems No ROS. Medicare Wellness Exam    Objective:     Vision by Snellen chart: right eye:20/20, left eye:20/20. Followed at [facility] once a year.  Body mass index is 21.22 kg/(m^2). BP 118/74  Pulse 94  Temp(Src) 97.7 F (36.5 C) (Oral)  Resp 16  Wt 127 lb 8 oz (57.834 kg)  SpO2 98%  No exam performed today, Medicare Wellness.     Assessment:      This is a routine wellness   examination for this patient . I reviewed all health maintenance protocols including mammography, colonoscopy, bone density Needed referrals were placed. Age and diagnosis  appropriate screening labs were ordered. Her immunization history was reviewed and appropriate vaccinations were ordered. Her current medications and allergies were reviewed and needed refills of her chronic medications were ordered. The plan for yearly health maintenance was discussed all orders and referrals were made as appropriate.      Plan:     During the course of the visit the patient was educated and counseled about appropriate screening and preventive services including:    Influenza vaccine  Screening mammography  Bone densitometry screening  Diabetes screening  Advanced directives: has an advanced directive - a copy HAS NOT been provided.  Diet review for nutrition referral? Yes ____  Not Indicated _x___   Patient Instructions (the written plan) was given to the patient.  Medicare Attestation I have personally reviewed: The patient's medical and social history Their use of alcohol, tobacco or illicit drugs Their current medications and supplements The patient's functional ability including ADLs,fall risks, home safety risks, cognitive, and hearing and visual impairment Diet and physical activities Evidence for depression or mood disorders  The patient's weight, height, BMI, and visual acuity have been recorded in the chart.  I have made referrals, counseling, and provided education to the patient based on review of the above and I have provided the patient with a written personalized care plan for preventive services.     Rey,Raquel, NP   03/23/2014

## 2014-03-23 NOTE — Patient Instructions (Signed)
  You had your Medicare Wellness visit today.  Please have your labs drawn prior to leaving the office.  Your medication was sent to your pharmacy.  Call in approximately 2 weeks to schedule your flu vaccination.  You have a follow up appt with Dr. Derrel Nip on October 28 at 10:30 AM.

## 2014-03-23 NOTE — Progress Notes (Signed)
Pre visit review using our clinic review tool, if applicable. No additional management support is needed unless otherwise documented below in the visit note. 

## 2014-03-24 ENCOUNTER — Encounter: Payer: Self-pay | Admitting: Podiatry

## 2014-03-24 ENCOUNTER — Ambulatory Visit (INDEPENDENT_AMBULATORY_CARE_PROVIDER_SITE_OTHER): Payer: Medicare Other | Admitting: Podiatry

## 2014-03-24 VITALS — BP 107/71 | HR 101 | Resp 16 | Ht 66.0 in | Wt 127.0 lb

## 2014-03-24 DIAGNOSIS — L84 Corns and callosities: Secondary | ICD-10-CM

## 2014-03-24 DIAGNOSIS — M779 Enthesopathy, unspecified: Secondary | ICD-10-CM

## 2014-03-24 LAB — BASIC METABOLIC PANEL
BUN: 11 mg/dL (ref 6–23)
CO2: 27 meq/L (ref 19–32)
Calcium: 8.8 mg/dL (ref 8.4–10.5)
Chloride: 101 mEq/L (ref 96–112)
Creatinine, Ser: 1.1 mg/dL (ref 0.4–1.2)
GFR: 51.56 mL/min — AB (ref >60.00)
Glucose, Bld: 95 mg/dL (ref 70–99)
POTASSIUM: 4.1 meq/L (ref 3.5–5.1)
SODIUM: 137 meq/L (ref 135–145)

## 2014-03-24 LAB — HEPATIC FUNCTION PANEL
ALK PHOS: 66 U/L (ref 39–117)
ALT: 11 U/L (ref 0–35)
AST: 15 U/L (ref 0–37)
Albumin: 3.2 g/dL — ABNORMAL LOW (ref 3.5–5.2)
BILIRUBIN DIRECT: 0.1 mg/dL (ref 0.0–0.3)
TOTAL PROTEIN: 6.7 g/dL (ref 6.0–8.3)
Total Bilirubin: 0.3 mg/dL (ref 0.2–1.2)

## 2014-03-24 LAB — LIPID PANEL
CHOL/HDL RATIO: 6
Cholesterol: 260 mg/dL — ABNORMAL HIGH (ref 0–200)
HDL: 43.6 mg/dL (ref >39.00)
NonHDL: 216.4
Triglycerides: 268 mg/dL — ABNORMAL HIGH (ref 0.0–149.0)
VLDL: 53.6 mg/dL — ABNORMAL HIGH (ref 0.0–40.0)

## 2014-03-24 LAB — HEMOGLOBIN A1C: Hgb A1c MFr Bld: 6.1 % (ref 4.6–6.5)

## 2014-03-24 LAB — LDL CHOLESTEROL, DIRECT: LDL DIRECT: 210.1 mg/dL

## 2014-03-24 MED ORDER — TRIAMCINOLONE ACETONIDE 10 MG/ML IJ SUSP
10.0000 mg | Freq: Once | INTRAMUSCULAR | Status: AC
  Administered 2014-03-24: 10 mg

## 2014-03-24 NOTE — Progress Notes (Signed)
Subjective:     Patient ID: Evelyn [name], female   DOB: Oct 07, 1919, 80 y.o.   MRN: [id number]  HPI patient presents stating she has a painful corn with fluid on the fifth toe of her left foot and that what we did last year helped her for a long time   Review of Systems     Objective:   Physical Exam Neurovascular status intact with inflammation around the head of the proximal phalanx fifth toe left with keratotic lesion formation and fluid buildup    Assessment:     Capsulitis of the interphalangeal joint left fifth toe with keratotic lesion formation    Plan:     Sterile prep of the area done anesthetized with 60 mg Xylocaine Marcaine mixture and then injected the interphalangeal joint with 2 mg Kenalog and debrided the lesion fully and applied sterile dressing. Reappoint if symptoms persist

## 2014-03-24 NOTE — Progress Notes (Signed)
   Subjective:    Patient ID: Evelyn [name], female    DOB: 02-10-1920, 80 y.o.   MRN: [id number]  HPI Comments: i have a corn on my little toe on my left foot. It has a lot of pain in it. Ive had it for 2 - 3 weeks. Its gotten worse. Shoes will hurt. i have to go bare foot and it will be ok. i havent done anything for my toe.   Foot Pain      Review of Systems  HENT: Positive for hearing loss.   All other systems reviewed and are negative.      Objective:   Physical Exam        Assessment & Plan:

## 2014-03-25 ENCOUNTER — Encounter: Payer: Self-pay | Admitting: *Deleted

## 2014-04-03 ENCOUNTER — Telehealth: Payer: Self-pay | Admitting: Internal Medicine

## 2014-04-03 NOTE — Telephone Encounter (Signed)
Yes patient is wearing alert amulet. Patient has been encouraged to drink plenty of fluids . fyi

## 2014-04-03 NOTE — Telephone Encounter (Signed)
FYI

## 2014-04-03 NOTE — Telephone Encounter (Signed)
Pt called in and stated she is having dizzy spells since yesterday. She stated this morning walking back from the bathroom she had a time trying to get back to her chair and was wanting to get something to help with her dizzyness but stated is not wanting to leave the house. Transferred her to call a nurse triage.

## 2014-04-03 NOTE — Telephone Encounter (Addendum)
Patient that she has had dizziness all day especially when standing patient cannot drive and has no one with her. Patient stated she ate breakfast , this morning and feels fine as long as she remains seated patient stated she has no way of getting to Office until after 3.30 or to any there office, advised patient if dizziness becomes worse to call 911. FYI

## 2014-04-03 NOTE — Telephone Encounter (Signed)
The dizziness may be from dehydration or from her propranolol.  Is she wearing a Sports coach in case she falls?

## 2014-04-07 ENCOUNTER — Ambulatory Visit: Payer: Self-pay | Admitting: Oncology

## 2014-04-07 LAB — COMPREHENSIVE METABOLIC PANEL
Albumin: 3.3 g/dL — ABNORMAL LOW (ref 3.4–5.0)
Alkaline Phosphatase: 74 U/L
Anion Gap: 9 (ref 7–16)
BILIRUBIN TOTAL: 0.3 mg/dL (ref 0.2–1.0)
BUN: 13 mg/dL (ref 7–18)
CHLORIDE: 103 mmol/L (ref 98–107)
CO2: 28 mmol/L (ref 21–32)
Calcium, Total: 8.5 mg/dL (ref 8.5–10.1)
Creatinine: 1.16 mg/dL (ref 0.60–1.30)
EGFR (African American): 48 — ABNORMAL LOW
EGFR (Non-African Amer.): 41 — ABNORMAL LOW
Glucose: 135 mg/dL — ABNORMAL HIGH (ref 65–99)
OSMOLALITY: 282 (ref 275–301)
Potassium: 3.8 mmol/L (ref 3.5–5.1)
SGOT(AST): 13 U/L — ABNORMAL LOW (ref 15–37)
SGPT (ALT): 17 U/L
Sodium: 140 mmol/L (ref 136–145)
TOTAL PROTEIN: 7 g/dL (ref 6.4–8.2)

## 2014-04-07 LAB — CBC CANCER CENTER
BASOS ABS: 0.1 x10 3/mm (ref 0.0–0.1)
Basophil %: 0.8 %
Eosinophil #: 0.4 x10 3/mm (ref 0.0–0.7)
Eosinophil %: 3.7 %
HCT: 35 % (ref 35.0–47.0)
HGB: 11.1 g/dL — AB (ref 12.0–16.0)
LYMPHS ABS: 1.8 x10 3/mm (ref 1.0–3.6)
Lymphocyte %: 18.7 %
MCH: 27.4 pg (ref 26.0–34.0)
MCHC: 31.7 g/dL — ABNORMAL LOW (ref 32.0–36.0)
MCV: 87 fL (ref 80–100)
Monocyte #: 0.9 x10 3/mm (ref 0.2–0.9)
Monocyte %: 9.8 %
NEUTROS ABS: 6.5 x10 3/mm (ref 1.4–6.5)
Neutrophil %: 67 %
Platelet: 314 x10 3/mm (ref 150–440)
RBC: 4.04 10*6/uL (ref 3.80–5.20)
RDW: 17.9 % — ABNORMAL HIGH (ref 11.5–14.5)
WBC: 9.7 x10 3/mm (ref 3.6–11.0)

## 2014-04-15 ENCOUNTER — Ambulatory Visit: Payer: Self-pay | Admitting: Internal Medicine

## 2014-04-30 ENCOUNTER — Ambulatory Visit: Payer: Self-pay | Admitting: Oncology

## 2014-05-20 ENCOUNTER — Encounter: Payer: Self-pay | Admitting: Surgery

## 2014-05-26 LAB — WOUND AEROBIC CULTURE

## 2014-05-27 ENCOUNTER — Encounter: Payer: Self-pay | Admitting: Internal Medicine

## 2014-05-27 ENCOUNTER — Ambulatory Visit (INDEPENDENT_AMBULATORY_CARE_PROVIDER_SITE_OTHER): Payer: Medicare Other | Admitting: Internal Medicine

## 2014-05-27 VITALS — BP 102/64 | HR 120 | Temp 97.5°F | Resp 14 | Ht 66.0 in | Wt 129.0 lb

## 2014-05-27 DIAGNOSIS — D510 Vitamin B12 deficiency anemia due to intrinsic factor deficiency: Secondary | ICD-10-CM

## 2014-05-27 DIAGNOSIS — I10 Essential (primary) hypertension: Secondary | ICD-10-CM

## 2014-05-27 DIAGNOSIS — I472 Ventricular tachycardia: Secondary | ICD-10-CM

## 2014-05-27 DIAGNOSIS — E034 Atrophy of thyroid (acquired): Secondary | ICD-10-CM

## 2014-05-27 DIAGNOSIS — R000 Tachycardia, unspecified: Secondary | ICD-10-CM

## 2014-05-27 DIAGNOSIS — I4729 Other ventricular tachycardia: Secondary | ICD-10-CM

## 2014-05-27 DIAGNOSIS — D509 Iron deficiency anemia, unspecified: Secondary | ICD-10-CM

## 2014-05-27 DIAGNOSIS — E038 Other specified hypothyroidism: Secondary | ICD-10-CM

## 2014-05-27 DIAGNOSIS — B3749 Other urogenital candidiasis: Secondary | ICD-10-CM

## 2014-05-27 DIAGNOSIS — E538 Deficiency of other specified B group vitamins: Secondary | ICD-10-CM

## 2014-05-27 LAB — CBC WITH DIFFERENTIAL/PLATELET
BASOS PCT: 0.6 % (ref 0.0–3.0)
Basophils Absolute: 0.1 10*3/uL (ref 0.0–0.1)
EOS ABS: 0.6 10*3/uL (ref 0.0–0.7)
Eosinophils Relative: 4.9 % (ref 0.0–5.0)
HCT: 35.5 % — ABNORMAL LOW (ref 36.0–46.0)
Hemoglobin: 11.4 g/dL — ABNORMAL LOW (ref 12.0–15.0)
LYMPHS PCT: 27.2 % (ref 12.0–46.0)
Lymphs Abs: 3.2 10*3/uL (ref 0.7–4.0)
MCHC: 32.2 g/dL (ref 30.0–36.0)
MCV: 85.3 fl (ref 78.0–100.0)
Monocytes Absolute: 1.1 10*3/uL — ABNORMAL HIGH (ref 0.1–1.0)
Monocytes Relative: 9.5 % (ref 3.0–12.0)
Neutro Abs: 6.8 10*3/uL (ref 1.4–7.7)
Neutrophils Relative %: 57.8 % (ref 43.0–77.0)
Platelets: 361 10*3/uL (ref 150.0–400.0)
RBC: 4.16 Mil/uL (ref 3.87–5.11)
RDW: 17.1 % — ABNORMAL HIGH (ref 11.5–15.5)
WBC: 11.7 10*3/uL — AB (ref 4.0–10.5)

## 2014-05-27 LAB — COMPREHENSIVE METABOLIC PANEL
ALBUMIN: 3.2 g/dL — AB (ref 3.5–5.2)
ALT: 9 U/L (ref 0–35)
AST: 16 U/L (ref 0–37)
Alkaline Phosphatase: 71 U/L (ref 39–117)
BUN: 21 mg/dL (ref 6–23)
CALCIUM: 9.2 mg/dL (ref 8.4–10.5)
CHLORIDE: 104 meq/L (ref 96–112)
CO2: 18 meq/L — AB (ref 19–32)
Creatinine, Ser: 1.2 mg/dL (ref 0.4–1.2)
GFR: 45.99 mL/min — ABNORMAL LOW (ref >60.00)
GLUCOSE: 70 mg/dL (ref 70–99)
POTASSIUM: 4.4 meq/L (ref 3.5–5.1)
Sodium: 140 mEq/L (ref 135–145)
Total Bilirubin: 0.6 mg/dL (ref 0.2–1.2)
Total Protein: 6.4 g/dL (ref 6.0–8.3)

## 2014-05-27 LAB — MAGNESIUM: Magnesium: 2 mg/dL (ref 1.5–2.5)

## 2014-05-27 LAB — TSH: TSH: 11.88 u[IU]/mL — ABNORMAL HIGH (ref 0.35–4.50)

## 2014-05-27 MED ORDER — LEVOTHYROXINE SODIUM 112 MCG PO TABS: ORAL_TABLET | ORAL | Status: DC

## 2014-05-27 MED ORDER — METOPROLOL TARTRATE 25 MG PO TABS: 25.0000 mg | ORAL_TABLET | Freq: Two times a day (BID) | ORAL | Status: DC

## 2014-05-27 MED ORDER — CYANOCOBALAMIN 1000 MCG/ML IJ SOLN
1000.0000 ug | Freq: Once | INTRAMUSCULAR | Status: AC
  Administered 2014-05-27: 1000 ug via INTRAMUSCULAR

## 2014-05-27 MED ORDER — TRAZODONE HCL 50 MG PO TABS: ORAL_TABLET | ORAL | Status: DC

## 2014-05-27 NOTE — Progress Notes (Signed)
Pre-visit discussion using our clinic review tool. No additional management support is needed unless otherwise documented below in the visit note.  

## 2014-05-27 NOTE — Assessment & Plan Note (Addendum)
She has not been taking B12 due to shortage if injectibles.  Discussed need  to resume b12 injection

## 2014-05-27 NOTE — Patient Instructions (Addendum)
Please take the trazodone  At 8:30,  An hour earlier than when you usually take your temazepam.    Your propanolol is not controlling your heart rate very well,    I would like to switch you to metoprolol tartrate, twice daily for better control.  I have sent it to your pharmacy and would like you to take your first dose as soon as possible and continue  twice daily   Please return on Friday to have your pulse checked .  Make sure you have taken your metoprolol that morning

## 2014-05-27 NOTE — Progress Notes (Signed)
Patient ID: Evelyn [name], female   DOB: [birth year], 80 y.o.   MRN: [id number]   Patient Active Problem List   Diagnosis Date Noted  . Candidiasis of perineum 05/27/2014  . Tachycardia 05/27/2014  . Diarrhea 12/18/2013  . Rectal pain 12/14/2013  . ESBL (extended spectrum beta-lactamase) producing bacteria infection 12/09/2013  . Unspecified vitamin D deficiency 11/25/2013  . Chronic insomnia 11/23/2013  . History of recent fall 11/23/2013  . Bradycardia 04/10/2013  . Generalized muscle weakness 04/10/2013  . Anemia, iron deficiency 12/27/2012  . Dysphagia, pharyngoesophageal phase 07/06/2012  . Anemia, pernicious 10/15/2011  . Spinal stenosis of lumbar region 10/15/2011  . Hypertension   . Hyperlipidemia   . Hypothyroidism   . Other chronic cystitis   . History of lymphoma   . Urinary tract infection 07/14/2011    Subjective:  CC:   Chief Complaint  Patient presents with  . Follow-up    6 month    HPI:   Evelyn [name] is a 80 y.o. female who presents for   Follow up on chronic conditions including hypertension hypothyroidism, anemia , multifactorial.    Her insurance is no longer coverint temezepam for her chronic insomnia.  She has suffered no side effects from the temazepam and has been taking it for several years without incident, and cannot sleep without it.   Sent to wound center by her cardiologist, BK for evaluation of sacral area after reporting irritation.  She was treated for a sacral decub ulcer stage I , was treated with 7 days of antobiotics and  A tube of some unnamed powder.  Symptoms have resolved and she has not kept the follow up which is scheduled for today    Past Medical History  Diagnosis Date  . Hyperlipidemia   . Thyroid disease     had thyroid removed  . Other chronic cystitis     Radiation , botox injections  . History of lymphoma   . Self-catheterizes urinary bladder     twice daily  . Coronary artery disease   . Coarse  tremors     head shakes if does not take propranolol  . Insomnia     takes remeron nightly  . Cancer     lymphomia  . Anemia     hx of blood transfusion    Past Surgical History  Procedure Laterality Date  . Cardiac catheterization  2005    2 cardiac stents  . Abdominal hysterectomy  1970s  . Eye surgery      bilateral cataracts  . Joint replacement  2012    right hip  . Stomach surgery      removal all but small portion of stomach out due to ulcers  . Appendectomy    . Back surgery      lower back - bulging disk  . Spine surgery  2008    [place]  . Lumbar laminectomy/decompression microdiscectomy  01/24/2012    Procedure: LUMBAR LAMINECTOMY/DECOMPRESSION MICRODISCECTOMY 2 LEVELS;  Surgeon: Floyce Stakes, MD;  Location: [facility];  Service: Neurosurgery;  Laterality: Right;  Right Lumbar four five, lumbar five sacral one foraminotomy   . Cystoscopy  April 2013       The following portions of the patient's history were reviewed and updated as appropriate: Allergies, current medications, and problem list.    Review of Systems:   Patient denies headache, fevers, malaise, unintentional weight loss, skin rash, eye pain, sinus congestion and sinus pain, sore throat, dysphagia,  hemoptysis , cough, dyspnea, wheezing, chest pain, palpitations, orthopnea, edema, abdominal pain, nausea, melena, diarrhea, constipation, flank pain, dysuria, hematuria, urinary  Frequency, nocturia, numbness, tingling, seizures,  Focal weakness, Loss of consciousness,  Tremor, insomnia, depression, anxiety, and suicidal ideation.     History   Social History  . Marital Status: Widowed    Spouse Name: N/A    Number of Children: N/A  . Years of Education: N/A   Occupational History  . Not on file.   Social History Main Topics  . Smoking status: Former Smoker    Quit date: 04/04/1971  . Smokeless tobacco: Never Used  . Alcohol Use: No  . Drug Use: No  . Sexual Activity: Not on file    Other Topics Concern  . Not on file   Social History Narrative  . No narrative on file    Objective:  Filed Vitals:   05/27/14 1032  BP: 102/64  Pulse: 120  Temp: 97.5 F (36.4 C)  Resp: 14     General appearance: alert, cooperative and appears stated age Ears: normal TM's and external ear canals both ears Throat: lips, mucosa, and tongue normal; teeth and gums normal Neck: no adenopathy, no carotid bruit, supple, symmetrical, trachea midline and thyroid not enlarged, symmetric, no tenderness/mass/nodules Back: symmetric, no curvature. ROM normal. No CVA tenderness. Lungs: clear to auscultation bilaterally Heart: tachycardic,  Regular rhythm, no murmur, click, rub or gallop Abdomen: soft, non-tender; bowel sounds normal; no masses,  no organomegaly Pulses: 2+ and symmetric Skin: perinatal cleft erythema without skin breakdown or rash  Lymph nodes: Cervical, supraclavicular, and axillary nodes normal.  Assessment and Plan:  Anemia, pernicious She has not been taking B12 due to shortage if injectibles.  Discussed need  to resume b12 injection   Tachycardia She is tachycardic today despite taking propanolo this morning,  Changing to metoprolol 25 mg bid,  Return on Friday for recheck of pulse and BP.  Checking thyroid, CBC, lytes  etc today Lab Results  Component Value Date   TSH 11.88* 05/27/2014   Lab Results  Component Value Date   NA 140 05/27/2014   K 4.4 05/27/2014   CL 104 05/27/2014   CO2 18* 05/27/2014    Lab Results  Component Value Date   WBC 11.7* 05/27/2014   HGB 11.4* 05/27/2014   HCT 35.5* 05/27/2014   MCV 85.3 05/27/2014   PLT 361.0 05/27/2014     Hypothyroidism Thyroid function is underactive on current dose.  I have sent a higher dose of levothyroxine to her pharmacy and would like patient to change immediatley.   Hypertension Well controlled on current regimen. Renal function stable, no changes today.  Lab Results  Component Value  Date   CREATININE 1.2 05/27/2014   Lab Results  Component Value Date   NA 140 05/27/2014   K 4.4 05/27/2014   CL 104 05/27/2014   CO2 18* 05/27/2014     Anemia, iron deficiency With heme positive stool noted in may 2015 during workup.  GI referral to [facility] was made but no evaluation done due to hosppitalization for drug resistant UTI.  anemria has improved with oral iron therapy. Continue once daily dosing.  Given her age, no further workup is planned.    Lab Results  Component Value Date   WBC 11.7* 05/27/2014   HGB 11.4* 05/27/2014   HCT 35.5* 05/27/2014   MCV 85.3 05/27/2014   PLT 361.0 05/27/2014     Candidiasis of perineum Exam today  is consistent with either candidiasis or diaper dermatitis.  Since it is improving, I advised her to continue using the powder prescribed by [facility]  Twice daily until the redness resolves.    Updated Medication List Outpatient Encounter Prescriptions as of 05/27/2014  Medication Sig  . aspirin EC 81 MG tablet Take 81 mg by mouth daily.  Marland Kitchen conjugated estrogens (PREMARIN) vaginal cream Place 1 applicator in vagina every night for two weeks,  Then two times per week thereafter  . ergocalciferol (DRISDOL) 50000 UNITS capsule Take 1 capsule (50,000 Units total) by mouth once a week.  . ferrous sulfate 325 (65 FE) MG tablet Take 1 tablet (325 mg total) by mouth daily with breakfast.  . levothyroxine (SYNTHROID, LEVOTHROID) 112 MCG tablet TAKE ONE TABLET BY MOUTH EVERY DAY  . metoprolol tartrate (LOPRESSOR) 25 MG tablet Take 1 tablet (25 mg total) by mouth 2 (two) times daily.  . mirtazapine (REMERON) 15 MG tablet Take 1 tablet (15 mg total) by mouth at bedtime.  Marland Kitchen omeprazole (PRILOSEC) 40 MG capsule Take 1 capsule (40 mg total) by mouth daily.  . predniSONE (DELTASONE) 10 MG tablet Take 2 tablets (20 mg total) by mouth daily with breakfast.  . temazepam (RESTORIL) 22.5 MG capsule Take 1 capsule (22.5 mg total) by mouth at bedtime as needed  for sleep.  . traZODone (DESYREL) 50 MG tablet One tablet after dinner for insomnia.  May repeat  Dose in one hour  If needed  . [DISCONTINUED] levothyroxine (SYNTHROID, LEVOTHROID) 100 MCG tablet TAKE ONE TABLET BY MOUTH EVERY DAY  . [DISCONTINUED] propranolol (INDERAL) 20 MG tablet Take 1 tablet (20 mg total) by mouth 2 (two) times daily.  . [EXPIRED] cyanocobalamin ((VITAMIN B-12)) injection 1,000 mcg      Orders Placed This Encounter  Procedures  . CBC with Differential  . TSH  . Magnesium  . Comprehensive metabolic panel  . EKG 12-Lead    No Follow-up on file.

## 2014-05-29 ENCOUNTER — Ambulatory Visit: Payer: Medicare Other | Admitting: *Deleted

## 2014-05-29 VITALS — BP 100/64 | HR 135 | Temp 97.7°F | Resp 16

## 2014-05-29 DIAGNOSIS — I480 Paroxysmal atrial fibrillation: Secondary | ICD-10-CM | POA: Insufficient documentation

## 2014-05-29 DIAGNOSIS — R000 Tachycardia, unspecified: Secondary | ICD-10-CM

## 2014-05-29 NOTE — Assessment & Plan Note (Addendum)
With heme positive stool noted in may 2015 during workup.  GI referral to [facility] was made but no evaluation done due to hosppitalization for drug resistant UTI.  anemria has improved with oral iron therapy. Continue once daily dosing.  Given her age, no further workup is planned.    Lab Results  Component Value Date   WBC 11.7* 05/27/2014   HGB 11.4* 05/27/2014   HCT 35.5* 05/27/2014   MCV 85.3 05/27/2014   PLT 361.0 05/27/2014

## 2014-05-29 NOTE — Assessment & Plan Note (Signed)
She is tachycardic today despite taking propanolo this morning,  Changing to metoprolol 25 mg bid,  Return on Friday for recheck of pulse and BP.  Checking thyroid, CBC, lytes  etc today Lab Results  Component Value Date   TSH 11.88* 05/27/2014   Lab Results  Component Value Date   NA 140 05/27/2014   K 4.4 05/27/2014   CL 104 05/27/2014   CO2 18* 05/27/2014    Lab Results  Component Value Date   WBC 11.7* 05/27/2014   HGB 11.4* 05/27/2014   HCT 35.5* 05/27/2014   MCV 85.3 05/27/2014   PLT 361.0 05/27/2014

## 2014-05-29 NOTE — Assessment & Plan Note (Signed)
Exam today is consistent with either candidiasis or diaper dermatitis.  Since it is improving, I advised her to continue using the powder prescribed by [facility]  Twice daily until the redness resolves.

## 2014-05-29 NOTE — Progress Notes (Unsigned)
Patient seen in Nurse visit for recheck of vitals after starting Metoprolol 25 mg BID patient presented with HR @ 135 and BP 100/64 Notified physician and given verbal to arrange for patient to see cardiology today called [facility] office for an apointment patient to be seen at 11.30 today at cardiology patient notified by MD to go directly to Cardiology.

## 2014-05-29 NOTE — Assessment & Plan Note (Signed)
Thyroid function is underactive on current dose.  I have sent a higher dose of levothyroxine to her pharmacy and would like patient to change immediatley.

## 2014-05-29 NOTE — Assessment & Plan Note (Signed)
Well controlled on current regimen. Renal function stable, no changes today.  Lab Results  Component Value Date   CREATININE 1.2 05/27/2014   Lab Results  Component Value Date   NA 140 05/27/2014   K 4.4 05/27/2014   CL 104 05/27/2014   CO2 18* 05/27/2014

## 2014-06-02 ENCOUNTER — Ambulatory Visit: Payer: Medicare Other

## 2014-06-04 DIAGNOSIS — I5033 Acute on chronic diastolic (congestive) heart failure: Secondary | ICD-10-CM | POA: Insufficient documentation

## 2014-07-07 ENCOUNTER — Telehealth: Payer: Self-pay | Admitting: *Deleted

## 2014-07-07 NOTE — Telephone Encounter (Signed)
Pt left name, but no message.

## 2014-07-10 ENCOUNTER — Ambulatory Visit (INDEPENDENT_AMBULATORY_CARE_PROVIDER_SITE_OTHER): Payer: Medicare Other | Admitting: Podiatry

## 2014-07-10 VITALS — BP 137/72 | HR 92 | Resp 16

## 2014-07-10 DIAGNOSIS — L84 Corns and callosities: Secondary | ICD-10-CM

## 2014-07-11 NOTE — Progress Notes (Signed)
Subjective:     Patient ID: Evelyn [name], female   DOB: Sep 18, 1919, 80 y.o.   MRN: [id number]  HPI patient presents stating this pain in my fifth little toe has returned and even though I had several months of complete relief it is bothering me again   Review of Systems     Objective:   Physical Exam Neurovascular status unchanged with keratotic lesion fifth toe left foot with redness of a mild localized nature and mild inflammation around the joint    Assessment:     Hammertoe deformity with keratotic lesion fifth left and fluid buildup    Plan:     Educated her on deformity and I cannot do interphalangeal injection at each visit but we'll try every other visit and today debris did lesion applied padding and instructed on soaks and wide shoes

## 2014-08-26 ENCOUNTER — Telehealth: Payer: Self-pay | Admitting: *Deleted

## 2014-08-26 NOTE — Telephone Encounter (Signed)
Pt states her toe with the corn really is hurting.  I transferred her to schedulers to set up appt tomorrow with Dr. Paulla Dolly.

## 2014-08-26 NOTE — Telephone Encounter (Signed)
Pt states she has a very painful corn.  I left message instructing pt to begin the epsom salt soaks as instructed at last visit, and apply an antibiotic ointment to the area and call for an appt.

## 2014-08-26 NOTE — Telephone Encounter (Signed)
Entered in error

## 2014-08-27 ENCOUNTER — Ambulatory Visit (INDEPENDENT_AMBULATORY_CARE_PROVIDER_SITE_OTHER): Payer: PPO | Admitting: Podiatry

## 2014-08-27 ENCOUNTER — Encounter: Payer: Self-pay | Admitting: Podiatry

## 2014-08-27 VITALS — BP 119/73 | HR 94 | Resp 18

## 2014-08-27 DIAGNOSIS — B351 Tinea unguium: Secondary | ICD-10-CM

## 2014-08-27 DIAGNOSIS — M79676 Pain in unspecified toe(s): Secondary | ICD-10-CM

## 2014-08-27 DIAGNOSIS — L84 Corns and callosities: Secondary | ICD-10-CM

## 2014-08-27 NOTE — Patient Instructions (Signed)
Monitor for any signs/symptoms of infection. Call the office immediately if any occur or go directly to the emergency room. Call with any questions/concerns.  

## 2014-08-29 ENCOUNTER — Other Ambulatory Visit: Payer: Self-pay | Admitting: Internal Medicine

## 2014-09-01 NOTE — Progress Notes (Signed)
Patient ID: Evelyn [name], female   DOB: 1919-09-17, 81 y.o.   MRN: [id number]  Subjective: 81 year old female returns the office they with complaints of a painful corn of the left fifth toe. She states that she has continued to have pain over the last month overlying the area and she has difficulty with certain shoes and pressure to the area. She denies any recent redness or drainage from around the site. Denies any recent injury or trauma. She also states her nails are painful and elongated and she is unable to trim them herself. She denies any recent redness or drainage from the nail sites. No other complaints at this time.  Objective: AAO 3, NAD DP/PT pulses palpable bilaterally, CRT less than 3 seconds  Protective sensation intact with Simms Weinstein monofilament, Achilles tendon reflex intact. There is a hyperkeratotic lesion along the dorsal lateral aspect of the left fifth toe with underlying adductovarus deformity of the digit. Upon debridement of the lesion no underlying ulceration is no clinical signs of infection. Nails are also dystrophic, discolored, hypertrophic, elongated 10. There subjective tenderness along the toenails. No swelling erythema or drainage around the nails. No other open lesions or pre-ulcer lesions identified. No other areas of tenderness to bilateral lower extremity's. No overlying edema, erythema, increase in warmth bilaterally. MMT 5/5, ROM WNL No pain with calf compression, swelling, warmth, erythema.  Assessment: 81 year old female with symptomatic corn left fifth digit; likely onychomycosis  Plan: -Treatment options were discussed with the patient going alternatives, risks, competitions. -Hyperkeratotic lesion on the dorsal lateral aspect of the left toe is sharply debrided without complications to patient comfort. Dispensed offloading pads to help protect the area. Discussion gear modifications. -Nail sharply debrided 10 without  complication/bleeding. -Discussed the importance of daily foot inspection. -Follow-up in 3 months or sooner should any problems arise. In the meantime, encouraged to call the office with any questions, concerns, change in symptoms.

## 2014-10-15 ENCOUNTER — Ambulatory Visit
Admit: 2014-10-15 | Disposition: A | Discharge status: Home / Self Care | Payer: Self-pay | Attending: Oncology | Admitting: Oncology

## 2014-10-30 ENCOUNTER — Ambulatory Visit
Admit: 2014-10-30 | Disposition: A | Discharge status: Home / Self Care | Payer: Self-pay | Attending: Oncology | Admitting: Oncology

## 2014-11-20 NOTE — H&P (Signed)
PATIENT NAME:  Evelyn [name], Evelyn [name] MR#:  [id number] DATE OF BIRTH:  09/01/1919  PRIMARY CARE PHYSICIAN: Dr. Deborra Medina.   CHIEF COMPLAINT: Generalized weakness.   HISTORY OF PRESENT ILLNESS: This is a 81 year old female who presents to the hospital with progressive generalized weakness, ongoing now for the past few weeks. The patient was recently seen by her primary care physician and started on antibiotic therapy for a UTI and also possible eye infection. She has been taking the Macrobid for her UTI and also taking the tobramycin eye drops, but her clinical symptoms have not improved. The patient feels more and more increasingly weak, has a poor appetite, and has not been able to get out of bed.   She therefore came to the ER for further evaluation today. The patient was noted to be tachycardic, slightly on the hypotensive side, and also noted to have abnormal urinalysis consistent with a UTI. Hospitalist services were contacted for further treatment and evaluation.   The patient does admit to nausea but no vomiting. She denies any dysuria or hematuria or any frequency. She denies any chest pain. She does admit to some shortness of breath which is progressively worse over the past week or so. Admits to a cough but nonproductive. No diarrhea, no abdominal pain and no other associated symptoms presently.   The patient on triage was noted to be tachycardic with heart rates in the 120s, hypotensive. Hospitalist services were contacted for further treatment and evaluation.   REVIEW OF SYSTEMS:  CONSTITUTIONAL: No documented fever. No weight gain. No weight loss.  EYES: No blurred or double vision.  ENT: No tinnitus. No postnasal drip. No redness of the oropharynx.  RESPIRATORY: No cough. No wheeze. No hemoptysis. Positive dyspnea.  CARDIOVASCULAR: No chest pain. No orthopnea. No palpitations. No syncope.  GASTROINTESTINAL: Positive nausea. No vomiting. No diarrhea. No abdominal pain. No melena or  hematochezia.  GENITOURINARY: No dysuria. No hematuria.  ENDOCRINE: No polyuria or nocturia. No heat or cold intolerance.  HEMATOLOGIC: No anemia. No bruising. No bleeding.  INTEGUMENTARY: No rashes. No lesions.  MUSCULOSKELETAL: No arthritis. No swelling. No gout.  NEUROLOGIC: No numbness or tingling. No ataxia. No seizure-type activity.  PSYCHIATRIC: No anxiety. No insomnia. No ADD.   PAST MEDICAL HISTORY: Consistent with history of lymphoma, recurrent in nature;  history of coronary artery disease status post stent placement; history of tremors; urinary incontinence with recurrent UTIs; hypothyroidism.   ALLERGIES: CEPHALOSPORIN, CIPRO, NITROFURANTOIN, PENICILLIN, QUINOLONES. SOME OF THESE MAY NOT BE TRUE ALLERGIES BUT POSSIBLE SIDE EFFECTS, BUT THE PATIENT CANNOT  RECALL WHICH ONES.   SOCIAL HISTORY: Used to be a smoker, quit many, many years ago. No alcohol abuse. No illicit drug abuse. Lives by herself.   FAMILY HISTORY: Both mother and father are deceased from complications of a stroke.   CURRENT MEDICATIONS: As follows: Propranolol 20 mg b.i.d.; temazepam 15 mg at bedtime; Synthroid 100 mcg daily; dexamethasone; tobramycin to the affected eye, one drop daily; aspirin 81 mg daily.   ADMISSION PHYSICAL EXAMINATION:  VITAL SIGNS: Temperature 97.5, pulse 105, respirations 18, blood pressure 109/75, saturations 96% on room air.  GENERAL: A pleasant-appearing female in no apparent distress.  HEENT: Atraumatic, normocephalic. Extraocular muscles are intact. Pupils are equal and reactive to light. Sclerae anicteric. No conjunctival injection. No oropharyngeal erythema.  NECK: Supple. There is no jugular venous distention. No bruits, no lymphadenopathy, no thyromegaly.  HEART: Regular rate and rhythm. No murmurs, no rubs, no clicks.  LUNGS: Clear to auscultation  bilaterally. No rales or rhonchi. No wheezes.  ABDOMEN: Soft, flat, nontender, nondistended. Has good bowel sounds. No   hepatosplenomegaly appreciated.  EXTREMITIES: No evidence of any cyanosis, clubbing, or peripheral edema. Has +2 pedal and radial pulses bilaterally.  NEUROLOGIC: Alert, awake, and oriented x3 with no focal motor or sensory deficits bilaterally.  SKIN: Moist and warm with no rashes appreciated.  LYMPHATIC: No cervical or axial lymphadenopathy.   LABORATORY EXAM: A serum glucose 187, BUN 16, creatinine 1.05, sodium 135, potassium 3.9, chloride 102, bicarb 26. LFTs are within normal limits. Troponin 0.03. White cell count 13, hemoglobin 11.8, hematocrit 35.6, platelet count of 575. Urinalysis showed 3+ leukocyte esterase with positive nitrites and 179 white cells with 2+ bacteria.   DIAGNOSTIC STUDIES: The patient did have chest x-ray done which showed no evidence of acute cardiopulmonary disease.   ASSESSMENT AND PLAN: This is a 81 year old female with history of recurrent lymphoma, history of coronary artery disease status post stent placement, history of tremors, urinary incontinence, recurrent urinary tract infections, hypothyroidism, who presents to the hospital due to generalized weakness, lethargy, and noted to have a urinary tract infection.   1.  Generalized weakness. The exact etiology of this is unclear but suspected to be related to the untreated urinary tract infection with possible underlying deconditioning. I will start the patient on intravenous antibiotics and follow her clinically. We will get a physical therapy consult to assess her mobility. She already uses a walker to ambulate.  2.  Urinary tract infection. The patient has a previous history of recurrent urinary tract infections. She was noted to have klebsiella in her urine in 2012. Apparently was taking Macrobid at home but not improving. For now will start the patient on intravenous ciprofloxacin and follow her urine cultures and follow her clinically.  3.  Shortness of breath/tachycardia. Questionable if the tachycardia is  related to the urinary tract infection and dehydration, but the patient also is complaining of shortness of breath. Does have a history of lymphoma, therefore, high risk for having a pulmonary embolism/deep vein thrombosis. I cannot do a CT chest given her acute renal failure. Therefore, I will get a V/Q scan to rule out a pulmonary embolism. The patient recently had Dopplers of her right lower extremity to rule out deep vein thrombosis about three weeks ago which were negative. She may also need to be assessed for home oxygen prior to discharge.  4.  Hypothyroidism. Continue Synthroid.  5.  History of tremors. Continue propranolol.   CODE STATUS: DNI//DNR.  This was discussed with the patient.   Time spent on admission is 50 minutes.   ____________________________ Belia Heman. Verdell Carmine, MD vjs:np D: 03/11/2013 15:47:50 ET T: 03/11/2013 16:33:36 ET JOB#: [id number]  cc: Belia Heman. Verdell Carmine, MD, <Dictator> Henreitta Leber MD ELECTRONICALLY SIGNED 03/24/2013 13:33

## 2014-11-20 NOTE — Discharge Summary (Signed)
PATIENT NAME:  Evelyn [name], Evelyn [name] MR#:  [id number] DATE OF BIRTH:  09-04-1919  DATE OF ADMISSION:  03/11/2013 DATE OF DISCHARGE:  03/13/2013  PRESENTING COMPLAINT:  Not feeling well.   DISCHARGE DIAGNOSES:  1.  Klebsiella urinary tract infection.  2.  Generalized weakness, deconditioning due to urinary tract infection.  3.  Shortness of breath, tachycardia, improved.  4.  Hypothyroidism.  5.  A history of tremors.   CONDITION ON DISCHARGE:  Fair.   CODE STATUS:  NO CODE. DO NOT RESUSCITATE.   DIET:  Regular diet.   Continue physical therapy.   FOLLOWUP:  With primary care physician, Dr. Derrel Nip, after discharge from Rehab.   MEDICATIONS AT DISCHARGE:  1.  TobraDex one drop both eyes q.24.  2.  Temazepam 15 mg at bedtime p.r.n.  3.  Cipro 500 b.i.d. for 5 days.  4.  Tylenol 650 mg p.o. q.4 p.r.n.  5.  Aspirin 81 mg daily.  6.  Inderal 20 mg b.i.d.  7.  Remeron 15 mg at bedtime.  8.  Synthroid 0.1 mg daily.   LABORATORY, DIAGNOSTIC, AND RADIOLOGICAL DATA:  Urine culture positive for klebsiella oxytoca. Blood culture 1 out of 2 positive in the anaerobic bottle only.   Lactic acid 2.1.   CHEST X-RAY:  Increased density in the left mid and lower lung is worrisome from atelectasis. There is underlying COPD.  EKG shows accelerated junctional rhythm.   White count is 13.3, H and H is 11.8 and 35.6.   BUN and creatinine is 16 and 1.05, sodium is 135.   Troponin 0.03.   B-type natriuretic peptide is 9245.   BRIEF SUMMARY OF HOSPITAL COURSE:  Ms. Buske is a 81 year old Caucasian female who has a history of coronary artery disease, status post stent placement, a history of tremors, urinary incontinence, recurrent UTI, hypothyroidism, presented with generalized weakness, lethargic and noted to have UTI. She was admitted with:  1.  Generalized weakness. The exact etiology of this is unclear, but suspected to be related to underlying untreated UTI with possible deconditioning. She  was started on IV Cipro. Cipro was listed as one of her allergy meds. She did not know any clear-cut allergy reaction to it. She tolerated Cipro well while in-house. Her urine culture growing klebsiella oxytoca, which is sensitive to Cipro. She remained afebrile. White count is stable. The patient will benefit from rehab for PT and hence has accepted a bed at [facility] for rehab. She will be discharged today.  2.  Klebsiella UTI. The patient has had previous recurrent UTIs. She was actually taking Macrobid at home and had not had any improvement. She will complete a course with Cipro. 3.  Shortness of breath and tachycardia, improved. She had recent Doppler in her lower extremity, which was negative for DVT and lung VQ scan was negative had low probability for PE. Her sats on room air were 96%.  4.  Hypothyroidism. Continue Synthroid.  5.  A history of tremors. Continue Inderal.   Hospital stay otherwise remained stable.   CODE STATUS:  THE PATIENT REMAINED A NO CODE, DO NOT RESUSCITATE.   TIME SPENT:  40 minutes.   The discharge plan was discussed with the patient's son.   ____________________________ Hart Rochester. Posey Pronto, MD sap:jm D: 03/13/2013 13:38:28 ET T: 03/13/2013 15:12:09 ET JOB#: [id number]  cc: Yarel Kilcrease A. Posey Pronto, MD, <Dictator> Deborra Medina, MD Ilda Basset MD ELECTRONICALLY SIGNED 03/21/2013 5:43

## 2014-11-21 NOTE — Discharge Summary (Signed)
PATIENT NAME:  Evelyn [name], Evelyn [name] MR#:  [id number] DATE OF BIRTH:  1920/02/23  DATE OF ADMISSION:  12/26/2013 DATE OF DISCHARGE:  12/28/2013   ADMISSION DIAGNOSIS: Chest pain.   DISCHARGE DIAGNOSES:  1. Chest pain.  2. Extended-spectrum beta-lactamase urinary tract infection.  3. Hyperlipidemia.  CONSULTATION: Dr. Saralyn Pilar.   LABORATORIES:  White blood cells 9, hemoglobin 11, hematocrit 34.1, platelets are 349.  Sodium 143, potassium 3.8, chloride 108, bicarbonate 26, BUN 12, creatinine 0.98, glucose is 97.  Troponins x3 are negative.  LDL 191, cholesterol 282.  STRESS TEST NEGATIVE FOR ISCHEMIA  HOSPITAL COURSE: A 81 year old female who presented with chest pain. For further details, please refer to the H and P.   1. Chest pain. The patient was admitted to telemetry. Her cardiac enzymes were negative. Dr. Saralyn Pilar saw the patient in consultation. The patient underwent a Myoview stress test. She was continued on aspirin and propranolol. Her lipid panel showed an elevated LDL, so a statin was also started and nitroglycerin.  2. Urinary tract infection. The patient has persistent urinary tract infection. It turns out that she has ESBL as per the last urine culture. She has a midline cathetar placed for antibiotics total. She will get 11 days of antibiotics in rehab.  3. Coronary artery disease. The patient will continue outpatient medications.  4. Depression and anxiety. The patient will continue her home medications.  5. Hyperlipidemia. The patient's LDL was very elevated for a patient who has coronary artery disease, so she was started on simvastatin 20 mg. She will need repeat LFTs in 6 weeks.   DISCHARGE MEDICATIONS:  1. Propranolol 20 mg b.i.d.  2. Temazepam 15 mg at bedtime.  3. Aspirin 80 mg daily. 4. Synthroid 100 mcg daily.  5. Vitamin D2 50,000 international units weekly.  6. Remeron 15 mg at bedtime.  7. Nitroglycerin sublingual p.r.n. chest pain.  8. Simvastatin 20 mg  daily.  9. Ertapenem 1 gram q.24 hours, stop on June 9th.    DISCHARGE DIET: Low sodium.   DISCHARGE ACTIVITY: As tolerated.   DISCHARGE FOLLOWUP: The patient needs to follow up with an MD at the facility or Dr. Derrel Nip in 2 to 4 weeks.  SHe will need PT at rehab as well.   TIME SPENT: 35 minutes.   CONDITION: The patient is medically stable for discharge.   ____________________________ Jailani Hogans P. Benjie Karvonen, MD spm:lb D: 12/27/2013 12:06:02 ET T: 12/27/2013 12:32:08 ET JOB#: [id number]  cc: Charlies Rayburn P. Benjie Karvonen, MD, <Dictator> Deborra Medina, MD Donell Beers Cana Mignano MD ELECTRONICALLY SIGNED 12/28/2013 11:56

## 2014-11-21 NOTE — Consult Note (Signed)
General Aspect 81 year old female with history of CAD, PVD, hypertension, hyperlipidemia  presenting to [facility] ER, after 2 AM of nausea and vomiting.  Nausea and vomiting was preceded by taking hydrocodone the night before for sleep, since she was out of her sleep medication. The second morning, she was somewhat jittery and anxious when she woke up.  Was aware of chest discomfort and breathing heavy which resolved after she had an episode of nausea and vomiting.  However due to her symptoms the family brought her to the ER.  She has not had any further episodes of chest pain or nausea and vomiting.  She has been found to have a UTI.  Her cardiac enzymes are negative and EKG is negative.  There are plans in place for her to go home this afternoon with home health.  It does not appear that she needs any further cardiac evaluation and she will followup as already scheduled  in Cardiology in 2 weeks. her blood pressure was elevated around the 902I systolic on arrival to the ER.  The son states her usual blood pressures around 09-735 systolic.  It is in that range today.   Physical Exam:  GEN well developed, no acute distress   HEENT pink conjunctivae, hearing intact to voice, hard of hearing   NECK supple  thyroid not tender   RESP normal resp effort   CARD Regular rate and rhythm   ABD denies tenderness   EXTR negative edema   SKIN normal to palpation, skin turgor decreased   NEURO cranial nerves intact, motor/sensory function intact   PSYCH alert, A+O to time, place, person, good insight   Review of Systems:  Cardiovascular: Chest pain or discomfort  times one episode resolved after nausea and vomiting   Gastrointestinal: Nausea  x2 days in a row in the a.m. following hydrocodone for sleep   Genitourinary: positive for UTI   Review of Systems: All other systems were reviewed and found to be negative   Medications/Allergies Reviewed Medications/Allergies reviewed   Family & Social  History:  Family and Social History:  Family History Non-Contributory   Social History negative tobacco   Place of Living Home   Lab Results: Hepatic:  07-May-15 10:54   Bilirubin, Total 0.3  Alkaline Phosphatase 65 (45-117 NOTE: New Reference Range 06/20/13)  SGPT (ALT) 13  SGOT (AST) 30  Total Protein, Serum 6.8  Albumin, Serum  3.2  08-May-15 05:37   Bilirubin, Total 0.3  Alkaline Phosphatase 55 (45-117 NOTE: New Reference Range 06/20/13)  SGPT (ALT) 12  SGOT (AST) 18  Total Protein, Serum  5.9  Albumin, Serum  2.7  Routine Micro:  07-May-15 11:54   Micro Text Report BLOOD CULTURE   COMMENT                   NO GROWTH IN 18-24 HOURS   ANTIBIOTIC                       Micro Text Report BLOOD CULTURE   COMMENT                   NO GROWTH IN 18-24 HOURS   ANTIBIOTIC                       Culture Comment NO GROWTH IN 18-24 HOURS  Result(s) reported on 05 Dec 2013 at 12:03PM.  Culture Comment NO GROWTH IN 18-24 HOURS  Result(s) reported on  05 Dec 2013 at 12:03PM.    11:56   Organism Name Ruby ROD  Organism Quantity >100,000 CFU/ML  Micro Text Report URINE CULTURE   ORGANISM 1                >100,000 CFU/ML GRAM NEGATIVE ROD   COMMENT                   ID TO FOLLOW SENSITIVITIES TO FOLLOW   ANTIBIOTIC                       Specimen Source IN AND OUT CATH  Organism 1 >100,000 CFU/ML GRAM NEGATIVE ROD  Culture Comment ID TO FOLLOW SENSITIVITIES TO FOLLOW  Result(s) reported on 05 Dec 2013 at 08:49AM.  Routine Chem:  07-May-15 10:54   Glucose, Serum  122  BUN 8  Creatinine (comp) 1.01  Sodium, Serum  133  Potassium, Serum 4.3  Chloride, Serum 100  CO2, Serum 28  Calcium (Total), Serum 8.9  Osmolality (calc) 266  eGFR (African American)  56  eGFR (Non-African American)  49 (eGFR values <86m/min/1.73 m2 may be an indication of chronic kidney disease (CKD). Calculated eGFR is useful in patients with stable renal function. The eGFR calculation  will not be reliable in acutely ill patients when serum creatinine is changing rapidly. It is not useful in  patients on dialysis. The eGFR calculation may not be applicable to patients at the low and high extremes of body sizes, pregnant women, and vegetarians.)  Anion Gap  5  Lipase 97 (Result(s) reported on 04 Dec 2013 at 11:33AM.)  Result Comment POTASSIUM AST - Slight hemolysis, interpret results with  - caution.  Result(s) reported on 04 Dec 2013 at 11:13AM.  08-May-15 05:37   Glucose, Serum 84  BUN  5  Creatinine (comp) 1.05  Sodium, Serum 141  Potassium, Serum  3.4  Chloride, Serum  108  CO2, Serum 30  Calcium (Total), Serum  8.3  Osmolality (calc) 278  eGFR (African American)  54  eGFR (Non-African American)  46 (eGFR values <637mmin/1.73 m2 may be an indication of chronic kidney disease (CKD). Calculated eGFR is useful in patients with stable renal function. The eGFR calculation will not be reliable in acutely ill patients when serum creatinine is changing rapidly. It is not useful in  patients on dialysis. The eGFR calculation may not be applicable to patients at the low and high extremes of body sizes, pregnant women, and vegetarians.)  Anion Gap  3  Cardiac:  07-May-15 10:54   Troponin I < 0.02 (0.00-0.05 0.05 ng/mL or less: NEGATIVE  Repeat testing in 3-6 hrs  if clinically indicated. >0.05 ng/mL: POTENTIAL  MYOCARDIAL INJURY. Repeat  testing in 3-6 hrs if  clinically indicated. NOTE: An increase or decrease  of 30% or more on serial  testing suggests a  clinically important change)    15:13   Troponin I < 0.02 (0.00-0.05 0.05 ng/mL or less: NEGATIVE  Repeat testing in 3-6 hrs  if clinically indicated. >0.05 ng/mL: POTENTIAL  MYOCARDIAL INJURY. Repeat  testing in 3-6 hrs if  clinically indicated. NOTE: An increase or decrease  of 30% or more on serial  testing suggests a  clinically important change)    19:13   Troponin I < 0.02  (0.00-0.05 0.05 ng/mL or less: NEGATIVE  Repeat testing in 3-6 hrs  if clinically indicated. >0.05 ng/mL: POTENTIAL  MYOCARDIAL INJURY. Repeat  testing in 3-6 hrs if  clinically indicated. NOTE: An increase or decrease  of 30% or more on serial  testing suggests a  clinically important change)  Routine UA:  07-May-15 11:56   Color (UA) Yellow  Clarity (UA) Cloudy  Glucose (UA) Negative  Bilirubin (UA) Negative  Ketones (UA) Negative  Specific Gravity (UA) 1.009  Blood (UA) 1+  pH (UA) 6.0  Protein (UA) Negative  Nitrite (UA) Positive  Leukocyte Esterase (UA) 3+ (Result(s) reported on 04 Dec 2013 at 01:16PM.)  RBC (UA) 4 /HPF  WBC (UA) 211 /HPF  Bacteria (UA) TRACE  Epithelial Cells (UA) <1 /HPF  WBC Clump (UA) PRESENT (Result(s) reported on 04 Dec 2013 at 01:16PM.)  Routine Hem:  07-May-15 10:54   WBC (CBC) 8.6  RBC (CBC) 3.88  Hemoglobin (CBC)  11.2  Hematocrit (CBC)  34.0  Platelet Count (CBC) 288  MCV 88  MCH 28.9  MCHC 33.0  RDW  16.3  Neutrophil % 68.8  Lymphocyte % 20.5  Monocyte % 8.8  Eosinophil % 1.3  Basophil % 0.6  Neutrophil # 5.9  Lymphocyte # 1.8  Monocyte # 0.8  Eosinophil # 0.1  Basophil # 0.1 (Result(s) reported on 04 Dec 2013 at 11:13AM.)  08-May-15 05:37   WBC (CBC) 7.5  RBC (CBC)  3.54  Hemoglobin (CBC)  10.4  Hematocrit (CBC)  31.2  Platelet Count (CBC) 272  MCV 88  MCH 29.4  MCHC 33.4  RDW  16.1  Neutrophil % 46.7  Lymphocyte % 33.9  Monocyte % 15.8  Eosinophil % 2.9  Basophil % 0.7  Neutrophil # 3.5  Lymphocyte # 2.6  Monocyte #  1.2  Eosinophil # 0.2  Basophil # 0.1 (Result(s) reported on 05 Dec 2013 at [facility].)   Radiology Results: XRay:    07-May-15 11:25, Chest PA and Lateral  Chest PA and Lateral   REASON FOR EXAM:    weakness  COMMENTS:   May transport without cardiac monitor    PROCEDURE: DXR - DXR CHEST PA (OR AP) AND LATERAL  - Dec 04 2013 11:25AM     CLINICAL DATA:  Fall.    EXAM:  CHEST  2  VIEW    COMPARISON:  03/11/2013.    FINDINGS:  Mediastinum and hilar structures normal. Lungs are clear. Heart size  normal. No pleural effusion or pneumothorax. Diffuse osteopenia and  degenerative change.     IMPRESSION:  No acute abnormality.      Electronically Signed    By: Marcello Moores  Register    On: 12/04/2013 12:09         Verified By: Osa Craver, M.D., MD  CT:    07-May-15 12:51, CT Head Without Contrast  CT Head Without Contrast   REASON FOR EXAM:    nausea, vomiting, weakness  COMMENTS:       PROCEDURE: CT  - CT HEAD WITHOUT CONTRAST  - Dec 04 2013 12:51PM     CLINICAL DATA:  Status post fall 2 days ago.  Nausea and vomiting.    EXAM:  CT HEAD WITHOUT CONTRAST    TECHNIQUE:  Contiguous axial images were obtained from the base of the skull  through the vertex without intravenous contrast.    COMPARISON:  Head CT scan 03/28/2011.  FINDINGS:  Extensive hypoattenuation in the subcortical and periventricular  deep white matteris consistent with extensive chronic microvascular  ischemic change. The appearance is stable. Remote lacunar  infarctions right basal ganglia and right cerebellum are identified.  No evidence of  acute abnormality including infarct, hemorrhage, mass  lesion, mass effect, midline shift or abnormal extra-axial fluid  collection is identified. The calvarium is intact. The patient is  status post bilateral maxillary antrostomy.     IMPRESSION:  No acute abnormality.    Chronic microvascular ischemic change.  Electronically Signed    By: Inge Rise M.D.    On: 12/04/2013 13:13         Verified By: Ramond Dial, M.D.,    07-May-15 13:03, CT Abdomen and Pelvis With Contrast  CT Abdomen and Pelvis With Contrast   REASON FOR EXAM:    (1) ab pain; (2) ab pain  COMMENTS:       PROCEDURE: CT  - CT ABDOMEN / PELVIS  W  - Dec 04 2013  1:03PM     CLINICAL DATA:  Nausea and vomiting for 2 days.  Fall last  Tuesday.    EXAM:  CT ABDOMEN AND PELVIS WITH CONTRAST    TECHNIQUE:  Multidetector CT imaging of the abdomen and pelvis was performed  using the standard protocol following bolus administration of  intravenous contrast.  CONTRAST:  100 mL Isovue-300 IV    COMPARISON:  CT 01/28/2013, 04/27/2008 and pelvis/right hip films  12/04/2013    FINDINGS:  Lung bases are within normal. There is calcification of the mitral  valve annulus.    Abdominal images demonstrate several small liver cysts unchanged.  The spleen, pancreas and adrenal glands are within normal.  Gallbladder is unremarkable. Kidneys are normal in size and  otherwise unchanged. There is mild chronic stable prominence of the  intrarenal collecting systems and ureters. There is moderate  calcified plaque over the abdominal aorta with portable paucity of  the mid to distal aorta. Calcified plaque is present over the iliac  vessels. There is moderate diverticulosis of the colon without  active inflammation. The appendix is not seen. There is evidence of  patient's prior partial gastrectomy with gastrojejunostomy. There  are a few small nonspecific periaortic lymph nodes unchanged. There  is no free fluid. No evidence of bowel obstruction.    Pelvic images demonstrate no change in a 4.1 cm simple cystic  structure over the left posterior pelvis abutting the left lateral  wall of the rectum. Bladder is unremarkable.    There is moderate degenerative change of the spinal curvature of the  lumbar spine convex to the right. There are mild degenerative  changes of the hips. Three compression screws are present over the  right femoral neck intact and unchanged. There is no definite acute  right hip fracture as findings are unchanged from 01/28/2013.     IMPRESSION:  No acute findings in the abdomen/pelvis.    Degenerative change of the hips with postsurgical findings over the  right hip. No definite acute right hip  fracture.    Multiple small stable liver cysts.    Diverticulosis of the colon.    Mild stable chronic prominence of the intrarenal collecting systems  and ureters.  4.1 cm simple cystic structure over the left pelvis stable since  2009 and likely a benign process such as a peritoneal inclusion  cyst.    Gastrojejunostomy.      Electronically Signed    By: Marin Olp M.D.    On: 12/04/2013 13:25         Verified By: Pearletha Alfred, M.D.,    Sulfa: Hives  Penicillin: Hives  Cefotan: Unknown  Cephalosporins: Unknown  Nitrofurantoin: Unknown  Quinolones (fluoroquinolone) antibiotics: Unknown  Vital Signs/Nurse's Notes: **Vital Signs.:   08-May-15 05:03  Vital Signs Type Routine  Temperature Temperature (F) 98.1  Celsius 36.7  Temperature Source oral  Pulse Pulse 70  Respirations Respirations 18  Systolic BP Systolic BP 828  Diastolic BP (mmHg) Diastolic BP (mmHg) 64  Mean BP 82  Pulse Ox % Pulse Ox % 97  Pulse Ox Activity Level  At rest  Oxygen Delivery Room Air/ 21 %    14:34  Vital Signs Type Routine  Temperature Temperature (F) 97.5  Celsius 36.3  Temperature Source oral  Pulse Pulse 92  Respirations Respirations 18  Systolic BP Systolic BP 94  Diastolic BP (mmHg) Diastolic BP (mmHg) 52  Mean BP 66  Pulse Ox % Pulse Ox % 94  Pulse Ox Activity Level  At rest  Oxygen Delivery Room Air/ 21 %    Impression 81 year old female, with history of CAD and PVD hypertension hyperlipidemia,  admission to [facility] after an episode of chest pain that was resolved with nausea and vomiting probably related to use of hydrocodone and has been troponin negative with negative EKG and chest pain-free since hospitalization.   Plan 1.  Hospitalist plan is to discharge today with home health followup. 2.  Do not see any further need for cardiac workup.  Patient does already have an appointment set up in cardiology in 2 weeks and can follow up as an outpatient then.  Patient  was seen in collaboration with Dr. Nehemiah Massed.   Electronic Signatures: Roderic Palau (NP)  (Signed 08-May-15 14:57)  Authored: General Aspect/Present Illness, History and Physical Exam, Review of System, Family & Social History, Labs, Radiology, Allergies, Vital Signs/Nurse's Notes, Impression/Plan   Last Updated: 08-May-15 14:57 by Roderic Palau (NP)

## 2014-11-21 NOTE — Discharge Summary (Signed)
PATIENT NAME:  Evelyn [name], Evelyn [name] MR#:  [id number] DATE OF BIRTH:  12/18/1919  DATE OF ADMISSION:  12/04/2013 DATE OF DISCHARGE:  12/05/2013  DISCHARGE DIAGNOSES: 1. Transient episode of vomiting, likely due to narcotics. now resolved.  2. Escherichia coli urinary tract infection, multidrug resistant, sensitive to Augmentin. 3. Weakness, likely multifactorial. Set up for home health services.   SECONDARY DIAGNOSES: 1. History of recurrent urinary tract infection.  2. Lymphoma.  3. Arthritis.  4. Coronary artery disease status post stenting. 5. History of tremors. 6. Urinary incontinence.  7. Hypothyroidism.   CONSULTATIONS:  Urology, Denice Bors. Jacqlyn Larsen, MD  Cardiology, Corey Skains, MD   PROCEDURES AND RADIOLOGY:  1. Chest x-ray on the 7th of May showed no acute cardiopulmonary disease.  2. Right hip x-ray on the 7th of May showed possible progressive foreshortening of the right femoral neck status post pin fixation of a subcapital fracture. Recurrent fracture is difficult to exclude, although this could be positional.  3. CT scan of the head without contrast on the 7th of May showed no acute abnormality.  4. CT scan of the abdomen and pelvis without contrast on the 7th of May showed no acute findings in the abdomen and pelvis.   MAJOR LABORATORY PANEL: Urinalysis on admission showed trace bacteria, 211 WBCs, 3+ leukocyte esterase, and positive nitrites. WBCs in clumps seen. Urine culture grew more than 100,000 colonies of ESBL E. coli. Blood cultures x2 were negative.   HISTORY AND SHORT HOSPITAL COURSE: The patient is a 81 year old female with the above-mentioned medical problems, who was admitted for 2 episodes of vomiting, which were thought to be due to hydrocodone. Please see Dr. Keenan Bachelor dictated history and physical for further details. The patient also had some pyuria with possible urinary tract infection, although certainly this could have been colonization. Urology  consultation was obtained with Dr. Edrick Oh due to her history of recurrent UTI and known multi-drug resistant bladder infections. Per urine culture report from his office, the patient had E. coli growing in the urine and her antibiotic was switched to oral nitrofurantoin based on the culture and sensitivity as reported there. Cardiology consultation was obtained with Dr. Nehemiah Massed. Considering borderline troponin, this was thought to be due to supply/demand ischemia and the patient was discharged home in stable condition after evaluation by physical therapy with set up of home health services. Please note infectious disease consultation was not available at the time when she was admitted to the hospital, and after discussion with family (son), decision was made to discharge her home with outpatient follow-up with infectious disease for consideration of specific antibiotic for her recurrent UTI for possibly prevention and/or treatment in the long term course over the hospitalization. Again, this certainly could be colonization. She was discharged on 8th of May in stable condition.   PERTINENT PHYSICAL EXAMINATION: On the date of discharge:  VITAL SIGNS: On the date of discharge, her vital signs were as follows: Temperature 97.5, heart rate 92 per minute, respirations 18 per minute, blood pressure 118/64. She was saturating 97% on room air.  CARDIOVASCULAR: S1, S2 normal. No murmurs, rubs, or gallop.  LUNGS: Clear to auscultation bilaterally. No wheezing, rales, rhonchi, or crepitation.  ABDOMEN: Soft, benign.  NEUROLOGIC: Nonfocal examination.  All of the physical examination remained at baseline.   DISCHARGE MEDICATIONS: 1. Propranolol 20 mg p.o. b.i.d.  2. Temazepam 15 mg p.o. at bedtime.  3. Aspirin 81 mg p.o. at bedtime.  4. Levothyroxine 100 mcg p.o. daily.  5. Mirtazapine 7.5 mg p.o. at bedtime.  6. Vitamin D2 at 50,000 international units once a week orally.  7. Acetaminophen/hydrocodone  325/5 mg 1 tablet p.o. every 6 hours as needed.  8. Augmentin 1 tablet p.o. b.i.d. for 10 days.   DISCHARGE DIET: Low sodium, low fat, low cholesterol.   DISCHARGE ACTIVITY: As tolerated.   DISCHARGE INSTRUCTIONS AND FOLLOW-UP: The patient was instructed to follow up with her primary care physician, Dr. Deborra Medina, as scheduled in 2 to 3 weeks. She was instructed to follow up with infectious disease, Dr. Adrian Prows, in 1 to 2 weeks; with Dr. Edrick Oh, in 2 to 4 weeks and [facility] cardiology in 4 to 6 weeks. She was set up to get home health services and physical therapy. Please note, she was discharged on Augmentin for culture and sensitivity obtained and recommendations per urology. She does have multi-drug resistance seen on the urine culture and sensitivity, but there is a good possibly that this all could be just colonization as she was not symptomatic. She was not running any fever. She remains at very high risk for recurrent hospitalizations.  TOTAL TIME SPENT: 55 minutes.     ____________________________ Lucina Mellow. Manuella Ghazi, MD vss:lm D: 12/07/2013 15:14:21 ET T: 12/08/2013 01:23:17 ET JOB#: [id number]  cc: Bertin Inabinet S. Manuella Ghazi, MD, <Dictator> Denice Bors. Jacqlyn Larsen, MD Deborra Medina, MD Corey Skains, MD  Remer Macho MD ELECTRONICALLY SIGNED 12/08/2013 10:54

## 2014-11-21 NOTE — H&P (Signed)
PATIENT NAME:  Evelyn [name], Evelyn [name] MR#:  [id number] DATE OF BIRTH:  1920-01-29  DATE OF ADMISSION:  12/26/2013  ADMITTING PHYSICIAN: Gladstone Lighter, MD  PRIMARY CARE PHYSICIAN: Deborra Medina, MD  CHIEF COMPLAINT: Chest pain and dyspnea on exertion.   HISTORY OF PRESENT ILLNESS: Ms. Eugene is an elderly 81 year old Caucasian female with past medical history significant for multiple urinary tract infections, history of lymphoma currently in remission, coronary artery disease status post prior stent, history of tremors, and urinary incontinence who comes to the hospital secondary to above-mentioned complaints. The patient had a recent admission 3 weeks ago for UTI. Cultures were growing E. coli. She was discharged on Augmentin, but culture sensitivities came back resistant to all the drugs, except  imipenem and gentamicin. The patient has been feeling weak for the last couple of days. Yesterday she noticed that when she got up she had severe chest tightness that resolved, but when she woke up again this morning she was trying to get out of bed and take a few steps to use the bathroom. All of a sudden she felt she could not breathe, she had severe chest tightness that lasted for several minutes, and EMS was called. The patient has not had any chest pains like this in the recent past, although she had some pain in the last admission. However, that was related to constant nausea and vomiting from her UTI. The patient follows with Dr. Nehemiah Massed, had a visit with him about 2 weeks ago at which time she was doing fine. She has not had any recent stress test. She felt nauseous during that episode and was not diaphoretic and the pain was not radiating anywhere. She is nontender to touch in the midsternal region and it is not positional.   PAST MEDICAL HISTORY: 1.  Multiple urinary tract infections.  2.  Lymphoma, in remission.  3.  Coronary artery disease status post prior stents.  4.  Urinary  incontinence. 5.  Depression and anxiety.   PAST SURGICAL HISTORY: 1.  Hysterectomy.  2.  Partial gastrectomy for bleeding gastric ulcers. 3.  Cardiac stent placement. 4.  Back surgery.   ALLERGIES TO MEDICATIONS: THE PATIENT IS ALLERGIC TO MULTIPLE ANTIBIOTICS INCLUDING CEFOTETAN, CEPHALOSPORINS, AND PENICILLINS, BUT SHE TOLERATES AMOXICILLIN OKAY, NITROFURANTOIN AND  QUINOLONES, BUT THE PATIENT HAS TAKEN CIPRO BEFORE, SULFA.   CURRENT HOME MEDICATIONS: 1.  Aspirin 81 mg p.o. daily.  2.  Levothyroxine 100 mcg p.o. daily.  3.  Mirtazapine 15 mg p.o. at bedtime.  4.  Propranolol 20 mg p.o. b.i.d.  5.  Temazepam 15 mg p.o. at bedtime.  6.  Vitamin D 250,000 international units p.o. weekly.   SOCIAL HISTORY: Lives at home by herself. Son who lives close by checks on her. No history of any smoking or alcohol use currently.   FAMILY HISTORY: Not significant at this time.   REVIEW OF SYSTEMS:  CONSTITUTIONAL: No fever, fatigue or weakness.  EYES: No blurred vision, double vision, inflammation or glaucoma. Uses reading glasses.  ENT: No tinnitus, ear pain. Positive for hearing loss and wears hearing aids. No epistaxis, discharge or snoring.  RESPIRATORY: No cough, wheeze, hemoptysis or COPD. CARDIOVASCULAR: Positive for chest tightness and also dyspnea on exertion. No palpitations or syncope. GASTROINTESTINAL: No nausea, vomiting, diarrhea, abdominal pain, hematemesis, or melena.  GENITOURINARY: No dysuria, hematochezia, renal calculus, frequency or incontinence.  ENDOCRINE: No polyuria, nocturia, thyroid problems, heat or cold intolerance.  HEMATOLOGY: No anemia, easy bruising or bleeding.  SKIN: No acne,  rash or lesions.  MUSCULOSKELETAL: Positive for arthritis. No gout. No neck, back, or shoulder pain at this time.  NEUROLOGIC: No numbness, weakness, CVA, TIA or seizures.  PSYCHOLOGICAL: No anxiety, insomnia, depression.   PHYSICAL EXAMINATION: VITAL SIGNS: Temperature 98.1 degrees  Fahrenheit, pulse 104, respirations 18, blood pressure 140/75, pulse ox 99% on room air.  GENERAL: Well-built, well-nourished female lying in bed, not in any acute distress.  HEENT: Normocephalic, atraumatic. Pupils equal, round, and reacting to light. Anicteric sclerae. Extraocular movements intact. Oropharynx clear without erythema, mass or exudates.  NECK: Supple. No thyromegaly, JVD or carotid bruits. No lymphadenopathy.  LUNGS: Moving air bilaterally. No wheeze or crackles. No use of accessory muscles for breathing.  CARDIOVASCULAR: S1 and S2 rapid rate and regular rhythm. No murmurs, rubs, or gallops.  ABDOMEN: Soft, nontender, nondistended. No hepatosplenomegaly. Normal bowel sounds.  EXTREMITIES: No pedal edema. No clubbing or cyanosis. 2+ dorsalis pedis pulses bilaterally.  SKIN: No acne, rash or lesions.  LYMPHATICS: No cervical or inguinal lymphadenopathy.  NEUROLOGIC: Cranial nerves are intact. No focal motor or sensory deficits. PSYCHIATRIC: The patient is awake, alert, and oriented x3.   DIAGNOSTIC DATA: WBC 11, hemoglobin 11.8, hematocrit 36.3, platelet count 381,000.   Sodium 140, potassium 3.8, chloride 106, bicarb 31, BUN 13, creatinine 1, glucose 101 and calcium 8.7. Urinalysis was nitrate positive, 3+ leukocyte esterase, more than 300 WBCs, and 3+ bacteria seen.  Chest x-ray showing clear lung fields, hyperinflation. No acute cardiopulmonary disease.   BNP is 1305.   EKG is showing accelerated junctional rhythm, heart rate of 106. No acute ST-T wave abnormalities noted.  ASSESSMENT AND PLAN: This is a 81 year old female with past medical history significant for coronary artery disease status post stents, lymphoma in remission, and multiple urinary tract infections admitted for chest pain and dyspnea.  1.  Unstable angina with chest tightness and dyspnea, worsened with minimal exertion. Because of her cardiac history we will admit her to telemetry, recycle cardiac markers,  get cardiology consultation by Dr. Nehemiah Massed and order a Myoview for tomorrow morning. Meanwhile continue aspirin, propranolol. Check lipid profile. Add a statin and p.r.n. nitroglycerin.  2.  Urinary tract infection, multidrug resistant urinary tract infection. At her last admission 3 weeks ago she had Escherichia coli which was resistant to all the antibiotics except a couple; however, she was discharged on Augmentin. Her urinalysis looks very impressive and due to her recent weakness we can consider her failing outpatient treatment and will place on IV Invanz and resend urine cultures. Although the patient has penicillin allergy, she tolerates ampicillin and amoxicillin and Augmentin very well. 3.  Coronary artery disease. As mentioned above, get a stress test and continue medical management.  4.  Depression and anxiety and insomnia. Continue her home medications.   CODE STATUS: FULL.  TIME SPENT ON ADMISSION: 50 minutes. ____________________________ Gladstone Lighter, MD rk:sb D: 12/26/2013 14:36:33 ET T: 12/26/2013 15:54:29 ET JOB#: [id number]  cc: Gladstone Lighter, MD, <Dictator> Corey Skains, MD Deborra Medina, MD Gladstone Lighter MD ELECTRONICALLY SIGNED 01/10/2014 14:06

## 2014-11-21 NOTE — H&P (Signed)
PATIENT NAME:  Evelyn [name], Evelyn [name] MR#:  [id number] DATE OF BIRTH:  [birth year]  DATE OF ADMISSION:  12/04/2013  PRIMARY CARE PHYSICIAN: Deborra Medina, MD  HISTORY OF PRESENT ILLNESS: This is a 81 year old Caucasian female with past medical history significant for history of lymphoma, history of arthritis, also admission for SIRS and Klebsiella urinary tract infection in August 2014 who presented back to the hospital with complaints of 2 episodes of vomiting over the past 2 days. According to the patient's son who is present during my interview, the patient did not feel well, was weak, was brought to Dr. Bjorn Loser office a few days ago. Urinalysis was tested and cultures were sent for; however, since the patient was complaining of some pain she was given hydrocodone take home for pain. Apparently she took hydrocodone each evening she went to the bed and over the past 2 days, in the morning, she developed 2 episodes of nausea and vomiting. Today in the morning the patient was complaining of some chest tightness. She describes tightness in the lower part of the sternum. The patient's tightness lasted approximately 30 minutes and had no radiation. There were no alleviating or aggravating factors. The patient vomited just after that chest tightness episode and her pain somewhat resolved. According to the patient's family, the patient has been very weak for the past 1-1/2 weeks. She fell down 1-1/2 weeks ago backwards while undressing herself and fell down on the right hip area and has been having problems moving, ambulating even with a walker, which she usually does because of significant right hip pain. Here in the hospital she underwent CT scan of her abdomen and pelvis including hips, which showed no fracture, also no significant abnormalities in her abdomen. However, the patient was noted to be somewhat dehydrated with low sodium levels as well as dry mucosa and hospitalist services were contacted for admission for  observation.   PAST MEDICAL HISTORY: Significant for history of admission for SIRS Klebsiella urinary tract infection in August 2014, history of lymphoma, arthritis, coronary artery disease status post stent placement, history of tremors, urinary incontinence, recurrent urinary tract infections followed by Dr. Jacqlyn Larsen, history of hypothyroidism.  ALLERGIES: Multiple: CEPHALOSPORIN, CIPROFLOXACIN, NITROFURANTOIN, PENICILLIN, QUINOLONES. The patient does not recall what exactly happened.  SOCIAL HISTORY: Used to be a smoker, quit many years ago. No alcohol abuse. No illicit drug abuse. Lives with her husband. Son lives approximately a mile away.   FAMILY HISTORY: The patient's both mother and father were deceased from complications to stroke.   REVIEW OF SYSTEMS: Difficult to obtain, however, the patient admits of having some chest pains earlier today, some nausea and vomiting episodes, some periumbilical abdominal pains. Not able to provide much more history. Admits of having intermittent constipation.  CONSTITUTIONAL: Denies any fevers, chills, fatigue, weakness, pains or weight loss or gain. According to the patient's son, the patient had to turn on heater yesterday because she was chilly. EYES: No blurry vision, double vision, glaucoma or cataracts.  ENT: Denies any tinnitus, allergies, epistaxis, dentures, difficulty swallowing. RESPIRATORY: Denies any cough, wheezes, asthma, COPD. CARDIOVASCULAR: Denies any chest pains, orthopnea, edema, arrhythmias, palpitations or syncope recently.  GASTROINTESTINAL: Denies any vomiting or nausea now. No hematemesis, rectal bleeding, change in bowel habits.  GENITOURINARY: Denies dysuria, hematuria, frequency, incontinence.  ENDOCRINOLOGY: Denies any polydipsia, nocturia, thyroid problems, heat or cold intolerance or thirst. HEMATOLOGIC: Denies any anemia, easy bruising, bleeding, swollen glands.  SKIN: Denies any acne, rashes, lesions or change in moles.  MUSCULOSKELETAL: Denies arthritis, cramps, or swelling. NEUROLOGIC: Denies epilepsy or tremor. The patient does have chronic right lower extremity numbness. She does have back pains. She had back surgery in 2008. After that back surgery she fell down and she has been having numbness in the right leg ever since. No history of strokes.  PSYCH: No anxiety or depression noted.  PHYSICAL EXAMINATION: VITAL SIGNS: On arrival to the hospital, the patient's temperature was 98.5, pulse 80, respiratory rate 18, blood pressure 97/87, and saturation was 96% on room air.  GENERAL: This is a well-developed, well-nourished, thin Caucasian female in no significant distress, lying on the stretcher. EYES: Pupils are equal and reactive to light. Extraocular movements intact. No icterus or conjunctivitis. Has normal hearing. No pharyngeal erythema. Mucosa is very dry.  NECK: No masses. Supple and nontender. Thyroid not enlarged. No adenopathy. No JVD or carotid bruits bilateral. Full range of motion.  LUNGS: Clear to auscultation in all fields. No rales, rhonchi, diminished breath sounds, or labored inspirations, dullness to percussion or over respiratory distress.   CARDIOVASCULAR: S1 and S2 appreciated. No murmurs, gallops or rubs noted, distant. PMI not lateralized. Chest is nontender to palpation. 1+ pedal pulses. No lower extremity edema, calf tenderness or cyanosis was noted.  ABDOMEN: Soft. No significant tenderness.  No hepatosplenomegaly or masses were noted.  RECTAL: Deferred.  MUSCLE STRENGTH: Able to move all extremities. Diminished strength in the right lower extremity. No cyanosis, degenerative joint disease, or kyphosis. Gait was not tested. SKIN: Did not reveal any rashes, lesions, erythema, nodularity or induration. It was warm and dry to palpation.  LYMPHATIC: No adenopathy in the cervical region.  NEUROLOGIC: Cranial nerves grossly intact. Sensory is intact. No dysarthria or aphasia. The patient is  alert and oriented to person and place, cooperative. Memory is minimally impaired. No significant confusion, agitation, or depression was noted.   DIAGNOSTIC DATA: Glucose 122 and sodium 133, otherwise BMP was unremarkable. Liver enzymes: Albumin level of 3.2, otherwise unremarkable. Troponin is less than 0.02. White blood cell count 8.6, hemoglobin 11.2, platelet count 288,000. Normal absolute neutrophil count at 5.9.   Urinalysis: Yellow cloudy urine, negative for glucose, bilirubin or ketones. Specific gravity 1.009. PH was 6, 1+ blood, negative for protein, positive for nitrites, 3+ leukocyte esterase, 4 red blood cells, 211 white blood cells, trace bacteria, less than 1 epithelial cell as well as white blood cell clumps were present.   Chest x-ray, PA and lateral, 7th of May 2015, revealed no acute abnormality.   Right hip complete x-ray, 7th of May 2015, revealed possible progressive foreshortening of the right femoral neck status post no spin fixation of a subcapital fracture. A recurrent fracture is difficult to exclude, although this difference may be positional. Consider CT scan for further evaluation.   CT scan of abdomen and pelvis with contrast, 7th of May 2015, revealed no acute findings in the abdomen and pelvis, degenerative changes in the hips with postsurgical findings over the right hip. No definite acute right hip fracture. Multiple small stable liver cysts. Diverticulosis of the colon. Mild stable chronic prominence of the infrarenal collecting systems and ureters. A 4.1 cm simple cyst structure over the left pelvis, stable since 2009, and likely a benign process such as peritoneal inclusion cyst.  Gastrojejunostomy also was noted.   ASSESSMENT AND PLAN: 1.  Two episodes of vomiting in the past 2 mornings after taking hydrocodone. It could be related to this medication; however, I cannot rule out any other reason  such as gastritis or peptic ulcer disease to be present. Will admit  the patient to the medical floor. Will hold hydrocodone. We will start the patient on clear liquid diet and will advance to regular as tolerated. Will also continue the patient on proton pump inhibitors.  2.  Pyuria, suspected urinary tract infection. We will start the patient on Cipro IV. Will follow cultures.  3.  Weakness, falls, right hip pain. No fracture. We will get physical therapist involved. 4.  Dehydration. We will continue the patient on IV fluids.  5.  Hyponatremia. We will continue IV fluids. We will follow the patient's sodium level in the morning.  6.  Malignant hypertension, possibly stress related. We will continue her outpatient medications and advance them if needed.  7.  Chest pain, possibly related to elevated blood pressure. Check cardiac enzymes x3. Will initiate the patient on beta blockers, nitroglycerin, and aspirin as well as heparin subcutaneous.   TIME SPENT: 50 minutes.  ____________________________ Theodoro Grist, MD rv:sb D: 12/04/2013 14:49:51 ET T: 12/04/2013 16:12:41 ET JOB#: [id number]  cc: Theodoro Grist, MD, <Dictator> Deborra Medina, MD Carmichaels MD ELECTRONICALLY SIGNED 01/01/2014 19:52

## 2014-11-21 NOTE — H&P (Signed)
PATIENT NAME:  Evelyn [name], Evelyn [name] MR#:  [id number] DATE OF BIRTH:  Jan 20, 1920  DATE OF ADMISSION:  12/04/2013  ADDENDUM  MEDICATION LIST:  Acetaminophen/hydrocodone 325/5 mg 1 tablet every six hours as needed, aspirin 81 mg a day, levothyroxine 100 mcg p.o. daily, Remeron 7.5 mg p.o. at bedtime, propranolol 10 mg twice daily, temazepam 15 mg once at bedtime, and vitamin D2, 50,000 units once a week.   ____________________________ Theodoro Grist, MD rv:rp D: 12/04/2013 14:50:52 ET T: 12/04/2013 16:27:23 ET JOB#: [id number]  cc: Theodoro Grist, MD, <Dictator> Prynce Jacober MD ELECTRONICALLY SIGNED 01/01/2014 19:52

## 2014-11-21 NOTE — Discharge Summary (Signed)
PATIENT NAME:  Evelyn [name], Evelyn [name] MR#:  [id number] DATE OF BIRTH:  1920/02/04  DATE OF ADMISSION:  12/26/2013 DATE OF DISCHARGE:  12/28/2013  ADDENDUM   The patient was actually discharged 12/28/2013. She was kept in the hospital as she had some issues with the PICC line. She has a midline catheter placed. For her IV antibiotics, she will stop these antibiotics on 06/09 and she will need physical therapy while at rehab.  ____________________________ Maeleigh Buschman P. Benjie Karvonen, MD spm:sg D: 12/28/2013 11:29:00 ET T: 12/28/2013 12:02:05 ET JOB#: [id number]  cc: Ahnika Hannibal P. Benjie Karvonen, MD, <Dictator> Katrece Roediger P Abron Neddo MD ELECTRONICALLY SIGNED 12/29/2013 12:12

## 2014-11-21 NOTE — Consult Note (Signed)
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  She has been referred to infectious disease but did not follow through with recommended treatment.  This has been an ongoing issue.  She keeps getting antibiotics for treatment that are not beneficial.  She was recently treated with a course of antibiotics for a slight variation in her culture.  Her follow-up urine culture just returned.  It is attached below.  She has multiple antibiotics sensitivities and not true allergies.  She likes to be treated with Cipro.  This is not an appropriate antibiotic.  I have utilized Augmentin in the past.  This is the only oral antibiotic with sensitivities.  She really needs infectious disease input and treatment.  There is nothing else that can be offered from a urological perspective.  If there are any questions, please feel free to contact us.  This has been an ongoing chronic problem with no good resolution.  Urine Culture, Comprehensive Final report (A)  Result 1 Escherichia coli (A)  Comments: 50,000-100,000 colony forming units per mL  Antimicrobial Susceptibility Comment  Comments:  ***** S = Susceptible; I = Intermediate; R = Resistant ***** P = Positive; N = Negative MICS are expressed in micrograms per mL Antibiotic RSLT#1 RSLT#2 RSLT#3 RSLT#4 Amoxicillin/Clavulanic Acid S Ampicillin R Cefazolin R Cefepime R Ceftriaxone R Cefuroxime R Cephalothin R Ciprofloxacin R Ertapenem S Gentamicin S Imipenem S Levofloxacin R Nitrofurantoin R Piperacillin R Tetracycline R Tobramycin S Trimethoprim/Sulfa R     Electronic Signatures: Murrell Redden (MD)  (Signed on 07-May-15 21:47)  Authored  Last Updated: 07-May-15 21:47 by Murrell Redden (MD)

## 2014-11-21 NOTE — Consult Note (Signed)
PATIENT NAME:  Evelyn [name], Evelyn [name] MR#:  [id number] DATE OF BIRTH:  1920-01-26  DATE OF CONSULTATION:  12/26/2013  CONSULTING PHYSICIAN:  Isaias Cowman, MD  PRIMARY CARE PHYSICIAN: Dr. Derrel Nip.  CARDIOLOGIST:  Dr. Nehemiah Massed.   CHIEF COMPLAINT: Chest pain.   HISTORY OF PRESENT ILLNESS: The patient is a 81 year old female with known history of coronary artery disease, status post prior coronary stent. The patient was recently hospitalized 3 weeks ago with urinary tract infection. On 12/25/2013, the patient awoke experiencing substernal chest discomfort. The patient presented today to [facility] Emergency Room where EKG was nondiagnostic and initial troponin is negative.   PAST MEDICAL HISTORY: 1.  Coronary artery disease, status post prior coronary artery stent.  2.  Multiple urinary tract infections.  3.  History of lymphoma in remission.  4.  Urinary incontinence.   MEDICATIONS: Aspirin 81 mg daily, propranolol 20 mg b.i.d., levothyroxine 100 mcg daily, mirtazapine 15 mg at bedtime, temazepam 50 mg at bedtime, vitamin D 250,000 International Units p.o. weekly.   SOCIAL HISTORY: The patient currently lives alone. She has a remote tobacco abuse history.   FAMILY HISTORY: No immediate family history of coronary artery disease or myocardial infarction.    REVIEW OF SYSTEMS:   CONSTITUTIONAL: No fever or chills.  EYES: No blurry vision.  EARS: No hearing loss.  RESPIRATORY: No shortness of breath.  CARDIOVASCULAR: Chest pain as described above.  GASTROINTESTINAL: No nausea, vomiting or diarrhea.  GENITOURINARY: No dysuria or hematuria.  ENDOCRINE: No polyuria or polydipsia.  MUSCULOSKELETAL: No arthralgias or myalgias.  NEUROLOGICAL: No focal muscle weakness or numbness.  PSYCHOLOGICAL: No depression or anxiety.   PHYSICAL EXAMINATION: VITAL SIGNS: Blood pressure 135/84, pulse 106, respirations 20, temperature 98.2, pulse ox 98%.  HEENT: Pupils equal, reactive to light and  accommodation.  NECK: Supple without thyromegaly.  LUNGS: Clear.  HEART: Normal JVP. Normal PMI. Regular rate and rhythm. Normal S1, S2. No appreciable gallop, murmur or rub.  ABDOMEN: Soft and nontender. Pulses were intact bilaterally.  MUSCULOSKELETAL: Normal muscle tone.  NEUROLOGIC: The patient is alert and oriented x 3. Motor and sensory are both grossly intact.   IMPRESSION: A 81 year old female with history of known coronary artery disease, status post prior coronary stent, who presents with chest pain, nondiagnostic EKG and negative troponin.   RECOMMENDATIONS: 1.  Agree with overall current therapy.  2.  Would defer full dose anticoagulation.  3.  Agree with proceeding with Lexiscan sestamibi study in a.m.    ____________________________ Isaias Cowman, MD ap:dmm D: 12/26/2013 17:24:00 ET T: 12/26/2013 21:14:23 ET JOB#: [id number]  cc: Isaias Cowman, MD, <Dictator> Isaias Cowman MD ELECTRONICALLY SIGNED 12/29/2013 15:05

## 2014-11-22 NOTE — Op Note (Signed)
PATIENT NAME:  Evelyn [name], Evelyn [name] MR#:  [id number] DATE OF BIRTH:  07/28/1920  DATE OF PROCEDURE:  10/31/2011  PREOPERATIVE DIAGNOSES:  1. Detrusor instability.  2. Urinary frequency.  3. Urge incontinence.   POSTOPERATIVE DIAGNOSES:  1. Detrusor instability.  2. Urinary frequency.  3. Urge incontinence.   PROCEDURES: Cystoscopy and Botox injection.   SURGEON: Edrick Oh, M.D.   ANESTHESIA: MAC anesthesia.   INDICATIONS: The patient is an 81 year old white female with a long history of detrusor instability, urinary frequency and urge incontinence. She has been tried on multiple medical managements as well as conservative measures and other treatments through many years. She has failed all prior attempts. She presents for Botox injection.   DESCRIPTION OF PROCEDURE: After informed consent was obtained, the patient was taken to the operating room and placed in the dorsal lithotomy position under MAC anesthesia. The patient was then prepped and draped in the standard fashion. The 72 French rigid cystoscope was introduced into the urinary bladder. The bladder was drained of urine. Visualization of the bladder demonstrated no significant mucosal abnormalities. Prominent hypervascularity was noted consistent with the patient's known radiation cystitis. Bilateral ureteral orifices were well visualized with no lesions noted. Some squamous metaplasia was noted along the trigone region. The ureteral orifices were noted to be slightly lateral consistent with contraction of the bladder related to her previous radiation therapy. The Botox was prepared in the standard fashion with 200 units in 1 mL increments. These were injected in five rows of six across the posterior bladder wall. No significant bleeding was encountered. Upon completion, the bladder was emptied. The patient was returned to the supine position. She was awakened from anesthesia. She was taken to the recovery room in stable condition. There  were no problems or complications. The patient tolerated the procedure well. ____________________________ Denice Bors. Jacqlyn Larsen, MD bsc:slb D: 10/31/2011 13:07:09 ET T: 10/31/2011 14:50:42 ET JOB#: [id number]  cc: Denice Bors. Jacqlyn Larsen, MD, <Dictator> Denice Bors Frederik Standley MD ELECTRONICALLY SIGNED 10/31/2011 15:36

## 2014-11-26 ENCOUNTER — Ambulatory Visit (INDEPENDENT_AMBULATORY_CARE_PROVIDER_SITE_OTHER): Payer: PPO | Admitting: Podiatry

## 2014-11-26 ENCOUNTER — Ambulatory Visit: Payer: PPO

## 2014-11-26 DIAGNOSIS — B351 Tinea unguium: Secondary | ICD-10-CM | POA: Diagnosis not present

## 2014-11-26 DIAGNOSIS — L84 Corns and callosities: Secondary | ICD-10-CM

## 2014-11-26 DIAGNOSIS — M79676 Pain in unspecified toe(s): Secondary | ICD-10-CM

## 2014-11-26 NOTE — Progress Notes (Signed)
Patient ID: Evelyn [name], female   DOB: February 10, 1920, 81 y.o.   MRN: [id number]  Subjective: 81 y.o.-year-old female returns the office today for painful, elongated, thickened toenails which she is unable to trim herslef. Denies any redness or drainage around the nails. She also states she continues to have pain along the left 5th toe over the site of a corn. She is upset because the corn keeps coming back despite her multiple visits. Denies any acute changes since last appointment and no new complaints today. Denies any systemic complaints such as fevers, chills, nausea, vomiting.   Objective: AAO 3, NAD DP/PT pulses palpable, CRT less than 3 seconds Protective sensation intact with Simms Weinstein monofilament, Achilles tendon reflex intact.  Nails hypertrophic, dystrophic, elongated, brittle, discolored 10. There is tenderness overlying these nails. There is no surrounding erythema or drainage along the nail sites. Mild corn over the dorsal lateral aspect of the left 5th digit. There is underlying adductovarus and hammertoe deformities.  No open lesions or otherpre-ulcerative lesions are identified. No other areas of tenderness bilateral lower extremities. No overlying edema, erythema, increased warmth. No pain with calf compression, swelling, warmth, erythema.  Assessment: Patient presents with symptomatic onychomycosis; left 5th digit corn  Plan: -Treatment options including alternatives, risks, complications were discussed -Nails sharply debrided 10 without complication/bleeding. -50 Corner sharply debrided without complication/cleaning. I again discussed with the patient detail likely etiology for 1. Discussed that without taking the pressure off the area 20 keep reoccurring. I dispensed multiple offloading pads to help take the pressure off as well as discussed wide shoe gear. -Discussed daily foot inspection. If there are any changes, to call the office immediately.  -Follow-up  in 3 months or sooner if any problems are to arise. In the meantime, encouraged to call the office with any questions, concerns, changes symptoms.

## 2014-12-28 ENCOUNTER — Other Ambulatory Visit: Payer: Self-pay | Admitting: Internal Medicine

## 2015-01-19 ENCOUNTER — Other Ambulatory Visit: Payer: Self-pay | Admitting: Internal Medicine

## 2015-01-19 ENCOUNTER — Ambulatory Visit
Admission: RE | Admit: 2015-01-19 | Discharge: 2015-01-19 | Disposition: A | Discharge status: Home / Self Care | Payer: PPO | Source: Ambulatory Visit | Attending: Internal Medicine | Admitting: Internal Medicine

## 2015-01-19 DIAGNOSIS — M4186 Other forms of scoliosis, lumbar region: Secondary | ICD-10-CM | POA: Insufficient documentation

## 2015-01-19 DIAGNOSIS — M25551 Pain in right hip: Secondary | ICD-10-CM

## 2015-01-19 DIAGNOSIS — M8580 Other specified disorders of bone density and structure, unspecified site: Secondary | ICD-10-CM | POA: Insufficient documentation

## 2015-01-19 DIAGNOSIS — M5136 Other intervertebral disc degeneration, lumbar region: Secondary | ICD-10-CM | POA: Diagnosis not present

## 2015-01-21 ENCOUNTER — Ambulatory Visit (INDEPENDENT_AMBULATORY_CARE_PROVIDER_SITE_OTHER): Payer: PPO

## 2015-01-21 ENCOUNTER — Telehealth: Payer: Self-pay | Admitting: *Deleted

## 2015-01-21 ENCOUNTER — Ambulatory Visit (INDEPENDENT_AMBULATORY_CARE_PROVIDER_SITE_OTHER): Payer: PPO | Admitting: Podiatry

## 2015-01-21 DIAGNOSIS — L84 Corns and callosities: Secondary | ICD-10-CM

## 2015-01-21 DIAGNOSIS — M25571 Pain in right ankle and joints of right foot: Secondary | ICD-10-CM | POA: Diagnosis not present

## 2015-01-21 DIAGNOSIS — M779 Enthesopathy, unspecified: Secondary | ICD-10-CM

## 2015-01-21 DIAGNOSIS — M79671 Pain in right foot: Secondary | ICD-10-CM

## 2015-01-21 NOTE — Progress Notes (Signed)
Patient ID: Evelyn [name], female   DOB: 01-03-1920, 81 y.o.   MRN: [id number]  Subjective: 81 year old female presents the office today for reoccurrence of pain to a corn on her left fifth toe. She states that she does not wear the pads to help offload the area. She states the corn reoccurs causing pain. She also states that over the last couple days she has started to have pain to the outside part of the right foot as well as of the right leg. She is holding behind her leg proximal to the knee where she is having majority of her pain. She also states that she's had some pain to the outside part of her foot over the last couple days as well. She denies any history of injury or trauma. She denies any change or increase activity of the time of onset of symptoms. She states that she did go to the emergency room yesterday for this as well. No other complaints at this time in no acute changes otherwise since last appointment.  Objective: Awake, alert, NAD DP/PT pulses palpable, CRT less than 3 seconds Protective sensation is decreased with Derrel Nip monofilament which is not new. There is a hyperkeratotic lesion on the dorsal lateral aspect of the left fifth toe. Upon debridement lesion no underlying ulceration, drainage or other clinical signs of infection. There is hammertoe contractures identified bilaterally. There is mild tenderness to palpation along the lateral aspect of the foot proximal to the fifth metatarsal base along the course of the peroneal tendon inferior to the lateral malleolus. There is mild discomfort on the anterior lateral aspect of the ankle joint. There is no areas of pinpoint bony tenderness or pain the vibratory sensation overlying the fibula, tibia , fifth metatarsal base or other areas of the foot/ankle. There is mild edema along the  Lateral aspect of the foot however there is no swelling to the ankle. There is no associated erythema or increase in warmth. No other areas  of tenderness to bilateral lower extremities. Subjectively she is holding behind her knees and she has sharp pains mostly to her leg above her knee. No other open lesions or pre-ulcer lesions identified bilaterally. No pain with calf compression, swelling, warmth, erythema.  Assessment:  81 year old female with left 5th digit corn; right lateral foot pain  Plan: -X-rays were obtained and reviewed with the patient.  There is no definitive fracture identified at this time on x-ray. On the lateral view of the ankle there was a question area on the fibula how this area was identified on either the AP or the oblique view. There is also no history of trauma/injury.  -Treatment options discussed including all alternatives, risks, and complications -ACE wrap was applied to the right foot/ankle due to the mild swelling. I do not think she would tolerate an ankle brace or boot given her mobility. I discussed this with her.  -Corn left fifth digit was sharply debrided without complication/bleeding. Dispensed offloading pads. Discussed that she is to wait on a consistent basis to help prevent the quadrant coming back so quickly. - Recheck symptoms in 3 weeks or sooner if any problems are to arise. Symptoms worsen the meantime to call the office and medially. I also discussed the patient that do not treat  where she is having pain in her leg proximal to the knee. She needs to call her primary care physician today to discuss this with them.

## 2015-01-22 ENCOUNTER — Telehealth: Payer: Self-pay | Admitting: Podiatry

## 2015-01-22 NOTE — Telephone Encounter (Signed)
"  Honey I was in to see the doctor this morning.  He told me that he was going to call Dr. Vira Browns and give him a report on me.  He asked me to call Dr. Vira Browns which I did.  They told me he had not called my x-rays in yet.  What do I do?"

## 2015-01-22 NOTE — Telephone Encounter (Signed)
Patient's daughter Vinnie Level called wanting to know if patients x-rays have been sent to Dr. Lavera Guise. She states as of 5:00pm yesterday they were not sent and she does not want her mother to go through the weekend. Requested a return call today.

## 2015-01-22 NOTE — Telephone Encounter (Signed)
Spoke with daughter concerning the xray of her foot and ankle. Xray is normal she needs to follow up with dr Lavera Guise for the pain that is in her leg up to her knee. All images and notes are in epic for her primary doctor to look. Dr Jacqualyn Posey also cc dr Lavera Guise in notes

## 2015-01-24 NOTE — Telephone Encounter (Signed)
Can someone please call the primary care physicians office and get her an appointment to see him? She was having a lot of right thigh pain during the visit. She was also having right foot pain, but I do not think they are related. It seems the thigh pain was way more significant. She had called her PCP the day before our visit and they sent her to the ER. She did go but no relief.  Also, please send my note over. I thought I had copied my chart to the patients PCP but I do not know if it went though. Thank you.

## 2015-01-25 ENCOUNTER — Other Ambulatory Visit: Payer: Self-pay | Admitting: Internal Medicine

## 2015-01-25 NOTE — Telephone Encounter (Signed)
i had spoke with Tiffeny daughter on Friday letting her know to call dr Lavera Guise to follow up, also routed notes to Dr Lavera Guise regarding follow up with patient

## 2015-01-26 ENCOUNTER — Other Ambulatory Visit: Payer: Self-pay | Admitting: Internal Medicine

## 2015-01-27 NOTE — Telephone Encounter (Deleted)
Ok to fill last OV 10/15?

## 2015-01-27 NOTE — Telephone Encounter (Signed)
Please advise no longer MD patient ?

## 2015-01-28 NOTE — Telephone Encounter (Signed)
Refill for 30 days only.  Will need follow up prior to any more refills

## 2015-02-11 ENCOUNTER — Ambulatory Visit: Payer: PPO | Admitting: Podiatry

## 2015-02-13 ENCOUNTER — Other Ambulatory Visit: Payer: Self-pay | Admitting: Internal Medicine

## 2015-02-15 NOTE — Telephone Encounter (Signed)
Spoke with pts son, who states pt is a pt of Dr Lupita Dawn however as per note on 6.28.16 pt no longer pt of Dr Lupita Dawn.  Last OV 10.28.15.  Please advise refill

## 2015-02-16 NOTE — Telephone Encounter (Signed)
Refill for 30 days only.  Has not been seen in over 6 months  so patient needs a  30 minute visit

## 2015-02-16 NOTE — Telephone Encounter (Signed)
Pt scheduled 8.9.16 at 2:15 pm

## 2015-02-24 ENCOUNTER — Other Ambulatory Visit (INDEPENDENT_AMBULATORY_CARE_PROVIDER_SITE_OTHER): Payer: PPO

## 2015-02-24 ENCOUNTER — Other Ambulatory Visit: Payer: Self-pay | Admitting: Family Medicine

## 2015-02-24 ENCOUNTER — Telehealth (INDEPENDENT_AMBULATORY_CARE_PROVIDER_SITE_OTHER): Payer: PPO | Admitting: Internal Medicine

## 2015-02-24 DIAGNOSIS — R300 Dysuria: Secondary | ICD-10-CM

## 2015-02-24 LAB — URINALYSIS, ROUTINE W REFLEX MICROSCOPIC
BILIRUBIN URINE: NEGATIVE
Ketones, ur: NEGATIVE
Nitrite: POSITIVE — AB
SPECIFIC GRAVITY, URINE: 1.015 (ref 1.000–1.030)
Total Protein, Urine: NEGATIVE
URINE GLUCOSE: NEGATIVE
UROBILINOGEN UA: 0.2 (ref 0.0–1.0)
pH: 6 (ref 5.0–8.0)

## 2015-02-24 LAB — POCT URINALYSIS DIPSTICK
BILIRUBIN UA: NEGATIVE
Glucose, UA: NEGATIVE
KETONES UA: NEGATIVE
Nitrite, UA: POSITIVE
PH UA: 5.5
Protein, UA: NEGATIVE
Spec Grav, UA: 1.01
Urobilinogen, UA: 0.2

## 2015-02-24 MED ORDER — FOSFOMYCIN TROMETHAMINE 3 G PO PACK: 3.0000 g | PACK | Freq: Once | ORAL | Status: DC

## 2015-02-24 NOTE — Telephone Encounter (Signed)
Tried to reach patient by phone lefty message for patient to call office.

## 2015-02-24 NOTE — Telephone Encounter (Signed)
Evelyn [name] talked with patient and she is to bring in a urine for Possible UTI,

## 2015-02-24 NOTE — Telephone Encounter (Signed)
Spoke with pt, she is bringing in a UA.

## 2015-02-24 NOTE — Telephone Encounter (Signed)
Ok to bring her in for 11:30 today

## 2015-02-24 NOTE — Telephone Encounter (Signed)
Patient having symptoms of UTI, dysuria and frequency. Patient does not want any other provider. Schedule full can I have her come in and give UA?

## 2015-02-25 ENCOUNTER — Ambulatory Visit: Payer: PPO | Admitting: Podiatry

## 2015-02-26 ENCOUNTER — Telehealth: Payer: Self-pay | Admitting: *Deleted

## 2015-02-26 NOTE — Telephone Encounter (Signed)
Any results on Ms. Picazo's culture? Thanks!

## 2015-02-26 NOTE — Telephone Encounter (Signed)
Pt called requesting UA results.  Please advise in Dr Lupita Dawn absence

## 2015-02-26 NOTE — Telephone Encounter (Signed)
Called [facility], they are going to call back to see what is going on

## 2015-02-27 LAB — URINE CULTURE

## 2015-03-04 DIAGNOSIS — I5032 Chronic diastolic (congestive) heart failure: Secondary | ICD-10-CM | POA: Insufficient documentation

## 2015-03-09 ENCOUNTER — Ambulatory Visit (INDEPENDENT_AMBULATORY_CARE_PROVIDER_SITE_OTHER): Payer: PPO | Admitting: Nurse Practitioner

## 2015-03-09 ENCOUNTER — Telehealth: Payer: Self-pay | Admitting: *Deleted

## 2015-03-09 ENCOUNTER — Ambulatory Visit: Payer: PPO | Admitting: Internal Medicine

## 2015-03-09 VITALS — BP 122/68 | HR 65 | Temp 98.0°F | Resp 14 | Wt 134.2 lb

## 2015-03-09 DIAGNOSIS — G4700 Insomnia, unspecified: Secondary | ICD-10-CM

## 2015-03-09 DIAGNOSIS — E034 Atrophy of thyroid (acquired): Secondary | ICD-10-CM

## 2015-03-09 DIAGNOSIS — E785 Hyperlipidemia, unspecified: Secondary | ICD-10-CM

## 2015-03-09 DIAGNOSIS — D509 Iron deficiency anemia, unspecified: Secondary | ICD-10-CM

## 2015-03-09 DIAGNOSIS — I10 Essential (primary) hypertension: Secondary | ICD-10-CM | POA: Diagnosis not present

## 2015-03-09 DIAGNOSIS — E038 Other specified hypothyroidism: Secondary | ICD-10-CM | POA: Diagnosis not present

## 2015-03-09 DIAGNOSIS — F5104 Psychophysiologic insomnia: Secondary | ICD-10-CM

## 2015-03-09 DIAGNOSIS — N3000 Acute cystitis without hematuria: Secondary | ICD-10-CM

## 2015-03-09 NOTE — Progress Notes (Signed)
Pre visit review using our clinic review tool, if applicable. No additional management support is needed unless otherwise documented below in the visit note. 

## 2015-03-09 NOTE — Patient Instructions (Signed)
Please visit the lab today and follow up with Dr. Derrel Nip in 6 months.

## 2015-03-09 NOTE — Telephone Encounter (Signed)
Pt says she would like for something to call her about a urine she left a couple weeks ago

## 2015-03-09 NOTE — Telephone Encounter (Signed)
She was supposed to get the medication Dr. Lacinda Axon called in for the pt. It is the only medication she can take for her UTI since she is resistant to virtually everything else and allergic to the rest. She said it was $84, but this is cheaper than a hospital visit and IV antibiotics.

## 2015-03-09 NOTE — Telephone Encounter (Signed)
Please advise 

## 2015-03-09 NOTE — Progress Notes (Signed)
Patient ID: Evelyn [name], female    DOB: 08/12/1919  Age: 81 y.o. MRN: [id number]  CC: Follow-up   HPI Evelyn [name] presents for UTI symptom follow up, multiple BMs and GERD.   1) Hypothyroidism- Will re-check levels since the levothyroxine was changed at last visit.   2) Insomnia- was worked up last night and couldn't go to sleep, but reports otherwise she is sleeping well.   3) Anemia- no further work up per Dr. Tullo in Oct. 2015 due to age, pt was seen by GI for heme positive stool. Pt denies blood in stool recently. Pt reports taking ferrous sulfate 325 mg on list  4) Pt reports multiple bowel movements daily, small in size, dark brown, and formed. She denies changes to diet recently, but has had increased reflux symptoms. She reports she is taking the prilosec daily.   5) UTI Symptoms- pt has hx of multi-drug resistant UTIs. Dr. Cook called in the fosfomycin recently and pt reports it was $84 and she did not get it. She reports she is still having symptoms.   History Evelyn [name] has a past medical history of Hyperlipidemia; Thyroid disease; Other chronic cystitis; History of lymphoma; Self-catheterizes urinary bladder; Coronary artery disease; Coarse tremors; Insomnia; Cancer; and Anemia.   She has past surgical history that includes Cardiac catheterization (2005); Abdominal hysterectomy (1970s); Eye surgery; Joint replacement (2012); Stomach surgery; Appendectomy; Back surgery; Spine surgery (2008); Lumbar laminectomy/decompression microdiscectomy (01/24/2012); and Cystoscopy (April 2013).   Her family history includes Diabetes in her mother; Heart disease in her father.She reports that she quit smoking about 43 years ago. She has never used smokeless tobacco. She reports that she does not drink alcohol or use illicit drugs.  Outpatient Prescriptions Prior to Visit  Medication Sig Dispense Refill  . amiodarone (PACERONE) 200 MG tablet     . apixaban (ELIQUIS) 2.5 MG TABS tablet  Take by mouth.    . aspirin EC 81 MG tablet Take 81 mg by mouth daily.    . aspirin EC 81 MG tablet Take by mouth.    . conjugated estrogens (PREMARIN) vaginal cream Place 1 applicator in vagina every night for two weeks,  Then two times per week thereafter 42.5 g 12  . ergocalciferol (DRISDOL) 50000 UNITS capsule Take 1 capsule (50,000 Units total) by mouth once a week. 12 capsule 0  . ferrous sulfate 325 (65 FE) MG tablet Take 1 tablet (325 mg total) by mouth daily with breakfast. 30 tablet 5  . fosfomycin (MONUROL) 3 G PACK Take 3 g by mouth once. 3 g 0  . furosemide (LASIX) 20 MG tablet     . levothyroxine (SYNTHROID, LEVOTHROID) 112 MCG tablet TAKE ONE TABLET BY MOUTH EVERY DAY 90 tablet 2  . metoprolol tartrate (LOPRESSOR) 25 MG tablet Take 1 tablet (25 mg total) by mouth 2 (two) times daily. 180 tablet 3  . mirtazapine (REMERON) 15 MG tablet TAKE ONE TABLET BY MOUTH AT BEDTIME *NEEDS OFFICE VISIT PRIOR TO FURTHER REFILLS, PLEASE CALL OFFICE TO SCHEDULE ASAP* 30 tablet 0  . nystatin (MYCOSTATIN) powder     . omeprazole (PRILOSEC) 40 MG capsule TAKE ONE CAPSULE BY MOUTH ONCE DAILY 90 capsule 1  . predniSONE (DELTASONE) 10 MG tablet Take 2 tablets (20 mg total) by mouth daily with breakfast. 14 tablet 0  . propranolol (INDERAL) 10 MG tablet Take by mouth.    . propranolol (INDERAL) 20 MG tablet TAKE ONE TABLET BY MOUTH TWICE DAILY 60 tablet 0  .   temazepam (RESTORIL) 15 MG capsule Take by mouth.    . temazepam (RESTORIL) 22.5 MG capsule Take 1 capsule (22.5 mg total) by mouth at bedtime as needed for sleep. 30 capsule 5  . traZODone (DESYREL) 50 MG tablet One tablet after dinner for insomnia.  May repeat  Dose in one hour  If needed 60 tablet 0  . amiodarone (PACERONE) 200 MG tablet Take by mouth.    . doxycycline (VIBRA-TABS) 100 MG tablet     . fosfomycin (MONUROL) 3 G PACK Take by mouth.    . metoprolol tartrate (LOPRESSOR) 25 MG tablet Take by mouth.     No facility-administered  medications prior to visit.    ROS Review of Systems  Constitutional: Negative for fever, chills, diaphoresis and fatigue.  Respiratory: Negative for chest tightness, shortness of breath and wheezing.   Cardiovascular: Negative for chest pain, palpitations and leg swelling.  Gastrointestinal: Negative for nausea, vomiting, abdominal pain, diarrhea, constipation, blood in stool, abdominal distention and anal bleeding.       Increased reflux  Skin: Negative for rash.  Neurological: Positive for tremors. Negative for dizziness, weakness, numbness and headaches.  Psychiatric/Behavioral: The patient is not nervous/anxious.     Objective:  BP 122/68 mmHg  Pulse 65  Temp(Src) 98 F (36.7 C)  Resp 14  Wt 134 lb 3.2 oz (60.873 kg)  SpO2 99%  Physical Exam  Constitutional: She is oriented to person, place, and time. She appears well-developed and well-nourished. No distress.  HENT:  Head: Normocephalic and atraumatic.  Right Ear: External ear normal.  Left Ear: External ear normal.  Eyes: Right eye exhibits no discharge. Left eye exhibits no discharge. No scleral icterus.  Neck: Normal range of motion. Neck supple.  Cardiovascular: Normal rate and regular rhythm.   Pulmonary/Chest: Effort normal and breath sounds normal. No respiratory distress. She has no wheezes. She has no rales. She exhibits no tenderness.  Musculoskeletal:  Aided by rolling walker  Neurological: She is alert and oriented to person, place, and time. No cranial nerve deficit. She exhibits normal muscle tone. Coordination normal.  Skin: Skin is warm and dry. No rash noted. She is not diaphoretic.  Psychiatric: She has a normal mood and affect. Her behavior is normal. Judgment and thought content normal.      Assessment & Plan:   [name] was seen today for follow-up.  Diagnoses and all orders for this visit:  Chronic insomnia  Hypothyroidism due to acquired atrophy of thyroid -     TSH -     T4,  free  Essential hypertension -     Comp Met (CMET)  Hyperlipidemia  Anemia, iron deficiency  Acute cystitis without hematuria   I have discontinued [name]'s doxycycline. I am also having her maintain her aspirin EC, conjugated estrogens, ergocalciferol, ferrous sulfate, temazepam, predniSONE, traZODone, metoprolol tartrate, levothyroxine, apixaban, propranolol, aspirin EC, temazepam, amiodarone, furosemide, nystatin, omeprazole, mirtazapine, propranolol, and fosfomycin.  No orders of the defined types were placed in this encounter.     Follow-up: Return in about 6 months (around 09/09/2015) for Chronic issues follow up. 

## 2015-03-09 NOTE — Telephone Encounter (Signed)
Patient notified and voiced understanding.

## 2015-03-10 ENCOUNTER — Encounter: Payer: Self-pay | Admitting: Internal Medicine

## 2015-03-10 LAB — TSH: TSH: 6.03 u[IU]/mL — ABNORMAL HIGH (ref 0.35–4.50)

## 2015-03-10 LAB — COMPREHENSIVE METABOLIC PANEL
ALK PHOS: 60 U/L (ref 39–117)
ALT: 9 U/L (ref 0–35)
AST: 13 U/L (ref 0–37)
Albumin: 3.8 g/dL (ref 3.5–5.2)
BUN: 16 mg/dL (ref 6–23)
CO2: 28 mEq/L (ref 19–32)
Calcium: 9.1 mg/dL (ref 8.4–10.5)
Chloride: 102 mEq/L (ref 96–112)
Creatinine, Ser: 0.93 mg/dL (ref 0.40–1.20)
GFR: 59.84 mL/min — ABNORMAL LOW (ref >60.00)
Glucose, Bld: 86 mg/dL (ref 70–99)
Potassium: 5.3 mEq/L — ABNORMAL HIGH (ref 3.5–5.1)
SODIUM: 137 meq/L (ref 135–145)
TOTAL PROTEIN: 6.5 g/dL (ref 6.0–8.3)
Total Bilirubin: 0.4 mg/dL (ref 0.2–1.2)

## 2015-03-10 LAB — T4, FREE: Free T4: 1.26 ng/dL (ref 0.60–1.60)

## 2015-03-10 NOTE — Progress Notes (Unsigned)
Nurse advised by patient verbally that she is a patient of Dr. Derrel Nip and wishes to remain so. Nurse also advised by MD that she will continue as patient .

## 2015-03-10 NOTE — Progress Notes (Signed)
PCP has been changed.

## 2015-03-12 ENCOUNTER — Encounter: Payer: Self-pay | Admitting: Nurse Practitioner

## 2015-03-12 NOTE — Assessment & Plan Note (Signed)
Lab Results  Component Value Date   CHOL 260* 03/23/2014   HDL 43.60 03/23/2014   LDLCALC 191* 12/26/2013   LDLDIRECT 210.1 03/23/2014   TRIG 268.0* 03/23/2014   CHOLHDL 6 03/23/2014   Pt is 81 yo and has a lot of expensive medications. Encouraged healthy diet and keep moving.

## 2015-03-12 NOTE — Assessment & Plan Note (Signed)
Medication was upped last visit. Will obtain TSH and Free T4 at this visit. Pt denies hypothyroid symptoms.

## 2015-03-12 NOTE — Assessment & Plan Note (Signed)
BP Readings from Last 3 Encounters:  03/09/15 122/68  08/27/14 119/73  07/10/14 137/72   Lab Results  Component Value Date   CREATININE 0.93 03/09/2015   Stable. Will FU in 6 months. Obtain CMET today

## 2015-03-12 NOTE — Assessment & Plan Note (Signed)
Recent UTI was prescribed fosfomycin, which pt did not pick up. Encouraged pt to do so due to pan resistance to oral abx. Pt was encouraged to do so or she can go to ER for IV abx. This was relayed to her again on the phone after the visit by Kerin Salen.

## 2015-03-12 NOTE — Assessment & Plan Note (Signed)
Stable currently on 22.5 mg of temazepam at bedtime. No further concerns, pt does report living alone. Will follow.

## 2015-03-29 ENCOUNTER — Telehealth: Payer: Self-pay | Admitting: *Deleted

## 2015-03-29 NOTE — Telephone Encounter (Signed)
Pt states she thought she paid the $10.00 requested on the statement.

## 2015-04-16 ENCOUNTER — Other Ambulatory Visit: Payer: Self-pay | Admitting: *Deleted

## 2015-04-16 DIAGNOSIS — C8590 Non-Hodgkin lymphoma, unspecified, unspecified site: Secondary | ICD-10-CM

## 2015-04-19 ENCOUNTER — Inpatient Hospital Stay: Payer: PPO | Admitting: Oncology

## 2015-04-19 ENCOUNTER — Inpatient Hospital Stay: Payer: PPO | Attending: Oncology

## 2015-04-27 ENCOUNTER — Inpatient Hospital Stay: Payer: PPO

## 2015-04-27 ENCOUNTER — Inpatient Hospital Stay: Payer: PPO | Admitting: Oncology

## 2015-05-05 ENCOUNTER — Emergency Department: Payer: PPO

## 2015-05-05 ENCOUNTER — Inpatient Hospital Stay: Payer: PPO | Admitting: Oncology

## 2015-05-05 ENCOUNTER — Inpatient Hospital Stay
Admission: EM | Admit: 2015-05-05 | Discharge: 2015-05-10 | DRG: 689 | Disposition: A | Discharge status: Skilled Nursing Facility | Payer: PPO | Attending: Internal Medicine | Admitting: Internal Medicine

## 2015-05-05 ENCOUNTER — Encounter: Payer: Self-pay | Admitting: Emergency Medicine

## 2015-05-05 ENCOUNTER — Inpatient Hospital Stay: Payer: PPO | Attending: Oncology

## 2015-05-05 DIAGNOSIS — Z8673 Personal history of transient ischemic attack (TIA), and cerebral infarction without residual deficits: Secondary | ICD-10-CM

## 2015-05-05 DIAGNOSIS — Z96641 Presence of right artificial hip joint: Secondary | ICD-10-CM | POA: Diagnosis present

## 2015-05-05 DIAGNOSIS — Z880 Allergy status to penicillin: Secondary | ICD-10-CM

## 2015-05-05 DIAGNOSIS — R4182 Altered mental status, unspecified: Secondary | ICD-10-CM

## 2015-05-05 DIAGNOSIS — R55 Syncope and collapse: Secondary | ICD-10-CM | POA: Diagnosis not present

## 2015-05-05 DIAGNOSIS — N3000 Acute cystitis without hematuria: Principal | ICD-10-CM | POA: Diagnosis present

## 2015-05-05 DIAGNOSIS — Z888 Allergy status to other drugs, medicaments and biological substances status: Secondary | ICD-10-CM

## 2015-05-05 DIAGNOSIS — Z882 Allergy status to sulfonamides status: Secondary | ICD-10-CM

## 2015-05-05 DIAGNOSIS — K219 Gastro-esophageal reflux disease without esophagitis: Secondary | ICD-10-CM | POA: Diagnosis present

## 2015-05-05 DIAGNOSIS — Z86718 Personal history of other venous thrombosis and embolism: Secondary | ICD-10-CM

## 2015-05-05 DIAGNOSIS — Z8572 Personal history of non-Hodgkin lymphomas: Secondary | ICD-10-CM

## 2015-05-05 DIAGNOSIS — B9620 Unspecified Escherichia coli [E. coli] as the cause of diseases classified elsewhere: Secondary | ICD-10-CM | POA: Diagnosis present

## 2015-05-05 DIAGNOSIS — I2510 Atherosclerotic heart disease of native coronary artery without angina pectoris: Secondary | ICD-10-CM | POA: Diagnosis present

## 2015-05-05 DIAGNOSIS — E039 Hypothyroidism, unspecified: Secondary | ICD-10-CM | POA: Diagnosis present

## 2015-05-05 DIAGNOSIS — Z9049 Acquired absence of other specified parts of digestive tract: Secondary | ICD-10-CM

## 2015-05-05 DIAGNOSIS — G4700 Insomnia, unspecified: Secondary | ICD-10-CM | POA: Diagnosis present

## 2015-05-05 DIAGNOSIS — Z8249 Family history of ischemic heart disease and other diseases of the circulatory system: Secondary | ICD-10-CM

## 2015-05-05 DIAGNOSIS — N390 Urinary tract infection, site not specified: Secondary | ICD-10-CM | POA: Diagnosis present

## 2015-05-05 DIAGNOSIS — I10 Essential (primary) hypertension: Secondary | ICD-10-CM | POA: Diagnosis present

## 2015-05-05 DIAGNOSIS — Z79899 Other long term (current) drug therapy: Secondary | ICD-10-CM

## 2015-05-05 DIAGNOSIS — R112 Nausea with vomiting, unspecified: Secondary | ICD-10-CM

## 2015-05-05 DIAGNOSIS — E785 Hyperlipidemia, unspecified: Secondary | ICD-10-CM | POA: Diagnosis present

## 2015-05-05 DIAGNOSIS — Z9889 Other specified postprocedural states: Secondary | ICD-10-CM

## 2015-05-05 DIAGNOSIS — Z955 Presence of coronary angioplasty implant and graft: Secondary | ICD-10-CM

## 2015-05-05 DIAGNOSIS — G9340 Encephalopathy, unspecified: Secondary | ICD-10-CM | POA: Diagnosis present

## 2015-05-05 DIAGNOSIS — Z87891 Personal history of nicotine dependence: Secondary | ICD-10-CM

## 2015-05-05 DIAGNOSIS — Z833 Family history of diabetes mellitus: Secondary | ICD-10-CM

## 2015-05-05 LAB — BASIC METABOLIC PANEL
Anion gap: 11 (ref 5–15)
BUN: 17 mg/dL (ref 6–20)
CHLORIDE: 108 mmol/L (ref 101–111)
CO2: 23 mmol/L (ref 22–32)
CREATININE: 1.33 mg/dL — AB (ref 0.44–1.00)
Calcium: 8.4 mg/dL — ABNORMAL LOW (ref 8.9–10.3)
GFR calc non Af Amer: 34 mL/min — ABNORMAL LOW (ref >60)
GFR, EST AFRICAN AMERICAN: 39 mL/min — AB (ref >60)
Glucose, Bld: 236 mg/dL — ABNORMAL HIGH (ref 65–99)
POTASSIUM: 3.5 mmol/L (ref 3.5–5.1)
Sodium: 142 mmol/L (ref 135–145)

## 2015-05-05 LAB — URINALYSIS COMPLETE WITH MICROSCOPIC (ARMC ONLY)
BILIRUBIN URINE: NEGATIVE
GLUCOSE, UA: NEGATIVE mg/dL
Hgb urine dipstick: NEGATIVE
NITRITE: POSITIVE — AB
Protein, ur: NEGATIVE mg/dL
Specific Gravity, Urine: 1.014 (ref 1.005–1.030)
pH: 5 (ref 5.0–8.0)

## 2015-05-05 LAB — CBC WITH DIFFERENTIAL/PLATELET
BASOS PCT: 1 %
Basophils Absolute: 0.2 10*3/uL — ABNORMAL HIGH (ref 0–0.1)
Eosinophils Absolute: 0.3 10*3/uL (ref 0–0.7)
Eosinophils Relative: 2 %
HEMATOCRIT: 37.1 % (ref 35.0–47.0)
HEMOGLOBIN: 12.1 g/dL (ref 12.0–16.0)
LYMPHS ABS: 4.3 10*3/uL — AB (ref 1.0–3.6)
Lymphocytes Relative: 31 %
MCH: 27.5 pg (ref 26.0–34.0)
MCHC: 32.6 g/dL (ref 32.0–36.0)
MCV: 84.5 fL (ref 80.0–100.0)
Monocytes Absolute: 1.3 10*3/uL — ABNORMAL HIGH (ref 0.2–0.9)
Monocytes Relative: 9 %
NEUTROS ABS: 7.8 10*3/uL — AB (ref 1.4–6.5)
NEUTROS PCT: 57 %
Platelets: 337 10*3/uL (ref 150–440)
RBC: 4.39 MIL/uL (ref 3.80–5.20)
RDW: 16.9 % — ABNORMAL HIGH (ref 11.5–14.5)
WBC: 13.9 10*3/uL — AB (ref 3.6–11.0)

## 2015-05-05 LAB — PROTIME-INR
INR: 1
PROTHROMBIN TIME: 13.4 s (ref 11.4–15.0)

## 2015-05-05 LAB — TROPONIN I: Troponin I: <0.03 ng/mL (ref <0.031)

## 2015-05-05 LAB — APTT: APTT: 25 s (ref 24–36)

## 2015-05-05 MED ORDER — APIXABAN 2.5 MG PO TABS
2.5000 mg | ORAL_TABLET | Freq: Two times a day (BID) | ORAL | Status: DC
Start: 2015-05-05 — End: 2015-05-10
  Administered 2015-05-05 – 2015-05-10 (×10): 2.5 mg via ORAL
  Filled 2015-05-05 (×10): qty 1

## 2015-05-05 MED ORDER — ONDANSETRON HCL 4 MG PO TABS: 4.0000 mg | ORAL_TABLET | Freq: Four times a day (QID) | ORAL | Status: DC | PRN

## 2015-05-05 MED ORDER — ZOLPIDEM TARTRATE 5 MG PO TABS
5.0000 mg | ORAL_TABLET | Freq: Every evening | ORAL | Status: DC | PRN
  Administered 2015-05-05: 5 mg via ORAL
  Filled 2015-05-05: qty 1

## 2015-05-05 MED ORDER — URELLE 81 MG PO TABS
81.0000 mg | ORAL_TABLET | Freq: Three times a day (TID) | ORAL | Status: DC
  Administered 2015-05-05 – 2015-05-10 (×14): 81 mg via ORAL
  Filled 2015-05-05 (×16): qty 1

## 2015-05-05 MED ORDER — ADULT MULTIVITAMIN W/MINERALS CH
1.0000 | ORAL_TABLET | Freq: Every day | ORAL | Status: DC
  Administered 2015-05-05 – 2015-05-10 (×6): 1 via ORAL
  Filled 2015-05-05 (×6): qty 1

## 2015-05-05 MED ORDER — ONDANSETRON HCL 4 MG/2ML IJ SOLN
4.0000 mg | Freq: Four times a day (QID) | INTRAMUSCULAR | Status: DC | PRN
  Administered 2015-05-07: 4 mg via INTRAVENOUS
  Filled 2015-05-05: qty 2

## 2015-05-05 MED ORDER — LEVOTHYROXINE SODIUM 100 MCG PO TABS
100.0000 ug | ORAL_TABLET | Freq: Every day | ORAL | Status: DC
  Administered 2015-05-06 – 2015-05-10 (×5): 100 ug via ORAL
  Filled 2015-05-05 (×5): qty 1

## 2015-05-05 MED ORDER — PROPRANOLOL HCL 20 MG PO TABS
10.0000 mg | ORAL_TABLET | Freq: Two times a day (BID) | ORAL | Status: DC
  Administered 2015-05-05 – 2015-05-10 (×8): 10 mg via ORAL
  Filled 2015-05-05 (×11): qty 1

## 2015-05-05 MED ORDER — VITAMIN D (ERGOCALCIFEROL) 1.25 MG (50000 UNIT) PO CAPS
50000.0000 [IU] | ORAL_CAPSULE | ORAL | Status: DC
  Administered 2015-05-05: 50000 [IU] via ORAL
  Filled 2015-05-05: qty 1

## 2015-05-05 MED ORDER — SODIUM CHLORIDE 0.9 % IJ SOLN
3.0000 mL | Freq: Two times a day (BID) | INTRAMUSCULAR | Status: DC
  Administered 2015-05-06 – 2015-05-09 (×8): 3 mL via INTRAVENOUS

## 2015-05-05 MED ORDER — TEMAZEPAM 15 MG PO CAPS
15.0000 mg | ORAL_CAPSULE | Freq: Every evening | ORAL | Status: DC | PRN
  Administered 2015-05-06: 15 mg via ORAL
  Filled 2015-05-05: qty 1

## 2015-05-05 MED ORDER — PANTOPRAZOLE SODIUM 40 MG PO TBEC
40.0000 mg | DELAYED_RELEASE_TABLET | Freq: Every day | ORAL | Status: DC
  Administered 2015-05-05 – 2015-05-10 (×6): 40 mg via ORAL
  Filled 2015-05-05 (×6): qty 1

## 2015-05-05 MED ORDER — ACETAMINOPHEN 325 MG PO TABS
650.0000 mg | ORAL_TABLET | Freq: Four times a day (QID) | ORAL | Status: DC | PRN
Start: 2015-05-05 — End: 2015-05-10
  Administered 2015-05-07: 650 mg via ORAL
  Filled 2015-05-05: qty 2

## 2015-05-05 MED ORDER — ACETAMINOPHEN 650 MG RE SUPP: 650.0000 mg | Freq: Four times a day (QID) | RECTAL | Status: DC | PRN

## 2015-05-05 MED ORDER — IOHEXOL 350 MG/ML SOLN
75.0000 mL | Freq: Once | INTRAVENOUS | Status: AC | PRN
  Administered 2015-05-05: 75 mL via INTRAVENOUS

## 2015-05-05 MED ORDER — VITAMIN B-12 1000 MCG PO TABS
1000.0000 ug | ORAL_TABLET | Freq: Every day | ORAL | Status: DC
  Administered 2015-05-05 – 2015-05-10 (×6): 1000 ug via ORAL
  Filled 2015-05-05 (×6): qty 1

## 2015-05-05 NOTE — ED Notes (Signed)
Stroke Nurse summary of care. Pt arrived by EMS at approx 1427 for co syncope with N/V.  Pt initially placed in hallway bed awaiting physician.  EMS states they received the call at 1330 which is they best last known well available. Pt's granddaughters at bedside, states that the pt has had cancer x6, bad UTI's, and is on Eloquis for heart issues and prior "blood clot".  Code stroke was called at 1502 after pt had another syncopal episode in ER.  Pt lost consciousness for 2-3 minutes per triage nurse, and vomited. Pt sent straight to CT. [place] equipment placed in pt's room. On return from CT, [place] was on screen, conducted assessment, NIHSS 2. CT negative for bleed. Pt alert and oriented. Per SOC the pt is not a candidate for tPA since she is on Eloquis, and NIHSS 2. Primary nurse instructed to continue to monitor VS and neuro Q2hr x 12 then Q4hr. Report given to pt's family. EDP with family at this time.

## 2015-05-05 NOTE — ED Notes (Signed)
Called for neuropsych Joelene Millin  for consult immediately when pt passed by desk coming back from [place]

## 2015-05-05 NOTE — ED Notes (Signed)
Ems pt from [place] parking lot , pt with syncope while in the car at [place] , pt complaining of N/V " I am feeling sick"

## 2015-05-05 NOTE — ED Notes (Signed)
Attempted to call report to 2A nurse - advised that nurse and charge nurse in room with MD at this time. HUC to have primary nurse return call to this RN. ED charge nurse aware of transfer delay.

## 2015-05-05 NOTE — H&P (Signed)
[facility] [place] NAME: Evelyn [name]    MR#:  [id number]  DATE OF BIRTH:  04/05/1920  DATE OF ADMISSION:  05/05/2015  PRIMARY CARE PHYSICIAN: Crecencio Mc, MD   REQUESTING/REFERRING PHYSICIAN: Dr. Francene Castle  CHIEF COMPLAINT:   Chief Complaint  Patient presents with  . Loss of Consciousness   syncope/seizure  HISTORY OF PRESENT ILLNESS:  Evelyn [name]  is a 81 y.o. female with a known history of coronary disease, hypothyroidism, hypertension, hyperlipidemia, chronic anemia, history of lymphoma, history of previous TIA who presents to the hospital after a syncopal episode. Patient herself cannot recall exactly what happened therefore most of the history obtained from this nursing staff and also the family at bedside. As per the family patient was at [facility] shopping with her friends when she was not feeling well and was a bit nauseated and then while she was walking to her car she had a syncopal episode. EMS was called and by the time she came to the emergency room patient's mental status was back to baseline. Patient was in the ER continued to have recurrent vomiting and had another syncopal episode. There was no evidence of any acute myoclonic jerks or any movements and patient was not incontinent. She underwent a CT scan of the head in the emergency room which was negative for any acute pathology. Given the recurrence of his symptoms hospitalist services were contacted further treatment and evaluation.  PAST MEDICAL HISTORY:   Past Medical History  Diagnosis Date  . Hyperlipidemia   . Thyroid disease     had thyroid removed  . Other chronic cystitis     Radiation , botox injections  . History of lymphoma   . Self-catheterizes urinary bladder     twice daily  . Coronary artery disease   . Coarse tremors     head shakes if does not take propranolol  . Insomnia     takes remeron nightly  . Cancer ([place])      lymphomia  . Anemia     hx of blood transfusion    PAST SURGICAL HISTORY:   Past Surgical History  Procedure Laterality Date  . Cardiac catheterization  2005    2 cardiac stents  . Abdominal hysterectomy  1970s  . Eye surgery      bilateral cataracts  . Joint replacement  2012    right hip  . Stomach surgery      removal all but small portion of stomach out due to ulcers  . Appendectomy    . Back surgery      lower back - bulging disk  . Spine surgery  2008    [place]  . Lumbar laminectomy/decompression microdiscectomy  01/24/2012    Procedure: LUMBAR LAMINECTOMY/DECOMPRESSION MICRODISCECTOMY 2 LEVELS;  Surgeon: Floyce Stakes, MD;  Location: [facility];  Service: Neurosurgery;  Laterality: Right;  Right Lumbar four five, lumbar five sacral one foraminotomy   . Cystoscopy  April 2013    SOCIAL HISTORY:   Social History  Substance Use Topics  . Smoking status: Former Smoker    Quit date: 04/04/1971  . Smokeless tobacco: Never Used  . Alcohol Use: No    FAMILY HISTORY:   Family History  Problem Relation Age of Onset  . Diabetes Mother   . Heart disease Father     DRUG ALLERGIES:   Allergies  Allergen Reactions  . Oxycodone Nausea Only  . Penicillins Swelling, Rash  and Other (See Comments)    Pt is unable to answer additional questions about this medication.    . Prednisone Rash  . Sulfa Antibiotics Swelling and Rash    REVIEW OF SYSTEMS:   Review of Systems  Constitutional: Negative for fever and weight loss.  HENT: Negative for congestion, nosebleeds and tinnitus.   Eyes: Negative for blurred vision, double vision and redness.  Respiratory: Negative for cough, hemoptysis and shortness of breath.   Cardiovascular: Negative for chest pain, orthopnea, leg swelling and PND.  Gastrointestinal: Negative for nausea, vomiting, abdominal pain, diarrhea and melena.  Genitourinary: Negative for dysuria, urgency and hematuria.  Musculoskeletal: Negative for  joint pain and falls.  Neurological: Negative for dizziness, tingling, sensory change, focal weakness, seizures, weakness and headaches.  Endo/Heme/Allergies: Negative for polydipsia. Does not bruise/bleed easily.  Psychiatric/Behavioral: Negative for depression and memory loss. The patient is not nervous/anxious.     MEDICATIONS AT HOME:   Prior to Admission medications   Medication Sig Start Date End Date Taking? Authorizing Provider  apixaban (ELIQUIS) 2.5 MG TABS tablet Take 2.5 mg by mouth 2 (two) times daily.   Yes Historical Provider, MD  FeFum-FePo-FA-B Cmp-C-Zn-Mn-Cu (SE-TAN PLUS) 162-115.2-1 MG CAPS Take 1 tablet by mouth daily.   Yes Historical Provider, MD  levothyroxine (SYNTHROID, LEVOTHROID) 100 MCG tablet Take 100 mcg by mouth daily.   Yes Historical Provider, MD  Methen-Hyosc-Meth Blue-Na Phos (UROGESIC-BLUE) 81.6 MG TABS Take 1 tablet by mouth 3 (three) times daily.   Yes Historical Provider, MD  omeprazole (PRILOSEC) 20 MG capsule Take 20 mg by mouth daily.   Yes Historical Provider, MD  propranolol (INDERAL) 10 MG tablet Take 10 mg by mouth 2 (two) times daily.   Yes Historical Provider, MD  temazepam (RESTORIL) 15 MG capsule Take 15 mg by mouth at bedtime as needed for sleep.   Yes Historical Provider, MD  vitamin B-12 (CYANOCOBALAMIN) 1000 MCG tablet Take 1,000 mcg by mouth daily.   Yes Historical Provider, MD  Vitamin D, Ergocalciferol, (DRISDOL) 50000 UNITS CAPS capsule Take 50,000 Units by mouth every 7 (seven) days.   Yes Historical Provider, MD  zolpidem (AMBIEN) 10 MG tablet Take 10 mg by mouth at bedtime as needed for sleep.   Yes Historical Provider, MD  fosfomycin (MONUROL) 3 G PACK Take 3 g by mouth once. Patient not taking: Reported on 05/05/2015 02/24/15   Coral Spikes, DO      VITAL SIGNS:  Blood pressure 127/61, pulse 78, temperature 96.1 F (35.6 C), temperature source Rectal, resp. rate 12, height 5\' 6"  (1.676 m), weight 64.411 kg (142 lb), SpO2 100  %.  PHYSICAL EXAMINATION:  Physical Exam  GENERAL:  81 y.o.-year-old patient lying in the bed with no acute distress.  EYES: Pupils equal, round, reactive to light and accommodation. No scleral icterus. Extraocular muscles intact.  HEENT: Head atraumatic, normocephalic. Oropharynx and nasopharynx clear. No oropharyngeal erythema, moist oral mucosa  NECK:  Supple, no jugular venous distention. No thyroid enlargement, no tenderness.  LUNGS: Normal breath sounds bilaterally, no wheezing, rales, rhonchi. No use of accessory muscles of respiration.  CARDIOVASCULAR: S1, S2 RRR. 2/6 systolic ejection murmur heard at the left sternal border with radiation to the carotids, no rubs, gallops, clicks.  ABDOMEN: Soft, nontender, nondistended. Bowel sounds present. No organomegaly or mass.  EXTREMITIES: No pedal edema, cyanosis, or clubbing. + 2 pedal & radial pulses b/l.   NEUROLOGIC: Cranial nerves II through XII are intact. No focal Motor or sensory  deficits appreciated b/l.  Globally Weak PSYCHIATRIC: The patient is alert and oriented x 3. Good affect.  SKIN: No obvious rash, lesion, or ulcer.   LABORATORY PANEL:   CBC  Recent Labs Lab 05/05/15 1459  WBC 13.9*  HGB 12.1  HCT 37.1  PLT 337   ------------------------------------------------------------------------------------------------------------------  Chemistries   Recent Labs Lab 05/05/15 1459  NA 142  K 3.5  CL 108  CO2 23  GLUCOSE 236*  BUN 17  CREATININE 1.33*  CALCIUM 8.4*   ------------------------------------------------------------------------------------------------------------------  Cardiac Enzymes  Recent Labs Lab 05/05/15 1459  TROPONINI <0.03   ------------------------------------------------------------------------------------------------------------------  RADIOLOGY:  Dg Chest 1 View  05/05/2015   CLINICAL DATA:  Altered mental status  EXAM: CHEST 1 VIEW  COMPARISON:  April 15, 2014  FINDINGS:  There is no edema or consolidation. Heart size and pulmonary vascularity are normal. No adenopathy. No bone lesions.  IMPRESSION: No edema or consolidation.   Electronically Signed   By: Lowella Grip III M.D.   On: 05/05/2015 15:52   Dg Pelvis 1-2 Views  05/05/2015   CLINICAL DATA:  Suspected unwitnessed fall, pelvic pain, history of prior ORIF for a right hip fracture.  EXAM: PELVIS - 1-2 VIEW  COMPARISON:  12/04/2013  FINDINGS: Previous fixation screws of the right hip for a remote fracture. Right iliac vascular stents noted. Bones are osteopenic. Pelvis and hips appear intact. No displaced fracture or diastasis. Degenerative changes of the spine and SI joints. Normal bowel gas pattern.  IMPRESSION: Stable postoperative findings.  Osteopenia  No acute osseous finding by plain radiography   Electronically Signed   By: Jerilynn Mages.  Shick M.D.   On: 05/05/2015 16:49   Ct Head Wo Contrast  05/05/2015   CLINICAL DATA:  Syncope in the [employer] parking lot. Nausea and vomiting. Unresponsive. Altered mental status. Code stroke.  EXAM: CT HEAD WITHOUT CONTRAST  TECHNIQUE: Contiguous axial images were obtained from the base of the skull through the vertex without intravenous contrast.  COMPARISON:  12/04/2013  FINDINGS: Remote lacunar infarcts in the right cerebellum, genu of the right internal capsule, and a left external capsule noted. Otherwise, the brainstem, cerebellum, cerebral peduncles, thalamus, basal ganglia, basilar cisterns, and ventricular system appear within normal limits. Periventricular white matter and corona radiata hypodensities favor chronic ischemic microvascular white matter disease. No intracranial hemorrhage, mass lesion, or acute CVA.  Chronic right maxillary, ethmoid, and right sphenoid sinusitis.  IMPRESSION: 1. No acute intracranial findings are identified. Several small remote lacunar infarcts are present and there is evidence of chronic ischemic microvascular white matter disease. 2. Chronic  right maxillary, ethmoid, and right sphenoid sinusitis. These results were called by telephone at the time of interpretation on 05/05/2015 at 3:30 pm to Dr. Larae Grooms , who verbally acknowledged these results.   Electronically Signed   By: Van Clines M.D.   On: 05/05/2015 15:37     IMPRESSION AND PLAN:   81 year old female with past medical history of hypertension, hyperlipidemia, hypothyroidism, history of previous lymphoma, coronary disease, history of previous TIA who presents to the hospital due to a syncopal episode.  #1 syncope with collapse-etiology unclear presently.  Questionable neurogenic versus cardiogenic. -We will observe on telemetry, patient CT head on admission is negative. We'll get MRI of brain, carotid duplex, two-dimensional echocardiogram. -We'll also get a neurology consult. Patient may need an EEG but patient really did not have a postictal state after 2 syncopal episodes.  #2 hypothyroidism-continue Synthroid.  #3 history of previous DVT-continue Eliquis.  #  4 hypertension-continue propranolol.  #5 history of insomnia-continue Restoril.  #6 GERD-continue Protonix.    All the records are reviewed and case discussed with ED provider. Management plans discussed with the patient, family and they are in agreement.  CODE STATUS: Full  TOTAL TIME TAKING CARE OF THIS PATIENT: 45 minutes.    Henreitta Leber M.D on 05/05/2015 at 7:16 PM  Between 7am to 6pm - Pager - [phone]  After 6pm go to [email or web address] - password [email or web address] [facility]  Office  [phone]  CC: Primary care physician; Crecencio Mc, MD

## 2015-05-05 NOTE — ED Notes (Signed)
Called code stroke at 1502

## 2015-05-05 NOTE — ED Provider Notes (Signed)
[facility] Emergency Department Provider Note  ____________________________________________  Time seen: Approximately 245 PM  I have reviewed the triage vital signs and the nursing notes.   HISTORY  Chief Complaint Loss of Consciousness    HPI Evelyn [name] is a 81 y.o. female with a history of "tachycardia" on apixaban who is presenting today with vomiting and syncope. She was with a friend today [employer] at about 1:30 when the symptoms began. She was brought over by EMS to the emergency department. About 10-15 minutes after being in the emergency department the patient again vomiting and became unresponsive. In the emergency department after vomiting the patient regained eye movement as well as the ability to follow simple commands after about 1 minute.   Past Medical History  Diagnosis Date  . Hyperlipidemia   . Thyroid disease     had thyroid removed  . Other chronic cystitis     Radiation , botox injections  . History of lymphoma   . Self-catheterizes urinary bladder     twice daily  . Coronary artery disease   . Coarse tremors     head shakes if does not take propranolol  . Insomnia     takes remeron nightly  . Cancer ([place])     lymphomia  . Anemia     hx of blood transfusion    Patient Active Problem List   Diagnosis Date Noted  . Candidiasis of perineum 05/27/2014  . Tachycardia 05/27/2014  . Diarrhea 12/18/2013  . Rectal pain 12/14/2013  . ESBL (extended spectrum beta-lactamase) producing bacteria infection 12/09/2013  . Unspecified vitamin D deficiency 11/25/2013  . Chronic insomnia 11/23/2013  . History of recent fall 11/23/2013  . Bradycardia 04/10/2013  . Generalized muscle weakness 04/10/2013  . Anemia, iron deficiency 12/27/2012  . Dysphagia, pharyngoesophageal phase 07/06/2012  . Anemia, pernicious 10/15/2011  . Spinal stenosis of lumbar region 10/15/2011  . Hypertension   . Hyperlipidemia   . Hypothyroidism   .  Other chronic cystitis   . History of lymphoma   . Urinary tract infection 07/14/2011    Past Surgical History  Procedure Laterality Date  . Cardiac catheterization  2005    2 cardiac stents  . Abdominal hysterectomy  1970s  . Eye surgery      bilateral cataracts  . Joint replacement  2012    right hip  . Stomach surgery      removal all but small portion of stomach out due to ulcers  . Appendectomy    . Back surgery      lower back - bulging disk  . Spine surgery  2008    [place]  . Lumbar laminectomy/decompression microdiscectomy  01/24/2012    Procedure: LUMBAR LAMINECTOMY/DECOMPRESSION MICRODISCECTOMY 2 LEVELS;  Surgeon: Floyce Stakes, MD;  Location: [place] [facility];  Service: Neurosurgery;  Laterality: Right;  Right Lumbar four five, lumbar five sacral one foraminotomy   . Cystoscopy  April 2013    Current Outpatient Rx  Name  Route  Sig  Dispense  Refill  . apixaban (ELIQUIS) 2.5 MG TABS tablet   Oral   Take 2.5 mg by mouth 2 (two) times daily.         Marland Kitchen omeprazole (PRILOSEC) 20 MG capsule   Oral   Take 20 mg by mouth daily.         . temazepam (RESTORIL) 15 MG capsule   Oral   Take 15 mg by mouth at bedtime as needed for sleep.         Marland Kitchen  zolpidem (AMBIEN) 10 MG tablet   Oral   Take 10 mg by mouth at bedtime as needed for sleep.         Marland Kitchen amiodarone (PACERONE) 200 MG tablet               . aspirin EC 81 MG tablet   Oral   Take 81 mg by mouth daily.         Marland Kitchen conjugated estrogens (PREMARIN) vaginal cream      Place 1 applicator in vagina every night for two weeks,  Then two times per week thereafter   42.5 g   12   . ergocalciferol (DRISDOL) 50000 UNITS capsule   Oral   Take 1 capsule (50,000 Units total) by mouth once a week.   12 capsule   0   . ferrous sulfate 325 (65 FE) MG tablet   Oral   Take 1 tablet (325 mg total) by mouth daily with breakfast.   30 tablet   5   . fosfomycin (MONUROL) 3 G PACK   Oral   Take 3 g by mouth  once.   3 g   0   . furosemide (LASIX) 20 MG tablet               . levothyroxine (SYNTHROID, LEVOTHROID) 112 MCG tablet      TAKE ONE TABLET BY MOUTH EVERY DAY   90 tablet   2     PHARMACY NOTE DOSE CHANGE   . metoprolol tartrate (LOPRESSOR) 25 MG tablet   Oral   Take 1 tablet (25 mg total) by mouth 2 (two) times daily.   180 tablet   3   . mirtazapine (REMERON) 15 MG tablet      TAKE ONE TABLET BY MOUTH AT BEDTIME *NEEDS OFFICE VISIT PRIOR TO FURTHER REFILLS, PLEASE CALL OFFICE TO SCHEDULE ASAP*   30 tablet   0   . nystatin (MYCOSTATIN) powder               . predniSONE (DELTASONE) 10 MG tablet   Oral   Take 2 tablets (20 mg total) by mouth daily with breakfast.   14 tablet   0   . propranolol (INDERAL) 10 MG tablet   Oral   Take by mouth.         . propranolol (INDERAL) 20 MG tablet      TAKE ONE TABLET BY MOUTH TWICE DAILY   60 tablet   0   . traZODone (DESYREL) 50 MG tablet      One tablet after dinner for insomnia.  May repeat  Dose in one hour  If needed   60 tablet   0     Allergies Oxycodone; Penicillins; Prednisone; and Sulfa antibiotics  Family History  Problem Relation Age of Onset  . Diabetes Mother   . Heart disease Father     Social History Social History  Substance Use Topics  . Smoking status: Former Smoker    Quit date: 04/04/1971  . Smokeless tobacco: Never Used  . Alcohol Use: No    Review of Systems Caveat secondary to patient with altered mental status at this time.  ____________________________________________   PHYSICAL EXAM:  VITAL SIGNS: ED Triage Vitals  Enc Vitals Group     BP --      Pulse --      Resp --      Temp --      Temp src --  SpO2 --      Weight --      Height --      Head Cir --      Peak Flow --      Pain Score 05/05/15 1428 0     Pain Loc --      Pain Edu? --      Excl. in [place]? --     Constitutional: Alert and oriented 1. A large amount of yellow vomit in her  room. Eyes: Conjunctivae are normal. PERRL. EOMI. Head: Atraumatic. Nose: No congestion/rhinnorhea. Mouth/Throat: Mucous membranes are moist.  Neck: No stridor.  No tenderness palpation. Ranges neck freely without any signs of pain. Cardiovascular: Normal rate, regular rhythm. Grossly normal heart sounds.  Good peripheral circulation. Respiratory: Normal respiratory effort.  No retractions. Lungs CTAB. Gastrointestinal: Soft and nontender. No distention. No abdominal bruits. No CVA tenderness. Musculoskeletal: No lower extremity tenderness nor edema.  No joint effusions. Neurologic:  Weekly says her name. However, does not respond other questions. Able to move all 4 extremities when asked however with only 2-3 out of 10 strength.  Skin:  Skin is warm, dry and intact. No rash noted.   ____________________________________________   LABS (all labs ordered are listed, but only abnormal results are displayed)  Labs Reviewed  CBC WITH DIFFERENTIAL/PLATELET - Abnormal; Notable for the following:    WBC 13.9 (*)    RDW 16.9 (*)    Neutro Abs 7.8 (*)    Lymphs Abs 4.3 (*)    Monocytes Absolute 1.3 (*)    Basophils Absolute 0.2 (*)    All other components within normal limits  URINALYSIS COMPLETEWITH MICROSCOPIC ([facility] ONLY)  BASIC METABOLIC PANEL  TROPONIN I  APTT  PROTIME-INR   ____________________________________________  EKG  ED ECG REPORT I, Doran Stabler, the attending physician, personally viewed and interpreted this ECG.   Date: 05/05/2015  EKG Time: 1427  Rate: 88  Rhythm: normal sinus rhythm with sinus arrhythmia  Axis: Normal axis  Intervals:none  ST&T Change: No ST segment elevation or depression. No abnormal T-wave inversion.  ____________________________________________  RADIOLOGY  CT head without any acute intracranial findings. ____________________________________________   PROCEDURES    ____________________________________________   INITIAL  IMPRESSION / ASSESSMENT AND PLAN / ED COURSE  Pertinent labs & imaging results that were available during my care of the patient were reviewed by me and considered in my medical decision making (see chart for details).  ----------------------------------------- 3:46 PM on 05/05/2015 -----------------------------------------  I discussed the patient's CODE STATUS with the patient's son as well as granddaughters. The son says that the patient previously had a DO NOT RESUSCITATE order. However, he says that he thinks that she would like to be intubated if there was a possibility of reversing this and she would not be committed to being dependent on a breathing machine for the rest of her life. Stroke alert was called on the patient although after finding out that the patient takes apixaban she is not a candidate for TPA. However, I do believe that this episode could represent a CVA.  ----------------------------------------- 4:19 PM on 05/05/2015 -----------------------------------------  Patient is more awake and alert at this time and is now able to answer simple questions. I updated about results at this time. They're concerned about a fall and said that the event may not been witnessed. We will add on a CT of the cervical spine as well as an x-ray of the pelvis.  Signed out to Dr. Thomasene Lot.  ____________________________________________   FINAL CLINICAL IMPRESSION(S) / ED DIAGNOSES  Acute vomiting with syncope and altered mental status.    Orbie Pyo, MD 05/05/15 [phone]

## 2015-05-05 NOTE — ED Notes (Signed)
Per CT, extraasation in IV of left AC occurred during CT angio. Decision made by radiologist to d/c CT and do MRI tomorrow. Hot compresses applied to area after IV removed.

## 2015-05-05 NOTE — ED Provider Notes (Signed)
-----------------------------------------   4:37 PM on 05/05/2015 -----------------------------------------  Accepted signout from Dr. Dineen Kid. Patient had a syncopal episode and ongoing altered mental status. Her head CT shows no acute changes. She does have a bit of an elevation in her white blood cell count 13.9 with normal hemoglobin at 12.1. Glucose is 236.  A "specialist on-call" neurology consult and a urinalysis is pending.  ----------------------------------------- 5:29 PM on 05/05/2015 -----------------------------------------  Dr. Dineen Kid at scene the report from the specialist on-call prior to his departure. It calls for a CT angios of head and neck with contrast. I have not ordered this study.  ----------------------------------------- 6:28 PM on 05/05/2015 -----------------------------------------  The patient had a small load of contrast injected, yet no contrast showed on the initial films of the CTA. The contrast appears to have infiltrated. I was called to see the patient in the CT area. Her arm appeared okay, without significant swelling and no tenderness. She is alert and communicative with me and overall appropriate.  The patient is a GFR of 34. Given that she may receive the partial injection of IV contrast and her low renal function, we will hold off on performing the CTA. This is after discussion with the radiologist who advises that the CTA will tell us very little given her extensive white matter disease.  I discussed the situation and the reasons for admission with Dr. Tor Netters of the hospitalist service and we will admit her for further evaluation.  Final diagnosis: Syncope Altered mental status Vomiting   Ahmed Prima, MD 05/05/15 [phone]

## 2015-05-05 NOTE — ED Notes (Signed)
Pt resting comfortably with family in room/

## 2015-05-06 ENCOUNTER — Observation Stay: Payer: PPO

## 2015-05-06 ENCOUNTER — Observation Stay
Admit: 2015-05-06 | Discharge: 2015-05-06 | Disposition: A | Discharge status: Home / Self Care | Payer: PPO | Attending: Specialist | Admitting: Specialist

## 2015-05-06 LAB — BASIC METABOLIC PANEL
ANION GAP: 7 (ref 5–15)
BUN: 16 mg/dL (ref 6–20)
CALCIUM: 8.4 mg/dL — AB (ref 8.9–10.3)
CO2: 26 mmol/L (ref 22–32)
Chloride: 110 mmol/L (ref 101–111)
Creatinine, Ser: 1.05 mg/dL — ABNORMAL HIGH (ref 0.44–1.00)
GFR, EST AFRICAN AMERICAN: 52 mL/min — AB (ref >60)
GFR, EST NON AFRICAN AMERICAN: 45 mL/min — AB (ref >60)
GLUCOSE: 104 mg/dL — AB (ref 65–99)
Potassium: 3.6 mmol/L (ref 3.5–5.1)
Sodium: 143 mmol/L (ref 135–145)

## 2015-05-06 LAB — CBC
HCT: 31.3 % — ABNORMAL LOW (ref 35.0–47.0)
HEMOGLOBIN: 10.2 g/dL — AB (ref 12.0–16.0)
MCH: 27 pg (ref 26.0–34.0)
MCHC: 32.7 g/dL (ref 32.0–36.0)
MCV: 82.6 fL (ref 80.0–100.0)
Platelets: 292 10*3/uL (ref 150–440)
RBC: 3.79 MIL/uL — ABNORMAL LOW (ref 3.80–5.20)
RDW: 16.8 % — ABNORMAL HIGH (ref 11.5–14.5)
WBC: 14.1 10*3/uL — ABNORMAL HIGH (ref 3.6–11.0)

## 2015-05-06 MED ORDER — INFLUENZA VAC SPLIT QUAD 0.5 ML IM SUSY
0.5000 mL | PREFILLED_SYRINGE | INTRAMUSCULAR | Status: AC
  Administered 2015-05-07: 0.5 mL via INTRAMUSCULAR
  Filled 2015-05-06: qty 0.5

## 2015-05-06 MED ORDER — MEROPENEM 1 G IV SOLR
1.0000 g | Freq: Two times a day (BID) | INTRAVENOUS | Status: DC
  Administered 2015-05-06 – 2015-05-10 (×8): 1 g via INTRAVENOUS
  Filled 2015-05-06 (×9): qty 1

## 2015-05-06 MED ORDER — CIPROFLOXACIN IN D5W 400 MG/200ML IV SOLN
400.0000 mg | Freq: Two times a day (BID) | INTRAVENOUS | Status: DC
  Administered 2015-05-06: 400 mg via INTRAVENOUS
  Filled 2015-05-06 (×2): qty 200

## 2015-05-06 NOTE — Consult Note (Signed)
Reason for Consult: syncope Referring Physician:  Dr. Larene Beach is an 81 y.o. female.  HPI:  Evelyn [name] presents to [facility] after passing out at [facility].  Pt does not remember episode at all but does state that Evelyn [name] had some bowel incontinence.  Evelyn [name] felt nauseated and sick prior to passing out.  Per family, Evelyn [name] was walking out when Evelyn [name] didn't feel good so Evelyn [name] leaned against wall and EMS was called.  Per EMS, Evelyn [name] never passed out but there is a report that Evelyn [name] did pass out in ER. Pt did have some confusion as well but this happens whenever Evelyn [name] gets UTIs.  Evelyn [name] did not have any tongue biting or incontinence.  Past Medical History  Diagnosis Date  . Hyperlipidemia   . Thyroid disease     had thyroid removed  . Other chronic cystitis     Radiation , botox injections  . History of lymphoma   . Self-catheterizes urinary bladder     twice daily  . Coronary artery disease   . Coarse tremors     head shakes if does not take propranolol  . Insomnia     takes remeron nightly  . Cancer ([place])     lymphomia  . Anemia     hx of blood transfusion    Past Surgical History  Procedure Laterality Date  . Cardiac catheterization  2005    2 cardiac stents  . Abdominal hysterectomy  1970s  . Eye surgery      bilateral cataracts  . Joint replacement  2012    right hip  . Stomach surgery      removal all but small portion of stomach out due to ulcers  . Appendectomy    . Back surgery      lower back - bulging disk  . Spine surgery  2008    [place]  . Lumbar laminectomy/decompression microdiscectomy  01/24/2012    Procedure: LUMBAR LAMINECTOMY/DECOMPRESSION MICRODISCECTOMY 2 LEVELS;  Surgeon: Floyce Stakes, MD;  Location: [facility];  Service: Neurosurgery;  Laterality: Right;  Right Lumbar four five, lumbar five sacral one foraminotomy   . Cystoscopy  April 2013    Family History  Problem Relation Age of Onset  . Diabetes Mother   . Heart disease Father     Social  History:  reports that Evelyn [name] quit smoking about 44 years ago. Evelyn [name] has never used smokeless tobacco. Evelyn [name] reports that Evelyn [name] does not drink alcohol or use illicit drugs.  Allergies:  Allergies  Allergen Reactions  . Oxycodone Nausea Only  . Penicillins Swelling, Rash and Other (See Comments)    Pt is unable to answer additional questions about this medication.    . Prednisone Rash  . Sulfa Antibiotics Swelling and Rash    Medications: personally reviewed by me as per chart  Results for orders placed or performed during the hospital encounter of 05/05/15 (from the past 48 hour(s))  CBC with Differential     Status: Abnormal   Collection Time: 05/05/15  2:59 PM  Result Value Ref Range   WBC 13.9 (H) 3.6 - 11.0 K/uL   RBC 4.39 3.80 - 5.20 MIL/uL   Hemoglobin 12.1 12.0 - 16.0 g/dL   HCT 37.1 35.0 - 47.0 %   MCV 84.5 80.0 - 100.0 fL   MCH 27.5 26.0 - 34.0 pg   MCHC 32.6 32.0 - 36.0 g/dL   RDW 16.9 (H) 11.5 - 14.5 %   Platelets  337 150 - 440 K/uL   Neutrophils Relative % 57 %   Neutro Abs 7.8 (H) 1.4 - 6.5 K/uL   Lymphocytes Relative 31 %   Lymphs Abs 4.3 (H) 1.0 - 3.6 K/uL   Monocytes Relative 9 %   Monocytes Absolute 1.3 (H) 0.2 - 0.9 K/uL   Eosinophils Relative 2 %   Eosinophils Absolute 0.3 0 - 0.7 K/uL   Basophils Relative 1 %   Basophils Absolute 0.2 (H) 0 - 0.1 K/uL  Basic metabolic panel     Status: Abnormal   Collection Time: 05/05/15  2:59 PM  Result Value Ref Range   Sodium 142 135 - 145 mmol/L   Potassium 3.5 3.5 - 5.1 mmol/L   Chloride 108 101 - 111 mmol/L   CO2 23 22 - 32 mmol/L   Glucose, Bld 236 (H) 65 - 99 mg/dL   BUN 17 6 - 20 mg/dL   Creatinine, Ser 1.33 (H) 0.44 - 1.00 mg/dL   Calcium 8.4 (L) 8.9 - 10.3 mg/dL   GFR calc non Af Amer 34 (L) >60 mL/min   GFR calc Af Amer 39 (L) >60 mL/min    Comment: (NOTE) The eGFR has been calculated using the CKD EPI equation. This calculation has not been validated in all clinical situations. eGFR's persistently <60  mL/min signify possible Chronic Kidney Disease.    Anion gap 11 5 - 15  Troponin I     Status: None   Collection Time: 05/05/15  2:59 PM  Result Value Ref Range   Troponin I <0.03 <0.031 ng/mL    Comment:        NO INDICATION OF MYOCARDIAL INJURY.   Urinalysis complete, with microscopic ([facility] only)     Status: Abnormal   Collection Time: 05/05/15  3:55 PM  Result Value Ref Range   Color, Urine YELLOW (A) YELLOW   APPearance HAZY (A) CLEAR   Glucose, UA NEGATIVE NEGATIVE mg/dL   Bilirubin Urine NEGATIVE NEGATIVE   Ketones, ur TRACE (A) NEGATIVE mg/dL   Specific Gravity, Urine 1.014 1.005 - 1.030   Hgb urine dipstick NEGATIVE NEGATIVE   pH 5.0 5.0 - 8.0   Protein, ur NEGATIVE NEGATIVE mg/dL   Nitrite POSITIVE (A) NEGATIVE   Leukocytes, UA 2+ (A) NEGATIVE   RBC / HPF 0-5 0 - 5 RBC/hpf   WBC, UA 6-30 0 - 5 WBC/hpf   Bacteria, UA MANY (A) NONE SEEN   Squamous Epithelial / LPF 0-5 (A) NONE SEEN   WBC Clumps PRESENT    Mucous PRESENT    Hyaline Casts, UA PRESENT    Amorphous Crystal PRESENT   APTT     Status: None   Collection Time: 05/05/15  3:55 PM  Result Value Ref Range   aPTT 25 24 - 36 seconds  Protime-INR     Status: None   Collection Time: 05/05/15  3:55 PM  Result Value Ref Range   Prothrombin Time 13.4 11.4 - 15.0 seconds   INR 2.83   Basic metabolic panel     Status: Abnormal   Collection Time: 05/06/15  4:36 AM  Result Value Ref Range   Sodium 143 135 - 145 mmol/L   Potassium 3.6 3.5 - 5.1 mmol/L   Chloride 110 101 - 111 mmol/L   CO2 26 22 - 32 mmol/L   Glucose, Bld 104 (H) 65 - 99 mg/dL   BUN 16 6 - 20 mg/dL   Creatinine, Ser 1.05 (H) 0.44 - 1.00 mg/dL  Calcium 8.4 (L) 8.9 - 10.3 mg/dL   GFR calc non Af Amer 45 (L) >60 mL/min   GFR calc Af Amer 52 (L) >60 mL/min    Comment: (NOTE) The eGFR has been calculated using the CKD EPI equation. This calculation has not been validated in all clinical situations. eGFR's persistently <60 mL/min signify possible  Chronic Kidney Disease.    Anion gap 7 5 - 15  CBC     Status: Abnormal   Collection Time: 05/06/15  4:36 AM  Result Value Ref Range   WBC 14.1 (H) 3.6 - 11.0 K/uL   RBC 3.79 (L) 3.80 - 5.20 MIL/uL   Hemoglobin 10.2 (L) 12.0 - 16.0 g/dL   HCT 31.3 (L) 35.0 - 47.0 %   MCV 82.6 80.0 - 100.0 fL   MCH 27.0 26.0 - 34.0 pg   MCHC 32.7 32.0 - 36.0 g/dL   RDW 16.8 (H) 11.5 - 14.5 %   Platelets 292 150 - 440 K/uL    Dg Chest 1 View  05/05/2015   CLINICAL DATA:  Altered mental status  EXAM: CHEST 1 VIEW  COMPARISON:  April 15, 2014  FINDINGS: There is no edema or consolidation. Heart size and pulmonary vascularity are normal. No adenopathy. No bone lesions.  IMPRESSION: No edema or consolidation.   Electronically Signed   By: Lowella Grip III M.D.   On: 05/05/2015 15:52   Dg Pelvis 1-2 Views  05/05/2015   CLINICAL DATA:  Suspected unwitnessed fall, pelvic pain, history of prior ORIF for a right hip fracture.  EXAM: PELVIS - 1-2 VIEW  COMPARISON:  12/04/2013  FINDINGS: Previous fixation screws of the right hip for a remote fracture. Right iliac vascular stents noted. Bones are osteopenic. Pelvis and hips appear intact. No displaced fracture or diastasis. Degenerative changes of the spine and SI joints. Normal bowel gas pattern.  IMPRESSION: Stable postoperative findings.  Osteopenia  No acute osseous finding by plain radiography   Electronically Signed   By: Jerilynn Mages.  Shick M.D.   On: 05/05/2015 16:49   Ct Head Wo Contrast  05/05/2015   CLINICAL DATA:  81 year old female with altered mental status  EXAM: CT HEAD WITHOUT CONTRAST  CT NECK WITHOUT CONTRAST  TECHNIQUE: Contiguous axial images were obtained from the base of the skull through the vertex without contrast. Multidetector CT imaging of the neck was performed using the standard protocol without intravenous contrast.  COMPARISON:  CT scan of the head obtained earlier today the head  FINDINGS: CT HEAD FINDINGS  Negative for acute intracranial  hemorrhage, acute infarction, mass, mass effect, hydrocephalus or midline shift. Gray-white differentiation is preserved throughout. No interval change in the pattern of extensive periventricular, subcortical and deep white matter hypoattenuation most consistent with chronic microvascular ischemic white matter disease. Small lacunar infarcts in the left basal ganglia and right cerebellum are also again noted.  Small air-fluid level in the right maxillary antrum with mild mucoperiosteal thickening in the posterior right ethmoid air cells. Globes and orbits are symmetric and intact bilaterally. No soft tissue or calvarial abnormality. Intracranial atherosclerosis in both cavernous carotid arteries.  CT NECK FINDINGS  No acute fracture, malalignment or prevertebral soft tissue swelling. Multilevel cervical spondylosis and bilateral facet arthropathy. Mild (2 mm) anterolisthesis of C3 on C4 and C4 on C5 No focal soft tissue abnormality within the limitations of absent intravenous contrast. No suspicious adenopathy. Unremarkable CT appearance of the thyroid gland. No acute soft tissue abnormality. The lung apices are unremarkable. Atherosclerotic  calcifications noted throughout the aorta and origins of the branch vessels. Limited evaluation of the vascular structures in the absence of intravenous contrast.  IMPRESSION: CT HEAD  1. No acute intracranial abnormality. 2. No interval change compared to earlier today. CT NECK  1. No acute fracture or malalignment. 2. Multilevel degenerative disc disease and bilateral facet arthropathy. 3. No focal soft tissue abnormality, mass or adenopathy. 4. Atherosclerotic vascular calcifications.   Electronically Signed   By: Jacqulynn Cadet M.D.   On: 05/05/2015 19:31   Ct Head Wo Contrast  05/05/2015   CLINICAL DATA:  Syncope in the [employer] parking lot. Nausea and vomiting. Unresponsive. Altered mental status. Code stroke.  EXAM: CT HEAD WITHOUT CONTRAST  TECHNIQUE: Contiguous  axial images were obtained from the base of the skull through the vertex without intravenous contrast.  COMPARISON:  12/04/2013  FINDINGS: Remote lacunar infarcts in the right cerebellum, genu of the right internal capsule, and a left external capsule noted. Otherwise, the brainstem, cerebellum, cerebral peduncles, thalamus, basal ganglia, basilar cisterns, and ventricular system appear within normal limits. Periventricular white matter and corona radiata hypodensities favor chronic ischemic microvascular white matter disease. No intracranial hemorrhage, mass lesion, or acute CVA.  Chronic right maxillary, ethmoid, and right sphenoid sinusitis.  IMPRESSION: 1. No acute intracranial findings are identified. Several small remote lacunar infarcts are present and there is evidence of chronic ischemic microvascular white matter disease. 2. Chronic right maxillary, ethmoid, and right sphenoid sinusitis. These results were called by telephone at the time of interpretation on 05/05/2015 at 3:30 pm to Dr. Larae Grooms , who verbally acknowledged these results.   Electronically Signed   By: Van Clines M.D.   On: 05/05/2015 15:37   Ct Soft Tissue Neck Wo Contrast  05/05/2015   CLINICAL DATA:  80 year old female with altered mental status  EXAM: CT HEAD WITHOUT CONTRAST  CT NECK WITHOUT CONTRAST  TECHNIQUE: Contiguous axial images were obtained from the base of the skull through the vertex without contrast. Multidetector CT imaging of the neck was performed using the standard protocol without intravenous contrast.  COMPARISON:  CT scan of the head obtained earlier today the head  FINDINGS: CT HEAD FINDINGS  Negative for acute intracranial hemorrhage, acute infarction, mass, mass effect, hydrocephalus or midline shift. Gray-white differentiation is preserved throughout. No interval change in the pattern of extensive periventricular, subcortical and deep white matter hypoattenuation most consistent with chronic  microvascular ischemic white matter disease. Small lacunar infarcts in the left basal ganglia and right cerebellum are also again noted.  Small air-fluid level in the right maxillary antrum with mild mucoperiosteal thickening in the posterior right ethmoid air cells. Globes and orbits are symmetric and intact bilaterally. No soft tissue or calvarial abnormality. Intracranial atherosclerosis in both cavernous carotid arteries.  CT NECK FINDINGS  No acute fracture, malalignment or prevertebral soft tissue swelling. Multilevel cervical spondylosis and bilateral facet arthropathy. Mild (2 mm) anterolisthesis of C3 on C4 and C4 on C5 No focal soft tissue abnormality within the limitations of absent intravenous contrast. No suspicious adenopathy. Unremarkable CT appearance of the thyroid gland. No acute soft tissue abnormality. The lung apices are unremarkable. Atherosclerotic calcifications noted throughout the aorta and origins of the branch vessels. Limited evaluation of the vascular structures in the absence of intravenous contrast.  IMPRESSION: CT HEAD  1. No acute intracranial abnormality. 2. No interval change compared to earlier today. CT NECK  1. No acute fracture or malalignment. 2. Multilevel degenerative disc disease and  bilateral facet arthropathy. 3. No focal soft tissue abnormality, mass or adenopathy. 4. Atherosclerotic vascular calcifications.   Electronically Signed   By: Jacqulynn Cadet M.D.   On: 05/05/2015 19:31   US Carotid Bilateral  05/06/2015   CLINICAL DATA:  81 year old female with a history of syncope.  Cardiovascular risk factors include prior stroke/TIA.  EXAM: BILATERAL CAROTID DUPLEX ULTRASOUND  TECHNIQUE: Pearline Cables scale imaging, color Doppler and duplex ultrasound were performed of bilateral carotid and vertebral arteries in the neck.  COMPARISON:  No prior duplex  FINDINGS: Criteria: Quantification of carotid stenosis is based on velocity parameters that correlate the residual internal  carotid diameter with NASCET-based stenosis levels, using the diameter of the distal internal carotid lumen as the denominator for stenosis measurement.  The following velocity measurements were obtained:  RIGHT  ICA:  Systolic 734 cm/sec, Diastolic 17 cm/sec  CCA:  287 cm/sec  SYSTOLIC ICA/CCA RATIO:  0.6  ECA:  178 cm/sec  LEFT  ICA:  Systolic 93 cm/sec, Diastolic 26 cm/sec  CCA:  681 cm/sec  SYSTOLIC ICA/CCA RATIO:  0.6  ECA:  153 cm/sec  Right Brachial SBP: Not acquired  Left Brachial SBP: Not acquired  RIGHT CAROTID ARTERY: Mild calcifications of the right common carotid artery. Intimal thickening. Intermediate waveform maintained. Heterogeneous and partially calcified plaque at the right carotid bifurcation. No significant lumen shadowing. Low resistance waveform of the right ICA. Tortuosity of the right carotid system.  RIGHT VERTEBRAL ARTERY: Antegrade flow with low resistance waveform.  LEFT CAROTID ARTERY: Mild calcifications of the left common carotid artery. Significant intimal thickening and plaque formation of the common carotid artery. Intermediate waveform maintained. Heterogeneous and partially calcified plaque at the left carotid bifurcation without significant lumen shadowing. Low resistance waveform of the left ICA. Tortuosity of the left carotid system.  LEFT VERTEBRAL ARTERY:  Antegrade flow with low resistance waveform.  IMPRESSION: Color duplex indicates moderate heterogeneous and calcified plaque, with no hemodynamically significant stenosis by duplex criteria in the extracranial cerebrovascular circulation.  Significant intimal thickening of the bilateral common carotid artery with left greater than right plaque formation. These changes of common carotid artery have been indicated in some data sets to be associated with increased risk for future cardiovascular events.  Signed,  Dulcy Fanny. Earleen Newport, DO  [facility]  [facility]   Electronically  Signed   By: Corrie Mckusick D.O.   On: 05/06/2015 09:01    Review of Systems  Constitutional: Negative.   HENT: Negative.   Eyes: Negative.   Respiratory: Negative.   Cardiovascular: Negative.   Gastrointestinal: Positive for nausea. Negative for heartburn, vomiting, abdominal pain, diarrhea, constipation, blood in stool and melena.  Genitourinary: Negative.   Musculoskeletal: Negative.   Skin: Negative.   Neurological: Positive for dizziness and loss of consciousness. Negative for tingling, tremors, sensory change, speech change, focal weakness and seizures.  Endo/Heme/Allergies: Negative.   Psychiatric/Behavioral: Negative.    Blood pressure 120/48, pulse 82, temperature 98.3 [name] (36.8 C), temperature source Oral, resp. rate 16, height 5' 6"  (1.676 m), weight 64.411 kg (142 lb), SpO2 92 %. Physical Exam  Nursing note and vitals reviewed. Constitutional: Evelyn [name] is oriented to person, place, and time. Evelyn [name] appears well-developed and well-nourished. No distress.  HENT:  Head: Normocephalic and atraumatic.  Right Ear: External ear normal.  Left Ear: External ear normal.  Nose: Nose normal.  Mouth/Throat: Oropharynx is clear and moist.  Eyes: Conjunctivae and EOM are normal. Pupils are equal, round, and reactive  to light.  Neck: Normal range of motion. Neck supple.  Cardiovascular: Normal rate, regular rhythm, normal heart sounds and intact distal pulses.   Respiratory: Effort normal and breath sounds normal.  GI: Soft. Bowel sounds are normal.  Musculoskeletal: Normal range of motion.  Neurological: Evelyn [name] is alert and oriented to person, place, and time. Evelyn [name] has normal reflexes. Evelyn [name] displays normal reflexes. No cranial nerve deficit. Evelyn [name] exhibits normal muscle tone. Coordination normal.  Skin: Skin is warm and dry. Evelyn [name] is not diaphoretic.  Psychiatric: Evelyn [name] has a normal mood and affect.   CT of head personally reviewed by me and shows moderate white matter changes  Assessment/Plan: 1.   Syncope-  Nothing in hx sounds to be neurologic.  Even if it was a seizure, it would be provoked by UTI which would require no therapy. 2.  Encephalopathy-  Improved and likely due to UTI -  No MRI or EEG necessary -  No anti-epileptics needed now -  Treat UTI -  Would check echo -  Will sign off, please call with questions  Belanna Manring 05/06/2015, 9:18 AM

## 2015-05-06 NOTE — Evaluation (Signed)
Physical Therapy Evaluation Patient Details Name: DEALVA LAFOY MRN: [id number] DOB: 02/03/1920 Today's Date: 05/06/2015   History of Present Illness  Pt is a 81 yo female who was admitted to the hospital after syncopal episode while walking in public. At the hospital she was found to have a UTI.   Clinical Impression  Pt presents with hx of thyroid disease, CAD, coarse tremors, insomnia, cancer, and anemia. Examination reveals that pt performs bed mobility at mod assist, transfers at min assist, and ambulation of 60 ft at min assist. Pt is confused throughout session but able to follow simple commands and participate well with therapeutic demands. She has fair AROM but functional strength is lacking evident by her need of assist for mobility. Secondary to her weakness and decreased activity tolerance, she will continue to benefit from skilled PT in order for her to eventually return home, where she safely lived independently. This entire session was guided, instructed, and directly supervised by Greggory Stallion, DPT.     Follow Up Recommendations SNF    Equipment Recommendations  None recommended by PT    Recommendations for Other Services       Precautions / Restrictions Precautions Precautions: Fall Restrictions Weight Bearing Restrictions: No      Mobility  Bed Mobility Overal bed mobility: Needs Assistance Bed Mobility: Supine to Sit     Supine to sit: Mod assist     General bed mobility comments: Pt largely needs assist for her trunk to get upright into sitting. With cues she can use her hands to help scoot to EOB.   Transfers Overall transfer level: Needs assistance   Transfers: Sit to/from Stand Sit to Stand: Min assist         General transfer comment: Pt requires assist to get into standing but once into standing shows good stability. Needs cues for hand placement prior to transfer  Ambulation/Gait Ambulation/Gait assistance: Min assist Ambulation  Distance (Feet): 60 Feet Assistive device: Rolling walker (2 wheeled) Gait Pattern/deviations: Step-through pattern;Decreased step length - right;Decreased step length - left;Shuffle Gait velocity: .53 ft/s Gait velocity interpretation: <1.8 ft/sec, indicative of risk for recurrent falls General Gait Details: Pt needs assist for guiding RW occasionally in the correct direction. Pt occasionally needs a cue to stay alert to task.   Stairs            Wheelchair Mobility    Modified Rankin (Stroke Patients Only)       Balance Overall balance assessment: History of Falls                                           Pertinent Vitals/Pain Pain Assessment: No/denies pain    Home Living Family/patient expects to be discharged to:: Private residence Living Arrangements: Alone Available Help at Discharge:  (minimal help) Type of Home: House Home Access: Stairs to enter Entrance Stairs-Rails: Left Entrance Stairs-Number of Steps: 3 Home Layout: One level Home Equipment: Walker - 2 wheels      Prior Function Level of Independence: Independent with assistive device(s)         Comments: capable of indep ADLs, ambulation and driving car     Hand Dominance        Extremity/Trunk Assessment   Upper Extremity Assessment: Generalized weakness           Lower Extremity Assessment: Generalized weakness (MMT not formally assessed; weak  functional strength)         Communication   Communication: Expressive difficulties  Cognition Arousal/Alertness: Awake/alert Behavior During Therapy: WFL for tasks assessed/performed Overall Cognitive Status: Impaired/Different from baseline Area of Impairment: Following commands;Orientation Orientation Level: Place;Time     Following Commands: Follows one step commands consistently;Follows multi-step commands inconsistently            General Comments      Exercises Other Exercises Other Exercises: Pt  performed bilateral therex x 10 reps at supervision for proper technique. Exercises included: ankle pumps, SLR, hip abd, and LAQ      Assessment/Plan    PT Assessment Patient needs continued PT services  PT Diagnosis Difficulty walking;Abnormality of gait;Generalized weakness   PT Problem List Decreased strength;Decreased activity tolerance;Decreased balance;Decreased mobility;Decreased cognition;Decreased knowledge of use of DME;Decreased safety awareness;Decreased knowledge of precautions  PT Treatment Interventions DME instruction;Gait training;Functional mobility training;Stair training;Therapeutic activities;Therapeutic exercise;Balance training;Neuromuscular re-education;Patient/family education;Wheelchair mobility training;Manual techniques   PT Goals (Current goals can be found in the Care Plan section) Acute Rehab PT Goals Patient Stated Goal: none PT Goal Formulation: With patient Time For Goal Achievement: 05/20/15 Potential to Achieve Goals: Good    Frequency Min 2X/week   Barriers to discharge        Co-evaluation               End of Session Equipment Utilized During Treatment: Gait belt Activity Tolerance: Patient tolerated treatment well Patient left: in chair;with call bell/phone within reach;with chair alarm set;with family/visitor present Nurse Communication: Mobility status         Time: 0370-4888 PT Time Calculation (min) (ACUTE ONLY): 30 min   Charges:         PT G CodesJanyth Contes 03-Jun-2015, 1:48 PM Janyth Contes, SPT. [phone]

## 2015-05-06 NOTE — Clinical Social Work Note (Signed)
Clinical Social Work Assessment  Patient Details  Name: Evelyn [name] MRN: [id number] Date of Birth: 13-Sep-1919  Date of referral:  05/06/15               Reason for consult:  Facility Placement (possible SNF for rehab)                Permission sought to share information with:  Facility Sport and exercise psychologist, Family Supports Permission granted to share information::     Name::      (son [name]  [phone] hm  or [phone] cell)  Agency::     Relationship::     Contact Information:     Housing/Transportation Living arrangements for the past 2 months:  Single Family Home Source of Information:  Adult Children Patient Interpreter Needed:  None Criminal Activity/Legal Involvement Pertinent to Current Situation/Hospitalization:  No - Comment as needed Significant Relationships:  Adult Children, Significant Other, Siblings Lives with:  Self Do you feel safe going back to the place where you live?  Yes (Son believes patient will need rehab.) Need for family participation in patient care:  Yes (Comment) (patient is confused at this time, which is not baseline)  Care giving concerns:  Family is concerned with patient returning home without rehab.   Social Worker assessment / plan:  Patient is a 81 year old female, lives alone.  Patient is pleasantly confused.  Son [name] at bedside providing all information.  States this is far from patient's baseline as she is usually very sharp.  Patient uses a walker when home, cooks her own meals and likes to go out with her female friend.   Patient has three sons, two live local as her support system.     Patient was evaluated by PT, anticipates  patient will need rehab to  return to her previous level of ambulation.  Son would like CSW to start bed search in the event patient does not improve with her gait prior to discharge.  CSW will complete FL2, place a copy on the chart and fax out for available rehab beds in the event patient needs SNF at  discharge.   Employment status:  Retired Nurse, adult PT Recommendations:  [place] / Referral to community resources:  [place]  Patient/Family's Response to care:  Family would like patient to discharge to SNF.  Patient/Family's Understanding of and Emotional Response to Diagnosis, Current Treatment, and Prognosis:  Family understands patient will remain in the hospital for continued work up until medically clear by MD.    Emotional Assessment Appearance:  Appears stated age Attitude/Demeanor/Rapport:    Affect (typically observed):  Pleasant  Confused (not baseline) Orientation:  Oriented to Self, Oriented to Place Alcohol / Substance use:  Never Used Psych involvement (Current and /or in the community):  No (Comment)  Discharge Needs  Concerns to be addressed:  Denies Needs/Concerns at this time Readmission within the last 30 days:  No Current discharge risk:  Dependent with Mobility, Lives alone Barriers to Discharge:  No Barriers Identified   Maurine Cane, LCSW 05/06/2015, 1:56 PM Casimer Lanius. Latanya Presser, MSW Clinical Social Work Department Emergency Room [phone] 2:07 PM

## 2015-05-06 NOTE — Progress Notes (Signed)
Inpatient Diabetes Program Recommendations  AACE/ADA: New Consensus Statement on Inpatient Glycemic Control (2015)  Target Ranges:  Prepandial:   less than 140 mg/dL      Peak postprandial:   less than 180 mg/dL (1-2 hours)      Critically ill patients:  140 - 180 mg/dL   Review of Glycemic Control  Diabetes history: none Outpatient Diabetes medications: none Current orders for Inpatient glycemic control: none  Inpatient Diabetes Program Recommendations:   Lab glucose 236mg /dl on this admission.  A1C from 03/23/14-  6.1%  Consider checking a recent A1C to determine recent blood sugar control and ordering CBG tid.  Gentry Fitz, RN, BA, MHA, CDE Diabetes Coordinator Inpatient Diabetes Program  [phone] (Team Pager) [phone] ([facility]) 05/06/2015 10:32 AM

## 2015-05-06 NOTE — Progress Notes (Signed)
Patient ID: Evelyn [name], female   DOB: [birth year], 81 y.o.   MRN: [id number] [facility] PROGRESS NOTE  PCP: Crecencio Mc, MD  HPI/Subjective: Patient was feeling okay. As per the son that she has been very confused. The patient was pretty weak and working with physical therapy. This morning I saw that she had a positive urinalysis and started antibiotics for acute cystitis.  Objective: Filed Vitals:   05/06/15 1243  BP: 117/56  Pulse: 70  Temp: 97.9 F (36.6 C)  Resp: 18    Filed Weights   05/05/15 1637  Weight: 64.411 kg (142 lb)    ROS: Review of Systems  Constitutional: Positive for malaise/fatigue. Negative for fever and chills.  Eyes: Negative for blurred vision.  Respiratory: Negative for cough and shortness of breath.   Cardiovascular: Negative for chest pain.  Gastrointestinal: Negative for nausea, vomiting, abdominal pain, diarrhea and constipation.  Genitourinary: Positive for frequency. Negative for dysuria.  Musculoskeletal: Negative for joint pain.  Neurological: Positive for weakness. Negative for dizziness and headaches.   Exam: Physical Exam  HENT:  Nose: No mucosal edema.  Mouth/Throat: No oropharyngeal exudate or posterior oropharyngeal edema.  Eyes: Conjunctivae, EOM and lids are normal. Pupils are equal, round, and reactive to light.  Neck: No JVD present. Carotid bruit is not present. No edema present. No thyroid mass and no thyromegaly present.  Cardiovascular: S1 normal and S2 normal.  Exam reveals no gallop.   Murmur heard.  Systolic murmur is present with a grade of 2/6  Pulses:      Dorsalis pedis pulses are 2+ on the right side, and 2+ on the left side.  Respiratory: No respiratory distress. She has no wheezes. She has no rhonchi. She has no rales.  GI: Soft. Bowel sounds are normal. There is no tenderness.  Musculoskeletal:       Right ankle: She exhibits swelling.       Left ankle: She exhibits swelling.   Lymphadenopathy:    She has no cervical adenopathy.  Neurological: She is alert. No cranial nerve deficit.  Skin: Skin is warm. No rash noted. Nails show no clubbing.  Psychiatric: She has a normal mood and affect.    Data Reviewed: Basic Metabolic Panel:  Recent Labs Lab 05/05/15 1459 05/06/15 0436  NA 142 143  K 3.5 3.6  CL 108 110  CO2 23 26  GLUCOSE 236* 104*  BUN 17 16  CREATININE 1.33* 1.05*  CALCIUM 8.4* 8.4*   CBC:  Recent Labs Lab 05/05/15 1459 05/06/15 0436  WBC 13.9* 14.1*  NEUTROABS 7.8*  --   HGB 12.1 10.2*  HCT 37.1 31.3*  MCV 84.5 82.6  PLT 337 292    Studies: Dg Chest 1 View  05/05/2015   CLINICAL DATA:  Altered mental status  EXAM: CHEST 1 VIEW  COMPARISON:  April 15, 2014  FINDINGS: There is no edema or consolidation. Heart size and pulmonary vascularity are normal. No adenopathy. No bone lesions.  IMPRESSION: No edema or consolidation.   Electronically Signed   By: Lowella Grip III M.D.   On: 05/05/2015 15:52   Dg Pelvis 1-2 Views  05/05/2015   CLINICAL DATA:  Suspected unwitnessed fall, pelvic pain, history of prior ORIF for a right hip fracture.  EXAM: PELVIS - 1-2 VIEW  COMPARISON:  12/04/2013  FINDINGS: Previous fixation screws of the right hip for a remote fracture. Right iliac vascular stents noted. Bones are osteopenic. Pelvis and hips appear intact. No  displaced fracture or diastasis. Degenerative changes of the spine and SI joints. Normal bowel gas pattern.  IMPRESSION: Stable postoperative findings.  Osteopenia  No acute osseous finding by plain radiography   Electronically Signed   By: Jerilynn Mages.  Shick M.D.   On: 05/05/2015 16:49   Ct Head Wo Contrast  05/05/2015   CLINICAL DATA:  81 year old female with altered mental status  EXAM: CT HEAD WITHOUT CONTRAST  CT NECK WITHOUT CONTRAST  TECHNIQUE: Contiguous axial images were obtained from the base of the skull through the vertex without contrast. Multidetector CT imaging of the neck was  performed using the standard protocol without intravenous contrast.  COMPARISON:  CT scan of the head obtained earlier today the head  FINDINGS: CT HEAD FINDINGS  Negative for acute intracranial hemorrhage, acute infarction, mass, mass effect, hydrocephalus or midline shift. Gray-white differentiation is preserved throughout. No interval change in the pattern of extensive periventricular, subcortical and deep white matter hypoattenuation most consistent with chronic microvascular ischemic white matter disease. Small lacunar infarcts in the left basal ganglia and right cerebellum are also again noted.  Small air-fluid level in the right maxillary antrum with mild mucoperiosteal thickening in the posterior right ethmoid air cells. Globes and orbits are symmetric and intact bilaterally. No soft tissue or calvarial abnormality. Intracranial atherosclerosis in both cavernous carotid arteries.  CT NECK FINDINGS  No acute fracture, malalignment or prevertebral soft tissue swelling. Multilevel cervical spondylosis and bilateral facet arthropathy. Mild (2 mm) anterolisthesis of C3 on C4 and C4 on C5 No focal soft tissue abnormality within the limitations of absent intravenous contrast. No suspicious adenopathy. Unremarkable CT appearance of the thyroid gland. No acute soft tissue abnormality. The lung apices are unremarkable. Atherosclerotic calcifications noted throughout the aorta and origins of the branch vessels. Limited evaluation of the vascular structures in the absence of intravenous contrast.  IMPRESSION: CT HEAD  1. No acute intracranial abnormality. 2. No interval change compared to earlier today. CT NECK  1. No acute fracture or malalignment. 2. Multilevel degenerative disc disease and bilateral facet arthropathy. 3. No focal soft tissue abnormality, mass or adenopathy. 4. Atherosclerotic vascular calcifications.   Electronically Signed   By: Jacqulynn Cadet M.D.   On: 05/05/2015 19:31   Ct Head Wo  Contrast  05/05/2015   CLINICAL DATA:  Syncope in the [employer] parking lot. Nausea and vomiting. Unresponsive. Altered mental status. Code stroke.  EXAM: CT HEAD WITHOUT CONTRAST  TECHNIQUE: Contiguous axial images were obtained from the base of the skull through the vertex without intravenous contrast.  COMPARISON:  12/04/2013  FINDINGS: Remote lacunar infarcts in the right cerebellum, genu of the right internal capsule, and a left external capsule noted. Otherwise, the brainstem, cerebellum, cerebral peduncles, thalamus, basal ganglia, basilar cisterns, and ventricular system appear within normal limits. Periventricular white matter and corona radiata hypodensities favor chronic ischemic microvascular white matter disease. No intracranial hemorrhage, mass lesion, or acute CVA.  Chronic right maxillary, ethmoid, and right sphenoid sinusitis.  IMPRESSION: 1. No acute intracranial findings are identified. Several small remote lacunar infarcts are present and there is evidence of chronic ischemic microvascular white matter disease. 2. Chronic right maxillary, ethmoid, and right sphenoid sinusitis. These results were called by telephone at the time of interpretation on 05/05/2015 at 3:30 pm to Dr. Larae Grooms , who verbally acknowledged these results.   Electronically Signed   By: Van Clines M.D.   On: 05/05/2015 15:37   Ct Soft Tissue Neck Wo Contrast  05/05/2015  CLINICAL DATA:  81 year old female with altered mental status  EXAM: CT HEAD WITHOUT CONTRAST  CT NECK WITHOUT CONTRAST  TECHNIQUE: Contiguous axial images were obtained from the base of the skull through the vertex without contrast. Multidetector CT imaging of the neck was performed using the standard protocol without intravenous contrast.  COMPARISON:  CT scan of the head obtained earlier today the head  FINDINGS: CT HEAD FINDINGS  Negative for acute intracranial hemorrhage, acute infarction, mass, mass effect, hydrocephalus or midline  shift. Gray-white differentiation is preserved throughout. No interval change in the pattern of extensive periventricular, subcortical and deep white matter hypoattenuation most consistent with chronic microvascular ischemic white matter disease. Small lacunar infarcts in the left basal ganglia and right cerebellum are also again noted.  Small air-fluid level in the right maxillary antrum with mild mucoperiosteal thickening in the posterior right ethmoid air cells. Globes and orbits are symmetric and intact bilaterally. No soft tissue or calvarial abnormality. Intracranial atherosclerosis in both cavernous carotid arteries.  CT NECK FINDINGS  No acute fracture, malalignment or prevertebral soft tissue swelling. Multilevel cervical spondylosis and bilateral facet arthropathy. Mild (2 mm) anterolisthesis of C3 on C4 and C4 on C5 No focal soft tissue abnormality within the limitations of absent intravenous contrast. No suspicious adenopathy. Unremarkable CT appearance of the thyroid gland. No acute soft tissue abnormality. The lung apices are unremarkable. Atherosclerotic calcifications noted throughout the aorta and origins of the branch vessels. Limited evaluation of the vascular structures in the absence of intravenous contrast.  IMPRESSION: CT HEAD  1. No acute intracranial abnormality. 2. No interval change compared to earlier today. CT NECK  1. No acute fracture or malalignment. 2. Multilevel degenerative disc disease and bilateral facet arthropathy. 3. No focal soft tissue abnormality, mass or adenopathy. 4. Atherosclerotic vascular calcifications.   Electronically Signed   By: Jacqulynn Cadet M.D.   On: 05/05/2015 19:31   US Carotid Bilateral  05/06/2015   CLINICAL DATA:  81 year old female with a history of syncope.  Cardiovascular risk factors include prior stroke/TIA.  EXAM: BILATERAL CAROTID DUPLEX ULTRASOUND  TECHNIQUE: Pearline Cables scale imaging, color Doppler and duplex ultrasound were performed of  bilateral carotid and vertebral arteries in the neck.  COMPARISON:  No prior duplex  FINDINGS: Criteria: Quantification of carotid stenosis is based on velocity parameters that correlate the residual internal carotid diameter with NASCET-based stenosis levels, using the diameter of the distal internal carotid lumen as the denominator for stenosis measurement.  The following velocity measurements were obtained:  RIGHT  ICA:  Systolic 656 cm/sec, Diastolic 17 cm/sec  CCA:  812 cm/sec  SYSTOLIC ICA/CCA RATIO:  0.6  ECA:  178 cm/sec  LEFT  ICA:  Systolic 93 cm/sec, Diastolic 26 cm/sec  CCA:  751 cm/sec  SYSTOLIC ICA/CCA RATIO:  0.6  ECA:  153 cm/sec  Right Brachial SBP: Not acquired  Left Brachial SBP: Not acquired  RIGHT CAROTID ARTERY: Mild calcifications of the right common carotid artery. Intimal thickening. Intermediate waveform maintained. Heterogeneous and partially calcified plaque at the right carotid bifurcation. No significant lumen shadowing. Low resistance waveform of the right ICA. Tortuosity of the right carotid system.  RIGHT VERTEBRAL ARTERY: Antegrade flow with low resistance waveform.  LEFT CAROTID ARTERY: Mild calcifications of the left common carotid artery. Significant intimal thickening and plaque formation of the common carotid artery. Intermediate waveform maintained. Heterogeneous and partially calcified plaque at the left carotid bifurcation without significant lumen shadowing. Low resistance waveform of the left ICA. Tortuosity of  the left carotid system.  LEFT VERTEBRAL ARTERY:  Antegrade flow with low resistance waveform.  IMPRESSION: Color duplex indicates moderate heterogeneous and calcified plaque, with no hemodynamically significant stenosis by duplex criteria in the extracranial cerebrovascular circulation.  Significant intimal thickening of the bilateral common carotid artery with left greater than right plaque formation. These changes of common carotid artery have been indicated in  some data sets to be associated with increased risk for future cardiovascular events.  Signed,  Dulcy Fanny. Earleen Newport, DO  [facility]  [facility]   Electronically Signed   By: Corrie Mckusick D.O.   On: 05/06/2015 09:01    Scheduled Meds: . apixaban  2.5 mg Oral BID  . ciprofloxacin  400 mg Intravenous Q12H  . [START ON 05/07/2015] Influenza vac split quadrivalent PF  0.5 mL Intramuscular Tomorrow-1000  . levothyroxine  100 mcg Oral QAC breakfast  . multivitamin with minerals  1 tablet Oral Daily  . pantoprazole  40 mg Oral Daily  . propranolol  10 mg Oral BID  . sodium chloride  3 mL Intravenous Q12H  . URELLE  81 mg Oral TID  . vitamin B-12  1,000 mcg Oral Daily  . Vitamin D (Ergocalciferol)  50,000 Units Oral Q7 days   Assessment/Plan:  1. Acute encephalopathy. Syncope and collapse. Patient still has some confusion as per the son. She is answering questions appropriately. She is complementing everything about being in the hospital and as per the son this is not her personality usually. She was seen in consultation by neurology who believed that this was not a stroke and canceled the MRI of the brain. 2. Acute cystitis without hematuria. I saw the positive urinalysis this morning and added IV Cipro. Of note, she did have a history of multidrug resistant urinary tract infections. We will probably have to wait for the identification and sensitivities prior to discharge. I will switch the antibiotics to IV Invanz. 3. Hypothyroidism unspecified continue levothyroxine 4. History of DVT- on Eliquis 5. Essential hypertension on propranolol 6. Gastroesophageal reflux disease on Protonix.  Code Status:     Code Status Orders        Start     Ordered   05/05/15 2048  Full code   Continuous     05/05/15 2048     Family Communication: Son at the bedside Disposition Plan: PT initially recommended rehabilitation today  Consultants:  PT  Time  spent: 30 minutes earlier  [place], [place] [facility]

## 2015-05-06 NOTE — Progress Notes (Addendum)
Patient has been alert and oriented today until late afternoon when she became somewhat confused and keeps stating "I need to get my car and go home". IV antibiotics given for UTI. No complaints of pain. Vitals are stable. Orthostatic vitals negative. Patient able to ambulate with walker in room. Sinus rhythm with 1st degree heart block. Will continue to monitor.

## 2015-05-06 NOTE — Progress Notes (Signed)
*  PRELIMINARY RESULTS* Echocardiogram 2D Echocardiogram has been performed.  Evelyn [name] 05/06/2015, 8:09 AM

## 2015-05-06 NOTE — Progress Notes (Signed)
ANTIBIOTIC CONSULT NOTE - INITIAL  Pharmacy Consult for meropenem  Indication: UTI  Allergies  Allergen Reactions  . Oxycodone Nausea Only  . Penicillins Swelling, Rash and Other (See Comments)    Pt is unable to answer additional questions about this medication.    . Prednisone Rash  . Sulfa Antibiotics Swelling and Rash    Patient Measurements: Height: 5\' 6"  (167.6 cm) Weight: 142 lb (64.411 kg) IBW/kg (Calculated) : 59.3  Vital Signs: Temp: 97.9 F (36.6 C) (10/06 1243) Temp Source: Oral (10/06 1243) BP: 117/56 mmHg (10/06 1243) Pulse Rate: 70 (10/06 1243) Intake/Output from previous day:   Intake/Output from this shift: Total I/O In: 3 [I.V.:3] Out: 100 [Urine:100]  Labs:  Recent Labs  05/05/15 1459 05/06/15 0436  WBC 13.9* 14.1*  HGB 12.1 10.2*  PLT 337 292  CREATININE 1.33* 1.05*   Estimated Creatinine Clearance: 32 mL/min (by C-G formula based on Cr of 1.05). No results for input(s): VANCOTROUGH, VANCOPEAK, VANCORANDOM, GENTTROUGH, GENTPEAK, GENTRANDOM, TOBRATROUGH, TOBRAPEAK, TOBRARND, AMIKACINPEAK, AMIKACINTROU, AMIKACIN in the last 72 hours.   Microbiology: No results found for this or any previous visit (from the past 720 hour(s)).  Medical History: Past Medical History  Diagnosis Date  . Hyperlipidemia   . Thyroid disease     had thyroid removed  . Other chronic cystitis     Radiation , botox injections  . History of lymphoma   . Self-catheterizes urinary bladder     twice daily  . Coronary artery disease   . Coarse tremors     head shakes if does not take propranolol  . Insomnia     takes remeron nightly  . Cancer ([place])     lymphomia  . Anemia     hx of blood transfusion    Medications:  Anti-infectives    Start     Dose/Rate Route Frequency Ordered Stop   05/06/15 1630  meropenem (MERREM) 1 g in sodium chloride 0.9 % 100 mL IVPB     1 g 200 mL/hr over 30 Minutes Intravenous Every 12 hours 05/06/15 1520     05/06/15 0715   ciprofloxacin (CIPRO) IVPB 400 mg  Status:  Discontinued     400 mg 200 mL/hr over 60 Minutes Intravenous Every 12 hours 05/06/15 0714 05/06/15 1520     Assessment: Meropenem dose entered for 1g q8h hours for UTI in this patient with a history of multi-drug resistant UTI. Initially started on ciprofloxacin, but changed to meropenem this afternoon.  Plan:  Changed dose to meropenem 1g IV q12h based on patients renal function (estimated CrCl ~32 mL/min). Pharmacy will monitor renal function and adjust dose if necessary.  Darylene Price Damond Borchers 05/06/2015,3:31 PM

## 2015-05-07 ENCOUNTER — Encounter
Admission: RE | Admit: 2015-05-07 | Discharge: 2015-05-07 | Disposition: A | Discharge status: Home / Self Care | Payer: PPO | Source: Ambulatory Visit | Attending: Internal Medicine | Admitting: Internal Medicine

## 2015-05-07 DIAGNOSIS — Z9889 Other specified postprocedural states: Secondary | ICD-10-CM | POA: Diagnosis not present

## 2015-05-07 DIAGNOSIS — B9620 Unspecified Escherichia coli [E. coli] as the cause of diseases classified elsewhere: Secondary | ICD-10-CM | POA: Diagnosis present

## 2015-05-07 DIAGNOSIS — I10 Essential (primary) hypertension: Secondary | ICD-10-CM | POA: Diagnosis present

## 2015-05-07 DIAGNOSIS — N3000 Acute cystitis without hematuria: Secondary | ICD-10-CM | POA: Diagnosis present

## 2015-05-07 DIAGNOSIS — G9340 Encephalopathy, unspecified: Secondary | ICD-10-CM | POA: Insufficient documentation

## 2015-05-07 DIAGNOSIS — Z8673 Personal history of transient ischemic attack (TIA), and cerebral infarction without residual deficits: Secondary | ICD-10-CM | POA: Diagnosis not present

## 2015-05-07 DIAGNOSIS — E039 Hypothyroidism, unspecified: Secondary | ICD-10-CM | POA: Diagnosis present

## 2015-05-07 DIAGNOSIS — G4700 Insomnia, unspecified: Secondary | ICD-10-CM | POA: Diagnosis present

## 2015-05-07 DIAGNOSIS — Z955 Presence of coronary angioplasty implant and graft: Secondary | ICD-10-CM | POA: Diagnosis not present

## 2015-05-07 DIAGNOSIS — R55 Syncope and collapse: Secondary | ICD-10-CM | POA: Diagnosis present

## 2015-05-07 DIAGNOSIS — Z9049 Acquired absence of other specified parts of digestive tract: Secondary | ICD-10-CM | POA: Diagnosis not present

## 2015-05-07 DIAGNOSIS — Z880 Allergy status to penicillin: Secondary | ICD-10-CM | POA: Diagnosis not present

## 2015-05-07 DIAGNOSIS — N390 Urinary tract infection, site not specified: Secondary | ICD-10-CM | POA: Diagnosis present

## 2015-05-07 DIAGNOSIS — E785 Hyperlipidemia, unspecified: Secondary | ICD-10-CM | POA: Diagnosis present

## 2015-05-07 DIAGNOSIS — K219 Gastro-esophageal reflux disease without esophagitis: Secondary | ICD-10-CM | POA: Diagnosis present

## 2015-05-07 DIAGNOSIS — Z888 Allergy status to other drugs, medicaments and biological substances status: Secondary | ICD-10-CM | POA: Diagnosis not present

## 2015-05-07 DIAGNOSIS — I2510 Atherosclerotic heart disease of native coronary artery without angina pectoris: Secondary | ICD-10-CM | POA: Diagnosis present

## 2015-05-07 DIAGNOSIS — Z87891 Personal history of nicotine dependence: Secondary | ICD-10-CM | POA: Diagnosis not present

## 2015-05-07 DIAGNOSIS — Z96641 Presence of right artificial hip joint: Secondary | ICD-10-CM | POA: Diagnosis present

## 2015-05-07 DIAGNOSIS — Z86718 Personal history of other venous thrombosis and embolism: Secondary | ICD-10-CM | POA: Diagnosis not present

## 2015-05-07 DIAGNOSIS — Z8249 Family history of ischemic heart disease and other diseases of the circulatory system: Secondary | ICD-10-CM | POA: Diagnosis not present

## 2015-05-07 DIAGNOSIS — Z882 Allergy status to sulfonamides status: Secondary | ICD-10-CM | POA: Diagnosis not present

## 2015-05-07 DIAGNOSIS — Z8572 Personal history of non-Hodgkin lymphomas: Secondary | ICD-10-CM | POA: Diagnosis not present

## 2015-05-07 DIAGNOSIS — Z833 Family history of diabetes mellitus: Secondary | ICD-10-CM | POA: Diagnosis not present

## 2015-05-07 DIAGNOSIS — Z79899 Other long term (current) drug therapy: Secondary | ICD-10-CM | POA: Diagnosis not present

## 2015-05-07 NOTE — Progress Notes (Signed)
MD asked on rounds this morning if he wanted propanolol given, given her low blood pressures.  Filed Vitals:   05/07/15 0352  BP: 102/49  Pulse: 90  Temp: 98.5 F (36.9 C)  Resp: 18   Per Dr. Leslye Peer, hold AM propanolol.

## 2015-05-07 NOTE — Progress Notes (Signed)
Per Telemetry clerk, patients HR increased to 150s-160s, RN rounded on patient to assess, patient sitting quietly in recliner, no extemporaneous movement. MD notified and telemetry rechecked, patients HR now in 90s-100s. Per Dr. Leslye Peer, give propanolol this morning.

## 2015-05-07 NOTE — Progress Notes (Signed)
ANTIBIOTIC CONSULT NOTE - FOLLOW UP  Pharmacy Consult for meropenem Indication: UTI  Allergies  Allergen Reactions  . Oxycodone Nausea Only  . Penicillins Swelling, Rash and Other (See Comments)    Pt is unable to answer additional questions about this medication.    . Prednisone Rash  . Sulfa Antibiotics Swelling and Rash    Patient Measurements: Height: 5\' 6"  (167.6 cm) Weight: 142 lb (64.411 kg) IBW/kg (Calculated) : 59.3  Vital Signs: Temp: 98.5 F (36.9 C) (10/07 0352) Temp Source: Oral (10/07 0352) BP: 126/62 mmHg (10/07 1258) Pulse Rate: 81 (10/07 1258)  Labs:  Recent Labs  05/05/15 1459 05/06/15 0436  WBC 13.9* 14.1*  HGB 12.1 10.2*  PLT 337 292  CREATININE 1.33* 1.05*   Estimated Creatinine Clearance: 32 mL/min (by C-G formula based on Cr of 1.05).   Microbiology: Recent Results (from the past 720 hour(s))  Urine culture     Status: None (Preliminary result)   Collection Time: 05/05/15  3:55 PM  Result Value Ref Range Status   Specimen Description URINE, CLEAN CATCH  Final   Special Requests Normal  Final   Culture   Final    >=100,000 COLONIES/mL GRAM NEGATIVE RODS IDENTIFICATION AND SUSCEPTIBILITIES TO FOLLOW    Report Status PENDING  Incomplete   Assessment: Pharmacy dosing meropenem in this 81 year old female with a history of multi-drug resistant UTIs. Urine culture growing >100,000 GNR with identification and susceptibilities to follow.   Plan:  Continue with meropenem 1g IV q12 hours per renal function and indication.  Darylene Price Swayne 05/07/2015,2:24 PM

## 2015-05-07 NOTE — Progress Notes (Signed)
Patient ID: Evelyn [name], female   DOB: Jan 17, 1920, 81 y.o.   MRN: [id number] [facility] PROGRESS NOTE  PCP: Crecencio Mc, MD  HPI/Subjective: The patient does not remember me from yesterday. She did have some nausea. Sometimes she does self catheterizations at home. Sometimes she loses her urine.  Objective: Filed Vitals:   05/07/15 1258  BP: 126/62  Pulse: 81  Temp:   Resp: 18    Filed Weights   05/05/15 1637  Weight: 64.411 kg (142 lb)    ROS: Review of Systems  Constitutional: Positive for malaise/fatigue. Negative for fever and chills.  Eyes: Negative for blurred vision.  Respiratory: Negative for cough and shortness of breath.   Cardiovascular: Negative for chest pain.  Gastrointestinal: Negative for nausea, vomiting, abdominal pain, diarrhea and constipation.  Genitourinary: Positive for frequency. Negative for dysuria.  Musculoskeletal: Negative for joint pain.  Neurological: Positive for weakness. Negative for dizziness and headaches.   Exam: Physical Exam  HENT:  Nose: No mucosal edema.  Mouth/Throat: No oropharyngeal exudate or posterior oropharyngeal edema.  Eyes: Conjunctivae, EOM and lids are normal. Pupils are equal, round, and reactive to light.  Neck: No JVD present. Carotid bruit is not present. No edema present. No thyroid mass and no thyromegaly present.  Cardiovascular: S1 normal and S2 normal.  Exam reveals no gallop.   Murmur heard.  Systolic murmur is present with a grade of 2/6  Pulses:      Dorsalis pedis pulses are 2+ on the right side, and 2+ on the left side.  Respiratory: No respiratory distress. She has no wheezes. She has no rhonchi. She has no rales.  GI: Soft. Bowel sounds are normal. There is no tenderness.  Musculoskeletal:       Right ankle: She exhibits swelling.       Left ankle: She exhibits swelling.  Lymphadenopathy:    She has no cervical adenopathy.  Neurological: She is alert. No cranial nerve  deficit.  Skin: Skin is warm. No rash noted. Nails show no clubbing.  Psychiatric: She has a normal mood and affect.    Data Reviewed: Basic Metabolic Panel:  Recent Labs Lab 05/05/15 1459 05/06/15 0436  NA 142 143  K 3.5 3.6  CL 108 110  CO2 23 26  GLUCOSE 236* 104*  BUN 17 16  CREATININE 1.33* 1.05*  CALCIUM 8.4* 8.4*   CBC:  Recent Labs Lab 05/05/15 1459 05/06/15 0436  WBC 13.9* 14.1*  NEUTROABS 7.8*  --   HGB 12.1 10.2*  HCT 37.1 31.3*  MCV 84.5 82.6  PLT 337 292    Studies: Dg Chest 1 View  05/05/2015   CLINICAL DATA:  Altered mental status  EXAM: CHEST 1 VIEW  COMPARISON:  April 15, 2014  FINDINGS: There is no edema or consolidation. Heart size and pulmonary vascularity are normal. No adenopathy. No bone lesions.  IMPRESSION: No edema or consolidation.   Electronically Signed   By: Lowella Grip III M.D.   On: 05/05/2015 15:52   Dg Pelvis 1-2 Views  05/05/2015   CLINICAL DATA:  Suspected unwitnessed fall, pelvic pain, history of prior ORIF for a right hip fracture.  EXAM: PELVIS - 1-2 VIEW  COMPARISON:  12/04/2013  FINDINGS: Previous fixation screws of the right hip for a remote fracture. Right iliac vascular stents noted. Bones are osteopenic. Pelvis and hips appear intact. No displaced fracture or diastasis. Degenerative changes of the spine and SI joints. Normal bowel gas pattern.  IMPRESSION:  Stable postoperative findings.  Osteopenia  No acute osseous finding by plain radiography   Electronically Signed   By: Jerilynn Mages.  Shick M.D.   On: 05/05/2015 16:49   Ct Head Wo Contrast  05/05/2015   CLINICAL DATA:  81 year old female with altered mental status  EXAM: CT HEAD WITHOUT CONTRAST  CT NECK WITHOUT CONTRAST  TECHNIQUE: Contiguous axial images were obtained from the base of the skull through the vertex without contrast. Multidetector CT imaging of the neck was performed using the standard protocol without intravenous contrast.  COMPARISON:  CT scan of the head  obtained earlier today the head  FINDINGS: CT HEAD FINDINGS  Negative for acute intracranial hemorrhage, acute infarction, mass, mass effect, hydrocephalus or midline shift. Gray-white differentiation is preserved throughout. No interval change in the pattern of extensive periventricular, subcortical and deep white matter hypoattenuation most consistent with chronic microvascular ischemic white matter disease. Small lacunar infarcts in the left basal ganglia and right cerebellum are also again noted.  Small air-fluid level in the right maxillary antrum with mild mucoperiosteal thickening in the posterior right ethmoid air cells. Globes and orbits are symmetric and intact bilaterally. No soft tissue or calvarial abnormality. Intracranial atherosclerosis in both cavernous carotid arteries.  CT NECK FINDINGS  No acute fracture, malalignment or prevertebral soft tissue swelling. Multilevel cervical spondylosis and bilateral facet arthropathy. Mild (2 mm) anterolisthesis of C3 on C4 and C4 on C5 No focal soft tissue abnormality within the limitations of absent intravenous contrast. No suspicious adenopathy. Unremarkable CT appearance of the thyroid gland. No acute soft tissue abnormality. The lung apices are unremarkable. Atherosclerotic calcifications noted throughout the aorta and origins of the branch vessels. Limited evaluation of the vascular structures in the absence of intravenous contrast.  IMPRESSION: CT HEAD  1. No acute intracranial abnormality. 2. No interval change compared to earlier today. CT NECK  1. No acute fracture or malalignment. 2. Multilevel degenerative disc disease and bilateral facet arthropathy. 3. No focal soft tissue abnormality, mass or adenopathy. 4. Atherosclerotic vascular calcifications.   Electronically Signed   By: Jacqulynn Cadet M.D.   On: 05/05/2015 19:31   Ct Head Wo Contrast  05/05/2015   CLINICAL DATA:  Syncope in the [employer] parking lot. Nausea and vomiting. Unresponsive.  Altered mental status. Code stroke.  EXAM: CT HEAD WITHOUT CONTRAST  TECHNIQUE: Contiguous axial images were obtained from the base of the skull through the vertex without intravenous contrast.  COMPARISON:  12/04/2013  FINDINGS: Remote lacunar infarcts in the right cerebellum, genu of the right internal capsule, and a left external capsule noted. Otherwise, the brainstem, cerebellum, cerebral peduncles, thalamus, basal ganglia, basilar cisterns, and ventricular system appear within normal limits. Periventricular white matter and corona radiata hypodensities favor chronic ischemic microvascular white matter disease. No intracranial hemorrhage, mass lesion, or acute CVA.  Chronic right maxillary, ethmoid, and right sphenoid sinusitis.  IMPRESSION: 1. No acute intracranial findings are identified. Several small remote lacunar infarcts are present and there is evidence of chronic ischemic microvascular white matter disease. 2. Chronic right maxillary, ethmoid, and right sphenoid sinusitis. These results were called by telephone at the time of interpretation on 05/05/2015 at 3:30 pm to Dr. Larae Grooms , who verbally acknowledged these results.   Electronically Signed   By: Van Clines M.D.   On: 05/05/2015 15:37   Ct Soft Tissue Neck Wo Contrast  05/05/2015   CLINICAL DATA:  81 year old female with altered mental status  EXAM: CT HEAD WITHOUT CONTRAST  CT  NECK WITHOUT CONTRAST  TECHNIQUE: Contiguous axial images were obtained from the base of the skull through the vertex without contrast. Multidetector CT imaging of the neck was performed using the standard protocol without intravenous contrast.  COMPARISON:  CT scan of the head obtained earlier today the head  FINDINGS: CT HEAD FINDINGS  Negative for acute intracranial hemorrhage, acute infarction, mass, mass effect, hydrocephalus or midline shift. Gray-white differentiation is preserved throughout. No interval change in the pattern of extensive  periventricular, subcortical and deep white matter hypoattenuation most consistent with chronic microvascular ischemic white matter disease. Small lacunar infarcts in the left basal ganglia and right cerebellum are also again noted.  Small air-fluid level in the right maxillary antrum with mild mucoperiosteal thickening in the posterior right ethmoid air cells. Globes and orbits are symmetric and intact bilaterally. No soft tissue or calvarial abnormality. Intracranial atherosclerosis in both cavernous carotid arteries.  CT NECK FINDINGS  No acute fracture, malalignment or prevertebral soft tissue swelling. Multilevel cervical spondylosis and bilateral facet arthropathy. Mild (2 mm) anterolisthesis of C3 on C4 and C4 on C5 No focal soft tissue abnormality within the limitations of absent intravenous contrast. No suspicious adenopathy. Unremarkable CT appearance of the thyroid gland. No acute soft tissue abnormality. The lung apices are unremarkable. Atherosclerotic calcifications noted throughout the aorta and origins of the branch vessels. Limited evaluation of the vascular structures in the absence of intravenous contrast.  IMPRESSION: CT HEAD  1. No acute intracranial abnormality. 2. No interval change compared to earlier today. CT NECK  1. No acute fracture or malalignment. 2. Multilevel degenerative disc disease and bilateral facet arthropathy. 3. No focal soft tissue abnormality, mass or adenopathy. 4. Atherosclerotic vascular calcifications.   Electronically Signed   By: Jacqulynn Cadet M.D.   On: 05/05/2015 19:31   US Carotid Bilateral  05/06/2015   CLINICAL DATA:  81 year old female with a history of syncope.  Cardiovascular risk factors include prior stroke/TIA.  EXAM: BILATERAL CAROTID DUPLEX ULTRASOUND  TECHNIQUE: Pearline Cables scale imaging, color Doppler and duplex ultrasound were performed of bilateral carotid and vertebral arteries in the neck.  COMPARISON:  No prior duplex  FINDINGS: Criteria:  Quantification of carotid stenosis is based on velocity parameters that correlate the residual internal carotid diameter with NASCET-based stenosis levels, using the diameter of the distal internal carotid lumen as the denominator for stenosis measurement.  The following velocity measurements were obtained:  RIGHT  ICA:  Systolic 932 cm/sec, Diastolic 17 cm/sec  CCA:  355 cm/sec  SYSTOLIC ICA/CCA RATIO:  0.6  ECA:  178 cm/sec  LEFT  ICA:  Systolic 93 cm/sec, Diastolic 26 cm/sec  CCA:  732 cm/sec  SYSTOLIC ICA/CCA RATIO:  0.6  ECA:  153 cm/sec  Right Brachial SBP: Not acquired  Left Brachial SBP: Not acquired  RIGHT CAROTID ARTERY: Mild calcifications of the right common carotid artery. Intimal thickening. Intermediate waveform maintained. Heterogeneous and partially calcified plaque at the right carotid bifurcation. No significant lumen shadowing. Low resistance waveform of the right ICA. Tortuosity of the right carotid system.  RIGHT VERTEBRAL ARTERY: Antegrade flow with low resistance waveform.  LEFT CAROTID ARTERY: Mild calcifications of the left common carotid artery. Significant intimal thickening and plaque formation of the common carotid artery. Intermediate waveform maintained. Heterogeneous and partially calcified plaque at the left carotid bifurcation without significant lumen shadowing. Low resistance waveform of the left ICA. Tortuosity of the left carotid system.  LEFT VERTEBRAL ARTERY:  Antegrade flow with low resistance waveform.  IMPRESSION:  Color duplex indicates moderate heterogeneous and calcified plaque, with no hemodynamically significant stenosis by duplex criteria in the extracranial cerebrovascular circulation.  Significant intimal thickening of the bilateral common carotid artery with left greater than right plaque formation. These changes of common carotid artery have been indicated in some data sets to be associated with increased risk for future cardiovascular events.  Signed,  Dulcy Fanny.  Earleen Newport, DO  [facility]  [facility]   Electronically Signed   By: Corrie Mckusick D.O.   On: 05/06/2015 09:01    Scheduled Meds: . apixaban  2.5 mg Oral BID  . levothyroxine  100 mcg Oral QAC breakfast  . meropenem (MERREM) IV  1 g Intravenous Q12H  . multivitamin with minerals  1 tablet Oral Daily  . pantoprazole  40 mg Oral Daily  . propranolol  10 mg Oral BID  . sodium chloride  3 mL Intravenous Q12H  . URELLE  81 mg Oral TID  . vitamin B-12  1,000 mcg Oral Daily  . Vitamin D (Ergocalciferol)  50,000 Units Oral Q7 days   Assessment/Plan:  1. Acute encephalopathy. Syncope and collapse. Patient's did not recognize me from yesterday. She is answering questions appropriately. She was seen in consultation by neurology who believed that this was not a stroke and canceled the MRI of the brain. 2. Acute cystitis without hematuria. Continue IV meropenem until urine cultures come back. The patient does have a history of multidrug resistant Escherichia coli. Patient needs to be in the hospital until the identification and sensitivities are back. Hopefully we'll have those results back tomorrow. 3. Hypothyroidism unspecified continue levothyroxine 4. History of DVT- on Eliquis 5. Essential hypertension on propranolol 6. Gastroesophageal reflux disease on Protonix. 7. Weakness- PT recommended rehabilitation yesterday  Code Status:     Code Status Orders        Start     Ordered   05/05/15 2048  Full code   Continuous     05/05/15 2048     Family Communication: Son yesterday, patient states that I can speak in front of her friend today. Disposition Plan: PT initially recommended rehabilitation today  Consultants:  PT  Neurology  Time spent: 22 minutes.  Loletha Grayer  [facility]

## 2015-05-08 LAB — CBC WITH DIFFERENTIAL/PLATELET
BASOS ABS: 0.1 10*3/uL (ref 0–0.1)
BASOS PCT: 1 %
EOS PCT: 7 %
Eosinophils Absolute: 0.6 10*3/uL (ref 0–0.7)
HCT: 31.2 % — ABNORMAL LOW (ref 35.0–47.0)
Hemoglobin: 10 g/dL — ABNORMAL LOW (ref 12.0–16.0)
LYMPHS PCT: 20 %
Lymphs Abs: 1.9 10*3/uL (ref 1.0–3.6)
MCH: 27 pg (ref 26.0–34.0)
MCHC: 32.1 g/dL (ref 32.0–36.0)
MCV: 84 fL (ref 80.0–100.0)
MONO ABS: 1.1 10*3/uL — AB (ref 0.2–0.9)
Monocytes Relative: 11 %
Neutro Abs: 5.7 10*3/uL (ref 1.4–6.5)
Neutrophils Relative %: 61 %
PLATELETS: 268 10*3/uL (ref 150–440)
RBC: 3.71 MIL/uL — AB (ref 3.80–5.20)
RDW: 16.9 % — AB (ref 11.5–14.5)
WBC: 9.3 10*3/uL (ref 3.6–11.0)

## 2015-05-08 LAB — CREATININE, SERUM
CREATININE: 1.13 mg/dL — AB (ref 0.44–1.00)
GFR, EST AFRICAN AMERICAN: 47 mL/min — AB (ref >60)
GFR, EST NON AFRICAN AMERICAN: 41 mL/min — AB (ref >60)

## 2015-05-08 NOTE — Progress Notes (Signed)
Patient ID: Evelyn [name], female   DOB: [birth year], 81 y.o.   MRN: [id number] [facility] PROGRESS NOTE  PCP: Crecencio Mc, MD  HPI/Subjective: No complaint except bladder cramp today.  Objective: Filed Vitals:   05/08/15 1155  BP: 146/62  Pulse: 63  Temp: 98.2 F (36.8 C)  Resp: 17    Filed Weights   05/05/15 1637  Weight: 64.411 kg (142 lb)    ROS: Review of Systems  Constitutional: Negative for fever, chills and malaise/fatigue.  Eyes: Negative for blurred vision.  Respiratory: Negative for cough and shortness of breath.   Cardiovascular: Negative for chest pain.  Gastrointestinal: Negative for nausea, vomiting, abdominal pain, diarrhea and constipation.  Genitourinary: Positive for dysuria and frequency.  Musculoskeletal: Negative for joint pain.  Neurological: Negative for dizziness, weakness and headaches.   Exam: Physical Exam  HENT:  Nose: No mucosal edema.  Mouth/Throat: No oropharyngeal exudate or posterior oropharyngeal edema.  Eyes: Conjunctivae, EOM and lids are normal. Pupils are equal, round, and reactive to light.  Neck: No JVD present. Carotid bruit is not present. No edema present. No thyroid mass and no thyromegaly present.  Cardiovascular: S1 normal and S2 normal.  Exam reveals no gallop.   Murmur heard.  Systolic murmur is present with a grade of 2/6  Pulses:      Dorsalis pedis pulses are 2+ on the right side, and 2+ on the left side.  Respiratory: No respiratory distress. She has no wheezes. She has no rhonchi. She has no rales.  GI: Soft. Bowel sounds are normal. There is no tenderness.  Musculoskeletal:       Right ankle: She exhibits swelling.       Left ankle: She exhibits swelling.  Lymphadenopathy:    She has no cervical adenopathy.  Neurological: She is alert. No cranial nerve deficit.  Skin: Skin is warm. No rash noted. Nails show no clubbing.  Psychiatric: She has a normal mood and affect.    Data  Reviewed: Basic Metabolic Panel:  Recent Labs Lab 05/05/15 1459 05/06/15 0436 05/08/15 0542  NA 142 143  --   K 3.5 3.6  --   CL 108 110  --   CO2 23 26  --   GLUCOSE 236* 104*  --   BUN 17 16  --   CREATININE 1.33* 1.05* 1.13*  CALCIUM 8.4* 8.4*  --    CBC:  Recent Labs Lab 05/05/15 1459 05/06/15 0436 05/08/15 0542  WBC 13.9* 14.1* 9.3  NEUTROABS 7.8*  --  5.7  HGB 12.1 10.2* 10.0*  HCT 37.1 31.3* 31.2*  MCV 84.5 82.6 84.0  PLT 337 292 268    Studies: No results found.  Scheduled Meds: . apixaban  2.5 mg Oral BID  . levothyroxine  100 mcg Oral QAC breakfast  . meropenem (MERREM) IV  1 g Intravenous Q12H  . multivitamin with minerals  1 tablet Oral Daily  . pantoprazole  40 mg Oral Daily  . propranolol  10 mg Oral BID  . sodium chloride  3 mL Intravenous Q12H  . URELLE  81 mg Oral TID  . vitamin B-12  1,000 mcg Oral Daily  . Vitamin D (Ergocalciferol)  50,000 Units Oral Q7 days   Assessment/Plan:  1. Acute encephalopathy. Syncope and collapse. Improved. She was seen in consultation by neurology who believed that this was not a stroke and canceled the MRI of the brain. 2. Acute cystitis without hematuria. Continue IV meropenem and follow-up urine  cultures (GNR). The patient does have a history of multidrug resistant Escherichia coli.  3. Hypothyroidism unspecified continue levothyroxine 4. History of DVT- on Eliquis 5. Essential hypertension on propranolol 6. Gastroesophageal reflux disease on Protonix. 7. Weakness- PT recommended rehabilitation.  Code Status:     Code Status Orders        Start     Ordered   05/05/15 2048  Full code   Continuous     05/05/15 2048     Family Communication: I discussed with patient's sons and daughter-in-law. Disposition Plan: Possible discharge to skilled nursing facility in 2 days.  Consultants:  PT  Neurology  Time spent: 36 minutes.  Demetrios Loll  [facility]

## 2015-05-08 NOTE — Progress Notes (Signed)
CSW met with patient and son on 05/07/15.  Patient states would like to go to [facility] if possible.  CSW called [facility] spoke to [place] to confirm the bed offer.   Call to patient's son [name] to inform him [facility] is able to take patient when she is medically cleared by MD, if she discharges any time this weekend.  Patient will go to [id number].   RN will call report to [phone] and CSW will fax discharge packet to [phone].  Casimer Lanius. Latanya Presser, MSW Clinical Social Work Department Emergency Room [phone] 8:22 AM

## 2015-05-09 LAB — URINE CULTURE: Special Requests: NORMAL

## 2015-05-09 MED ORDER — ALUM & MAG HYDROXIDE-SIMETH 200-200-20 MG/5ML PO SUSP
30.0000 mL | Freq: Four times a day (QID) | ORAL | Status: DC | PRN
  Administered 2015-05-09: 30 mL via ORAL
  Filled 2015-05-09: qty 30

## 2015-05-09 NOTE — Progress Notes (Signed)
Spoke with dr. Bridgett Larsson to make aware patients c/o chest/ upper epigastric pain. Rates pain 8/10 states she is sob with heavy feeling. Patient currently eating and belching. Sat 97% on ra bp 135/53 hr 74 nr. Per md order maalox prn. Will continue to monitor closely [employer]

## 2015-05-09 NOTE — Progress Notes (Signed)
Physical Therapy Treatment Patient Details Name: Evelyn [name] MRN: [id number] DOB: 06/27/1920 Today's Date: 05/09/2015    History of Present Illness Pt is a 81 y.o. female who was admitted to the hospital after syncopal episode while walking in public. At the hospital she was found to have a UTI.     PT Comments    Upon entering room, patient demonstrated some resistance to PT but participated after encouragement from family members. Patient demonstrated decreased gait distance in comparison to previous sessions, secondary to patient's request and fatigue. Patient agreeable to practicing sit to stands, performing 5 in 35 seconds, indicating increased risk for falls. Patient will continue to benefit from progressive PT to increase muscle strength/endurance, allowing for her to return to PLOF.  Follow Up Recommendations  SNF     Equipment Recommendations  None recommended by PT    Recommendations for Other Services       Precautions / Restrictions Precautions Precautions: Fall Restrictions Weight Bearing Restrictions: No    Mobility  Bed Mobility Overal bed mobility: Needs Assistance Bed Mobility: Supine to Sit;Sit to Supine     Supine to sit: Min assist Sit to supine: Min assist   General bed mobility comments: Patient requires verbal cues for sequencing to get to EOB. Minimal assistance for trunk control in both supine to sit and sit to supine. Demonstrates good sitting balance at EOB.  Transfers Overall transfer level: Needs assistance Equipment used: Rolling walker (2 wheeled) Transfers: Sit to/from Stand Sit to Stand: Min assist         General transfer comment: Patient demonstrates proper sequencing after performing 5x sit to stand.  5x sit to stand in 35 seconds, indicative of increased risk for falls.  Ambulation/Gait Ambulation/Gait assistance: Min assist Ambulation Distance (Feet): 30 Feet Assistive device: Rolling walker (2 wheeled) Gait  Pattern/deviations: Step-through pattern     General Gait Details: Patient ambulates at decreased cadence, complaining of fatigue.   Stairs            Wheelchair Mobility    Modified Rankin (Stroke Patients Only)       Balance Overall balance assessment: Needs assistance Sitting-balance support: Feet supported Sitting balance-Leahy Scale: Good     Standing balance support: Bilateral upper extremity supported Standing balance-Leahy Scale: Good                      Cognition Arousal/Alertness: Awake/alert Behavior During Therapy: WFL for tasks assessed/performed Overall Cognitive Status: No family/caregiver present to determine baseline cognitive functioning Area of Impairment: Safety/judgement       Following Commands: Follows one step commands consistently Safety/Judgement: Decreased awareness of safety          Exercises General Exercises - Lower Extremity Ankle Circles/Pumps: AROM;20 reps Quad Sets: AROM;20 reps Gluteal Sets: AROM;20 reps Long Arc Quad: AROM;10 reps Heel Slides: AROM;10 reps Hip ABduction/ADduction: AROM;15 reps    General Comments        Pertinent Vitals/Pain Pain Assessment: No/denies pain    Home Living                      Prior Function            PT Goals (current goals can now be found in the care plan section) Acute Rehab PT Goals Patient Stated Goal: "To get stronger." PT Goal Formulation: With patient Time For Goal Achievement: 05/20/15 Potential to Achieve Goals: Good Progress towards PT goals: Progressing toward goals  Frequency  Min 2X/week    PT Plan Current plan remains appropriate    Co-evaluation             End of Session Equipment Utilized During Treatment: Gait belt Activity Tolerance: Patient tolerated treatment well;Patient limited by fatigue Patient left: in bed;with call bell/phone within reach;with bed alarm set     Time: 1410-1435 PT Time Calculation (min)  (ACUTE ONLY): 25 min  Charges:  $Therapeutic Exercise: 8-22 mins $Therapeutic Activity: 8-22 mins                    G Codes:  Functional Assessment Tool Used: 5x sit to stand   Dorice Lamas, PT, DPT 05/09/2015, 2:45 PM

## 2015-05-09 NOTE — Progress Notes (Signed)
Spoke with dr. Bridgett Larsson to make aware urine culture results per MD will place orders as needed [facility]

## 2015-05-09 NOTE — Progress Notes (Signed)
Patient ID: Evelyn [name], female   DOB: 01/12/1920, 81 y.o.   MRN: [id number] [facility] PROGRESS NOTE  PCP: Crecencio Mc, MD  HPI/Subjective: Generalized weakness.  Objective: Filed Vitals:   05/09/15 1124  BP: 133/60  Pulse: 64  Temp: 98.3 F (36.8 C)  Resp: 18    Filed Weights   05/05/15 1637  Weight: 64.411 kg (142 lb)    ROS: Review of Systems  Constitutional: Positive for malaise/fatigue. Negative for fever and chills.  Eyes: Negative for blurred vision.  Respiratory: Negative for cough and shortness of breath.   Cardiovascular: Negative for chest pain.  Gastrointestinal: Negative for nausea, vomiting, abdominal pain, diarrhea and constipation.  Genitourinary: Negative for dysuria and frequency.  Musculoskeletal: Negative for joint pain.  Neurological: Negative for dizziness, weakness and headaches.   Exam: Physical Exam  HENT:  Nose: No mucosal edema.  Mouth/Throat: No oropharyngeal exudate or posterior oropharyngeal edema.  Eyes: Conjunctivae, EOM and lids are normal. Pupils are equal, round, and reactive to light.  Neck: No JVD present. Carotid bruit is not present. No edema present. No thyroid mass and no thyromegaly present.  Cardiovascular: S1 normal and S2 normal.  Exam reveals no gallop.   Murmur heard.  Systolic murmur is present with a grade of 2/6  Pulses:      Dorsalis pedis pulses are 2+ on the right side, and 2+ on the left side.  Respiratory: No respiratory distress. She has no wheezes. She has no rhonchi. She has no rales.  GI: Soft. Bowel sounds are normal. There is no tenderness.  Musculoskeletal:       Right ankle: She exhibits no swelling.       Left ankle: She exhibits no swelling.  Lymphadenopathy:    She has no cervical adenopathy.  Neurological: She is alert. No cranial nerve deficit.  Skin: Skin is warm. No rash noted. Nails show no clubbing.  Psychiatric: She has a normal mood and affect.    Data  Reviewed: Basic Metabolic Panel:  Recent Labs Lab 05/05/15 1459 05/06/15 0436 05/08/15 0542  NA 142 143  --   K 3.5 3.6  --   CL 108 110  --   CO2 23 26  --   GLUCOSE 236* 104*  --   BUN 17 16  --   CREATININE 1.33* 1.05* 1.13*  CALCIUM 8.4* 8.4*  --    CBC:  Recent Labs Lab 05/05/15 1459 05/06/15 0436 05/08/15 0542  WBC 13.9* 14.1* 9.3  NEUTROABS 7.8*  --  5.7  HGB 12.1 10.2* 10.0*  HCT 37.1 31.3* 31.2*  MCV 84.5 82.6 84.0  PLT 337 292 268    Studies: No results found.  Scheduled Meds: . apixaban  2.5 mg Oral BID  . levothyroxine  100 mcg Oral QAC breakfast  . meropenem (MERREM) IV  1 g Intravenous Q12H  . multivitamin with minerals  1 tablet Oral Daily  . pantoprazole  40 mg Oral Daily  . propranolol  10 mg Oral BID  . sodium chloride  3 mL Intravenous Q12H  . URELLE  81 mg Oral TID  . vitamin B-12  1,000 mcg Oral Daily  . Vitamin D (Ergocalciferol)  50,000 Units Oral Q7 days   Assessment/Plan:  1. Acute encephalopathy. Syncope and collapse. Improved. She was seen in consultation by neurology who believed that this was not a stroke and canceled the MRI of the brain. 2. Acute cystitis without hematuria. Continue IV meropenem, ID consult due  to urine cultures (ESBL). Leukocytosis improved. 3. Hypothyroidism unspecified, continue levothyroxine 4. History of DVT- on Eliquis 5. Essential hypertension on propranolol 6. Gastroesophageal reflux disease on Protonix. 7. Weakness- PT recommended rehabilitation.  Code Status:     Code Status Orders        Start     Ordered   05/05/15 2048  Full code   Continuous     05/05/15 2048     Family Communication: I discussed with patient's sons and daughter-in-law. Disposition Plan: Possible discharge to skilled nursing facility in 1-2 days.  Consultants:  PT  Neurology  Time spent: 36 minutes.  Demetrios Loll  [facility]

## 2015-05-09 NOTE — Progress Notes (Signed)
Report to called to Rn on 1c patient to be transport to room 103. Made rn aware of patients status and contact precautions. Patient to be transported on ra. No distress noted. No c/o pain [employer]

## 2015-05-10 ENCOUNTER — Telehealth: Payer: Self-pay

## 2015-05-10 MED ORDER — FOSFOMYCIN TROMETHAMINE 3 G PO PACK: 3.0000 g | PACK | ORAL | Status: DC

## 2015-05-10 NOTE — Plan of Care (Signed)
Problem: Discharge Progression Outcomes Goal: Activity appropriate for discharge plan Outcome: Progressing Plan of care progress to goal Hemo- vss, abx continued, afebrile Activity- pt needs moderate assist up to bathroom, incontinent of bladder at times Plan for possible discharge later today.

## 2015-05-10 NOTE — Progress Notes (Signed)
Spoke chart and labs. S/p 3 days of meropenem.  Suggest fosfomycin 3 gm po qod for 3 doses Needs fu with Urology for recurrent UTIS

## 2015-05-10 NOTE — Discharge Instructions (Signed)
Low sodium diet ° °Activity as tolerated °

## 2015-05-10 NOTE — Discharge Summary (Signed)
[facility] [place] NAME: Evelyn [name]    MR#:  [id number]  DATE OF BIRTH:  August 01, 1919  DATE OF ADMISSION:  05/05/2015 ADMITTING PHYSICIAN: Henreitta Leber, MD  DATE OF DISCHARGE: 05/10/2015 PRIMARY CARE PHYSICIAN: Crecencio Mc, MD    ADMISSION DIAGNOSIS:  Syncope and collapse [R55] Altered mental status [R41.82] Altered mental status, unspecified altered mental status type [R41.82] Non-intractable vomiting with nausea, vomiting of unspecified type [R11.2]   DISCHARGE DIAGNOSIS:  ESBL UTI Acute encephalopathy  SECONDARY DIAGNOSIS:   Past Medical History  Diagnosis Date  . Hyperlipidemia   . Thyroid disease     had thyroid removed  . Other chronic cystitis     Radiation , botox injections  . History of lymphoma   . Self-catheterizes urinary bladder     twice daily  . Coronary artery disease   . Coarse tremors     head shakes if does not take propranolol  . Insomnia     takes remeron nightly  . Cancer ([place])     lymphomia  . Anemia     hx of blood transfusion    HOSPITAL COURSE:   1. Acute encephalopathy due to UTI. Syncope and collapse. Improved. She was seen in consultation by neurology who believed that this was not a stroke and canceled the MRI of the brain. 2. Acute cystitis without hematuria. She has been treated with IV meropenem, urine cultures (ESBL). Leukocytosis improved. Dr. Ola Spurr suggested fosfomycin 3 g po qod for 3 doses. 3. Hypothyroidism unspecified, continue levothyroxine 4. History of DVT- on Eliquis 5. Essential hypertension on propranolol 6. Gastroesophageal reflux disease on Protonix. 7. Weakness- PT recommended rehabilitation.  DISCHARGE CONDITIONS:   Stable, discharge to SNF today.  CONSULTS OBTAINED:  Treatment Team:  Demetrios Loll, MD Adrian Prows, MD  DRUG ALLERGIES:   Allergies  Allergen Reactions  . Oxycodone Nausea Only  . Penicillins Swelling, Rash and Other  (See Comments)    Pt is unable to answer additional questions about this medication.    . Prednisone Rash  . Sulfa Antibiotics Swelling and Rash    DISCHARGE MEDICATIONS:   Current Discharge Medication List    CONTINUE these medications which have CHANGED   Details  fosfomycin (MONUROL) 3 G PACK Take 3 g by mouth every other day. Qty: 9 g, Refills: 0      CONTINUE these medications which have NOT CHANGED   Details  apixaban (ELIQUIS) 2.5 MG TABS tablet Take 2.5 mg by mouth 2 (two) times daily.    FeFum-FePo-FA-B Cmp-C-Zn-Mn-Cu (SE-TAN PLUS) 162-115.2-1 MG CAPS Take 1 tablet by mouth daily.    levothyroxine (SYNTHROID, LEVOTHROID) 100 MCG tablet Take 100 mcg by mouth daily.    Methen-Hyosc-Meth Blue-Na Phos (UROGESIC-BLUE) 81.6 MG TABS Take 1 tablet by mouth 3 (three) times daily.    omeprazole (PRILOSEC) 20 MG capsule Take 20 mg by mouth daily.    propranolol (INDERAL) 10 MG tablet Take 10 mg by mouth 2 (two) times daily.    temazepam (RESTORIL) 15 MG capsule Take 15 mg by mouth at bedtime as needed for sleep.    vitamin B-12 (CYANOCOBALAMIN) 1000 MCG tablet Take 1,000 mcg by mouth daily.    Vitamin D, Ergocalciferol, (DRISDOL) 50000 UNITS CAPS capsule Take 50,000 Units by mouth every 7 (seven) days.    zolpidem (AMBIEN) 10 MG tablet Take 10 mg by mouth at bedtime as needed for sleep.  DISCHARGE INSTRUCTIONS:    If you experience worsening of your admission symptoms, develop shortness of breath, life threatening emergency, suicidal or homicidal thoughts you must seek medical attention immediately by calling 911 or calling your MD immediately  if symptoms less severe.  You Must read complete instructions/literature along with all the possible adverse reactions/side effects for all the Medicines you take and that have been prescribed to you. Take any new Medicines after you have completely understood and accept all the possible adverse reactions/side effects.    Please note  You were cared for by a hospitalist during your hospital stay. If you have any questions about your discharge medications or the care you received while you were in the hospital after you are discharged, you can call the unit and asked to speak with the hospitalist on call if the hospitalist that took care of you is not available. Once you are discharged, your primary care physician will handle any further medical issues. Please note that NO REFILLS for any discharge medications will be authorized once you are discharged, as it is imperative that you return to your primary care physician (or establish a relationship with a primary care physician if you do not have one) for your aftercare needs so that they can reassess your need for medications and monitor your lab values.    Today   SUBJECTIVE   Congestion.   VITAL SIGNS:  Blood pressure 150/63, pulse 72, temperature 98 F (36.7 C), temperature source Oral, resp. rate 18, height 5\' 6"  (1.676 m), weight 58.832 kg (129 lb 11.2 oz), SpO2 97 %.  I/O:   Intake/Output Summary (Last 24 hours) at 05/10/15 1114 Last data filed at 05/09/15 1601  Gross per 24 hour  Intake    300 ml  Output    400 ml  Net   -100 ml    PHYSICAL EXAMINATION:  GENERAL:  81 y.o.-year-old patient lying in the bed with no acute distress.  EYES: Pupils equal, round, reactive to light and accommodation. No scleral icterus. Extraocular muscles intact.  HEENT: Head atraumatic, normocephalic. Oropharynx and nasopharynx clear. Moist oral mucosa. NECK:  Supple, no jugular venous distention. No thyroid enlargement, no tenderness.  LUNGS: Normal breath sounds bilaterally, no wheezing, rales,rhonchi or crepitation. No use of accessory muscles of respiration.  CARDIOVASCULAR: S1, S2 normal. No murmurs, rubs, or gallops.  ABDOMEN: Soft, non-tender, non-distended. Bowel sounds present. No organomegaly or mass.  EXTREMITIES: No pedal edema, cyanosis, or  clubbing.  NEUROLOGIC: Cranial nerves II through XII are intact. Muscle strength 4/5 in all extremities. Sensation intact. Gait not checked.  PSYCHIATRIC: The patient is alert and oriented x 3.  SKIN: No obvious rash, lesion, or ulcer.   DATA REVIEW:   CBC  Recent Labs Lab 05/08/15 0542  WBC 9.3  HGB 10.0*  HCT 31.2*  PLT 268    Chemistries   Recent Labs Lab 05/06/15 0436 05/08/15 0542  NA 143  --   K 3.6  --   CL 110  --   CO2 26  --   GLUCOSE 104*  --   BUN 16  --   CREATININE 1.05* 1.13*  CALCIUM 8.4*  --     Cardiac Enzymes  Recent Labs Lab 05/05/15 1459  TROPONINI <0.03    Microbiology Results  Results for orders placed or performed during the hospital encounter of 05/05/15  Urine culture     Status: None   Collection Time: 05/05/15  3:55 PM  Result Value Ref  Range Status   Specimen Description URINE, CLEAN CATCH  Final   Special Requests Normal  Final   Culture   Final    >=100,000 COLONIES/mL ESCHERICHIA COLI ESBL-EXTENDED SPECTRUM BETA LACTAMASE-THE ORGANISM IS RESISTANT TO PENICILLINS, CEPHALOSPORINS AND AZTREONAM ACCORDING TO CLSI M100-S15 VOL.[place]. CRITICAL RESULT CALLED TO, READ BACK BY AND VERIFIED WITH: CRYSTAL MONTELONGO,RN 05/09/2015 1008 JRS.    Report Status 05/09/2015 FINAL  Final   Organism ID, Bacteria ESCHERICHIA COLI  Final      Susceptibility   Escherichia coli - MIC*    AMPICILLIN >=32 RESISTANT Resistant     CEFAZOLIN >=64 RESISTANT Resistant     CEFTRIAXONE >=64 RESISTANT Resistant     CIPROFLOXACIN >=4 RESISTANT Resistant     GENTAMICIN <=1 SENSITIVE Sensitive     IMIPENEM 0.5 SENSITIVE Sensitive     NITROFURANTOIN 256 RESISTANT Resistant     TRIMETH/SULFA >=320 RESISTANT Resistant     Extended ESBL POSITIVE Resistant     PIP/TAZO Value in next row Sensitive      SENSITIVE<=4    LEVOFLOXACIN Value in next row Resistant      RESISTANT>=8    * >=100,000 COLONIES/mL ESCHERICHIA COLI    RADIOLOGY:  No  results found.      Management plans discussed with the patient, family and they are in agreement.  CODE STATUS:     Code Status Orders        Start     Ordered   05/05/15 2048  Full code   Continuous     05/05/15 2048      TOTAL TIME TAKING CARE OF THIS PATIENT: 36 minutes.    Demetrios Loll M.D on 05/10/2015 at 11:14 AM  Between 7am to 6pm - Pager - [phone]  After 6pm go to [email or web address] - password [email or web address] [facility]  Office  [phone]  CC: Primary care physician; Crecencio Mc, MD

## 2015-05-10 NOTE — Progress Notes (Signed)
Pt to be discharged to [facility] place.  Son will transport to facility.  Report called to [place] at [place].

## 2015-05-10 NOTE — Progress Notes (Signed)
CSW was notified by MD that Pt has been medically cleared for dc to SNF [facility].  Pt is familiar with SNF, as she has been before. Pt would prefer for to go to SNF with her son in the car.  CSW prepared dc packet and sent dc summary to SNF.  CSW contacted son and he will pick up Pt after he gets off of work.  CSW spoke to Amy at [facility] and auth received for snf at dc. Auth [id number]. RN to call report.   No further CSW needs at this time.   Toma Copier, [place]

## 2015-05-10 NOTE — Progress Notes (Signed)
Pt discharged

## 2015-05-10 NOTE — Clinical Social Work Placement (Signed)
   CLINICAL SOCIAL WORK PLACEMENT  NOTE  Date:  05/10/2015  Patient Details  Name: Evelyn [name] MRN: [id number] Date of Birth: [birth year]  Clinical Social Work is seeking post-discharge placement for this patient at the [place] level of care (*CSW will initial, date and re-position this form in  chart as items are completed):  Yes   Patient/family provided with [place] Work Department's list of facilities offering this level of care within the geographic area requested by the patient (or if unable, by the patient's family).  Yes   Patient/family informed of their freedom to choose among providers that offer the needed level of care, that participate in Medicare, Medicaid or managed care program needed by the patient, have an available bed and are willing to accept the patient.  Yes   Patient/family informed of [facility]'s ownership interest in [facility] and [facility], as well as of the fact that they are under no obligation to receive care at these facilities.  PASRR submitted to EDS on       PASRR number received on       Existing PASRR number confirmed on 05/06/15     FL2 transmitted to all facilities in geographic area requested by pt/family on 05/06/15     FL2 transmitted to all facilities within larger geographic area on       Patient informed that his/her managed care company has contracts with or will negotiate with certain facilities, including the following:        Yes   Patient/family informed of bed offers received.  Patient chooses bed at  [facility])     Physician recommends and patient chooses bed at      Patient to be transferred to [facility] on 05/10/15.  Patient to be transferred to facility by [facility]     Patient family notified on 05/10/15 of transfer.  Name of family member notified:  Son [place]     PHYSICIAN       Additional Comment:     _______________________________________________ Alonna Buckler, LCSW 05/10/2015, 1:46 PM

## 2015-05-10 NOTE — Telephone Encounter (Signed)
Unable to reach.  TCM call attempt x1 made.   Will continue to follow

## 2015-05-11 ENCOUNTER — Inpatient Hospital Stay: Admit: 2015-05-11 | Payer: Self-pay

## 2015-05-11 ENCOUNTER — Telehealth: Payer: Self-pay

## 2015-05-11 DIAGNOSIS — G9340 Encephalopathy, unspecified: Secondary | ICD-10-CM | POA: Diagnosis present

## 2015-05-11 LAB — C DIFFICILE QUICK SCREEN W PCR REFLEX
C Diff antigen: NEGATIVE
C Diff interpretation: NEGATIVE
C Diff toxin: NEGATIVE

## 2015-05-11 NOTE — Telephone Encounter (Signed)
Unable to reach.  TCM call x2.  Will continue to follow and schedule appointment.

## 2015-05-12 ENCOUNTER — Telehealth: Payer: Self-pay

## 2015-05-12 NOTE — Telephone Encounter (Signed)
i believe she may have gone to skilled nursing

## 2015-05-12 NOTE — Telephone Encounter (Signed)
Unable to reach patient to see how the transition from hospital to home is going.  Will continue to follow and schedule an appointment as soon as possible.

## 2015-05-19 DIAGNOSIS — G9340 Encephalopathy, unspecified: Secondary | ICD-10-CM | POA: Diagnosis not present

## 2015-05-20 DIAGNOSIS — G9340 Encephalopathy, unspecified: Secondary | ICD-10-CM | POA: Diagnosis not present

## 2015-05-20 LAB — CBC WITH DIFFERENTIAL/PLATELET
BASOS PCT: 1 %
Basophils Absolute: 0.1 10*3/uL (ref 0–0.1)
EOS ABS: 0.6 10*3/uL (ref 0–0.7)
EOS PCT: 6 %
HCT: 33.3 % — ABNORMAL LOW (ref 35.0–47.0)
HEMOGLOBIN: 11.1 g/dL — AB (ref 12.0–16.0)
LYMPHS ABS: 2.7 10*3/uL (ref 1.0–3.6)
Lymphocytes Relative: 28 %
MCH: 28.2 pg (ref 26.0–34.0)
MCHC: 33.5 g/dL (ref 32.0–36.0)
MCV: 84.4 fL (ref 80.0–100.0)
MONOS PCT: 10 %
Monocytes Absolute: 1 10*3/uL — ABNORMAL HIGH (ref 0.2–0.9)
NEUTROS PCT: 55 %
Neutro Abs: 5.2 10*3/uL (ref 1.4–6.5)
PLATELETS: 356 10*3/uL (ref 150–440)
RBC: 3.95 MIL/uL (ref 3.80–5.20)
RDW: 16.5 % — ABNORMAL HIGH (ref 11.5–14.5)
WBC: 9.6 10*3/uL (ref 3.6–11.0)

## 2015-05-20 LAB — COMPREHENSIVE METABOLIC PANEL
ALBUMIN: 3.2 g/dL — AB (ref 3.5–5.0)
ALK PHOS: 64 U/L (ref 38–126)
ALT: 16 U/L (ref 14–54)
ANION GAP: 7 (ref 5–15)
AST: 20 U/L (ref 15–41)
BUN: 16 mg/dL (ref 6–20)
CALCIUM: 8.6 mg/dL — AB (ref 8.9–10.3)
CHLORIDE: 101 mmol/L (ref 101–111)
CO2: 30 mmol/L (ref 22–32)
CREATININE: 0.87 mg/dL (ref 0.44–1.00)
GFR calc non Af Amer: 56 mL/min — ABNORMAL LOW (ref >60)
Glucose, Bld: 89 mg/dL (ref 65–99)
Potassium: 4.6 mmol/L (ref 3.5–5.1)
SODIUM: 138 mmol/L (ref 135–145)
Total Bilirubin: 0.3 mg/dL (ref 0.3–1.2)
Total Protein: 6.1 g/dL — ABNORMAL LOW (ref 6.5–8.1)

## 2015-05-20 LAB — URINALYSIS COMPLETE WITH MICROSCOPIC (ARMC ONLY): Specific Gravity, Urine: 1.008 (ref 1.005–1.030)

## 2015-05-22 LAB — URINE CULTURE

## 2015-05-24 LAB — VITAMIN D 1,25 DIHYDROXY
Vitamin D 1, 25 (OH)2 Total: 32 pg/mL
Vitamin D2 1, 25 (OH)2: 12 pg/mL
Vitamin D3 1, 25 (OH)2: 20 pg/mL

## 2015-05-25 DIAGNOSIS — G9340 Encephalopathy, unspecified: Secondary | ICD-10-CM | POA: Diagnosis not present

## 2015-05-25 LAB — URINALYSIS COMPLETE WITH MICROSCOPIC (ARMC ONLY): Specific Gravity, Urine: 1.01 (ref 1.005–1.030)

## 2015-05-28 LAB — URINE CULTURE

## 2015-06-01 ENCOUNTER — Inpatient Hospital Stay: Payer: PPO | Admitting: Oncology

## 2015-06-01 ENCOUNTER — Inpatient Hospital Stay: Payer: PPO

## 2015-06-03 ENCOUNTER — Telehealth: Payer: Self-pay | Admitting: *Deleted

## 2015-06-03 NOTE — Telephone Encounter (Signed)
Please advise 

## 2015-06-03 NOTE — Telephone Encounter (Signed)
I cannot, since I have not seen her since October 2015.  She did not keep her hospital follow up so they will have to ask Dr Ouida Sills for the order.

## 2015-06-03 NOTE — Telephone Encounter (Signed)
The Physical Therapist of [place] has requested an updated order for her physical and occupational therapy.

## 2015-06-04 ENCOUNTER — Telehealth: Payer: Self-pay | Admitting: *Deleted

## 2015-06-04 NOTE — Telephone Encounter (Signed)
Spoke with Caryl Pina at [facility] and she stated that Evelyn [name] did not need the physical therapy form. The patient needed a refill of the Urelle. I advised her that the patient has not been seen here since 04/2014 and she would need to contact Dr. Ouida Sills who prescribed the medication.

## 2015-06-04 NOTE — Telephone Encounter (Signed)
Received a vmail from [place] at [place] requesting a callback concerning patient, Evelyn [name] dob 1919-11-23.  I returned call and LMTCB.

## 2015-06-09 ENCOUNTER — Telehealth: Payer: Self-pay | Admitting: Internal Medicine

## 2015-06-09 ENCOUNTER — Ambulatory Visit: Payer: PPO | Admitting: Internal Medicine

## 2015-06-09 DIAGNOSIS — Z0289 Encounter for other administrative examinations: Secondary | ICD-10-CM

## 2015-06-09 NOTE — Telephone Encounter (Signed)
Amy [phone] called from [facility] to follow up on verbal order to get PT and OT. She called before at the end of last week. Thank You!

## 2015-06-10 ENCOUNTER — Other Ambulatory Visit: Payer: Self-pay

## 2015-06-10 NOTE — Telephone Encounter (Signed)
Left message to call back  

## 2015-06-10 NOTE — Telephone Encounter (Signed)
And as I said last time,  I have not seen patient in nearly a year, so I will not order PT OT on a patient i have not seen.  She DNKA'd her appointment yesterday as well.

## 2015-06-16 ENCOUNTER — Inpatient Hospital Stay: Payer: PPO | Admitting: Oncology

## 2015-06-16 ENCOUNTER — Inpatient Hospital Stay: Payer: PPO | Attending: Oncology

## 2015-06-22 ENCOUNTER — Encounter: Payer: Self-pay | Admitting: Internal Medicine

## 2015-06-22 ENCOUNTER — Ambulatory Visit (INDEPENDENT_AMBULATORY_CARE_PROVIDER_SITE_OTHER): Payer: PPO | Admitting: Internal Medicine

## 2015-06-22 VITALS — BP 118/72 | HR 69 | Temp 97.7°F | Resp 12 | Ht 62.0 in | Wt 127.0 lb

## 2015-06-22 DIAGNOSIS — R630 Anorexia: Secondary | ICD-10-CM | POA: Diagnosis not present

## 2015-06-22 DIAGNOSIS — I10 Essential (primary) hypertension: Secondary | ICD-10-CM

## 2015-06-22 DIAGNOSIS — A09 Infectious gastroenteritis and colitis, unspecified: Secondary | ICD-10-CM | POA: Diagnosis not present

## 2015-06-22 DIAGNOSIS — R197 Diarrhea, unspecified: Secondary | ICD-10-CM

## 2015-06-22 DIAGNOSIS — N3000 Acute cystitis without hematuria: Secondary | ICD-10-CM

## 2015-06-22 DIAGNOSIS — R634 Abnormal weight loss: Secondary | ICD-10-CM

## 2015-06-22 MED ORDER — MIRTAZAPINE 7.5 MG PO TABS: 7.5000 mg | ORAL_TABLET | Freq: Every day | ORAL | Status: DC

## 2015-06-22 NOTE — Progress Notes (Signed)
Subjective:  Patient ID: Evelyn [name], female    DOB: 05/18/1920  Age: 81 y.o. MRN: [id number] was negative for outflow tract  W CC: The primary encounter diagnosis was Diarrhea of presumed infectious origin. Diagnoses of Anorexia, Loss of weight, Acute cystitis without hematuria, and Essential hypertension were also pertinent to this visit. [place]  HPI Evelyn [name] presents for hospital  follow up.  She was admitted to [facility] on Oct 5th with a history of syncope with fecal incontinence that occurred while in [place]. She was diagnosed with delirium secondary to UTI .  ECHO was done and EF was normal. .  No outflow tract issues. Carotid dopplers showed bilateral heterogenous placque  She was evaluated by neurology and after treatment for UTI she was was discharged to [facility]  On October 10th.  She was transferred from to [facility] on Oct 28th   Hospitalization records reviewed. She was treated for Klebsiella and Enterococcus UTI .    She Has lost weight .  Appetite poor.  "I have diarrhea all the time"  None reported by staff. Had a fecal incontinence episode  Recently.    Outpatient Prescriptions Prior to Visit  Medication Sig Dispense Refill  . apixaban (ELIQUIS) 2.5 MG TABS tablet Take 2.5 mg by mouth 2 (two) times daily.    Marland Kitchen FeFum-FePo-FA-B Cmp-C-Zn-Mn-Cu (SE-TAN PLUS) 162-115.2-1 MG CAPS Take 1 tablet by mouth daily.    Marland Kitchen levothyroxine (SYNTHROID, LEVOTHROID) 100 MCG tablet Take 100 mcg by mouth daily.    . Methen-Hyosc-Meth Blue-Na Phos (UROGESIC-BLUE) 81.6 MG TABS Take 1 tablet by mouth 3 (three) times daily.    Marland Kitchen omeprazole (PRILOSEC) 20 MG capsule Take 20 mg by mouth daily.    . propranolol (INDERAL) 10 MG tablet Take 10 mg by mouth 2 (two) times daily.    . temazepam (RESTORIL) 15 MG capsule Take 15 mg by mouth at bedtime as needed for sleep.    . vitamin B-12 (CYANOCOBALAMIN) 1000 MCG tablet Take 1,000 mcg by mouth daily.    . Vitamin D, Ergocalciferol,  (DRISDOL) 50000 UNITS CAPS capsule Take 50,000 Units by mouth every 7 (seven) days.    Marland Kitchen zolpidem (AMBIEN) 10 MG tablet Take 10 mg by mouth at bedtime as needed for sleep.    . fosfomycin (MONUROL) 3 G PACK Take 3 g by mouth every other day. (Patient not taking: Reported on 06/22/2015) 9 g 0   No facility-administered medications prior to visit.    Review of Systems;  Patient denies headache, fevers, malaise, unintentional weight loss, skin rash, eye pain, sinus congestion and sinus pain, sore throat, dysphagia,  hemoptysis , cough, dyspnea, wheezing, chest pain, palpitations, orthopnea, edema, abdominal pain, nausea, melena, diarrhea, constipation, flank pain, dysuria, hematuria, urinary  Frequency, nocturia, numbness, tingling, seizures,  Focal weakness, Loss of consciousness,  Tremor, insomnia, depression, anxiety, and suicidal ideation.      Objective:  BP 118/72 mmHg  Pulse 69  Temp(Src) 97.7 F (36.5 C) (Oral)  Resp 12  Ht 5\' 2"  (1.575 m)  Wt 127 lb (57.607 kg)  BMI 23.22 kg/m2  SpO2 98%  BP Readings from Last 3 Encounters:  06/22/15 118/72  05/10/15 150/63  03/09/15 122/68    Wt Readings from Last 3 Encounters:  06/22/15 127 lb (57.607 kg)  05/09/15 129 lb 11.2 oz (58.832 kg)  03/09/15 134 lb 3.2 oz (60.873 kg)    General appearance: alert, cooperative and appears stated age Ears: normal TM's and external ear  canals both ears Throat: lips, mucosa, and tongue normal; teeth and gums normal Neck: no adenopathy, no carotid bruit, supple, symmetrical, trachea midline and thyroid not enlarged, symmetric, no tenderness/mass/nodules Back: symmetric, no curvature. ROM normal. No CVA tenderness. Lungs: clear to auscultation bilaterally Heart: regular rate and rhythm, S1, S2 normal, no murmur, click, rub or gallop Abdomen: soft, non-tender; bowel sounds normal; no masses,  no organomegaly Pulses: 2+ and symmetric Skin: Skin color, texture, turgor normal. No rashes or  lesions Lymph nodes: Cervical, supraclavicular, and axillary nodes normal.  Lab Results  Component Value Date   HGBA1C 6.1 03/23/2014    Lab Results  Component Value Date   CREATININE 0.87 05/20/2015   CREATININE 1.13* 05/08/2015   CREATININE 1.05* 05/06/2015    Lab Results  Component Value Date   WBC 9.6 05/20/2015   HGB 11.1* 05/20/2015   HCT 33.3* 05/20/2015   PLT 356 05/20/2015   GLUCOSE 89 05/20/2015   CHOL 260* 03/23/2014   TRIG 268.0* 03/23/2014   HDL 43.60 03/23/2014   LDLDIRECT 210.1 03/23/2014   LDLCALC 191* 12/26/2013   ALT 16 05/20/2015   AST 20 05/20/2015   NA 138 05/20/2015   K 4.6 05/20/2015   CL 101 05/20/2015   CREATININE 0.87 05/20/2015   BUN 16 05/20/2015   CO2 30 05/20/2015   TSH 6.03* 03/09/2015   INR 1.00 05/05/2015   HGBA1C 6.1 03/23/2014    No results found.  Assessment & Plan:   Problem List Items Addressed This Visit    Urinary tract infection    Multiple pathogens,  Klebsiella and Enterococcus, treated with fosfomycin . She has a long history of recurrent UTI secondary to urinary  Incontinence , complicated by drug intolerances.       Hypertension    Well controlled on current regimen. Renal function stable, no changes today.  Lab Results  Component Value Date   CREATININE 0.87 05/20/2015   Lab Results  Component Value Date   NA 138 05/20/2015   K 4.6 05/20/2015   CL 101 05/20/2015   CO2 30 05/20/2015         Diarrhea - Primary    Given her recent use of antibiotics,  Need to rule out c dificile,       Relevant Orders   Clostridium difficile EIA   Stool culture   Anorexia    Trial of remeron, starting with 7.5 mg       Loss of weight      I am having Evelyn [name] start on mirtazapine. I am also having her maintain her temazepam, zolpidem, omeprazole, apixaban, vitamin B-12, Vitamin D (Ergocalciferol), levothyroxine, UROGESIC-BLUE, SE-TAN PLUS, propranolol, and fosfomycin.  Meds ordered this encounter   Medications  . mirtazapine (REMERON) 7.5 MG tablet    Sig: Take 1 tablet (7.5 mg total) by mouth at bedtime.    Dispense:  90 tablet    Refill:  1    There are no discontinued medications.  Follow-up: No Follow-up on file.   Crecencio Mc, MD

## 2015-06-22 NOTE — Assessment & Plan Note (Signed)
Trial of remeron, starting with 7.5 mg

## 2015-06-22 NOTE — Assessment & Plan Note (Signed)
Given her recent use of antibiotics,  Need to rule out c dificile,

## 2015-06-22 NOTE — Assessment & Plan Note (Signed)
Well controlled on current regimen. Renal function stable, no changes today.  Lab Results  Component Value Date   CREATININE 0.87 05/20/2015   Lab Results  Component Value Date   NA 138 05/20/2015   K 4.6 05/20/2015   CL 101 05/20/2015   CO2 30 05/20/2015

## 2015-06-22 NOTE — Assessment & Plan Note (Signed)
Multiple pathogens,  Klebsiella and Enterococcus, treated with fosfomycin . She has a long history of recurrent UTI secondary to urinary  Incontinence , complicated by drug intolerances.

## 2015-07-01 ENCOUNTER — Telehealth: Payer: Self-pay | Admitting: *Deleted

## 2015-07-01 NOTE — Telephone Encounter (Signed)
Evelyn [name] from from [place] has requested verbal orders for Home health Physical therapy. Patient was seen by Dr.Tullo 06/22/15. Please advise

## 2015-07-01 NOTE — Telephone Encounter (Signed)
Please advise 

## 2015-07-02 NOTE — Telephone Encounter (Signed)
Gave verbal orders to [facility] for PT/OT. Juliann Pulse also faxed orders on 06/30/15

## 2015-07-02 NOTE — Telephone Encounter (Signed)
Ok to give Verbal order for [facility] PT .  thanks

## 2015-07-06 ENCOUNTER — Inpatient Hospital Stay (HOSPITAL_BASED_OUTPATIENT_CLINIC_OR_DEPARTMENT_OTHER): Payer: PPO | Admitting: Oncology

## 2015-07-06 ENCOUNTER — Inpatient Hospital Stay: Payer: PPO | Attending: Oncology

## 2015-07-06 VITALS — BP 138/78 | HR 76 | Temp 97.0°F | Resp 18 | Wt 128.0 lb

## 2015-07-06 DIAGNOSIS — Z87891 Personal history of nicotine dependence: Secondary | ICD-10-CM | POA: Diagnosis not present

## 2015-07-06 DIAGNOSIS — Z79899 Other long term (current) drug therapy: Secondary | ICD-10-CM | POA: Insufficient documentation

## 2015-07-06 DIAGNOSIS — Z8572 Personal history of non-Hodgkin lymphomas: Secondary | ICD-10-CM | POA: Diagnosis present

## 2015-07-06 DIAGNOSIS — E785 Hyperlipidemia, unspecified: Secondary | ICD-10-CM | POA: Diagnosis not present

## 2015-07-06 DIAGNOSIS — Z9221 Personal history of antineoplastic chemotherapy: Secondary | ICD-10-CM | POA: Diagnosis not present

## 2015-07-06 DIAGNOSIS — C8590 Non-Hodgkin lymphoma, unspecified, unspecified site: Secondary | ICD-10-CM

## 2015-07-06 DIAGNOSIS — Z8579 Personal history of other malignant neoplasms of lymphoid, hematopoietic and related tissues: Secondary | ICD-10-CM

## 2015-07-06 DIAGNOSIS — I2510 Atherosclerotic heart disease of native coronary artery without angina pectoris: Secondary | ICD-10-CM | POA: Insufficient documentation

## 2015-07-06 LAB — COMPREHENSIVE METABOLIC PANEL
ALK PHOS: 62 U/L (ref 38–126)
ALT: 13 U/L — AB (ref 14–54)
AST: 17 U/L (ref 15–41)
Albumin: 3.9 g/dL (ref 3.5–5.0)
Anion gap: 7 (ref 5–15)
BILIRUBIN TOTAL: 0.4 mg/dL (ref 0.3–1.2)
BUN: 16 mg/dL (ref 6–20)
CALCIUM: 8.4 mg/dL — AB (ref 8.9–10.3)
CO2: 26 mmol/L (ref 22–32)
CREATININE: 0.99 mg/dL (ref 0.44–1.00)
Chloride: 100 mmol/L — ABNORMAL LOW (ref 101–111)
GFR, EST AFRICAN AMERICAN: 55 mL/min — AB (ref >60)
GFR, EST NON AFRICAN AMERICAN: 48 mL/min — AB (ref >60)
Glucose, Bld: 114 mg/dL — ABNORMAL HIGH (ref 65–99)
Potassium: 3.8 mmol/L (ref 3.5–5.1)
Sodium: 133 mmol/L — ABNORMAL LOW (ref 135–145)
TOTAL PROTEIN: 6.8 g/dL (ref 6.5–8.1)

## 2015-07-06 LAB — CBC WITH DIFFERENTIAL/PLATELET
BASOS PCT: 1 %
Basophils Absolute: 0.1 10*3/uL (ref 0–0.1)
EOS ABS: 0.5 10*3/uL (ref 0–0.7)
EOS PCT: 5 %
HCT: 36.4 % (ref 35.0–47.0)
HEMOGLOBIN: 12 g/dL (ref 12.0–16.0)
LYMPHS ABS: 2.7 10*3/uL (ref 1.0–3.6)
Lymphocytes Relative: 29 %
MCH: 28 pg (ref 26.0–34.0)
MCHC: 32.9 g/dL (ref 32.0–36.0)
MCV: 85.2 fL (ref 80.0–100.0)
MONOS PCT: 10 %
Monocytes Absolute: 0.9 10*3/uL (ref 0.2–0.9)
NEUTROS PCT: 55 %
Neutro Abs: 5.2 10*3/uL (ref 1.4–6.5)
PLATELETS: 327 10*3/uL (ref 150–440)
RBC: 4.28 MIL/uL (ref 3.80–5.20)
RDW: 16.9 % — ABNORMAL HIGH (ref 11.5–14.5)
WBC: 9.4 10*3/uL (ref 3.6–11.0)

## 2015-07-06 NOTE — Progress Notes (Signed)
Patient recently moved into [facility].

## 2015-07-07 ENCOUNTER — Encounter: Payer: Self-pay | Admitting: Oncology

## 2015-07-07 NOTE — Progress Notes (Signed)
[place] @ [facility] [phone]  Fax:([phone]) [place]: 1919-12-02  MR#: [id number]  LPF#:[id number]  Patient Care Team: Crecencio Mc, MD as PCP - General (Internal Medicine)  CHIEF COMPLAINT: Oncology history Chief Complaint  Patient presents with  . Lymphoma   Chief Complaint/Diagnosis:   1. Follicular low-grade lymphoma, on, diagnosis.  In 1987 2. Patient had multiple recurrences.  And has received chemotherapy  No recurrence since 2004.  No history exists.    Oncology Flowsheet 01/25/2012 01/26/2012 12/31/2012 01/09/2013 01/14/2013 05/27/2014 05/07/2015  cyanocobalamin ((VITAMIN B-12)) IM - - 1,000 mcg 1,000 mcg 1,000 mcg 1,000 mcg -  diazepam (VALIUM) PO 5 mg 5 mg - - - - -  ondansetron (ZOFRAN) IJ - - - - - - -  ondansetron (ZOFRAN) IV - - - - - - 4 mg    INTERVAL HISTORY: 80 year old lady who lives in assisted living facility.  Had multiple hospital admission with bladder infection later on was transferred to assisted living facility.  Patient's general condition has been declining due to multiple comorbidities condition.  Patient is now being followed here for regarding follicular lymphoma.  No chills no fever.  Continues to her low back pain and recurrent bladder infection. REVIEW OF SYSTEMS:    general status: Patient is feeling weak and tired.  No change in a performance status.  No chills.  No fever. HEENT:  No evidence of stomatitis Lungs: No cough or shortness of breath Cardiac: No chest pain or paroxysmal nocturnal dyspnea GI: No nausea no vomiting no diarrhea no abdominal pain Skin: No rash Lower extremity no swelling Neurological system: No tingling.  No numbness.  No other focal signs Musculoskeletal system no bony pains  As per HPI. Otherwise, a complete review of systems is negatve.  PAST MEDICAL HISTORY: Past Medical History  Diagnosis Date  . Hyperlipidemia   . Thyroid disease     had thyroid removed  . Other  chronic cystitis     Radiation , botox injections  . History of lymphoma   . Self-catheterizes urinary bladder     twice daily  . Coronary artery disease   . Coarse tremors     head shakes if does not take propranolol  . Insomnia     takes remeron nightly  . Cancer ([place])     lymphomia  . Anemia     hx of blood transfusion    PAST SURGICAL HISTORY: Past Surgical History  Procedure Laterality Date  . Cardiac catheterization  2005    2 cardiac stents  . Abdominal hysterectomy  1970s  . Eye surgery      bilateral cataracts  . Joint replacement  2012    right hip  . Stomach surgery      removal all but small portion of stomach out due to ulcers  . Appendectomy    . Back surgery      lower back - bulging disk  . Spine surgery  2008    [place]  . Lumbar laminectomy/decompression microdiscectomy  01/24/2012    Procedure: LUMBAR LAMINECTOMY/DECOMPRESSION MICRODISCECTOMY 2 LEVELS;  Surgeon: Floyce Stakes, MD;  Location: [facility];  Service: Neurosurgery;  Laterality: Right;  Right Lumbar four five, lumbar five sacral one foraminotomy   . Cystoscopy  April 2013    FAMILY HISTORY Family History  Problem Relation Age of Onset  . Diabetes Mother   . Heart disease Father     ADVANCED DIRECTIVES:  No flowsheet data found.  HEALTH MAINTENANCE: Social History  Substance Use Topics  . Smoking status: Former Smoker    Quit date: 04/04/1971  . Smokeless tobacco: Never Used  . Alcohol Use: No      Allergies  Allergen Reactions  . Oxycodone Nausea Only  . Penicillins Swelling, Rash and Other (See Comments)    Pt is unable to answer additional questions about this medication.    . Prednisone Rash  . Sulfa Antibiotics Swelling and Rash    Current Outpatient Prescriptions  Medication Sig Dispense Refill  . apixaban (ELIQUIS) 2.5 MG TABS tablet Take 2.5 mg by mouth 2 (two) times daily.    Marland Kitchen levothyroxine (SYNTHROID, LEVOTHROID) 100 MCG tablet Take 100 mcg by mouth  daily.    . Methen-Hyosc-Meth Blue-Na Phos (UROGESIC-BLUE) 81.6 MG TABS Take 1 tablet by mouth 3 (three) times daily.    . mirtazapine (REMERON) 7.5 MG tablet Take 1 tablet (7.5 mg total) by mouth at bedtime. 90 tablet 1  . Multiple Vitamins-Minerals (MULTIVITAMIN WITH MINERALS) tablet Take 1 tablet by mouth daily.    Marland Kitchen omeprazole (PRILOSEC) 20 MG capsule Take 20 mg by mouth daily.    . propranolol (INDERAL) 10 MG tablet Take 10 mg by mouth 2 (two) times daily.    . temazepam (RESTORIL) 15 MG capsule Take 15 mg by mouth at bedtime as needed for sleep.    Marland Kitchen URELLE (URELLE/URISED) 81 MG TABS tablet Take 1 tablet by mouth 3 (three) times daily.    . vitamin B-12 (CYANOCOBALAMIN) 1000 MCG tablet Take 1,000 mcg by mouth daily.    Marland Kitchen zolpidem (AMBIEN) 10 MG tablet Take 10 mg by mouth at bedtime as needed for sleep.    . [DISCONTINUED] esomeprazole (NEXIUM) 40 MG capsule Take 1 capsule (40 mg total) by mouth daily. 30 capsule 6   No current facility-administered medications for this visit.    OBJECTIVE:  Filed Vitals:   07/06/15 1523  BP: 138/78  Pulse: 76  Temp: 97 F (36.1 C)  Resp: 18     Body mass index is 23.41 kg/(m^2).    ECOG FS:2 - Symptomatic, <50% confined to bed  PHYSICAL EXAM: Gen. status: Patient is alert oriented walking with the help of walker. Lymphatic system: Supraclavicular, cervical, axillary, inguinal lymph nodes are not palpable Cardiac: Soft systolic murmur.  Occasionally irregular heart sounds Abdominal exam revealed normal bowel sounds. The abdomen was soft, non-tender, and without masses, organomegaly, or appreciable enlargement of the abdominal aorta. Lower extremity trace edema Examination of the skin revealed no evidence of significant rashes, suspicious appearing nevi or other concerning lesions. Neurologically, the patient was awake, alert, and oriented to person, place and time. There were no obvious focal neurologic abnormalities. All other systems have  been examined   LAB RESULTS:  CBC Latest Ref Rng 07/06/2015 05/20/2015  WBC 3.6 - 11.0 K/uL 9.4 9.6  Hemoglobin 12.0 - 16.0 g/dL 12.0 11.1(L)  Hematocrit 35.0 - 47.0 % 36.4 33.3(L)  Platelets 150 - 440 K/uL 327 356    Appointment on 07/06/2015  Component Date Value Ref Range Status  . WBC 07/06/2015 9.4  3.6 - 11.0 K/uL Final  . RBC 07/06/2015 4.28  3.80 - 5.20 MIL/uL Final  . Hemoglobin 07/06/2015 12.0  12.0 - 16.0 g/dL Final  . HCT 07/06/2015 36.4  35.0 - 47.0 % Final  . MCV 07/06/2015 85.2  80.0 - 100.0 fL Final  . MCH 07/06/2015 28.0  26.0 - 34.0 pg Final  .  MCHC 07/06/2015 32.9  32.0 - 36.0 g/dL Final  . RDW 07/06/2015 16.9* 11.5 - 14.5 % Final  . Platelets 07/06/2015 327  150 - 440 K/uL Final  . Neutrophils Relative % 07/06/2015 55   Final  . Neutro Abs 07/06/2015 5.2  1.4 - 6.5 K/uL Final  . Lymphocytes Relative 07/06/2015 29   Final  . Lymphs Abs 07/06/2015 2.7  1.0 - 3.6 K/uL Final  . Monocytes Relative 07/06/2015 10   Final  . Monocytes Absolute 07/06/2015 0.9  0.2 - 0.9 K/uL Final  . Eosinophils Relative 07/06/2015 5   Final  . Eosinophils Absolute 07/06/2015 0.5  0 - 0.7 K/uL Final  . Basophils Relative 07/06/2015 1   Final  . Basophils Absolute 07/06/2015 0.1  0 - 0.1 K/uL Final  . Sodium 07/06/2015 133* 135 - 145 mmol/L Final  . Potassium 07/06/2015 3.8  3.5 - 5.1 mmol/L Final  . Chloride 07/06/2015 100* 101 - 111 mmol/L Final  . CO2 07/06/2015 26  22 - 32 mmol/L Final  . Glucose, Bld 07/06/2015 114* 65 - 99 mg/dL Final  . BUN 07/06/2015 16  6 - 20 mg/dL Final  . Creatinine, Ser 07/06/2015 0.99  0.44 - 1.00 mg/dL Final  . Calcium 07/06/2015 8.4* 8.9 - 10.3 mg/dL Final  . Total Protein 07/06/2015 6.8  6.5 - 8.1 g/dL Final  . Albumin 07/06/2015 3.9  3.5 - 5.0 g/dL Final  . AST 07/06/2015 17  15 - 41 U/L Final  . ALT 07/06/2015 13* 14 - 54 U/L Final  . Alkaline Phosphatase 07/06/2015 62  38 - 126 U/L Final  . Total Bilirubin 07/06/2015 0.4  0.3 - 1.2 mg/dL  Final  . GFR calc non Af Amer 07/06/2015 48* >60 mL/min Final  . GFR calc Af Amer 07/06/2015 55* >60 mL/min Final   Comment: (NOTE) The eGFR has been calculated using the CKD EPI equation. This calculation has not been validated in all clinical situations. eGFR's persistently <60 mL/min signify possible Chronic Kidney Disease.   . Anion gap 07/06/2015 7  5 - 15 Final        ASSESSMENT: Follicular lymphoma No evidence of recurrent or progressive disease at present time  MEDICAL DECISION MAKING:  All lab data has been reviewed As patient's general condition has been declining due to senility and comorbid condition of osteoarthritis bladder infection patient will be discharged from my care and will be followed by primary care physician I will be happy to see this patient in the event of any abnormal symptoms suggesting recurrent follicular lymphoma  Patient expressed understanding and was in agreement with this plan. She also understands that She can call clinic at any time with any questions, concerns, or complaints.    No matching staging information was found for the patient.  Forest Gleason, MD   07/07/2015 8:07 AM

## 2015-07-14 DIAGNOSIS — R2681 Unsteadiness on feet: Secondary | ICD-10-CM | POA: Insufficient documentation

## 2015-08-04 ENCOUNTER — Telehealth: Payer: Self-pay

## 2015-08-04 ENCOUNTER — Telehealth: Payer: Self-pay | Admitting: Internal Medicine

## 2015-08-04 NOTE — Telephone Encounter (Signed)
Please advise for DC order for medications.  thanks

## 2015-08-04 NOTE — Telephone Encounter (Signed)
Santiago Glad [id number] called from [place] regarding pt family wanting pt to stop taking URELLE (URELLE/URISED) 81 MG TABS tablet due to cost and pt is refusing the procel protein powder karen needs a DC order. Fax number [phone]. Thank You!

## 2015-08-04 NOTE — Telephone Encounter (Signed)
Received fax copy of [facility] note.   Patient was to be taking Eliquis 2.5 twice a day, and Courtney from [place] wanted to report a medication error.  Patient has been receiving medication only once a day for the month of December until it was realized on the 29th of the month.  Currently receiving the correct dose.   Just wanted to leave the message.  i didn't see that it had interfaced in our system.   FYI Thanks

## 2015-08-11 NOTE — Telephone Encounter (Signed)
Please advise if this was faxed to the facility?

## 2015-08-11 NOTE — Telephone Encounter (Signed)
Santiago Glad is calling back to follow up on the request on 08/04/2015. See Note below. Thank You!

## 2015-08-11 NOTE — Telephone Encounter (Signed)
Never received the first time.  Letter for dc both meds  now drafted.

## 2015-08-30 ENCOUNTER — Other Ambulatory Visit (INDEPENDENT_AMBULATORY_CARE_PROVIDER_SITE_OTHER): Payer: PPO | Admitting: *Deleted

## 2015-08-30 ENCOUNTER — Telehealth: Payer: Self-pay | Admitting: Internal Medicine

## 2015-08-30 DIAGNOSIS — R300 Dysuria: Secondary | ICD-10-CM

## 2015-08-30 LAB — URINALYSIS, ROUTINE W REFLEX MICROSCOPIC
Bilirubin Urine: NEGATIVE
Hgb urine dipstick: NEGATIVE
KETONES UR: NEGATIVE
NITRITE: POSITIVE — AB
SPECIFIC GRAVITY, URINE: 1.01 (ref 1.000–1.030)
TOTAL PROTEIN, URINE-UPE24: NEGATIVE
URINE GLUCOSE: NEGATIVE
UROBILINOGEN UA: 0.2 (ref 0.0–1.0)
pH: 5.5 (ref 5.0–8.0)

## 2015-08-30 LAB — POCT URINALYSIS DIPSTICK
BILIRUBIN UA: NEGATIVE
GLUCOSE UA: NEGATIVE
KETONES UA: NEGATIVE
Nitrite, UA: POSITIVE
PROTEIN UA: NEGATIVE
SPEC GRAV UA: 1.01
Urobilinogen, UA: 0.2
pH, UA: 6

## 2015-09-01 NOTE — Telephone Encounter (Signed)
Patient came in for UA for possible UTI

## 2015-09-02 ENCOUNTER — Other Ambulatory Visit: Payer: Self-pay | Admitting: Internal Medicine

## 2015-09-02 LAB — URINE CULTURE

## 2015-09-02 MED ORDER — FOSFOMYCIN TROMETHAMINE 3 G PO PACK: 3.0000 g | PACK | Freq: Once | ORAL | Status: DC

## 2015-09-03 ENCOUNTER — Telehealth: Payer: Self-pay | Admitting: Internal Medicine

## 2015-09-03 NOTE — Telephone Encounter (Signed)
Evelyn [name] called from [facility] regarding pt anitbotic called into the pharmacy that the pt does not use. She states if possible to send prescription to facility. Remove the two pharmacies on file. Thank you!

## 2015-09-03 NOTE — Telephone Encounter (Signed)
Order faxed over to nursing home

## 2015-11-02 ENCOUNTER — Telehealth: Payer: Self-pay | Admitting: *Deleted

## 2015-11-02 NOTE — Telephone Encounter (Signed)
Santiago Glad from [place], had requested a UA order, due to patient having confusion.  Fax [phone]

## 2015-11-02 NOTE — Telephone Encounter (Signed)
Spoke with Evelyn [name] at the facility.  Patient has had increased confusion with not just staff but family in the past few days.  No actual complaints of urinary issues noted.   Please advise. Thanks

## 2015-11-02 NOTE — Telephone Encounter (Signed)
Evelyn [name] from [facility] stated that family is requesting a Ua on patient because they feel she has increased confusion, patient has not complained of any urinary symptoms. [facility] physician would not treat unless patient is having symptoms. Facility stated will advise family.

## 2015-11-02 NOTE — Telephone Encounter (Signed)
Left a VM for Evelyn [name] to return my call to get further information. Thanks

## 2015-11-02 NOTE — Telephone Encounter (Signed)
Given her age and her history of untreatable UTI's, I would not treat unless she is having urinary symptoms

## 2015-11-16 ENCOUNTER — Ambulatory Visit: Payer: PPO | Admitting: Podiatry

## 2015-11-22 ENCOUNTER — Ambulatory Visit: Payer: PPO | Admitting: Podiatry

## 2015-12-03 ENCOUNTER — Ambulatory Visit (INDEPENDENT_AMBULATORY_CARE_PROVIDER_SITE_OTHER): Payer: PPO | Admitting: Sports Medicine

## 2015-12-03 ENCOUNTER — Encounter: Payer: Self-pay | Admitting: Sports Medicine

## 2015-12-03 DIAGNOSIS — L84 Corns and callosities: Secondary | ICD-10-CM | POA: Diagnosis not present

## 2015-12-03 DIAGNOSIS — M79676 Pain in unspecified toe(s): Secondary | ICD-10-CM

## 2015-12-03 DIAGNOSIS — B351 Tinea unguium: Secondary | ICD-10-CM | POA: Diagnosis not present

## 2015-12-03 NOTE — Progress Notes (Signed)
Patient ID: LAMANI LEIST, female   DOB: 05/01/1920, 82 y.o.   MRN: [id number] Subjective: CARSHENA SHAIKH is a 82 y.o. female patient seen today in office with complaint of painful corn and thickened and elongated toenails; unable to trim. Patient denies history of Diabetes, Neuropathy, or Vascular disease. Patient has no other pedal complaints at this time.   Patient is assisted by staff member from [place].   Patient Active Problem List   Diagnosis Date Noted  . Unsteadiness 07/14/2015  . Anorexia 06/22/2015  . Loss of weight 06/22/2015  . Complicated UTI (urinary tract infection) 05/07/2015  . Syncope 05/05/2015  . Chronic diastolic heart failure ([place]) 03/04/2015  . Acute on chronic diastolic heart failure ([place]) 06/04/2014  . Paroxysmal atrial fibrillation ([place]) 05/29/2014  . Candidiasis of perineum 05/27/2014  . Tachycardia 05/27/2014  . Diarrhea 12/18/2013  . Rectal pain 12/14/2013  . ESBL (extended spectrum beta-lactamase) producing bacteria infection 12/09/2013  . Unspecified vitamin D deficiency 11/25/2013  . Chronic insomnia 11/23/2013  . History of recent fall 11/23/2013  . Bradycardia 04/10/2013  . Generalized muscle weakness 04/10/2013  . Anemia, iron deficiency 12/27/2012  . Dysphagia, pharyngoesophageal phase 07/06/2012  . Anemia, pernicious 10/15/2011  . Spinal stenosis of lumbar region 10/15/2011  . Hypertension   . Hyperlipidemia   . Hypothyroidism   . Other chronic cystitis   . History of lymphoma   . Urinary tract infection 07/14/2011    Current Outpatient Prescriptions on File Prior to Visit  Medication Sig Dispense Refill  . apixaban (ELIQUIS) 2.5 MG TABS tablet Take 2.5 mg by mouth 2 (two) times daily.    . fosfomycin (MONUROL) 3 g PACK Take 3 g by mouth once. 3 g 0  . levothyroxine (SYNTHROID, LEVOTHROID) 100 MCG tablet Take 100 mcg by mouth daily.    . Methen-Hyosc-Meth Blue-Na Phos (UROGESIC-BLUE) 81.6 MG TABS Take 1 tablet by mouth 3  (three) times daily.    . mirtazapine (REMERON) 7.5 MG tablet Take 1 tablet (7.5 mg total) by mouth at bedtime. 90 tablet 1  . Multiple Vitamins-Minerals (MULTIVITAMIN WITH MINERALS) tablet Take 1 tablet by mouth daily.    Marland Kitchen omeprazole (PRILOSEC) 20 MG capsule Take 20 mg by mouth daily.    . propranolol (INDERAL) 10 MG tablet Take 10 mg by mouth 2 (two) times daily.    . temazepam (RESTORIL) 15 MG capsule Take 15 mg by mouth at bedtime as needed for sleep.    Marland Kitchen URELLE (URELLE/URISED) 81 MG TABS tablet Take 1 tablet by mouth 3 (three) times daily.    . vitamin B-12 (CYANOCOBALAMIN) 1000 MCG tablet Take 1,000 mcg by mouth daily.    Marland Kitchen zolpidem (AMBIEN) 10 MG tablet Take 10 mg by mouth at bedtime as needed for sleep.    . [DISCONTINUED] esomeprazole (NEXIUM) 40 MG capsule Take 1 capsule (40 mg total) by mouth daily. 30 capsule 6   No current facility-administered medications on file prior to visit.    Allergies  Allergen Reactions  . Oxycodone Nausea Only  . Penicillins Swelling, Rash and Other (See Comments)    Pt is unable to answer additional questions about this medication.    . Prednisone Rash  . Sulfa Antibiotics Swelling and Rash    Objective: Physical Exam  General: Well developed, nourished, no acute distress, awake, alert and oriented x 3  Vascular: Dorsalis pedis artery 1/4 bilateral, Posterior tibial artery 1/4 bilateral, skin temperature warm to warm proximal to distal bilateral lower extremities, +  varicosities, no pedal hair present bilateral.  Neurological: Gross sensation present via light touch bilateral.   Dermatological: Skin is warm, dry, and supple bilateral, Nails 1-10 are tender, long, thick, and discolored with mild subungal debris, dry blood underneath left 2nd toenail with no signs of infection or lysis , no webspace macerations present bilateral, no open lesions present bilateral, + hyperkeratotic tissue present left 5th toe dorsal aspect. No signs of infection  bilateral.  Musculoskeletal: Bunion and hammertoes bilateral with fat pad atrophy. Muscular strength within normal limits without pain on range of motion. No pain with calf compression bilateral.  Assessment and Plan:  Problem List Items Addressed This Visit    None    Visit Diagnoses    Corns and callosities    -  Primary    Dermatophytosis of nail        Pain of toe, unspecified laterality          -Examined patient.  -Discussed treatment options for painful callus and mycotic nails. -Mechanically debrided callus using chisel blade and reduced mycotic nails with sterile nail nipper and dremel nail file without incident. -Gave silicone toe protector for left 5th toe and instructed on use -Recommend good supportive shoes daily for foot type -Patient to return in as needed for follow up evaluation or sooner if symptoms worsen.  Landis Martins, DPM

## 2016-01-03 DIAGNOSIS — I10 Essential (primary) hypertension: Secondary | ICD-10-CM | POA: Diagnosis not present

## 2016-01-03 DIAGNOSIS — I5033 Acute on chronic diastolic (congestive) heart failure: Secondary | ICD-10-CM | POA: Diagnosis not present

## 2016-01-03 DIAGNOSIS — E782 Mixed hyperlipidemia: Secondary | ICD-10-CM | POA: Diagnosis not present

## 2016-01-03 DIAGNOSIS — I340 Nonrheumatic mitral (valve) insufficiency: Secondary | ICD-10-CM | POA: Diagnosis not present

## 2016-01-03 DIAGNOSIS — I5032 Chronic diastolic (congestive) heart failure: Secondary | ICD-10-CM | POA: Diagnosis not present

## 2016-01-03 DIAGNOSIS — R2681 Unsteadiness on feet: Secondary | ICD-10-CM | POA: Diagnosis not present

## 2016-01-03 DIAGNOSIS — R1030 Lower abdominal pain, unspecified: Secondary | ICD-10-CM | POA: Diagnosis not present

## 2016-01-03 DIAGNOSIS — I480 Paroxysmal atrial fibrillation: Secondary | ICD-10-CM | POA: Diagnosis not present

## 2016-01-03 DIAGNOSIS — I25118 Atherosclerotic heart disease of native coronary artery with other forms of angina pectoris: Secondary | ICD-10-CM | POA: Diagnosis not present

## 2016-01-03 DIAGNOSIS — I6523 Occlusion and stenosis of bilateral carotid arteries: Secondary | ICD-10-CM | POA: Diagnosis not present

## 2016-01-04 ENCOUNTER — Inpatient Hospital Stay (HOSPITAL_BASED_OUTPATIENT_CLINIC_OR_DEPARTMENT_OTHER): Payer: PPO | Admitting: Oncology

## 2016-01-04 ENCOUNTER — Encounter: Payer: Self-pay | Admitting: Oncology

## 2016-01-04 ENCOUNTER — Inpatient Hospital Stay: Payer: PPO | Attending: Oncology

## 2016-01-04 VITALS — BP 161/87 | HR 73 | Temp 97.8°F | Resp 18 | Wt 132.7 lb

## 2016-01-04 DIAGNOSIS — Z8579 Personal history of other malignant neoplasms of lymphoid, hematopoietic and related tissues: Secondary | ICD-10-CM

## 2016-01-04 DIAGNOSIS — E079 Disorder of thyroid, unspecified: Secondary | ICD-10-CM | POA: Insufficient documentation

## 2016-01-04 DIAGNOSIS — Z79899 Other long term (current) drug therapy: Secondary | ICD-10-CM

## 2016-01-04 DIAGNOSIS — I2510 Atherosclerotic heart disease of native coronary artery without angina pectoris: Secondary | ICD-10-CM | POA: Diagnosis not present

## 2016-01-04 DIAGNOSIS — E785 Hyperlipidemia, unspecified: Secondary | ICD-10-CM | POA: Diagnosis not present

## 2016-01-04 DIAGNOSIS — Z955 Presence of coronary angioplasty implant and graft: Secondary | ICD-10-CM | POA: Insufficient documentation

## 2016-01-04 DIAGNOSIS — Z8572 Personal history of non-Hodgkin lymphomas: Secondary | ICD-10-CM

## 2016-01-04 DIAGNOSIS — Z87891 Personal history of nicotine dependence: Secondary | ICD-10-CM | POA: Insufficient documentation

## 2016-01-04 LAB — COMPREHENSIVE METABOLIC PANEL
ALBUMIN: 4.1 g/dL (ref 3.5–5.0)
ALT: 11 U/L — AB (ref 14–54)
ANION GAP: 7 (ref 5–15)
AST: 16 U/L (ref 15–41)
Alkaline Phosphatase: 67 U/L (ref 38–126)
BUN: 24 mg/dL — ABNORMAL HIGH (ref 6–20)
CHLORIDE: 106 mmol/L (ref 101–111)
CO2: 26 mmol/L (ref 22–32)
Calcium: 9 mg/dL (ref 8.9–10.3)
Creatinine, Ser: 1.02 mg/dL — ABNORMAL HIGH (ref 0.44–1.00)
GFR calc non Af Amer: 46 mL/min — ABNORMAL LOW (ref >60)
GFR, EST AFRICAN AMERICAN: 53 mL/min — AB (ref >60)
GLUCOSE: 107 mg/dL — AB (ref 65–99)
Potassium: 4.3 mmol/L (ref 3.5–5.1)
SODIUM: 139 mmol/L (ref 135–145)
Total Bilirubin: 0.6 mg/dL (ref 0.3–1.2)
Total Protein: 7.5 g/dL (ref 6.5–8.1)

## 2016-01-04 LAB — CBC WITH DIFFERENTIAL/PLATELET
Basophils Absolute: 0.1 10*3/uL (ref 0–0.1)
Basophils Relative: 1 %
Eosinophils Absolute: 0.7 10*3/uL (ref 0–0.7)
Eosinophils Relative: 8 %
HCT: 36.1 % (ref 35.0–47.0)
HEMOGLOBIN: 12 g/dL (ref 12.0–16.0)
LYMPHS ABS: 2.6 10*3/uL (ref 1.0–3.6)
LYMPHS PCT: 31 %
MCH: 28.3 pg (ref 26.0–34.0)
MCHC: 33.3 g/dL (ref 32.0–36.0)
MCV: 85.2 fL (ref 80.0–100.0)
Monocytes Absolute: 0.9 10*3/uL (ref 0.2–0.9)
Monocytes Relative: 10 %
NEUTROS PCT: 50 %
Neutro Abs: 4.3 10*3/uL (ref 1.4–6.5)
Platelets: 374 10*3/uL (ref 150–440)
RBC: 4.24 MIL/uL (ref 3.80–5.20)
RDW: 16.4 % — ABNORMAL HIGH (ref 11.5–14.5)
WBC: 8.5 10*3/uL (ref 3.6–11.0)

## 2016-01-04 NOTE — Progress Notes (Signed)
[facility] Telephone:([phone]  Fax:(336) [place]: 12-26-1919  MR#: [id number]  FWY#:[id number]  Patient Care Team: Crecencio Mc, MD as PCP - General (Internal Medicine)  CHIEF COMPLAINT: Oncology history Chief Complaint  Patient presents with  . Lymphoma   Chief Complaint/Diagnosis:   1. Follicular low-grade lymphoma, on, diagnosis.  In 1987 2. Patient had multiple recurrences.  And has received chemotherapy  No recurrence since 2004.  No history exists.    Oncology Flowsheet 01/25/2012 01/26/2012 12/31/2012 01/09/2013 01/14/2013 05/27/2014 05/07/2015  cyanocobalamin ((VITAMIN B-12)) IM - - 1,000 mcg 1,000 mcg 1,000 mcg 1,000 mcg -  diazepam (VALIUM) PO 5 mg 5 mg - - - - -  ondansetron (ZOFRAN) IJ - - - - - - -  ondansetron (ZOFRAN) IV - - - - - - 4 mg    INTERVAL HISTORY: 82 year old lady who lives in assisted living facility.  Had multiple hospital admission with bladder infection later on was transferred to assisted living facility.  Patient's general condition has been declining due to multiple comorbidities condition.  Patient is now being followed here for regarding follicular lymphoma.  No chills no fever.  Continues to her low back pain and recurrent bladder infection..  Patient lives in assisted living facility. Gradual decline because of old age and multiple other medical problems Walking with the help of walker REVIEW OF SYSTEMS:    general status: Patient is feeling weak and tired.  No change in a performance status.  No chills.  No fever. HEENT:  No evidence of stomatitis Lungs: No cough or shortness of breath Cardiac: No chest pain or paroxysmal nocturnal dyspnea GI: No nausea no vomiting no diarrhea no abdominal pain Skin: No rash Lower extremity no swelling Neurological system: No tingling.  No numbness.  No other focal signs Musculoskeletal system no bony pains  As per HPI. Otherwise, a complete review of systems is  negatve.  PAST MEDICAL HISTORY: Past Medical History  Diagnosis Date  . Hyperlipidemia   . Thyroid disease     had thyroid removed  . Other chronic cystitis     Radiation , botox injections  . History of lymphoma   . Self-catheterizes urinary bladder     twice daily  . Coronary artery disease   . Coarse tremors     head shakes if does not take propranolol  . Insomnia     takes remeron nightly  . Cancer ([place])     lymphomia  . Anemia     hx of blood transfusion    PAST SURGICAL HISTORY: Past Surgical History  Procedure Laterality Date  . Cardiac catheterization  2005    2 cardiac stents  . Abdominal hysterectomy  1970s  . Eye surgery      bilateral cataracts  . Joint replacement  2012    right hip  . Stomach surgery      removal all but small portion of stomach out due to ulcers  . Appendectomy    . Back surgery      lower back - bulging disk  . Spine surgery  2008    [place]  . Lumbar laminectomy/decompression microdiscectomy  01/24/2012    Procedure: LUMBAR LAMINECTOMY/DECOMPRESSION MICRODISCECTOMY 2 LEVELS;  Surgeon: Floyce Stakes, MD;  Location: [facility];  Service: Neurosurgery;  Laterality: Right;  Right Lumbar four five, lumbar five sacral one foraminotomy   . Cystoscopy  April 2013    FAMILY HISTORY Family  History  Problem Relation Age of Onset  . Diabetes Mother   . Heart disease Father     ADVANCED DIRECTIVES:  No flowsheet data found.  HEALTH MAINTENANCE: Social History  Substance Use Topics  . Smoking status: Former Smoker    Quit date: 04/04/1971  . Smokeless tobacco: Never Used  . Alcohol Use: No      Allergies  Allergen Reactions  . Oxycodone Nausea Only  . Penicillins Swelling, Rash and Other (See Comments)    Pt is unable to answer additional questions about this medication.    . Prednisone Rash  . Sulfa Antibiotics Swelling and Rash    Current Outpatient Prescriptions  Medication Sig Dispense Refill  . apixaban  (ELIQUIS) 2.5 MG TABS tablet Take 2.5 mg by mouth 2 (two) times daily.    Marland Kitchen levothyroxine (SYNTHROID, LEVOTHROID) 100 MCG tablet Take 100 mcg by mouth daily.    Marland Kitchen loperamide (IMODIUM A-D) 2 MG tablet Take 4 mg by mouth 4 (four) times daily as needed for diarrhea or loose stools.    . mirtazapine (REMERON) 7.5 MG tablet Take 1 tablet (7.5 mg total) by mouth at bedtime. 90 tablet 1  . Multiple Vitamins-Minerals (MULTIVITAMIN WITH MINERALS) tablet Take 1 tablet by mouth daily.    Marland Kitchen omeprazole (PRILOSEC) 20 MG capsule Take 20 mg by mouth daily.    . propranolol (INDERAL) 20 MG tablet Take 10 mg by mouth 2 (two) times daily.    . vitamin B-12 (CYANOCOBALAMIN) 1000 MCG tablet Take 1,000 mcg by mouth daily.    Marland Kitchen zolpidem (AMBIEN) 5 MG tablet Take 5 mg by mouth at bedtime as needed for sleep.    . [DISCONTINUED] esomeprazole (NEXIUM) 40 MG capsule Take 1 capsule (40 mg total) by mouth daily. 30 capsule 6   No current facility-administered medications for this visit.    OBJECTIVE:  Filed Vitals:   01/04/16 1446 01/04/16 1449  BP: 176/84 161/87  Pulse: 79 73  Temp: 97.8 F (36.6 C)   Resp: 18      Body mass index is 24.27 kg/(m^2).    ECOG FS:2 - Symptomatic, <50% confined to bed  PHYSICAL EXAM: Gen. status: Patient is alert oriented walking with the help of walker. Lymphatic system: Supraclavicular, cervical, axillary, inguinal lymph nodes are not palpable Cardiac: Soft systolic murmur.  Occasionally irregular heart sounds Abdominal exam revealed normal bowel sounds. The abdomen was soft, non-tender, and without masses, organomegaly, or appreciable enlargement of the abdominal aorta. Lower extremity trace edema Examination of the skin revealed no evidence of significant rashes, suspicious appearing nevi or other concerning lesions. Neurologically, the patient was awake, alert, and oriented to person, place and time. There were no obvious focal neurologic abnormalities. All other systems have  been examined   LAB RESULTS:  CBC Latest Ref Rng 01/04/2016 07/06/2015  WBC 3.6 - 11.0 K/uL 8.5 9.4  Hemoglobin 12.0 - 16.0 g/dL 12.0 12.0  Hematocrit 35.0 - 47.0 % 36.1 36.4  Platelets 150 - 440 K/uL 374 327    Appointment on 01/04/2016  Component Date Value Ref Range Status  . WBC 01/04/2016 8.5  3.6 - 11.0 K/uL Final  . RBC 01/04/2016 4.24  3.80 - 5.20 MIL/uL Final  . Hemoglobin 01/04/2016 12.0  12.0 - 16.0 g/dL Final  . HCT 01/04/2016 36.1  35.0 - 47.0 % Final  . MCV 01/04/2016 85.2  80.0 - 100.0 fL Final  . MCH 01/04/2016 28.3  26.0 - 34.0 pg Final  . MCHC 01/04/2016  33.3  32.0 - 36.0 g/dL Final  . RDW 01/04/2016 16.4* 11.5 - 14.5 % Final  . Platelets 01/04/2016 374  150 - 440 K/uL Final  . Neutrophils Relative % 01/04/2016 50   Final  . Neutro Abs 01/04/2016 4.3  1.4 - 6.5 K/uL Final  . Lymphocytes Relative 01/04/2016 31   Final  . Lymphs Abs 01/04/2016 2.6  1.0 - 3.6 K/uL Final  . Monocytes Relative 01/04/2016 10   Final  . Monocytes Absolute 01/04/2016 0.9  0.2 - 0.9 K/uL Final  . Eosinophils Relative 01/04/2016 8   Final  . Eosinophils Absolute 01/04/2016 0.7  0 - 0.7 K/uL Final  . Basophils Relative 01/04/2016 1   Final  . Basophils Absolute 01/04/2016 0.1  0 - 0.1 K/uL Final  . Sodium 01/04/2016 139  135 - 145 mmol/L Final  . Potassium 01/04/2016 4.3  3.5 - 5.1 mmol/L Final  . Chloride 01/04/2016 106  101 - 111 mmol/L Final  . CO2 01/04/2016 26  22 - 32 mmol/L Final  . Glucose, Bld 01/04/2016 107* 65 - 99 mg/dL Final  . BUN 01/04/2016 24* 6 - 20 mg/dL Final  . Creatinine, Ser 01/04/2016 1.02* 0.44 - 1.00 mg/dL Final  . Calcium 01/04/2016 9.0  8.9 - 10.3 mg/dL Final  . Total Protein 01/04/2016 7.5  6.5 - 8.1 g/dL Final  . Albumin 01/04/2016 4.1  3.5 - 5.0 g/dL Final  . AST 01/04/2016 16  15 - 41 U/L Final  . ALT 01/04/2016 11* 14 - 54 U/L Final  . Alkaline Phosphatase 01/04/2016 67  38 - 126 U/L Final  . Total Bilirubin 01/04/2016 0.6  0.3 - 1.2 mg/dL Final  .  GFR calc non Af Amer 01/04/2016 46* >60 mL/min Final  . GFR calc Af Amer 01/04/2016 53* >60 mL/min Final   Comment: (NOTE) The eGFR has been calculated using the CKD EPI equation. This calculation has not been validated in all clinical situations. eGFR's persistently <60 mL/min signify possible Chronic Kidney Disease.   . Anion gap 01/04/2016 7  5 - 15 Final        ASSESSMENT: Follicular lymphoma No evidence of recurrent or progressive disease at present time  MEDICAL DECISION MAKING:  All lab data has been reviewed There is no clinical evidence of recurrent disease.  Detail investigation with CT scan has not been done as there is no clinical indication.   No further follow-up at [place] planned She will be followed by primary care physician All lab data has been reviewed Patient expressed understanding and was in agreement with this plan. She also understands that She can call clinic at any time with any questions, concerns, or complaints.    No matching staging information was found for the patient.  Forest Gleason, MD   01/04/2016 3:04 PM

## 2016-01-05 ENCOUNTER — Telehealth: Payer: Self-pay | Admitting: Internal Medicine

## 2016-01-05 NOTE — Telephone Encounter (Signed)
Santiago Glad [phone] from [place] regarding pt needing a antibiotic called in due to having a UTI pt went to [facility] on 06/05 and they did lab work and urinalysis. Fax Rx to [phone]. Thank you!

## 2016-01-05 NOTE — Telephone Encounter (Signed)
Ok. Thank you!t

## 2016-01-05 NOTE — Telephone Encounter (Signed)
[facility] should be prescribing the antibiotic since they saw the patient for the UTI and have the results

## 2016-01-07 DIAGNOSIS — N390 Urinary tract infection, site not specified: Secondary | ICD-10-CM | POA: Diagnosis not present

## 2016-01-07 DIAGNOSIS — N39498 Other specified urinary incontinence: Secondary | ICD-10-CM | POA: Diagnosis not present

## 2016-01-17 ENCOUNTER — Telehealth: Payer: Self-pay | Admitting: *Deleted

## 2016-01-17 DIAGNOSIS — R300 Dysuria: Secondary | ICD-10-CM

## 2016-01-17 NOTE — Telephone Encounter (Signed)
Attempted to call karen back but line was in a busy status. Will try again tomorrow morning.

## 2016-01-17 NOTE — Telephone Encounter (Signed)
Santiago Glad from [place] stated that patient is having more confusion and incontinence. She's having some swelling in feet and her left leg. Santiago Glad stated that patient has a  Macrobid 100 mg Rx written by another physician,that has not worked.  Santiago Glad [phone]

## 2016-01-18 NOTE — Telephone Encounter (Signed)
OK to have facility collect urine for UA and culture.I have entered.

## 2016-01-18 NOTE — Telephone Encounter (Signed)
Yes ok to do,  Patient needs to see me every 6 months for me to treat UTis without seeing. Her last visit was November.

## 2016-01-18 NOTE — Telephone Encounter (Signed)
Spoke to Evelyn [name], patient has also been complaining of pain where her bladder is.  Patient is also wetting herself acting very confused. Please advise

## 2016-01-18 NOTE — Telephone Encounter (Signed)
Facility is follow ing up with Dr. Ola Spurr for uti, and patient will follow up here with MD on Monday.

## 2016-01-18 NOTE — Addendum Note (Signed)
Addended by: Nanci Pina on: 01/18/2016 10:34 AM   Modules accepted: Orders

## 2016-01-24 ENCOUNTER — Ambulatory Visit (INDEPENDENT_AMBULATORY_CARE_PROVIDER_SITE_OTHER): Payer: PPO | Admitting: Internal Medicine

## 2016-01-25 ENCOUNTER — Encounter: Payer: Self-pay | Admitting: Internal Medicine

## 2016-01-25 ENCOUNTER — Encounter: Payer: Self-pay | Admitting: *Deleted

## 2016-01-25 ENCOUNTER — Ambulatory Visit (INDEPENDENT_AMBULATORY_CARE_PROVIDER_SITE_OTHER): Payer: PPO | Admitting: Internal Medicine

## 2016-01-25 VITALS — BP 150/88 | HR 91 | Temp 97.6°F | Resp 12 | Ht 62.0 in | Wt 132.5 lb

## 2016-01-25 DIAGNOSIS — M25512 Pain in left shoulder: Secondary | ICD-10-CM | POA: Diagnosis not present

## 2016-01-25 DIAGNOSIS — D239 Other benign neoplasm of skin, unspecified: Secondary | ICD-10-CM

## 2016-01-25 DIAGNOSIS — R601 Generalized edema: Secondary | ICD-10-CM

## 2016-01-25 DIAGNOSIS — R2681 Unsteadiness on feet: Secondary | ICD-10-CM

## 2016-01-25 DIAGNOSIS — R600 Localized edema: Secondary | ICD-10-CM

## 2016-01-25 DIAGNOSIS — N3020 Other chronic cystitis without hematuria: Secondary | ICD-10-CM

## 2016-01-25 DIAGNOSIS — M6281 Muscle weakness (generalized): Secondary | ICD-10-CM

## 2016-01-25 DIAGNOSIS — R300 Dysuria: Secondary | ICD-10-CM | POA: Diagnosis not present

## 2016-01-25 DIAGNOSIS — E038 Other specified hypothyroidism: Secondary | ICD-10-CM

## 2016-01-25 DIAGNOSIS — R609 Edema, unspecified: Secondary | ICD-10-CM

## 2016-01-25 DIAGNOSIS — E034 Atrophy of thyroid (acquired): Secondary | ICD-10-CM

## 2016-01-25 DIAGNOSIS — R5383 Other fatigue: Secondary | ICD-10-CM | POA: Diagnosis not present

## 2016-01-25 DIAGNOSIS — I10 Essential (primary) hypertension: Secondary | ICD-10-CM

## 2016-01-25 LAB — POCT URINALYSIS DIPSTICK
BILIRUBIN UA: NEGATIVE
Blood, UA: NEGATIVE
Glucose, UA: NEGATIVE
KETONES UA: NEGATIVE
Nitrite, UA: NEGATIVE
PH UA: 5.5
Protein, UA: NEGATIVE
SPEC GRAV UA: 1.01
Urobilinogen, UA: 0.2

## 2016-01-25 MED ORDER — PROPRANOLOL HCL 20 MG PO TABS: 30.0000 mg | ORAL_TABLET | Freq: Two times a day (BID) | ORAL | Status: DC

## 2016-01-25 NOTE — Patient Instructions (Signed)
Dermatology referral in process  To evaluate the skin lesion on left thigh  Increase propranolol to 30 mg twice daily for high blood pressure  Elevate legs as much as possible   Compression knee highs worn daily will help,  You can purchase them at [facility] ; you will need to take Ms Odoherty with you for a fitting   Physical Therapy at [facility] has been ordered to help your left shoulder and posture   If your urine culture is positive for infection I will call in an antibiotic

## 2016-01-25 NOTE — Progress Notes (Signed)
Subjective:  Patient ID: Evelyn [name], female    DOB: 1920-03-01  Age: 82 y.o. MRN: [id number]  CC: The primary encounter diagnosis was Dysuria. Diagnoses of Skin benign neoplasm, Left shoulder pain, Hypothyroidism due to acquired atrophy of thyroid, Other fatigue, Generalized edema, Generalized muscle weakness, Cystitis, chronic, Essential hypertension, Unsteadiness, and Edema extremities were also pertinent to this visit.  HPI Evelyn [name] presents for evaluation of dysuria.   Last seen one year ago. She has been under the care of Dr Ola Spurr for recent Klebsiella oxytoca  UTI  Pan sensitive except to ampicillin and cefazolin .  She was treated  with Macrobid x 10 days . Per son, she never stopped reporting dysuria  polyuria and acute mental status canges. per son. AShe was also prescribed  Vaginal estrogen to be applied every other day b y the staff   Repeat urine collected today , dipstick shows small leukocytes , no gluocse,  SG 1.010  Hard of hearing, lost one of her hearing aids.  BP elevated today   Bilateral edema, for the last several days,  EF 60% by Oct 2016 ECHO with moderate LVH . Improves with leg elevation.      Rough skin lesion left medial thigh.  Think it has been there a week or more , itchy.   lelft shoulder pian,  Walks hunched over using a rolling walker.  Having trouble standing up straight up,  Has a long walk to the dining hall.     Ad[facility] to [facility] October with syncope and AMS and vomiting, ESBL UTI    Outpatient Prescriptions Prior to Visit  Medication Sig Dispense Refill  . apixaban (ELIQUIS) 2.5 MG TABS tablet Take 2.5 mg by mouth 2 (two) times daily.    Marland Kitchen levothyroxine (SYNTHROID, LEVOTHROID) 100 MCG tablet Take 100 mcg by mouth daily.    Marland Kitchen loperamide (IMODIUM A-D) 2 MG tablet Take 4 mg by mouth 4 (four) times daily as needed for diarrhea or loose stools.    . mirtazapine (REMERON) 7.5 MG tablet Take 1 tablet (7.5 mg total) by mouth at  bedtime. 90 tablet 1  . Multiple Vitamins-Minerals (MULTIVITAMIN WITH MINERALS) tablet Take 1 tablet by mouth daily.    Marland Kitchen omeprazole (PRILOSEC) 20 MG capsule Take 20 mg by mouth daily.    . vitamin B-12 (CYANOCOBALAMIN) 1000 MCG tablet Take 1,000 mcg by mouth daily.    Marland Kitchen zolpidem (AMBIEN) 5 MG tablet Take 5 mg by mouth at bedtime as needed for sleep.    . propranolol (INDERAL) 20 MG tablet Take 10 mg by mouth 2 (two) times daily.     No facility-administered medications prior to visit.    Review of Systems;  Patient denies headache, fevers, malaise, unintentional weight loss, skin rash, eye pain, sinus congestion and sinus pain, sore throat, dysphagia,  hemoptysis , cough, dyspnea, wheezing, chest pain, palpitations, orthopnea, edema, abdominal pain, nausea, melena, diarrhea, constipation, flank pain, dysuria, hematuria, urinary  Frequency, nocturia, numbness, tingling, seizures,  Focal weakness, Loss of consciousness,  Tremor, insomnia, depression, anxiety, and suicidal ideation.      Objective:  BP 150/88 mmHg  Pulse 91  Temp(Src) 97.6 F (36.4 C) (Oral)  Resp 12  Ht 5\' 2"  (1.575 m)  Wt 132 lb 8 oz (60.102 kg)  BMI 24.23 kg/m2  SpO2 97%  BP Readings from Last 3 Encounters:  01/25/16 150/88  01/04/16 161/87  07/06/15 138/78    Wt Readings from Last 3 Encounters:  01/25/16 132 lb 8 oz (60.102 kg)  01/04/16 132 lb 11.5 oz (60.2 kg)  07/06/15 128 lb (58.06 kg)    General appearance: alert, cooperative and appears stated age Ears: normal TM's and external ear canals both ears Throat: lips, mucosa, and tongue normal; teeth and gums normal Neck: no adenopathy, no carotid bruit, supple, symmetrical, trachea midline and thyroid not enlarged, symmetric, no tenderness/mass/nodules Back: symmetric, no curvature. ROM normal. No CVA tenderness. Lungs: clear to auscultation bilaterally Heart: regular rate and rhythm, S1, S2 normal, no murmur, click, rub or gallop Abdomen: soft,  non-tender; bowel sounds normal; no masses,  no organomegaly Pulses: 2+ and symmetric Skin: left medial thigh with hyperkeratotic annular skin lesion medial thigh  Lymph nodes: Cervical, supraclavicular, and axillary nodes normal.  Lab Results  Component Value Date   HGBA1C 6.1 03/23/2014    Lab Results  Component Value Date   CREATININE 0.96 01/25/2016   CREATININE 1.02* 01/04/2016   CREATININE 0.99 07/06/2015    Lab Results  Component Value Date   WBC 8.5 01/04/2016   HGB 12.0 01/04/2016   HCT 36.1 01/04/2016   PLT 374 01/04/2016   GLUCOSE 158* 01/25/2016   CHOL 260* 03/23/2014   TRIG 268.0* 03/23/2014   HDL 43.60 03/23/2014   LDLDIRECT 210.1 03/23/2014   LDLCALC 191* 12/26/2013   ALT 10 01/25/2016   AST 13 01/25/2016   NA 138 01/25/2016   K 4.8 01/25/2016   CL 102 01/25/2016   CREATININE 0.96 01/25/2016   BUN 22 01/25/2016   CO2 28 01/25/2016   TSH 0.59 01/25/2016   INR 1.00 05/05/2015   HGBA1C 6.1 03/23/2014    No results found.  Assessment & Plan:   Problem List Items Addressed This Visit    Hypertension   Relevant Medications   propranolol (INDERAL) 20 MG tablet   Cystitis, chronic    continue vaginal estrogen and await culture results       Generalized muscle weakness    She requires use of a rolling walker to ambulate but has been leaning heavily on the bars , which has caused her shoulder to hurt.  PT evaluation in progress      Unsteadiness    Secondary to leg weakness.  PT referral made.      Edema extremities    Compression knee highs recommended and leg elevation       Skin benign neoplasm    Referral to dermatology for skin biopsy       Relevant Orders   Ambulatory referral to Dermatology   Hypothyroidism   Relevant Medications   propranolol (INDERAL) 20 MG tablet   Other Relevant Orders   TSH (Completed)    Other Visit Diagnoses    Dysuria    -  Primary    Relevant Orders    POCT Urinalysis Dipstick (Completed)    Left  shoulder pain        Relevant Orders    Ambulatory referral to Physical Therapy    Other fatigue        Relevant Orders    Comprehensive metabolic panel (Completed)    Generalized edema        Relevant Orders    For home use only DME Other see comment     A total of 40 minutes was spent with patient more than half of which was spent in counseling patient on the above mentioned issues , reviewing and explaining recent labs and imaging studies done, and coordination of care.  I have changed Ms. Ottaway's propranolol. I am also having her maintain her omeprazole, apixaban, vitamin B-12, levothyroxine, mirtazapine, multivitamin with minerals, zolpidem, loperamide, estrogens (conjugated), conjugated estrogens, and nitrofurantoin (macrocrystal-monohydrate).  Meds ordered this encounter  Medications  . estrogens, conjugated, (PREMARIN) 0.625 MG tablet    Sig: Take 0.625 mg by mouth daily. Take daily for 21 days then do not take for 7 days.  Marland Kitchen conjugated estrogens (PREMARIN) vaginal cream    Sig: Place 1 Applicatorful vaginally daily.  . nitrofurantoin, macrocrystal-monohydrate, (MACROBID) 100 MG capsule    Sig: Take 100 mg by mouth 2 (two) times daily.  . propranolol (INDERAL) 20 MG tablet    Sig: Take 1.5 tablets (30 mg total) by mouth 2 (two) times daily.    Dispense:  90 tablet    Refill:  3    Medications Discontinued During This Encounter  Medication Reason  . propranolol (INDERAL) 20 MG tablet Reorder    Follow-up: No Follow-up on file.   Crecencio Mc, MD

## 2016-01-25 NOTE — Progress Notes (Signed)
Pre-visit discussion using our clinic review tool. No additional management support is needed unless otherwise documented below in the visit note.  

## 2016-01-26 DIAGNOSIS — R600 Localized edema: Secondary | ICD-10-CM | POA: Insufficient documentation

## 2016-01-26 DIAGNOSIS — D239 Other benign neoplasm of skin, unspecified: Secondary | ICD-10-CM | POA: Insufficient documentation

## 2016-01-26 LAB — COMPREHENSIVE METABOLIC PANEL
ALK PHOS: 65 U/L (ref 39–117)
ALT: 10 U/L (ref 0–35)
AST: 13 U/L (ref 0–37)
Albumin: 3.9 g/dL (ref 3.5–5.2)
BILIRUBIN TOTAL: 0.3 mg/dL (ref 0.2–1.2)
BUN: 22 mg/dL (ref 6–23)
CO2: 28 mEq/L (ref 19–32)
Calcium: 9.1 mg/dL (ref 8.4–10.5)
Chloride: 102 mEq/L (ref 96–112)
Creatinine, Ser: 0.96 mg/dL (ref 0.40–1.20)
GFR: 57.57 mL/min — ABNORMAL LOW (ref >60.00)
GLUCOSE: 158 mg/dL — AB (ref 70–99)
POTASSIUM: 4.8 meq/L (ref 3.5–5.1)
SODIUM: 138 meq/L (ref 135–145)
TOTAL PROTEIN: 7 g/dL (ref 6.0–8.3)

## 2016-01-26 LAB — URINALYSIS, ROUTINE W REFLEX MICROSCOPIC
BILIRUBIN URINE: NEGATIVE
Hgb urine dipstick: NEGATIVE
KETONES UR: NEGATIVE
NITRITE: NEGATIVE
PH: 5.5 (ref 5.0–8.0)
Specific Gravity, Urine: 1.01 (ref 1.000–1.030)
Total Protein, Urine: NEGATIVE
Urine Glucose: NEGATIVE
Urobilinogen, UA: 0.2 (ref 0.0–1.0)

## 2016-01-26 LAB — TSH: TSH: 0.59 u[IU]/mL (ref 0.35–4.50)

## 2016-01-26 LAB — URINE CULTURE: Colony Count: >=100000

## 2016-01-26 IMAGING — US US CAROTID DUPLEX BILAT
1 series · 13 of 24 positions shown · non-contrast
Comparison: No prior duplex

CLINICAL DATA: [AGE] female with a history of syncope.

Cardiovascular risk factors include prior stroke/TIA.
EXAM:
BILATERAL CAROTID DUPLEX ULTRASOUND
TECHNIQUE: Gray scale imaging, color Doppler and duplex ultrasound were
performed of bilateral carotid and vertebral arteries in the neck.

[Series 1: us carotid duplex bilat · 0.05mm/px · 13 of 62 slices shown]
[im 1/62]
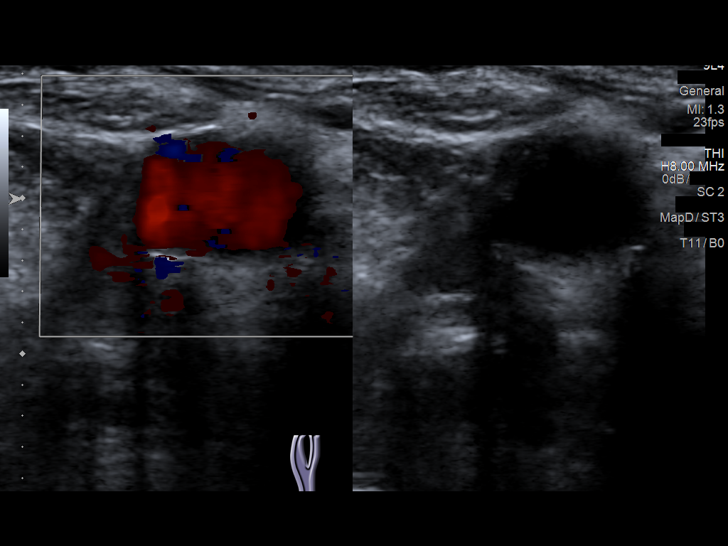
[im 6/62]
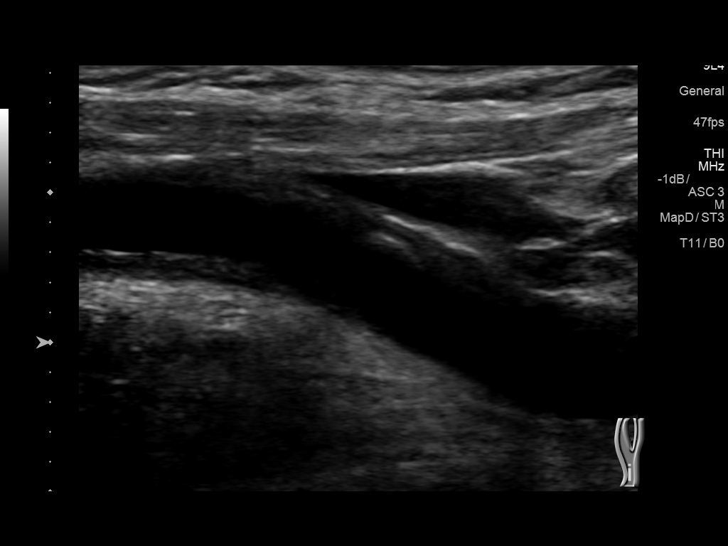
[im 11/62]
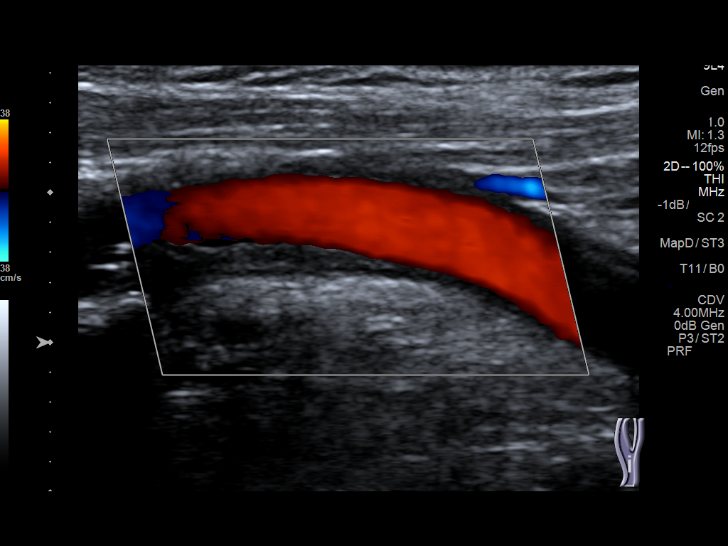
[im 16/62]
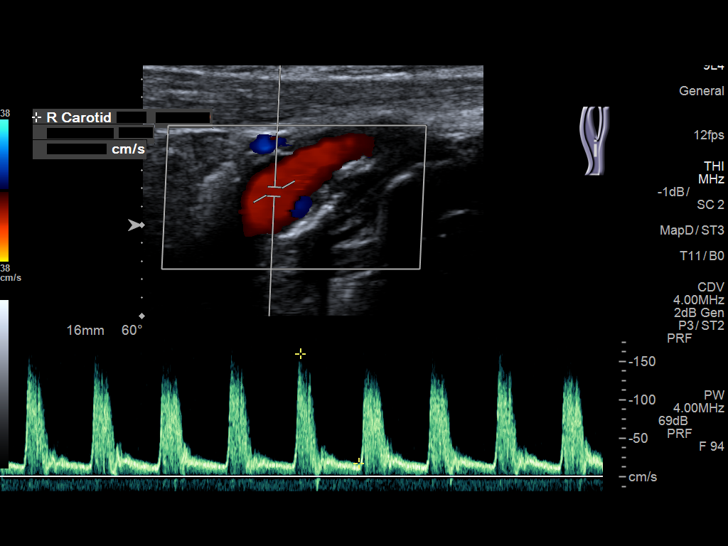
[im 22/62]
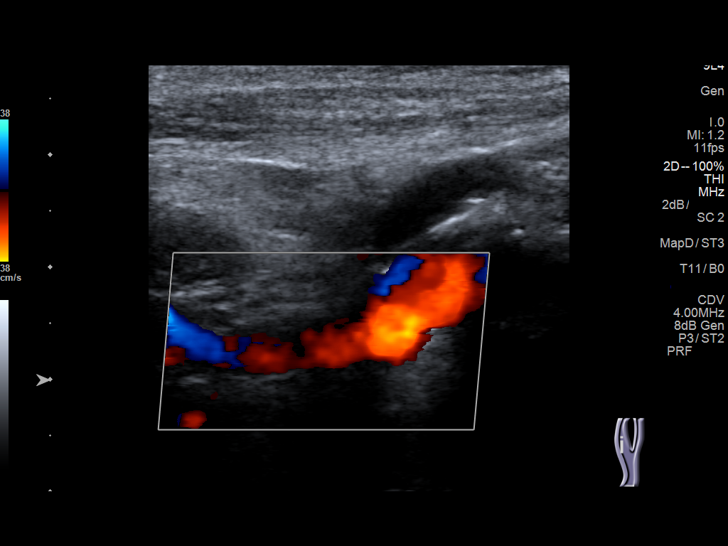
[im 27/62]
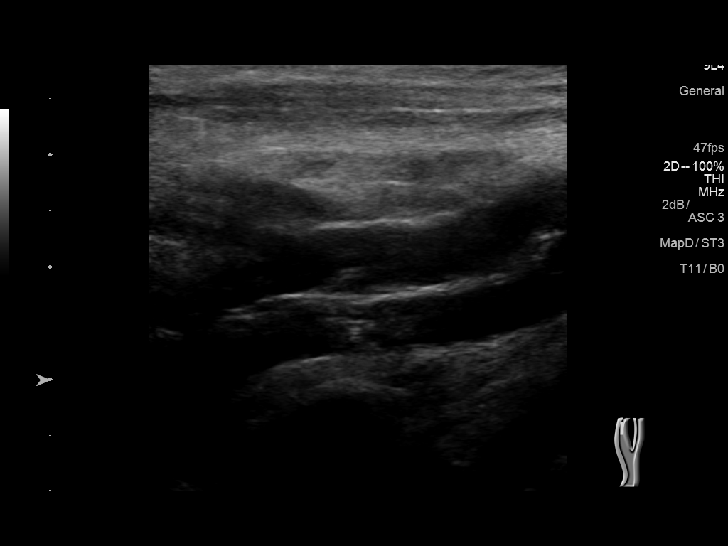
[im 32/62]
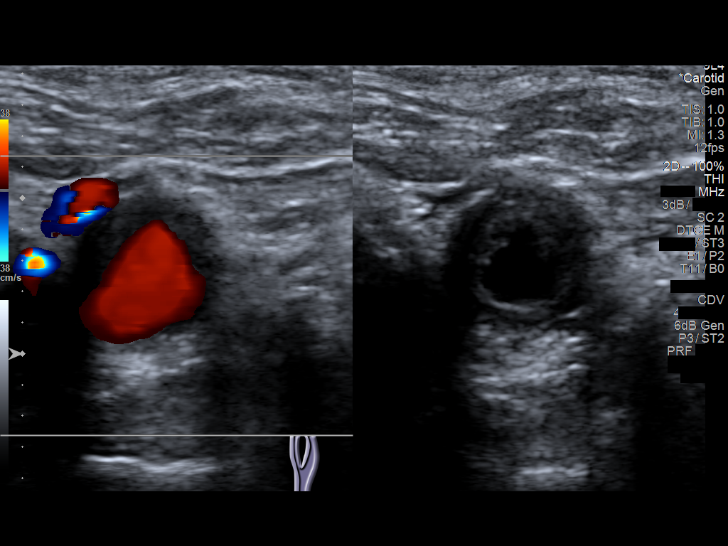
[im 35/62]
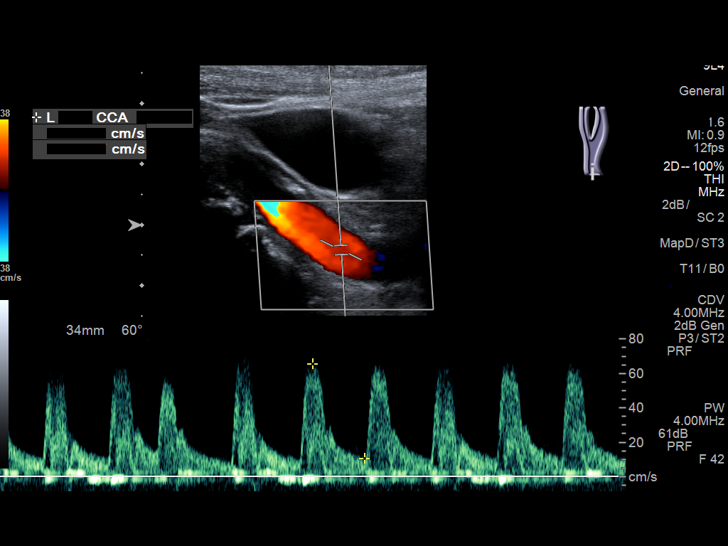
[im 40/62]
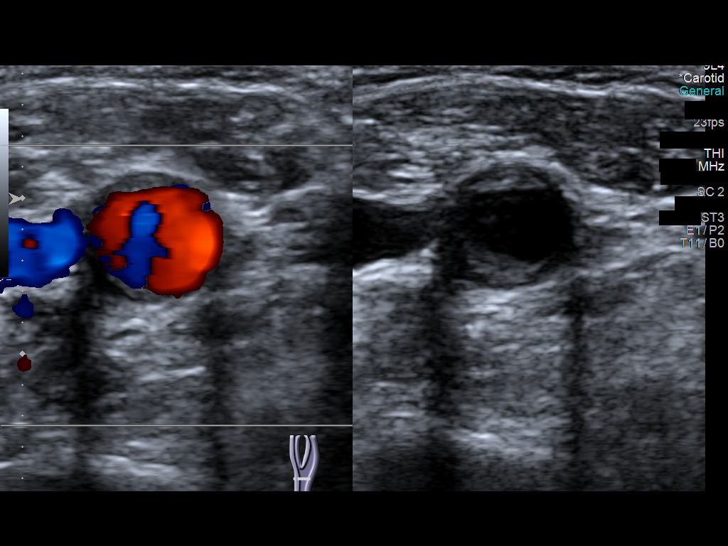
[im 46/62]
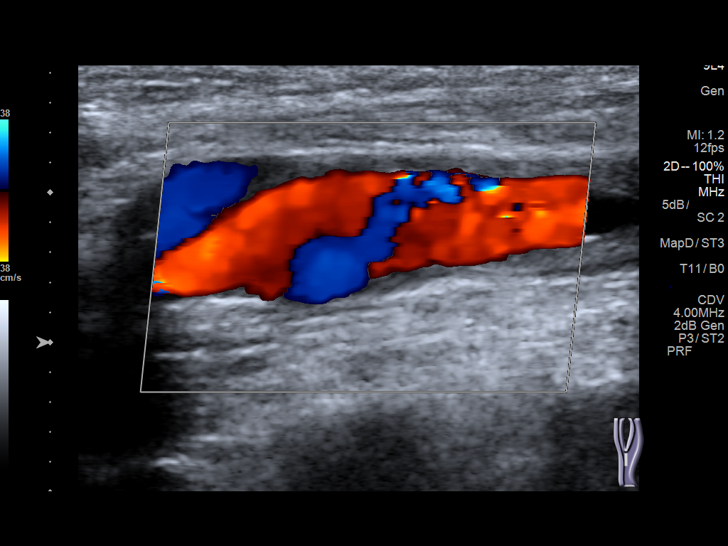
[im 51/62]
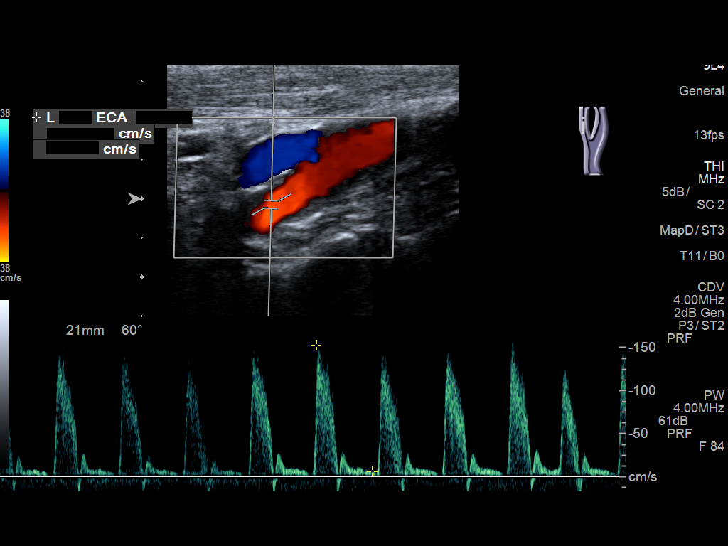
[im 56/62]
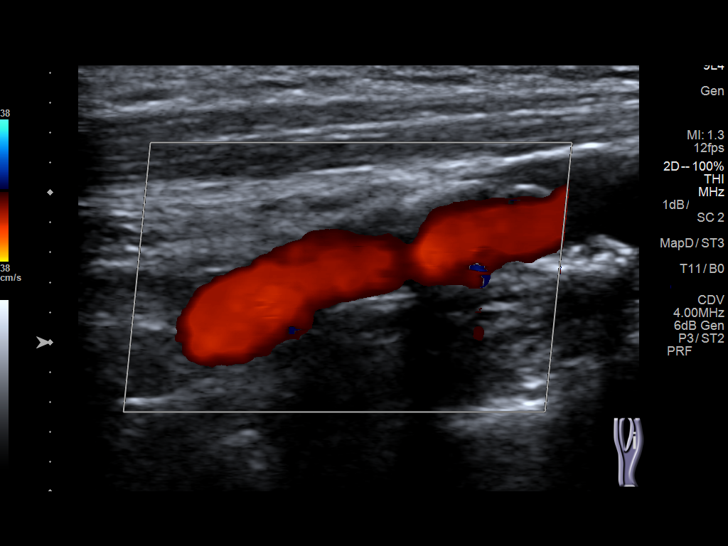
[im 62/62]
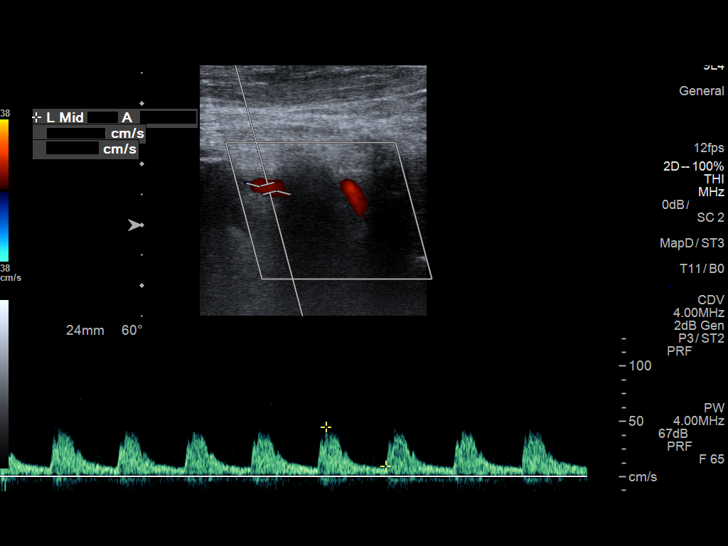

[13 of 24 positions shown; findings below may reference images not displayed]

FINDINGS: Criteria: Quantification of carotid stenosis is based on velocity
parameters that correlate the residual internal carotid diameter
with NASCET-based stenosis levels, using the diameter of the distal
internal carotid lumen as the denominator for stenosis measurement.

The following velocity measurements were obtained:

RIGHT

ICA:  Systolic 106 cm/sec, Diastolic 17 cm/sec

CCA:  168 cm/sec

SYSTOLIC ICA/CCA RATIO:

ECA:  178 cm/sec

LEFT

ICA:  Systolic 93 cm/sec, Diastolic 26 cm/sec

CCA:  150 cm/sec

SYSTOLIC ICA/CCA RATIO:

ECA:  153 cm/sec

Right Brachial SBP: Not acquired

Left Brachial SBP: Not acquired

RIGHT CAROTID ARTERY: Mild calcifications of the right common
carotid artery. Intimal thickening. Intermediate waveform
maintained. Heterogeneous and partially calcified plaque at the
right carotid bifurcation. No significant lumen shadowing. Low
resistance waveform of the right ICA. Tortuosity of the right
carotid system.

RIGHT VERTEBRAL ARTERY: Antegrade flow with low resistance waveform.

LEFT CAROTID ARTERY: Mild calcifications of the left common carotid
artery. Significant intimal thickening and plaque formation of the
common carotid artery. Intermediate waveform maintained.
Heterogeneous and partially calcified plaque at the left carotid
bifurcation without significant lumen shadowing. Low resistance
waveform of the left ICA. Tortuosity of the left carotid system.

LEFT VERTEBRAL ARTERY:  Antegrade flow with low resistance waveform.
IMPRESSION: Color duplex indicates moderate heterogeneous and calcified plaque,
with no hemodynamically significant stenosis by duplex criteria in
the extracranial cerebrovascular circulation.

Significant intimal thickening of the bilateral common carotid
artery with left greater than right plaque formation. These changes
of common carotid artery have been indicated in some data sets to be
associated with increased risk for future cardiovascular events.

## 2016-01-26 NOTE — Assessment & Plan Note (Signed)
Compression knee highs recommended and leg elevation

## 2016-01-26 NOTE — Assessment & Plan Note (Signed)
Secondary to leg weakness.  PT referral made.

## 2016-01-26 NOTE — Assessment & Plan Note (Signed)
continue vaginal estrogen and await culture results

## 2016-01-26 NOTE — Assessment & Plan Note (Addendum)
Referral to dermatology for skin biopsy

## 2016-01-26 NOTE — Assessment & Plan Note (Signed)
She requires use of a rolling walker to ambulate but has been leaning heavily on the bars , which has caused her shoulder to hurt.  PT evaluation in progress

## 2016-02-21 ENCOUNTER — Ambulatory Visit (INDEPENDENT_AMBULATORY_CARE_PROVIDER_SITE_OTHER): Payer: PPO | Admitting: Urology

## 2016-02-21 ENCOUNTER — Encounter: Payer: Self-pay | Admitting: Urology

## 2016-02-21 VITALS — BP 196/75 | HR 66 | Ht 66.0 in | Wt 130.5 lb

## 2016-02-21 DIAGNOSIS — R32 Unspecified urinary incontinence: Secondary | ICD-10-CM

## 2016-02-21 DIAGNOSIS — N952 Postmenopausal atrophic vaginitis: Secondary | ICD-10-CM

## 2016-02-21 DIAGNOSIS — N3020 Other chronic cystitis without hematuria: Secondary | ICD-10-CM

## 2016-02-21 LAB — URINALYSIS, COMPLETE
BILIRUBIN UA: NEGATIVE
GLUCOSE, UA: NEGATIVE
KETONES UA: NEGATIVE
NITRITE UA: POSITIVE — AB
PROTEIN UA: NEGATIVE
SPEC GRAV UA: 1.015 (ref 1.005–1.030)
UUROB: 0.2 mg/dL (ref 0.2–1.0)
pH, UA: 5.5 (ref 5.0–7.5)

## 2016-02-21 LAB — MICROSCOPIC EXAMINATION: Epithelial Cells (non renal): NONE SEEN /hpf (ref 0–10)

## 2016-02-21 MED ORDER — FESOTERODINE FUMARATE ER 4 MG PO TB24: 4.0000 mg | ORAL_TABLET | Freq: Every day | ORAL | 0 refills | Status: DC

## 2016-02-21 MED ORDER — CRANBERRY 250 MG PO TABS: 1.0000 | ORAL_TABLET | Freq: Two times a day (BID) | ORAL | 12 refills | Status: DC

## 2016-02-21 NOTE — Progress Notes (Signed)
02/21/2016 2:32 PM   Evelyn [name] 04/06/1920 [id number]  Referring provider: Crecencio Mc, MD [address] [place], [place] [address]  Chief Complaint  Patient presents with  . Cystitis    referred by Dr. Derrel Nip    HPI: Patient is a 82 year old Caucasian female who is referred to [place] by, Dr. Derrel Nip, for cystitis.  Patient states that she has had numerous urinary tract infections over the last year.  Her symptoms with a urinary tract infection consist of frequency and incontinence.  She denies dysuria, gross hematuria, suprapubic pain, back pain, abdominal pain or flank pain.  She has not had any recent fevers, chills, nausea or vomiting.   She does not have a history of nephrolithiasis, GU surgery or GU trauma.   Reviewing her records,  she has had one infection over the last year.  It was positive for Klebsiella oxytoca and sensitive to the nitrofurantoin she was given.  Prior to this, she had three UTI's in 2015.  All positive for E.coli.    She is post menopausal.  She endorses diarrhea.   She does not engage in good perineal hygiene. She does not take tub baths.  She does  have incontinence.  She is using incontinence pads. She is going through three pads daily.    She had a CT with contrast in 2015 which noted mild chronic stable prominence of the intrarenal collecting systems and ureters.    She is drinking one to two glassed of water daily.     PMH: Past Medical History:  Diagnosis Date  . Anemia    hx of blood transfusion  . Cancer ([place])    lymphomia  . Coarse tremors    head shakes if does not take propranolol  . Coronary artery disease   . GERD (gastroesophageal reflux disease)   . History of lymphoma   . Hyperlipidemia   . Hypertension   . Insomnia    takes remeron nightly  . Other chronic cystitis    Radiation , botox injections  . Self-catheterizes urinary bladder    twice daily  . Thyroid disease    had thyroid removed     Surgical History: Past Surgical History:  Procedure Laterality Date  . ABDOMINAL HYSTERECTOMY  1970s  . APPENDECTOMY    . BACK SURGERY     lower back - bulging disk  . CARDIAC CATHETERIZATION  2005   2 cardiac stents  . CYSTOSCOPY  April 2013  . EYE SURGERY     bilateral cataracts  . JOINT REPLACEMENT  2012   right hip  . LUMBAR LAMINECTOMY/DECOMPRESSION MICRODISCECTOMY  01/24/2012   Procedure: LUMBAR LAMINECTOMY/DECOMPRESSION MICRODISCECTOMY 2 LEVELS;  Surgeon: Floyce Stakes, MD;  Location: [facility];  Service: Neurosurgery;  Laterality: Right;  Right Lumbar four five, lumbar five sacral one foraminotomy   . [place] SURGERY  2008   [place]  . STOMACH SURGERY     removal all but small portion of stomach out due to ulcers    Home Medications:    Medication List       Accurate as of 02/21/16  2:32 PM. Always use your most recent med list.          conjugated estrogens vaginal cream Commonly known as:  PREMARIN Place 1 Applicatorful vaginally daily.   Cranberry 250 MG Tabs Take 1 tablet (250 mg total) by mouth 2 (two) times daily.   ELIQUIS 2.5 MG Tabs tablet Generic drug:  apixaban  Take 2.5 mg by mouth 2 (two) times daily.   estrogens (conjugated) 0.625 MG tablet Commonly known as:  PREMARIN Take 0.625 mg by mouth daily. Take daily for 21 days then do not take for 7 days.   fesoterodine 4 MG Tb24 tablet Commonly known as:  TOVIAZ Take 1 tablet (4 mg total) by mouth daily.   HYDROcodone-acetaminophen 10-325 MG tablet Commonly known as:  NORCO Take by mouth.   levothyroxine 100 MCG tablet Commonly known as:  SYNTHROID, LEVOTHROID Take 100 mcg by mouth daily.   loperamide 2 MG tablet Commonly known as:  IMODIUM A-D Take 4 mg by mouth 4 (four) times daily as needed for diarrhea or loose stools.   mirtazapine 7.5 MG tablet Commonly known as:  REMERON Take 1 tablet (7.5 mg total) by mouth at bedtime.   multivitamin with minerals tablet Take 1 tablet  by mouth daily.   nitrofurantoin (macrocrystal-monohydrate) 100 MG capsule Commonly known as:  MACROBID Take 100 mg by mouth 2 (two) times daily.   omeprazole 20 MG capsule Commonly known as:  PRILOSEC Take 20 mg by mouth daily.   oxybutynin 5 MG 24 hr tablet Commonly known as:  DITROPAN-XL   propranolol 20 MG tablet Commonly known as:  INDERAL Take 1.5 tablets (30 mg total) by mouth 2 (two) times daily.   vitamin B-12 1000 MCG tablet Commonly known as:  CYANOCOBALAMIN Take 1,000 mcg by mouth daily.   zolpidem 5 MG tablet Commonly known as:  AMBIEN Take 5 mg by mouth at bedtime as needed for sleep.       Allergies:  Allergies  Allergen Reactions  . Cephalosporins Other (See Comments)  . Macrolides And Ketolides Other (See Comments)  . Oxycodone Nausea Only  . Ciprofloxacin Swelling, Rash and Other (See Comments)  . Neomy-Bacit-Polymyx-Pramoxine Rash    "All antibiotics"  . Other Rash    "All antibiotics"  . Penicillins Swelling, Rash and Other (See Comments)    Pt is unable to answer additional questions about this medication.    . Prednisone Rash and Other (See Comments)  . Sulfa Antibiotics Swelling, Rash and Other (See Comments)    Family History: Family History  Problem Relation Age of Onset  . Diabetes Mother   . Heart disease Father   . Kidney disease Neg Hx   . Bladder Cancer Neg Hx     Social History:  reports that she quit smoking about 44 years ago. She has never used smokeless tobacco. She reports that she does not drink alcohol or use drugs.  ROS: UROLOGY Frequent Urination?: Yes Hard to postpone urination?: Yes Burning/pain with urination?: No Get up at night to urinate?: No Leakage of urine?: Yes Urine stream starts and stops?: No Trouble starting stream?: Yes Do you have to strain to urinate?: No Blood in urine?: No Urinary tract infection?: Yes Sexually transmitted disease?: No Injury to kidneys or bladder?: No Painful  intercourse?: No Weak stream?: No Currently pregnant?: No Vaginal bleeding?: No Last menstrual period?: n  Gastrointestinal Nausea?: No Vomiting?: No Indigestion/heartburn?: No Diarrhea?: No Constipation?: No  Constitutional Fever: No Night sweats?: No Weight loss?: No Fatigue?: No  Skin Skin rash/lesions?: No Itching?: No  Eyes Blurred vision?: No Double vision?: No  Ears/Nose/Throat Sore throat?: No Sinus problems?: No  Hematologic/Lymphatic Swollen glands?: No Easy bruising?: Yes  Cardiovascular Leg swelling?: Yes Chest pain?: No  Respiratory Cough?: Yes Shortness of breath?: Yes  Endocrine Excessive thirst?: No  Musculoskeletal Back pain?: No Joint pain?:  No  Neurological Headaches?: No Dizziness?: No  Psychologic Depression?: No Anxiety?: No  Physical Exam: BP (!) 196/75   Pulse 66   Ht 5\' 6"  (1.676 m)   Wt 130 lb 8 oz (59.2 kg)   BMI 21.06 kg/m   Constitutional: Well nourished. Alert and oriented, No acute distress. HEENT: [place] AT, moist mucus membranes. Trachea midline, no masses. Cardiovascular: No clubbing, cyanosis, or edema. Respiratory: Normal respiratory effort, no increased work of breathing. GI: Abdomen is soft, non tender, non distended, no abdominal masses. Liver and spleen not palpable.  No hernias appreciated.  Stool sample for occult testing is not indicated.   GU: No CVA tenderness.  No bladder fullness or masses.  Atrophic external genitalia, normal pubic hair distribution, no lesions.  Normal urethral meatus, no lesions, no prolapse, no discharge.   No urethral masses, tenderness and/or tenderness. No bladder fullness, tenderness or masses. Normal vagina mucosa, good estrogen effect, no discharge, no lesions, good pelvic support, no cystocele or rectocele noted.  Cervix, uterus and ovaries are surgically absent.  Anus and perineum are without rashes or lesions.   Diarrhea is noted in the gluteal folds.   Skin: No rashes,  bruises or suspicious lesions. Lymph: No cervical or inguinal adenopathy. Neurologic: Grossly intact, no focal deficits, moving all 4 extremities. Psychiatric: Normal mood and affect.  Laboratory Data: Lab Results  Component Value Date   WBC 8.5 01/04/2016   HGB 12.0 01/04/2016   HCT 36.1 01/04/2016   MCV 85.2 01/04/2016   PLT 374 01/04/2016    Lab Results  Component Value Date   CREATININE 0.96 01/25/2016     Lab Results  Component Value Date   HGBA1C 6.1 03/23/2014    Lab Results  Component Value Date   TSH 0.59 01/25/2016       Component Value Date/Time   CHOL 260 (H) 03/23/2014 1525   CHOL 282 (H) 12/26/2013 1415   HDL 43.60 03/23/2014 1525   HDL 42 12/26/2013 1415   CHOLHDL 6 03/23/2014 1525   VLDL 53.6 (H) 03/23/2014 1525   VLDL 49 (H) 12/26/2013 1415   LDLCALC 191 (H) 12/26/2013 1415    Lab Results  Component Value Date   AST 13 01/25/2016   Lab Results  Component Value Date   ALT 10 01/25/2016     Urinalysis Nitrite positive.  > 30 WBC's/hpf.  3-10 RBC's/hpf.  Large bacteria.  See EPIC.    Procedure In and Out Catheterization  Patient is present today for a I & O catheterization due to recurrent UTI's. Patient was cleaned and prepped in a sterile fashion with betadine and Lidocaine 2% jelly was instilled into the urethra.  A 14 FR cath was inserted no complications were noted , 40 ml of urine return was noted, urine was yellow, cloudy in color. A clean urine sample was collected for UA and culture. Bladder was drained  And catheter was removed with out difficulty.    Preformed by: Zara Council, PA-C   Pertinent Imaging: CLINICAL DATA:  Nausea and vomiting for 2 days.  Fall last Tuesday.   EXAM:  CT ABDOMEN AND PELVIS WITH CONTRAST   TECHNIQUE:  Multidetector CT imaging of the abdomen and pelvis was performed  using the standard protocol following bolus administration of  intravenous contrast.   CONTRAST:  100 mL Isovue-300 IV    COMPARISON:  CT 01/28/2013, 04/27/2008 and pelvis/right hip films  12/04/2013   FINDINGS:  Lung bases are within normal. There is calcification  of the mitral  valve annulus.   Abdominal images demonstrate several small liver cysts unchanged.  The spleen, pancreas and adrenal glands are within normal.  Gallbladder is unremarkable. Kidneys are normal in size and  otherwise unchanged. There is mild chronic stable prominence of the  intrarenal collecting systems and ureters. There is moderate  calcified plaque over the abdominal aorta with portable paucity of  the mid to distal aorta. Calcified plaque is present over the iliac  vessels. There is moderate diverticulosis of the colon without  active inflammation. The appendix is not seen. There is evidence of  patient's prior partial gastrectomy with gastrojejunostomy. There  are a few small nonspecific periaortic lymph nodes unchanged. There  is no free fluid. No evidence of bowel obstruction.   Pelvic images demonstrate no change in a 4.1 cm simple cystic  structure over the left posterior pelvis abutting the left lateral  wall of the rectum. Bladder is unremarkable.   There is moderate degenerative change of the spinal curvature of the  lumbar spine convex to the right. There are mild degenerative  changes of the hips. Three compression screws are present over the  right femoral neck intact and unchanged. There is no definite acute  right hip fracture as findings are unchanged from 01/28/2013.   IMPRESSION:  No acute findings in the abdomen/pelvis.   Degenerative change of the hips with postsurgical findings over the  right hip. No definite acute right hip fracture.   Multiple small stable liver cysts.   Diverticulosis of the colon.   Mild stable chronic prominence of the intrarenal collecting systems  and ureters.   4.1 cm simple cystic structure over the left pelvis stable since  2009 and likely a benign  process such as a peritoneal inclusion  cyst.   Gastrojejunostomy.    Electronically Signed    By: Marin Olp M.D.    On: 12/04/2013 13:25      Assessment & Plan:    1. Recurrent UTI:   Patient denies any dysuria at this time.  Per Dr. Lupita Dawn note, she was complaining of dysuria.  I am concerned that she is colonized and her frequency and incontinence are the result of an overactive bladder.  Patient is instructed to increase her water intake until the urine is pale yellow or clear.  I have advised her to take probiotics (yogurt, oral pills or vaginal suppositories), take cranberry pills or drink the juice and use the estrogen cream.  She is to take Vitamin C 1,000 mg daily to acidify the urine.  She should also avoid soaking in tubs and wipe front to back after urinating.    Because of her history of recurrent UTIs, I have asked the patient to contact our office if she should experience symptoms of urinary tract infection so that we can CATH her for an urine specimen for urinalysis and culture. This is to prevent a skin contaminant from showing up in the urine culture.  If she should have her symptoms after hours or cannot get to our office, she should notify her other providers that she needs a catheterized specimen for UA and culture.   I reviewed the symptoms of a urinary tract infection, such as a worsening of urinary urgency and frequency, dysuria, which is painful urination and not the pain of urine hitting sensitive perineal skin, hematuria, foul-smelling urine, suprapubic pain or mental status changes. Fevers, chills, nausea and or vomiting can also be signs of a possible UTI.  Positive urinalyses and positive urine cultures that are not associated with urinary symptoms should not be treated with antibiotics.    I explained to the patient that being exposed to unnecessary antibiotics can put her at risk for increasing resistance of the bacteria to antibiotics, C. difficile and  the side effects of the antibiotics.    I have cathed her today for UA and culture.  Will contact patient with results.                                             2. Atrophic vaginitis:   Patient will continue the vaginal estrogen cream, applying it 3 nights weekly.   3. Incontinence:   Patient is having to change her pads three times daily.  She has been on oxybutynin in the past, but it was a very low dose.  She has HTN, so she is not a candidate for Myrbetriq.  I have given her Toviaz 4 mg daily samples.  She will RTC in 3 weeks for symptom recheck and PVR.     Return in about 3 weeks (around 03/13/2016) for PVR and symptom recheck .  These notes generated with voice recognition software. I apologize for typographical errors.  Zara Council, [facility] [address], [place], [place] [address] [phone]

## 2016-02-25 LAB — CULTURE, URINE COMPREHENSIVE

## 2016-02-28 ENCOUNTER — Telehealth: Payer: Self-pay | Admitting: *Deleted

## 2016-02-28 NOTE — Telephone Encounter (Signed)
-----   Message from Nori Riis, PA-C sent at 02/28/2016 10:59 AM EDT ----- Patient is most likely colonized.  We will need treat this UTI, unless she develops fevers, suprapubic pain or gross hematuria.

## 2016-02-28 NOTE — Telephone Encounter (Signed)
LMOM for patient and/or Evelyn [name] to call office back about labs. Urine CX, patient is most likely colonized. We will not treat this uti unless she develops fevers, suprapubic pain or gross hematuria per Larene Beach.

## 2016-02-29 NOTE — Telephone Encounter (Signed)
Evelyn [name] called office back and I gave him message. He did have a lot of questions and after consulting Larene Beach I explained to him why we don't treat when it's colonized and patient states he never knows when she has a fever because she doesn't ever run a fever. I explained again maybe to keep a check on her Temperature and if any suprapubic pain or fever or gross hematuria occurs to please call office back. Patient not exactly happy with answer but stated he understood.

## 2016-03-03 DIAGNOSIS — D485 Neoplasm of uncertain behavior of skin: Secondary | ICD-10-CM | POA: Diagnosis not present

## 2016-03-03 DIAGNOSIS — C44729 Squamous cell carcinoma of skin of left lower limb, including hip: Secondary | ICD-10-CM | POA: Diagnosis not present

## 2016-03-12 NOTE — Progress Notes (Signed)
03/13/2016 10:34 AM   Evelyn [name] November 02, 1919 [id number]  Referring provider: Crecencio Mc, MD [facility], [place] [address]  Chief Complaint  Patient presents with  . Follow-up    HPI: Patient is a 82 year old Caucasian female with a history of recurrent UTI's, vaginal atrophy and incontinence who presents today for a 3 week follow up.   Background history Patient was referred to [place] by, Dr. Derrel Nip, for cystitis.  Patient states that she has had numerous urinary tract infections over the last year.  Her symptoms with a urinary tract infection consist of frequency and incontinence.  She denies dysuria, gross hematuria, suprapubic pain, back pain, abdominal pain or flank pain.  She has not had any recent fevers, chills, nausea or vomiting.  She does not have a history of nephrolithiasis, GU surgery or GU trauma.  Reviewing her records,  she has had one infection over the last year.  It was positive for Klebsiella oxytoca and sensitive to the nitrofurantoin she was given.  Prior to this, she had three UTI's in 2015.  All positive for E.coli.   She is post menopausal.  She endorses diarrhea.  She does not engage in good perineal hygiene. She does not take tub baths.  She does  have incontinence.  She is using incontinence pads. She is going through three pads daily.   She had a CT with contrast in 2015 which noted mild chronic stable prominence of the intrarenal collecting systems and ureters.  She is drinking one to two glassed of water daily.    History of recurrent UTI's Patient was educated on UTI prevention at her last visit.  I shared with the patient and her caregiver that I felt the UTI's may be a colonization of her GU tract and not true infections.  Counseled on symptoms of an UTI and instructed the patient to contact the office to have a CATH UA for culture.  Patient was positive for a highly resistant E. Coli at last visit, but she was not started on  antibiotics as she had no symptoms.    Vaginal atrophy Caregiver is uncertain about whether or not the patient is receiving the vaginal cream as prescribed.  She will check the [facility] and call us back.  Incontinence Patient was given a trial of Toviaz 4 mg daily at her last visit.  PVR 133 mL.  She feels the Toviaz 4 mg daily is helping with the leaking.  She has had a dry mouth, but she does do have constipation.  Staff has not noted any changes in her mental status.      PMH: Past Medical History:  Diagnosis Date  . Anemia    hx of blood transfusion  . Cancer ([place])    lymphomia  . Coarse tremors    head shakes if does not take propranolol  . Coronary artery disease   . GERD (gastroesophageal reflux disease)   . History of lymphoma   . Hyperlipidemia   . Hypertension   . Insomnia    takes remeron nightly  . Other chronic cystitis    Radiation , botox injections  . Self-catheterizes urinary bladder    twice daily  . Thyroid disease    had thyroid removed    Surgical History: Past Surgical History:  Procedure Laterality Date  . ABDOMINAL HYSTERECTOMY  1970s  . APPENDECTOMY    . BACK SURGERY     lower back - bulging disk  .  CARDIAC CATHETERIZATION  2005   2 cardiac stents  . CYSTOSCOPY  April 2013  . EYE SURGERY     bilateral cataracts  . JOINT REPLACEMENT  2012   right hip  . LUMBAR LAMINECTOMY/DECOMPRESSION MICRODISCECTOMY  01/24/2012   Procedure: LUMBAR LAMINECTOMY/DECOMPRESSION MICRODISCECTOMY 2 LEVELS;  Surgeon: Floyce Stakes, MD;  Location: [facility];  Service: Neurosurgery;  Laterality: Right;  Right Lumbar four five, lumbar five sacral one foraminotomy   . [place] SURGERY  2008   [place]  . STOMACH SURGERY     removal all but small portion of stomach out due to ulcers    Home Medications:    Medication List       Accurate as of 03/13/16 10:34 AM. Always use your most recent med list.          conjugated estrogens vaginal cream Commonly known as:   PREMARIN Place 1 Applicatorful vaginally daily.   Cranberry 250 MG Tabs Take 1 tablet (250 mg total) by mouth 2 (two) times daily.   ELIQUIS 2.5 MG Tabs tablet Generic drug:  apixaban Take 2.5 mg by mouth 2 (two) times daily.   estrogens (conjugated) 0.625 MG tablet Commonly known as:  PREMARIN Take 0.625 mg by mouth daily. Take daily for 21 days then do not take for 7 days.   fesoterodine 4 MG Tb24 tablet Commonly known as:  TOVIAZ Take 1 tablet (4 mg total) by mouth daily.   HYDROcodone-acetaminophen 10-325 MG tablet Commonly known as:  NORCO Take by mouth.   levothyroxine 100 MCG tablet Commonly known as:  SYNTHROID, LEVOTHROID Take 100 mcg by mouth daily.   loperamide 2 MG tablet Commonly known as:  IMODIUM A-D Take 4 mg by mouth 4 (four) times daily as needed for diarrhea or loose stools.   mirtazapine 7.5 MG tablet Commonly known as:  REMERON Take 1 tablet (7.5 mg total) by mouth at bedtime.   multivitamin with minerals tablet Take 1 tablet by mouth daily.   nitrofurantoin (macrocrystal-monohydrate) 100 MG capsule Commonly known as:  MACROBID Take 100 mg by mouth 2 (two) times daily.   omeprazole 20 MG capsule Commonly known as:  PRILOSEC Take 20 mg by mouth daily.   oxybutynin 5 MG 24 hr tablet Commonly known as:  DITROPAN-XL   propranolol 20 MG tablet Commonly known as:  INDERAL Take 1.5 tablets (30 mg total) by mouth 2 (two) times daily.   vitamin B-12 1000 MCG tablet Commonly known as:  CYANOCOBALAMIN Take 1,000 mcg by mouth daily.   zolpidem 5 MG tablet Commonly known as:  AMBIEN Take 5 mg by mouth at bedtime as needed for sleep.       Allergies:  Allergies  Allergen Reactions  . Cephalosporins Other (See Comments)  . Macrolides And Ketolides Other (See Comments)  . Oxycodone Nausea Only  . Ciprofloxacin Swelling, Rash and Other (See Comments)  . Neomy-Bacit-Polymyx-Pramoxine Rash    "All antibiotics"  . Other Rash    "All  antibiotics"  . Penicillins Swelling, Rash and Other (See Comments)    Pt is unable to answer additional questions about this medication.    . Prednisone Rash and Other (See Comments)  . Sulfa Antibiotics Swelling, Rash and Other (See Comments)    Family History: Family History  Problem Relation Age of Onset  . Diabetes Mother   . Heart disease Father   . Kidney disease Neg Hx   . Bladder Cancer Neg Hx     Social History:  reports that she quit smoking about 44 years ago. She has never used smokeless tobacco. She reports that she does not drink alcohol or use drugs.  ROS: UROLOGY Frequent Urination?: No Hard to postpone urination?: No Burning/pain with urination?: No Get up at night to urinate?: No Leakage of urine?: Yes Urine stream starts and stops?: No Trouble starting stream?: No Do you have to strain to urinate?: No Blood in urine?: No Urinary tract infection?: No Sexually transmitted disease?: No Injury to kidneys or bladder?: No Painful intercourse?: No Weak stream?: No Currently pregnant?: No Vaginal bleeding?: No Last menstrual period?: n  Gastrointestinal Nausea?: No Vomiting?: No Indigestion/heartburn?: No Diarrhea?: No Constipation?: No  Constitutional Fever: No Night sweats?: No Weight loss?: No Fatigue?: Yes  Skin Skin rash/lesions?: No Itching?: No  Eyes Blurred vision?: No Double vision?: No  Ears/Nose/Throat Sore throat?: No Sinus problems?: No  Hematologic/Lymphatic Swollen glands?: No Easy bruising?: Yes  Cardiovascular Leg swelling?: Yes Chest pain?: No  Respiratory Cough?: Yes Shortness of breath?: No  Endocrine Excessive thirst?: No  Musculoskeletal Back pain?: No Joint pain?: No  Neurological Headaches?: No Dizziness?: No  Psychologic Depression?: No Anxiety?: No  Physical Exam: BP 138/71 (BP Location: Left Arm, Patient Position: Sitting, Cuff Size: Normal)   Pulse 80   Ht 5\' 6"  (1.676 m)   Wt 132 lb  (59.9 kg)   BMI 21.31 kg/m   Constitutional: Well nourished. Alert and oriented, No acute distress. HEENT: [place] AT, moist mucus membranes. Trachea midline, no masses. Cardiovascular: No clubbing, cyanosis, or edema. Respiratory: Normal respiratory effort, no increased work of breathing. Skin: No rashes, bruises or suspicious lesions. Lymph: No cervical or inguinal adenopathy. Neurologic: Grossly intact, no focal deficits, moving all 4 extremities. Psychiatric: Normal mood and affect.  Laboratory Data: Lab Results  Component Value Date   WBC 8.5 01/04/2016   HGB 12.0 01/04/2016   HCT 36.1 01/04/2016   MCV 85.2 01/04/2016   PLT 374 01/04/2016    Lab Results  Component Value Date   CREATININE 0.96 01/25/2016     Lab Results  Component Value Date   HGBA1C 6.1 03/23/2014    Lab Results  Component Value Date   TSH 0.59 01/25/2016       Component Value Date/Time   CHOL 260 (H) 03/23/2014 1525   CHOL 282 (H) 12/26/2013 1415   HDL 43.60 03/23/2014 1525   HDL 42 12/26/2013 1415   CHOLHDL 6 03/23/2014 1525   VLDL 53.6 (H) 03/23/2014 1525   VLDL 49 (H) 12/26/2013 1415   LDLCALC 191 (H) 12/26/2013 1415    Lab Results  Component Value Date   AST 13 01/25/2016   Lab Results  Component Value Date   ALT 10 01/25/2016    Pertinent Imaging:  Results for JUSTA, ARCIERO (MRN [id number]) as of 03/13/2016 10:22  Ref. Range 03/13/2016 10:07  Scan Result Unknown 133     Assessment & Plan:    1. Recurrent UTI:   Reviewed signs and symptoms of an UTI.  Asked the patient and caregiver to have a CATH UA and culture for any suspected UTI.                                     2. Atrophic vaginitis:   Patient will continue the vaginal estrogen cream, applying it 3 nights weekly.   RTC in 3 months for exam.    3.  Incontinence:   Patient feels that her Lisbeth Ply is helping.  Script is given.  She will continue the medication and RTC in 3 months for PVR and exam.     Return in  about 3 months (around 06/13/2016) for PVR and exam.  These notes generated with voice recognition software. I apologize for typographical errors.  Evelyn [name], [facility] [address], [place], [place] [address] [phone]

## 2016-03-13 ENCOUNTER — Encounter: Payer: Self-pay | Admitting: Urology

## 2016-03-13 ENCOUNTER — Telehealth: Payer: Self-pay | Admitting: Urology

## 2016-03-13 ENCOUNTER — Ambulatory Visit (INDEPENDENT_AMBULATORY_CARE_PROVIDER_SITE_OTHER): Payer: PPO | Admitting: Urology

## 2016-03-13 VITALS — BP 138/71 | HR 80 | Ht 66.0 in | Wt 132.0 lb

## 2016-03-13 DIAGNOSIS — N952 Postmenopausal atrophic vaginitis: Secondary | ICD-10-CM | POA: Diagnosis not present

## 2016-03-13 DIAGNOSIS — N390 Urinary tract infection, site not specified: Secondary | ICD-10-CM

## 2016-03-13 DIAGNOSIS — R32 Unspecified urinary incontinence: Secondary | ICD-10-CM | POA: Diagnosis not present

## 2016-03-13 LAB — BLADDER SCAN AMB NON-IMAGING: SCAN RESULT: 133

## 2016-03-13 MED ORDER — FESOTERODINE FUMARATE ER 4 MG PO TB24: 4.0000 mg | ORAL_TABLET | Freq: Every day | ORAL | 4 refills | Status: DC

## 2016-03-13 NOTE — Telephone Encounter (Signed)
Left message with Tammy at [facility] for Aurora Mask to return my call, she didn't answer her page.

## 2016-03-13 NOTE — Telephone Encounter (Signed)
Evelyn [name] from [facility] called the office.  Patient was seen in the office today and clarification was needed for her medicaitons:  Per Santiago Glad, patient is not taking the oxybutinin, but is taking the medication that we prescribed.    She is using the estrogen cream 3 times daily.

## 2016-03-14 NOTE — Telephone Encounter (Signed)
Aurora Mask called office back and stated this message has been handled.

## 2016-03-22 DIAGNOSIS — C44729 Squamous cell carcinoma of skin of left lower limb, including hip: Secondary | ICD-10-CM | POA: Diagnosis not present

## 2016-03-22 DIAGNOSIS — L905 Scar conditions and fibrosis of skin: Secondary | ICD-10-CM | POA: Diagnosis not present

## 2016-04-12 ENCOUNTER — Telehealth: Payer: Self-pay | Admitting: *Deleted

## 2016-04-12 NOTE — Telephone Encounter (Signed)
Nanine Means has requested a PT referral update, this referral was to be signed and returned, or a new referral can be placed.  Fax [phone]

## 2016-04-12 NOTE — Telephone Encounter (Signed)
Form in PCP's Quick Sign.

## 2016-04-14 ENCOUNTER — Encounter: Payer: Self-pay | Admitting: Internal Medicine

## 2016-04-28 ENCOUNTER — Encounter: Payer: PPO | Admitting: Internal Medicine

## 2016-05-05 ENCOUNTER — Inpatient Hospital Stay
Admission: EM | Admit: 2016-05-05 | Discharge: 2016-05-09 | DRG: 872 | Disposition: A | Discharge status: Home Health | Payer: PPO | Attending: Internal Medicine | Admitting: Internal Medicine

## 2016-05-05 ENCOUNTER — Telehealth: Payer: Self-pay | Admitting: *Deleted

## 2016-05-05 ENCOUNTER — Encounter: Payer: Self-pay | Admitting: Emergency Medicine

## 2016-05-05 DIAGNOSIS — Z880 Allergy status to penicillin: Secondary | ICD-10-CM | POA: Diagnosis not present

## 2016-05-05 DIAGNOSIS — Z955 Presence of coronary angioplasty implant and graft: Secondary | ICD-10-CM

## 2016-05-05 DIAGNOSIS — I480 Paroxysmal atrial fibrillation: Secondary | ICD-10-CM | POA: Diagnosis not present

## 2016-05-05 DIAGNOSIS — N3020 Other chronic cystitis without hematuria: Secondary | ICD-10-CM | POA: Diagnosis not present

## 2016-05-05 DIAGNOSIS — Z885 Allergy status to narcotic agent status: Secondary | ICD-10-CM | POA: Diagnosis not present

## 2016-05-05 DIAGNOSIS — R251 Tremor, unspecified: Secondary | ICD-10-CM | POA: Diagnosis not present

## 2016-05-05 DIAGNOSIS — I2510 Atherosclerotic heart disease of native coronary artery without angina pectoris: Secondary | ICD-10-CM | POA: Diagnosis present

## 2016-05-05 DIAGNOSIS — Z882 Allergy status to sulfonamides status: Secondary | ICD-10-CM | POA: Diagnosis not present

## 2016-05-05 DIAGNOSIS — E785 Hyperlipidemia, unspecified: Secondary | ICD-10-CM | POA: Diagnosis not present

## 2016-05-05 DIAGNOSIS — I10 Essential (primary) hypertension: Secondary | ICD-10-CM | POA: Diagnosis not present

## 2016-05-05 DIAGNOSIS — M6281 Muscle weakness (generalized): Secondary | ICD-10-CM

## 2016-05-05 DIAGNOSIS — D510 Vitamin B12 deficiency anemia due to intrinsic factor deficiency: Secondary | ICD-10-CM | POA: Diagnosis present

## 2016-05-05 DIAGNOSIS — Z87891 Personal history of nicotine dependence: Secondary | ICD-10-CM | POA: Diagnosis not present

## 2016-05-05 DIAGNOSIS — N390 Urinary tract infection, site not specified: Secondary | ICD-10-CM | POA: Diagnosis present

## 2016-05-05 DIAGNOSIS — Z79899 Other long term (current) drug therapy: Secondary | ICD-10-CM

## 2016-05-05 DIAGNOSIS — Z7901 Long term (current) use of anticoagulants: Secondary | ICD-10-CM | POA: Diagnosis not present

## 2016-05-05 DIAGNOSIS — D509 Iron deficiency anemia, unspecified: Secondary | ICD-10-CM | POA: Diagnosis not present

## 2016-05-05 DIAGNOSIS — Z888 Allergy status to other drugs, medicaments and biological substances status: Secondary | ICD-10-CM

## 2016-05-05 DIAGNOSIS — I5032 Chronic diastolic (congestive) heart failure: Secondary | ICD-10-CM | POA: Diagnosis not present

## 2016-05-05 DIAGNOSIS — Z8572 Personal history of non-Hodgkin lymphomas: Secondary | ICD-10-CM | POA: Diagnosis not present

## 2016-05-05 DIAGNOSIS — E039 Hypothyroidism, unspecified: Secondary | ICD-10-CM | POA: Diagnosis not present

## 2016-05-05 DIAGNOSIS — Z881 Allergy status to other antibiotic agents status: Secondary | ICD-10-CM | POA: Diagnosis not present

## 2016-05-05 DIAGNOSIS — Z96641 Presence of right artificial hip joint: Secondary | ICD-10-CM | POA: Diagnosis not present

## 2016-05-05 DIAGNOSIS — G4700 Insomnia, unspecified: Secondary | ICD-10-CM | POA: Diagnosis present

## 2016-05-05 DIAGNOSIS — K219 Gastro-esophageal reflux disease without esophagitis: Secondary | ICD-10-CM | POA: Diagnosis not present

## 2016-05-05 DIAGNOSIS — R262 Difficulty in walking, not elsewhere classified: Secondary | ICD-10-CM

## 2016-05-05 DIAGNOSIS — A419 Sepsis, unspecified organism: Principal | ICD-10-CM | POA: Diagnosis present

## 2016-05-05 DIAGNOSIS — I110 Hypertensive heart disease with heart failure: Secondary | ICD-10-CM | POA: Diagnosis not present

## 2016-05-05 LAB — LACTIC ACID, PLASMA: LACTIC ACID, VENOUS: 1.8 mmol/L (ref 0.5–1.9)

## 2016-05-05 LAB — COMPREHENSIVE METABOLIC PANEL
ALBUMIN: 3.8 g/dL (ref 3.5–5.0)
ALT: 9 U/L — ABNORMAL LOW (ref 14–54)
ANION GAP: 8 (ref 5–15)
AST: 17 U/L (ref 15–41)
Alkaline Phosphatase: 70 U/L (ref 38–126)
BILIRUBIN TOTAL: 0.4 mg/dL (ref 0.3–1.2)
BUN: 16 mg/dL (ref 6–20)
CHLORIDE: 103 mmol/L (ref 101–111)
CO2: 25 mmol/L (ref 22–32)
Calcium: 8.9 mg/dL (ref 8.9–10.3)
Creatinine, Ser: 1.21 mg/dL — ABNORMAL HIGH (ref 0.44–1.00)
GFR calc Af Amer: 43 mL/min — ABNORMAL LOW (ref >60)
GFR calc non Af Amer: 37 mL/min — ABNORMAL LOW (ref >60)
GLUCOSE: 142 mg/dL — AB (ref 65–99)
POTASSIUM: 4.4 mmol/L (ref 3.5–5.1)
Sodium: 136 mmol/L (ref 135–145)
TOTAL PROTEIN: 7.3 g/dL (ref 6.5–8.1)

## 2016-05-05 LAB — URINALYSIS COMPLETE WITH MICROSCOPIC (ARMC ONLY)
Bilirubin Urine: NEGATIVE
Glucose, UA: NEGATIVE mg/dL
KETONES UR: NEGATIVE mg/dL
NITRITE: NEGATIVE
PH: 5 (ref 5.0–8.0)
PROTEIN: 100 mg/dL — AB
SPECIFIC GRAVITY, URINE: 1.011 (ref 1.005–1.030)

## 2016-05-05 LAB — CBC
HEMATOCRIT: 39.3 % (ref 35.0–47.0)
Hemoglobin: 12.8 g/dL (ref 12.0–16.0)
MCH: 28.4 pg (ref 26.0–34.0)
MCHC: 32.7 g/dL (ref 32.0–36.0)
MCV: 86.8 fL (ref 80.0–100.0)
PLATELETS: 382 10*3/uL (ref 150–440)
RBC: 4.52 MIL/uL (ref 3.80–5.20)
RDW: 16.1 % — AB (ref 11.5–14.5)
WBC: 12.3 10*3/uL — ABNORMAL HIGH (ref 3.6–11.0)

## 2016-05-05 MED ORDER — DILTIAZEM HCL 25 MG/5ML IV SOLN
10.0000 mg | Freq: Once | INTRAVENOUS | Status: AC
  Administered 2016-05-05: 10 mg via INTRAVENOUS
  Filled 2016-05-05: qty 5

## 2016-05-05 MED ORDER — SODIUM CHLORIDE 0.9 % IV BOLUS (SEPSIS)
500.0000 mL | Freq: Once | INTRAVENOUS | Status: AC
  Administered 2016-05-05: 500 mL via INTRAVENOUS

## 2016-05-05 MED ORDER — MEROPENEM 1 G IV SOLR
1.0000 g | Freq: Three times a day (TID) | INTRAVENOUS | Status: DC
Start: 2016-05-05 — End: 2016-05-05
  Administered 2016-05-05: 1 g via INTRAVENOUS
  Filled 2016-05-05 (×3): qty 1

## 2016-05-05 MED ORDER — SODIUM CHLORIDE 0.9 % IV SOLN
1.0000 g | Freq: Two times a day (BID) | INTRAVENOUS | Status: DC
  Administered 2016-05-06 – 2016-05-09 (×7): 1 g via INTRAVENOUS
  Filled 2016-05-05 (×8): qty 1

## 2016-05-05 NOTE — Telephone Encounter (Signed)
Letter faxed via EPIC to Santiago Glad

## 2016-05-05 NOTE — Telephone Encounter (Signed)
ua with micro and urine culture requested . Call to give OK?

## 2016-05-05 NOTE — Progress Notes (Signed)
Pharmacy Antibiotic Note  Evelyn [name] is a 82 y.o. female admitted on 05/05/2016 with sepsis.  Pharmacy has been consulted for meropenem dosing.  Plan: Meropenem 1 gram q 12 hours ordered.  Height: 5\' 6"  (167.6 cm) Weight: 140 lb (63.5 kg) IBW/kg (Calculated) : 59.3  Temp (24hrs), Avg:97.7 F (36.5 C), Min:97.7 F (36.5 C), Max:97.7 F (36.5 C)   Recent Labs Lab 05/05/16 1820 05/05/16 2238  WBC 12.3*  --   CREATININE 1.21*  --   LATICACIDVEN  --  1.8    Estimated Creatinine Clearance: 27.2 mL/min (by C-G formula based on SCr of 1.21 mg/dL (H)).    Allergies  Allergen Reactions  . Cephalosporins Other (See Comments)  . Macrolides And Ketolides Other (See Comments)  . Oxycodone Nausea Only  . Ciprofloxacin Swelling, Rash and Other (See Comments)  . Neomy-Bacit-Polymyx-Pramoxine Rash    "All antibiotics"  . Other Rash    "All antibiotics"  . Penicillins Swelling, Rash and Other (See Comments)    Pt is unable to answer additional questions about this medication.    . Prednisone Rash and Other (See Comments)  . Sulfa Antibiotics Swelling, Rash and Other (See Comments)    Antimicrobials this admission: meropenem  >>    >>   Dose adjustments this admission:   Microbiology results: 10/6 BCx: pending 10/6 UCx: pending     10/6 UA: LE(+) NO2(-) WBC TNTC  Thank you for allowing pharmacy to be a part of this patient's care.  Tajah Noguchi S 05/05/2016 11:33 PM

## 2016-05-05 NOTE — ED Provider Notes (Signed)
Time Seen: Approximately 2212  I have reviewed the triage notes  Chief Complaint: Cystitis   History of Present Illness: Evelyn [name] is a 82 y.o. female who presents here with her son for new onset of some delirium and confusion along with a large amount of bladder pain. [facility] and apparently had seen a urologist several weeks ago and was told that there was some infectious process. Patient is still able to urinate and bedside bladder scan shows just slightly over 200 mL of retained urine. The patient's difficult of hearing but denies any pain other than the suprapubic region. She denies any chest pain, shortness of breath or cough. He was noted per EKG that she has atrial fibrillation and she is not aware of any feelings of heart palpitation or lightheadedness. Mostly pain is her main concern especially when she tries to urinate.   Past Medical History:  Diagnosis Date  . Anemia    hx of blood transfusion  . Cancer ([place])    lymphomia  . Coarse tremors    head shakes if does not take propranolol  . Coronary artery disease   . GERD (gastroesophageal reflux disease)   . History of lymphoma   . Hyperlipidemia   . Hypertension   . Insomnia    takes remeron nightly  . Other chronic cystitis    Radiation , botox injections  . Self-catheterizes urinary bladder    twice daily  . Thyroid disease    had thyroid removed    Patient Active Problem List   Diagnosis Date Noted  . Edema extremities 01/26/2016  . Skin benign neoplasm 01/26/2016  . Unsteadiness 07/14/2015  . Anorexia 06/22/2015  . Loss of weight 06/22/2015  . Complicated UTI (urinary tract infection) 05/07/2015  . Syncope 05/05/2015  . Chronic diastolic heart failure ([place]) 03/04/2015  . Acute on chronic diastolic heart failure ([place]) 06/04/2014  . Paroxysmal atrial fibrillation ([place]) 05/29/2014  . Candidiasis of perineum 05/27/2014  . Tachycardia 05/27/2014  . Diarrhea 12/18/2013  .  Rectal pain 12/14/2013  . ESBL (extended spectrum beta-lactamase) producing bacteria infection 12/09/2013  . Unspecified vitamin D deficiency 11/25/2013  . Chronic insomnia 11/23/2013  . History of recent fall 11/23/2013  . Bradycardia 04/10/2013  . Generalized muscle weakness 04/10/2013  . Anemia, iron deficiency 12/27/2012  . Dysphagia, pharyngoesophageal phase 07/06/2012  . Anemia, pernicious 10/15/2011  . Spinal stenosis of lumbar region 10/15/2011  . Hypertension   . Hyperlipidemia   . Hypothyroidism   . Cystitis, chronic   . History of lymphoma   . Urinary tract infection 07/14/2011    Past Surgical History:  Procedure Laterality Date  . ABDOMINAL HYSTERECTOMY  1970s  . APPENDECTOMY    . BACK SURGERY     lower back - bulging disk  . CARDIAC CATHETERIZATION  2005   2 cardiac stents  . CYSTOSCOPY  April 2013  . EYE SURGERY     bilateral cataracts  . JOINT REPLACEMENT  2012   right hip  . LUMBAR LAMINECTOMY/DECOMPRESSION MICRODISCECTOMY  01/24/2012   Procedure: LUMBAR LAMINECTOMY/DECOMPRESSION MICRODISCECTOMY 2 LEVELS;  Surgeon: Floyce Stakes, MD;  Location: [facility];  Service: Neurosurgery;  Laterality: Right;  Right Lumbar four five, lumbar five sacral one foraminotomy   . [place] SURGERY  2008   [place]  . STOMACH SURGERY     removal all but small portion of stomach out due to ulcers    Past Surgical History:  Procedure Laterality  Date  . ABDOMINAL HYSTERECTOMY  1970s  . APPENDECTOMY    . BACK SURGERY     lower back - bulging disk  . CARDIAC CATHETERIZATION  2005   2 cardiac stents  . CYSTOSCOPY  April 2013  . EYE SURGERY     bilateral cataracts  . JOINT REPLACEMENT  2012   right hip  . LUMBAR LAMINECTOMY/DECOMPRESSION MICRODISCECTOMY  01/24/2012   Procedure: LUMBAR LAMINECTOMY/DECOMPRESSION MICRODISCECTOMY 2 LEVELS;  Surgeon: Floyce Stakes, MD;  Location: [facility];  Service: Neurosurgery;  Laterality: Right;  Right Lumbar four five, lumbar five  sacral one foraminotomy   . [place] SURGERY  2008   [place]  . STOMACH SURGERY     removal all but small portion of stomach out due to ulcers    Current Outpatient Rx  . Order #: [id number] Class: Historical Med  . Order #: [id number] Class: Historical Med  . Order #: [id number] Class: Print  . Order #: [id number] Class: Historical Med  . Order #: [id number] Class: Normal  . Order #: [id number] Class: Historical Med  . Order #: [id number] Class: Historical Med  . Order #: [id number] Class: Historical Med  . Order #: [id number] Class: Print  . Order #: [id number] Class: Historical Med  . Order #: [id number] Class: Historical Med  . Order #: [id number] Class: Historical Med  . Order #: [id number] Class: Historical Med  . Order #: [id number] Class: Print  . Order #: [id number] Class: Historical Med  . Order #: [id number] Class: Historical Med    Allergies:  Cephalosporins; Macrolides and ketolides; Oxycodone; Ciprofloxacin; Neomy-bacit-polymyx-pramoxine; Other; Penicillins; Prednisone; and Sulfa antibiotics  Family History: Family History  Problem Relation Age of Onset  . Diabetes Mother   . Heart disease Father   . Kidney disease Neg Hx   . Bladder Cancer Neg Hx     Social History: Social History  Substance Use Topics  . Smoking status: Former Smoker    Quit date: 04/04/1971  . Smokeless tobacco: Never Used  . Alcohol use No     Review of Systems:   10 point review of systems was performed and was otherwise negative:  Constitutional: No fever Eyes: No visual disturbances ENT: No sore throat, ear pain Cardiac: No chest pain Respiratory: No shortness of breath, wheezing, or stridor Abdomen: Pain in the abdomen is critically suprapubic in the region where she points. She's had some nausea with no vomiting. No loose stool or diarrhea. Endocrine: No weight loss, No night sweats Extremities: No peripheral edema, cyanosis Skin: No rashes, easy bruising Neurologic: No focal weakness, trouble with  speech or swollowing Urologic: No dysuria, Hematuria, or urinary frequency Her son states that she's been somewhat confused recently. No hallucinations  Physical Exam:  ED Triage Vitals  Enc Vitals Group     BP 05/05/16 1814 128/76     Pulse Rate 05/05/16 1814 94     Resp 05/05/16 1814 20     Temp 05/05/16 1814 97.7 F (36.5 C)     Temp Source 05/05/16 1814 Oral     SpO2 05/05/16 1814 94 %     Weight 05/05/16 1815 140 lb (63.5 kg)     Height 05/05/16 1815 5\' 6"  (1.676 m)     Head Circumference --      Peak Flow --      Pain Score --      Pain Loc --      Pain Edu? --      Excl. in [place]? --     General: Awake , Alert , and Oriented times  3; GCS 15 Head: Normal cephalic , atraumatic Eyes: Pupils equal , round, reactive to light Nose/Throat: No nasal drainage, patent upper airway without erythema or exudate.  Neck: Supple, Full range of motion, No anterior adenopathy or palpable thyroid masses Lungs: Clear to ascultation without wheezes , rhonchi, or rales Heart:Irregular rate, irregular rhythm without murmurs , gallops , or rubs Abdomen: Soft, non tender without rebound, guarding , or rigidity; bowel sounds positive and symmetric in all 4 quadrants. No organomegaly .        Extremities: 2 plus symmetric pulses. No edema, clubbing or cyanosis Neurologic: normal ambulation, Motor symmetric without deficits, sensory intact Skin: warm, dry, no rashes   Labs:   All laboratory work was reviewed including any pertinent negatives or positives listed below:  Labs Reviewed  [place]) - Abnormal; Notable for the following:       Result Value   Color, Urine YELLOW (*)    APPearance CLOUDY (*)    Hgb urine dipstick 1+ (*)    Protein, ur 100 (*)    Leukocytes, UA 2+ (*)    Bacteria, UA MANY (*)    Squamous Epithelial / LPF 0-5 (*)    All other components within normal limits  CBC - Abnormal; Notable for the following:    WBC 12.3 (*)    RDW  16.1 (*)    All other components within normal limits  COMPREHENSIVE METABOLIC PANEL - Abnormal; Notable for the following:    Glucose, Bld 142 (*)    Creatinine, Ser 1.21 (*)    ALT 9 (*)    GFR calc non Af Amer 37 (*)    GFR calc Af Amer 43 (*)    All other components within normal limits  URINE CULTURE  CULTURE, BLOOD (ROUTINE X 2)  CULTURE, BLOOD (ROUTINE X 2)  LACTIC ACID, PLASMA  LACTIC ACID, PLASMA  Patient has appearance of significant urinary tract infection. Urine culture is pending. Patient was placed in the septic protocol plug cultures 2 pending also  EKG: * ED ECG REPORT I, Daymon Larsen, the attending physician, personally viewed and interpreted this ECG.  Date: 05/05/2016 EKG Time: 2228 Rate: 113 Rhythm: Atrial fibrillation QRS Axis: normal Intervals: normal ST/T Wave abnormalities: normal Conduction Disturbances: none Narrative Interpretation: unremarkable No acute ischemic changes   Radiology: "No results found."     Critical Care:  Patient was started on septic workup CRITICAL CARE Performed by: Daymon Larsen   Total critical care time: 37 minutes  Critical care time was exclusive of separately billable procedures and treating other patients.  Critical care was necessary to treat or prevent imminent or life-threatening deterioration.  Critical care was time spent personally by me on the following activities: development of treatment plan with patient and/or surrogate as well as nursing, discussions with consultants, evaluation of patient's response to treatment, examination of patient, obtaining history from patient or surrogate, ordering and performing treatments and interventions, ordering and review of laboratory studies, ordering and review of radiographic studies, pulse oximetry and re-evaluation of patient's condition. Critical care for initial evaluation and treatment for possible urosepsis along with atrial fibrillation with rapid  ventricular rate. Patient received IV Cardizem for rate control    ED Course:  Patient's stay here was uneventful she was started on IV antibiotic therapy after pan cultures were obtained. I felt she may be septic secondary to an elevated white blood cell count. She is currently afebrile. Patient also has atrial  fibrillation which seems to be paroxysmal in nature and her last EKG here was a normal sinus rhythm. Patient was given a bolus of IV Cardizem for rate control while she was given IV fluids for possible sepsis. Patient is not hypotensive and I felt did not require septic fluid resuscitation. Clinical Course     Assessment: * Urosepsis Atrial fibrillation     Plan:  Inpatient            Daymon Larsen, MD 05/05/16 2330

## 2016-05-05 NOTE — Telephone Encounter (Signed)
We will have to fax order to [facility]. Please advise.

## 2016-05-05 NOTE — Telephone Encounter (Signed)
[facility] has requested a order for culture and sensitivity and UA, due to pt complainf of bladder pain and confusion. Contact Santiago Glad [phone] Fax [phone]

## 2016-05-05 NOTE — ED Triage Notes (Signed)
Pt presents to ED with son c/o new confusion and extreme bladder pain. Pt lives at [facility] and son was called to come take her to ED. Pt went to the urologist six weeks ago with UTI that was "colonized" and they did not treat it.

## 2016-05-05 NOTE — Telephone Encounter (Signed)
Please advise 

## 2016-05-05 NOTE — ED Notes (Signed)
Pt denies needs at this time. Call bell at side.

## 2016-05-06 ENCOUNTER — Encounter: Payer: Self-pay | Admitting: Emergency Medicine

## 2016-05-06 DIAGNOSIS — Z882 Allergy status to sulfonamides status: Secondary | ICD-10-CM | POA: Diagnosis not present

## 2016-05-06 DIAGNOSIS — I10 Essential (primary) hypertension: Secondary | ICD-10-CM | POA: Diagnosis not present

## 2016-05-06 DIAGNOSIS — Z7901 Long term (current) use of anticoagulants: Secondary | ICD-10-CM | POA: Diagnosis not present

## 2016-05-06 DIAGNOSIS — N3020 Other chronic cystitis without hematuria: Secondary | ICD-10-CM | POA: Diagnosis present

## 2016-05-06 DIAGNOSIS — I2510 Atherosclerotic heart disease of native coronary artery without angina pectoris: Secondary | ICD-10-CM | POA: Diagnosis present

## 2016-05-06 DIAGNOSIS — Z96641 Presence of right artificial hip joint: Secondary | ICD-10-CM | POA: Diagnosis present

## 2016-05-06 DIAGNOSIS — N390 Urinary tract infection, site not specified: Secondary | ICD-10-CM | POA: Diagnosis not present

## 2016-05-06 DIAGNOSIS — K219 Gastro-esophageal reflux disease without esophagitis: Secondary | ICD-10-CM | POA: Diagnosis present

## 2016-05-06 DIAGNOSIS — D510 Vitamin B12 deficiency anemia due to intrinsic factor deficiency: Secondary | ICD-10-CM | POA: Diagnosis present

## 2016-05-06 DIAGNOSIS — I5032 Chronic diastolic (congestive) heart failure: Secondary | ICD-10-CM | POA: Diagnosis present

## 2016-05-06 DIAGNOSIS — E039 Hypothyroidism, unspecified: Secondary | ICD-10-CM | POA: Diagnosis present

## 2016-05-06 DIAGNOSIS — Z880 Allergy status to penicillin: Secondary | ICD-10-CM | POA: Diagnosis not present

## 2016-05-06 DIAGNOSIS — I110 Hypertensive heart disease with heart failure: Secondary | ICD-10-CM | POA: Diagnosis present

## 2016-05-06 DIAGNOSIS — Z87891 Personal history of nicotine dependence: Secondary | ICD-10-CM | POA: Diagnosis not present

## 2016-05-06 DIAGNOSIS — I480 Paroxysmal atrial fibrillation: Secondary | ICD-10-CM | POA: Diagnosis not present

## 2016-05-06 DIAGNOSIS — A419 Sepsis, unspecified organism: Secondary | ICD-10-CM | POA: Diagnosis not present

## 2016-05-06 DIAGNOSIS — D509 Iron deficiency anemia, unspecified: Secondary | ICD-10-CM | POA: Diagnosis present

## 2016-05-06 DIAGNOSIS — Z885 Allergy status to narcotic agent status: Secondary | ICD-10-CM | POA: Diagnosis not present

## 2016-05-06 DIAGNOSIS — R251 Tremor, unspecified: Secondary | ICD-10-CM | POA: Diagnosis present

## 2016-05-06 DIAGNOSIS — Z8572 Personal history of non-Hodgkin lymphomas: Secondary | ICD-10-CM | POA: Diagnosis not present

## 2016-05-06 DIAGNOSIS — Z881 Allergy status to other antibiotic agents status: Secondary | ICD-10-CM | POA: Diagnosis not present

## 2016-05-06 DIAGNOSIS — G4700 Insomnia, unspecified: Secondary | ICD-10-CM | POA: Diagnosis present

## 2016-05-06 DIAGNOSIS — Z888 Allergy status to other drugs, medicaments and biological substances status: Secondary | ICD-10-CM | POA: Diagnosis not present

## 2016-05-06 DIAGNOSIS — Z79899 Other long term (current) drug therapy: Secondary | ICD-10-CM | POA: Diagnosis not present

## 2016-05-06 DIAGNOSIS — E785 Hyperlipidemia, unspecified: Secondary | ICD-10-CM | POA: Diagnosis present

## 2016-05-06 DIAGNOSIS — Z955 Presence of coronary angioplasty implant and graft: Secondary | ICD-10-CM | POA: Diagnosis not present

## 2016-05-06 LAB — BASIC METABOLIC PANEL
Anion gap: 6 (ref 5–15)
BUN: 16 mg/dL (ref 6–20)
CO2: 26 mmol/L (ref 22–32)
CREATININE: 1.06 mg/dL — AB (ref 0.44–1.00)
Calcium: 8.2 mg/dL — ABNORMAL LOW (ref 8.9–10.3)
Chloride: 107 mmol/L (ref 101–111)
GFR calc Af Amer: 51 mL/min — ABNORMAL LOW (ref >60)
GFR, EST NON AFRICAN AMERICAN: 44 mL/min — AB (ref >60)
Glucose, Bld: 122 mg/dL — ABNORMAL HIGH (ref 65–99)
Potassium: 3.8 mmol/L (ref 3.5–5.1)
SODIUM: 139 mmol/L (ref 135–145)

## 2016-05-06 LAB — MRSA PCR SCREENING: MRSA by PCR: NEGATIVE

## 2016-05-06 LAB — CBC
HCT: 34.1 % — ABNORMAL LOW (ref 35.0–47.0)
Hemoglobin: 11.5 g/dL — ABNORMAL LOW (ref 12.0–16.0)
MCH: 29.1 pg (ref 26.0–34.0)
MCHC: 33.8 g/dL (ref 32.0–36.0)
MCV: 86.1 fL (ref 80.0–100.0)
PLATELETS: 328 10*3/uL (ref 150–440)
RBC: 3.96 MIL/uL (ref 3.80–5.20)
RDW: 15.8 % — AB (ref 11.5–14.5)
WBC: 12.1 10*3/uL — AB (ref 3.6–11.0)

## 2016-05-06 MED ORDER — APIXABAN 2.5 MG PO TABS
2.5000 mg | ORAL_TABLET | Freq: Two times a day (BID) | ORAL | Status: DC
  Administered 2016-05-06 – 2016-05-09 (×7): 2.5 mg via ORAL
  Filled 2016-05-06 (×7): qty 1

## 2016-05-06 MED ORDER — SODIUM CHLORIDE 0.9 % IV SOLN
INTRAVENOUS | Status: DC
  Administered 2016-05-06: 12:00:00 via INTRAVENOUS

## 2016-05-06 MED ORDER — ONDANSETRON HCL 4 MG/2ML IJ SOLN: 4.0000 mg | Freq: Four times a day (QID) | INTRAMUSCULAR | Status: DC | PRN

## 2016-05-06 MED ORDER — SODIUM CHLORIDE 0.9% FLUSH
3.0000 mL | Freq: Two times a day (BID) | INTRAVENOUS | Status: DC
  Administered 2016-05-06 – 2016-05-09 (×5): 3 mL via INTRAVENOUS

## 2016-05-06 MED ORDER — ONDANSETRON HCL 4 MG PO TABS: 4.0000 mg | ORAL_TABLET | Freq: Four times a day (QID) | ORAL | Status: DC | PRN

## 2016-05-06 MED ORDER — DILTIAZEM HCL 25 MG/5ML IV SOLN
15.0000 mg | Freq: Once | INTRAVENOUS | Status: AC
  Administered 2016-05-06: 15 mg via INTRAVENOUS
  Filled 2016-05-06: qty 5

## 2016-05-06 MED ORDER — ACETAMINOPHEN 650 MG RE SUPP: 650.0000 mg | Freq: Four times a day (QID) | RECTAL | Status: DC | PRN

## 2016-05-06 MED ORDER — LEVOTHYROXINE SODIUM 100 MCG PO TABS
100.0000 ug | ORAL_TABLET | Freq: Every day | ORAL | Status: DC
  Administered 2016-05-06 – 2016-05-09 (×4): 100 ug via ORAL
  Filled 2016-05-06 (×3): qty 1

## 2016-05-06 MED ORDER — ACETAMINOPHEN 325 MG PO TABS
650.0000 mg | ORAL_TABLET | Freq: Four times a day (QID) | ORAL | Status: DC | PRN
Start: 2016-05-06 — End: 2016-05-09
  Administered 2016-05-06: 650 mg via ORAL
  Filled 2016-05-06: qty 2

## 2016-05-06 MED ORDER — PROPRANOLOL HCL 20 MG PO TABS
30.0000 mg | ORAL_TABLET | Freq: Two times a day (BID) | ORAL | Status: DC
  Administered 2016-05-06 – 2016-05-09 (×7): 30 mg via ORAL
  Filled 2016-05-06 (×9): qty 1

## 2016-05-06 MED ORDER — MIRTAZAPINE 15 MG PO TABS
7.5000 mg | ORAL_TABLET | Freq: Every day | ORAL | Status: DC
  Administered 2016-05-07: 15 mg via ORAL
  Administered 2016-05-08: 7.5 mg via ORAL
  Filled 2016-05-06 (×2): qty 1

## 2016-05-06 MED ORDER — PANTOPRAZOLE SODIUM 40 MG PO TBEC
40.0000 mg | DELAYED_RELEASE_TABLET | Freq: Every day | ORAL | Status: DC
  Administered 2016-05-06 – 2016-05-09 (×4): 40 mg via ORAL
  Filled 2016-05-06 (×4): qty 1

## 2016-05-06 NOTE — Progress Notes (Signed)
Admitted for cystitis. Patient has recurrent UTIs. Started on IV meropenem. She had suprapubic pain. Continue IV meropenem, urine cultures. She is tachycardic, continue IV hydration. Encourage by mouth intake. Chart reviewed, medications reviewed. Discussed with patient, registered nurse The fibrillation patient is on Cardizem, beta blockers, eliquis.. Time spent;20 min

## 2016-05-06 NOTE — ED Notes (Signed)
Dr. Jannifer Franklin in to assess. Delay in getting pt upstairs due to md at bedside.

## 2016-05-06 NOTE — Progress Notes (Signed)
Patient converted to a.fibb with heart rate in the 150s-170s. MD notified. Acknowledged. New orders placed. Patient converted back to normal sinus immediately.

## 2016-05-06 NOTE — H&P (Signed)
[facility] at [place] NAME: Evelyn [name]    MR#:  [id number]  DATE OF BIRTH:  [birth year]  DATE OF ADMISSION:  05/05/2016  PRIMARY CARE PHYSICIAN: Crecencio Mc, MD   REQUESTING/REFERRING PHYSICIAN: Marcelene Butte, MD  CHIEF COMPLAINT:   Chief Complaint  Patient presents with  . Cystitis    HISTORY OF PRESENT ILLNESS:  Evelyn [name]  is a 82 y.o. female who presents with malaise and dysuria.  Patient has a history of chronic recurrent UTIs. Today she presents to the ED meeting criteria for sepsis with tachycardia and elevated white blood cell count. She was started on IV antibiotics per sepsis protocol and hospitalists were called for admission. Patient's son who is with her in the ED today states that she is also been having some intermittent episodes of confusion over the same timeframe as her dysuria.  PAST MEDICAL HISTORY:   Past Medical History:  Diagnosis Date  . Anemia    hx of blood transfusion  . Cancer ([place])    lymphomia  . Coarse tremors    head shakes if does not take propranolol  . Coronary artery disease   . GERD (gastroesophageal reflux disease)   . History of lymphoma   . Hyperlipidemia   . Hypertension   . Insomnia    takes remeron nightly  . Other chronic cystitis    Radiation , botox injections  . Self-catheterizes urinary bladder    twice daily  . Thyroid disease    had thyroid removed    PAST SURGICAL HISTORY:   Past Surgical History:  Procedure Laterality Date  . ABDOMINAL HYSTERECTOMY  1970s  . APPENDECTOMY    . BACK SURGERY     lower back - bulging disk  . CARDIAC CATHETERIZATION  2005   2 cardiac stents  . CYSTOSCOPY  April 2013  . EYE SURGERY     bilateral cataracts  . JOINT REPLACEMENT  2012   right hip  . LUMBAR LAMINECTOMY/DECOMPRESSION MICRODISCECTOMY  01/24/2012   Procedure: LUMBAR LAMINECTOMY/DECOMPRESSION MICRODISCECTOMY 2 LEVELS;  Surgeon: Floyce Stakes, MD;  Location:  [facility];  Service: Neurosurgery;  Laterality: Right;  Right Lumbar four five, lumbar five sacral one foraminotomy   . [place] SURGERY  2008   [place]  . STOMACH SURGERY     removal all but small portion of stomach out due to ulcers    SOCIAL HISTORY:   Social History  Substance Use Topics  . Smoking status: Former Smoker    Quit date: 04/04/1971  . Smokeless tobacco: Never Used  . Alcohol use No    FAMILY HISTORY:   Family History  Problem Relation Age of Onset  . Diabetes Mother   . Heart disease Father   . Kidney disease Neg Hx   . Bladder Cancer Neg Hx     DRUG ALLERGIES:   Allergies  Allergen Reactions  . Cephalosporins Other (See Comments)  . Macrolides And Ketolides Other (See Comments)  . Oxycodone Nausea Only  . Ciprofloxacin Swelling, Rash and Other (See Comments)  . Neomy-Bacit-Polymyx-Pramoxine Rash    "All antibiotics"  . Other Rash    "All antibiotics"  . Penicillins Swelling, Rash and Other (See Comments)    Pt is unable to answer additional questions about this medication.    . Prednisone Rash and Other (See Comments)  . Sulfa Antibiotics Swelling, Rash and Other (See Comments)    MEDICATIONS AT HOME:   Prior to Admission  medications   Medication Sig Start Date End Date Taking? Authorizing Provider  apixaban (ELIQUIS) 2.5 MG TABS tablet Take 2.5 mg by mouth 2 (two) times daily.   Yes Historical Provider, MD  conjugated estrogens (PREMARIN) vaginal cream Place 1 Applicatorful vaginally every Monday, Wednesday, and Friday.    Yes Historical Provider, MD  Cranberry 250 MG TABS Take 1 tablet (250 mg total) by mouth 2 (two) times daily. 02/21/16  Yes Shannon A McGowan, PA-C  levothyroxine (SYNTHROID, LEVOTHROID) 100 MCG tablet Take 100 mcg by mouth daily.   Yes Historical Provider, MD  loperamide (IMODIUM A-D) 2 MG tablet Take 4 mg by mouth 4 (four) times daily as needed for diarrhea or loose stools.   Yes Historical Provider, MD  mirtazapine (REMERON)  7.5 MG tablet Take 1 tablet (7.5 mg total) by mouth at bedtime. 06/22/15  Yes Crecencio Mc, MD  Multiple Vitamins-Minerals (MULTIVITAMIN WITH MINERALS) tablet Take 1 tablet by mouth daily.   Yes Historical Provider, MD  omeprazole (PRILOSEC) 20 MG capsule Take 20 mg by mouth daily.   Yes Historical Provider, MD  propranolol (INDERAL) 20 MG tablet Take 1.5 tablets (30 mg total) by mouth 2 (two) times daily. 01/25/16  Yes Crecencio Mc, MD  tolterodine (DETROL) 2 MG tablet Take 4 mg by mouth daily.   Yes Historical Provider, MD  vitamin B-12 (CYANOCOBALAMIN) 1000 MCG tablet Take 1,000 mcg by mouth daily.   Yes Historical Provider, MD  zolpidem (AMBIEN) 5 MG tablet Take 5 mg by mouth at bedtime as needed for sleep.   Yes Historical Provider, MD  fesoterodine (TOVIAZ) 4 MG TB24 tablet Take 1 tablet (4 mg total) by mouth daily. Patient not taking: Reported on 05/05/2016 03/13/16   Nori Riis, PA-C    REVIEW OF SYSTEMS:  Review of Systems  Constitutional: Positive for malaise/fatigue. Negative for chills, fever and weight loss.  HENT: Negative for ear pain, hearing loss and tinnitus.   Eyes: Negative for blurred vision, double vision, pain and redness.  Respiratory: Negative for cough, hemoptysis and shortness of breath.   Cardiovascular: Negative for chest pain, palpitations, orthopnea and leg swelling.  Gastrointestinal: Negative for abdominal pain, constipation, diarrhea, nausea and vomiting.  Genitourinary: Positive for dysuria. Negative for frequency and hematuria.  Musculoskeletal: Negative for back pain, joint pain and neck pain.  Skin:       No acne, rash, or lesions  Neurological: Positive for weakness. Negative for dizziness, tremors and focal weakness.  Endo/Heme/Allergies: Negative for polydipsia. Does not bruise/bleed easily.  Psychiatric/Behavioral: Negative for depression. The patient is not nervous/anxious and does not have insomnia.      VITAL SIGNS:   Vitals:    05/05/16 2245 05/05/16 2300 05/05/16 2330 05/05/16 2345  BP:  133/83 136/86   Pulse: (!) 116 (!) 117 (!) 121 (!) 112  Resp: (!) 21 (!) 23 (!) 23 (!) 25  Temp:      TempSrc:      SpO2: 100% 99% 98% 100%  Weight:      Height:       Wt Readings from Last 3 Encounters:  05/05/16 63.5 kg (140 lb)  03/13/16 59.9 kg (132 lb)  02/21/16 59.2 kg (130 lb 8 oz)    PHYSICAL EXAMINATION:  Physical Exam  Vitals reviewed. Constitutional: She is oriented to person, place, and time. She appears well-developed and well-nourished. No distress.  HENT:  Head: Normocephalic and atraumatic.  Mouth/Throat: Oropharynx is clear and moist.  Eyes: Conjunctivae and  EOM are normal. Pupils are equal, round, and reactive to light. No scleral icterus.  Neck: Normal range of motion. Neck supple. No JVD present. No thyromegaly present.  Cardiovascular: Intact distal pulses.  Exam reveals no gallop and no friction rub.   No murmur heard. Irregular, tachycardic  Respiratory: Effort normal and breath sounds normal. No respiratory distress. She has no wheezes. She has no rales.  GI: Soft. Bowel sounds are normal. She exhibits no distension. There is no tenderness.  Musculoskeletal: Normal range of motion. She exhibits no edema.  No arthritis, no gout  Lymphadenopathy:    She has no cervical adenopathy.  Neurological: She is alert and oriented to person, place, and time. No cranial nerve deficit.  No dysarthria, no aphasia  Skin: Skin is warm and dry. No rash noted. No erythema.  Psychiatric: She has a normal mood and affect. Her behavior is normal. Judgment and thought content normal.    LABORATORY PANEL:   CBC  Recent Labs Lab 05/05/16 1820  WBC 12.3*  HGB 12.8  HCT 39.3  PLT 382   ------------------------------------------------------------------------------------------------------------------  Chemistries   Recent Labs Lab 05/05/16 1820  NA 136  K 4.4  CL 103  CO2 25  GLUCOSE 142*  BUN 16   CREATININE 1.21*  CALCIUM 8.9  AST 17  ALT 9*  ALKPHOS 70  BILITOT 0.4   ------------------------------------------------------------------------------------------------------------------  Cardiac Enzymes No results for input(s): TROPONINI in the last 168 hours. ------------------------------------------------------------------------------------------------------------------  RADIOLOGY:  No results found.  EKG:   Orders placed or performed during the hospital encounter of 05/05/16  . ED EKG 12-Lead  . ED EKG 12-Lead  . EKG 12-Lead  . EKG 12-Lead  . EKG 12-Lead  . EKG 12-Lead    IMPRESSION AND PLAN:  Principal Problem:   Sepsis ([place]) - broad spectrum IV antibiotics started in the ED, continue on admission. Lactic acid within normal limits. Hemodynamically stable. Cultures sent from the ED. Active Problems:   Urinary tract infection - antibiotics and cultures as above   Paroxysmal atrial fibrillation (H[facility]) - continue home rate controlling meds, treat infection and other exacerbating conditions as above   Hypertension - stable, continue home meds   Chronic diastolic heart failure ([place]) - continue home meds, not currently in exacerbation   Hypothyroidism - home dose thyroid replacement  All the records are reviewed and case discussed with ED provider. Management plans discussed with the patient and/or family.  DVT PROPHYLAXIS: Systemic anticoagulation  GI PROPHYLAXIS: PPI  ADMISSION STATUS: Inpatient  CODE STATUS: Full Code Status History    Date Active Date Inactive Code Status Order ID Comments User Context   05/05/2015  8:48 PM 05/10/2015  5:35 PM Full Code [id number]  Henreitta Leber, MD Inpatient      TOTAL TIME TAKING CARE OF THIS PATIENT: 45 minutes.    Lanell Carpenter [place] 05/06/2016, 12:10 AM  [facility]  Office  [phone]  CC: Primary care physician; Crecencio Mc, MD

## 2016-05-07 LAB — URINE CULTURE

## 2016-05-07 NOTE — Care Management Important Message (Signed)
Important Message  Patient Details  Name: Evelyn [name] MRN: [id number] Date of Birth: 1920/06/02   Medicare Important Message Given:  Yes    Carra Brindley A, RN 05/07/2016, 2:39 PM

## 2016-05-07 NOTE — Progress Notes (Signed)
[facility] at [place] NAME: Evelyn [name]    MR#:  [id number]  DATE OF BIRTH:  05-25-1920  SUBJECTIVE: Admitted for cystitis, UTI. Patient very hard of hearing and seems slightly confused. Afebrile.  CHIEF COMPLAINT:   Chief Complaint  Patient presents with  . Cystitis    REVIEW OF SYSTEMS:   ROS CONSTITUTIONAL: No fever, fatigue or weakness.  EYES: No blurred or double vision.  EARS, NOSE, AND THROAT: No tinnitus or ear pain.  RESPIRATORY: No cough, shortness of breath, wheezing or hemoptysis.  CARDIOVASCULAR: No chest pain, orthopnea, edema.  GASTROINTESTINAL: No nausea, vomiting, diarrhea or abdominal pain.  GENITOURINARY: No dysuria, hematuria.  ENDOCRINE: No polyuria, nocturia,  HEMATOLOGY: No anemia, easy bruising or bleeding SKIN: No rash or lesion. MUSCULOSKELETAL: No joint pain or arthritis.   NEUROLOGIC: No tingling, numbness, weakness.  PSYCHIATRY: No anxiety or depression.   DRUG ALLERGIES:   Allergies  Allergen Reactions  . Cephalosporins Other (See Comments)  . Macrolides And Ketolides Other (See Comments)  . Oxycodone Nausea Only  . Ciprofloxacin Swelling, Rash and Other (See Comments)  . Neomy-Bacit-Polymyx-Pramoxine Rash    "All antibiotics"  . Other Rash    "All antibiotics"  . Penicillins Swelling, Rash and Other (See Comments)    Pt is unable to answer additional questions about this medication.    . Prednisone Rash and Other (See Comments)  . Sulfa Antibiotics Swelling, Rash and Other (See Comments)    VITALS:  Blood pressure 118/67, pulse 69, temperature 98.2 F (36.8 C), temperature source Oral, resp. rate 16, height 5\' 6"  (1.676 m), weight 58.2 kg (128 lb 4.8 oz), SpO2 98 %.  PHYSICAL EXAMINATION:  GENERAL:  82 y.o.-year-old patient lying in the bed with no acute distress.  EYES: Pupils equal, round, reactive to light and accommodation. No scleral icterus. Extraocular muscles intact.   HEENT: Head atraumatic, normocephalic. Oropharynx and nasopharynx clear.  NECK:  Supple, no jugular venous distention. No thyroid enlargement, no tenderness.  LUNGS: Normal breath sounds bilaterally, no wheezing, rales,rhonchi or crepitation. No use of accessory muscles of respiration.  CARDIOVASCULAR: S1, S2 normal. No murmurs, rubs, or gallops.  ABDOMEN: Soft, nontender, nondistended. Bowel sounds present. No organomegaly or mass.  EXTREMITIES: No pedal edema, cyanosis, or clubbing.  NEUROLOGIC: Cranial nerves II through XII are intact. Muscle strength 5/5 in all extremities. Sensation intact. Gait not checked.  PSYCHIATRIC: The patient is alert , slightly confused. SKIN: No obvious rash, lesion, or ulcer.    LABORATORY PANEL:   CBC  Recent Labs Lab 05/06/16 0402  WBC 12.1*  HGB 11.5*  HCT 34.1*  PLT 328   ------------------------------------------------------------------------------------------------------------------  Chemistries   Recent Labs Lab 05/05/16 1820 05/06/16 0402  NA 136 139  K 4.4 3.8  CL 103 107  CO2 25 26  GLUCOSE 142* 122*  BUN 16 16  CREATININE 1.21* 1.06*  CALCIUM 8.9 8.2*  AST 17  --   ALT 9*  --   ALKPHOS 70  --   BILITOT 0.4  --    ------------------------------------------------------------------------------------------------------------------  Cardiac Enzymes No results for input(s): TROPONINI in the last 168 hours. ------------------------------------------------------------------------------------------------------------------  RADIOLOGY:  No results found.  EKG:   Orders placed or performed during the hospital encounter of 05/05/16  . ED EKG 12-Lead  . ED EKG 12-Lead  . EKG 12-Lead  . EKG 12-Lead  . EKG 12-Lead  . EKG 12-Lead    ASSESSMENT AND PLAN:   82 year old female  patient with history of recurrent UTIs came in because of  dysuria, malaise, elevated white count. Admitted for sepsis. #1 sepsis due to UTI: Continue  meropenem, repeat urine cultures today patient had recurrent UTIs. #2 .proximal atrial fibrillation: Continue home medications Essential hypertension: Controlled Chronic diastolic heart failure: Stable. Hypothyroidism continue Synthroid.   All the records are reviewed and case discussed with Care Management/Social Workerr. Management plans discussed with the patient, family and they are in agreement.  CODE STATUS: Full code  TOTAL TIME TAKING CARE OF THIS PATIENT: 35 minutes.   POSSIBLE D/C IN 1-2DAYS, DEPENDING ON CLINICAL CONDITION.   Epifanio Lesches M.D on 05/07/2016 at 11:27 AM  Between 7am to 6pm - Pager - [phone]  After 6pm go to [email or web address] - password [email or web address] [facility]  Office  [phone]  CC: Primary care physician; Crecencio Mc, MD   Note: This dictation was prepared with Dragon dictation along with smaller phrase technology. Any transcriptional errors that result from this process are unintentional.

## 2016-05-08 NOTE — Care Management (Signed)
Found that patient is admitted from [facility] living.  She "knows she is not 'there" but unable to name the name of the facility.  When informed her she was in the hospital- she says"that is not where I live>'  Found the name of the facility in patient's shadow chart.  She is confused .  Updated CSW that patient is from a facility

## 2016-05-08 NOTE — Progress Notes (Signed)
[facility] at [place] NAME: Evelyn [name]    MR#:  [id number]  DATE OF BIRTH:  January 05, 1920  SUBJECTIVE:   CHIEF COMPLAINT:   Chief Complaint  Patient presents with  . Cystitis   Afebrile. No abdominal pain. Some confusion.  REVIEW OF SYSTEMS:   ROS CONSTITUTIONAL: No fever, fatigue or weakness.  EYES: No blurred or double vision.  EARS, NOSE, AND THROAT: No tinnitus or ear pain.  RESPIRATORY: No cough, shortness of breath, wheezing or hemoptysis.  CARDIOVASCULAR: No chest pain, orthopnea, edema.  GASTROINTESTINAL: No nausea, vomiting, diarrhea or abdominal pain.  GENITOURINARY: No dysuria, hematuria.  ENDOCRINE: No polyuria, nocturia,  HEMATOLOGY: No anemia, easy bruising or bleeding SKIN: No rash or lesion. MUSCULOSKELETAL: No joint pain or arthritis.   NEUROLOGIC: No tingling, numbness, weakness.  PSYCHIATRY: No anxiety or depression.   DRUG ALLERGIES:   Allergies  Allergen Reactions  . Cephalosporins Other (See Comments)  . Macrolides And Ketolides Other (See Comments)  . Oxycodone Nausea Only  . Ciprofloxacin Swelling, Rash and Other (See Comments)  . Neomy-Bacit-Polymyx-Pramoxine Rash    "All antibiotics"  . Other Rash    "All antibiotics"  . Penicillins Swelling, Rash and Other (See Comments)    Pt is unable to answer additional questions about this medication.    . Prednisone Rash and Other (See Comments)  . Sulfa Antibiotics Swelling, Rash and Other (See Comments)    VITALS:  Blood pressure (!) 122/91, pulse 74, temperature 98.3 F (36.8 C), resp. rate 19, height 5\' 6"  (1.676 m), weight 58.4 kg (128 lb 12.8 oz), SpO2 95 %.  PHYSICAL EXAMINATION:  GENERAL:  82 y.o.-year-old patient lying in the bed with no acute distress.  EYES: Pupils equal, round, reactive to light and accommodation. No scleral icterus. Extraocular muscles intact.  HEENT: Head atraumatic, normocephalic. Oropharynx and nasopharynx  clear.  NECK:  Supple, no jugular venous distention. No thyroid enlargement, no tenderness.  LUNGS: Normal breath sounds bilaterally, no wheezing, rales,rhonchi or crepitation. No use of accessory muscles of respiration.  CARDIOVASCULAR: S1, S2 normal. No murmurs, rubs, or gallops.  ABDOMEN: Soft, nontender, nondistended. Bowel sounds present. No organomegaly or mass.  EXTREMITIES: No pedal edema, cyanosis, or clubbing.  NEUROLOGIC: Cranial nerves II through XII are intact. Muscle strength 5/5 in all extremities. Sensation intact. Gait not checked.  PSYCHIATRIC: The patient is alert and awake. , slightly confused. SKIN: No obvious rash, lesion, or ulcer.    LABORATORY PANEL:   CBC  Recent Labs Lab 05/06/16 0402  WBC 12.1*  HGB 11.5*  HCT 34.1*  PLT 328   ------------------------------------------------------------------------------------------------------------------  Chemistries   Recent Labs Lab 05/05/16 1820 05/06/16 0402  NA 136 139  K 4.4 3.8  CL 103 107  CO2 25 26  GLUCOSE 142* 122*  BUN 16 16  CREATININE 1.21* 1.06*  CALCIUM 8.9 8.2*  AST 17  --   ALT 9*  --   ALKPHOS 70  --   BILITOT 0.4  --    ------------------------------------------------------------------------------------------------------------------  Cardiac Enzymes No results for input(s): TROPONINI in the last 168 hours. ------------------------------------------------------------------------------------------------------------------  RADIOLOGY:  No results found.  EKG:   Orders placed or performed during the hospital encounter of 05/05/16  . ED EKG 12-Lead  . ED EKG 12-Lead  . EKG 12-Lead  . EKG 12-Lead  . EKG 12-Lead  . EKG 12-Lead    ASSESSMENT AND PLAN:   82 year old female patient with history of recurrent UTIs came  in because of  dysuria, malaise, elevated white count. Admitted for sepsis  * UTI with sepsis Multiple bacteria and cultures. Has history of ESBL in the  past. Continue meropenem 1 more day. Likely discharge tomorrow.  # Paroxismal atrial fibrillation: Continue home medications  # Essential hypertension: Controlled  # Chronic diastolic heart failure: Stable.  # Hypothyroidism continue Synthroid.   All the records are reviewed and case discussed with Care Management/Social Workerr. Management plans discussed with the patient, family and they are in agreement.  CODE STATUS: Full code  TOTAL TIME TAKING CARE OF THIS PATIENT: 35 minutes.   POSSIBLE D/C IN 1-2 DAYS, DEPENDING ON CLINICAL CONDITION.   Hillary Bow R M.D on 05/08/2016 at 1:18 PM  Between 7am to 6pm - Pager - [phone]  After 6pm go to [email or web address] - password [email or web address] [facility]  Office  [phone]  CC: Primary care physician; Crecencio Mc, MD   Note: This dictation was prepared with Dragon dictation along with smaller phrase technology. Any transcriptional errors that result from this process are unintentional.

## 2016-05-08 NOTE — Progress Notes (Signed)
Pharmacy Antibiotic Note  COYLA LUTCHMAN is a 82 y.o. female admitted on 05/05/2016 with sepsis/?UTI.  Pharmacy has been consulted for meropenem dosing. Hx UTIs.  Plan: Meropenem 1 gram q 12 hours ordered.   Height: 5\' 6"  (167.6 cm) Weight: 128 lb 12.8 oz (58.4 kg) IBW/kg (Calculated) : 59.3  Temp (24hrs), Avg:97.9 F (36.6 C), Min:97.4 F (36.3 C), Max:98.6 F (37 C)   Recent Labs Lab 05/05/16 1820 05/05/16 2238 05/06/16 0402  WBC 12.3*  --  12.1*  CREATININE 1.21*  --  1.06*  LATICACIDVEN  --  1.8  --     Estimated Creatinine Clearance: 30.6 mL/min (by C-G formula based on SCr of 1.06 mg/dL (H)).    Allergies  Allergen Reactions  . Cephalosporins Other (See Comments)  . Macrolides And Ketolides Other (See Comments)  . Oxycodone Nausea Only  . Ciprofloxacin Swelling, Rash and Other (See Comments)  . Neomy-Bacit-Polymyx-Pramoxine Rash    "All antibiotics"  . Other Rash    "All antibiotics"  . Penicillins Swelling, Rash and Other (See Comments)    Pt is unable to answer additional questions about this medication.    . Prednisone Rash and Other (See Comments)  . Sulfa Antibiotics Swelling, Rash and Other (See Comments)    Antimicrobials this admission: meropenem  10/6>>    >>   Dose adjustments this admission:   Microbiology results: 10/6 BCx: NGTD 10/6 UCx: multiple speices 10/6 UA: LE(+) NO2(-) WBC TNTC  Thank you for allowing pharmacy to be a part of this patient's care.  Rielly Brunn A 05/08/2016 8:40 AM

## 2016-05-09 ENCOUNTER — Telehealth: Payer: Self-pay | Admitting: Internal Medicine

## 2016-05-09 NOTE — Discharge Instructions (Signed)
Resume diet and activity as before ° ° °

## 2016-05-09 NOTE — Progress Notes (Signed)
Patient discharged to [facility].  Son is transporting her.  Information about upcoming appointments and medications discussed with son and sent in written form. Patient is happy to be returning home and is pleased with her care here.

## 2016-05-09 NOTE — Telephone Encounter (Signed)
HFU, Pt is being discharged from hospital today. Dx was Sepsis. Pt is scheduled for 05/12/16 @ 9am. Thank you!

## 2016-05-09 NOTE — NC FL2 (Signed)
[place] LEVEL OF CARE SCREENING TOOL     IDENTIFICATION  Patient Name: Evelyn [name] Birthdate: 1920/06/26 Sex: female Admission Date (Current Location): 05/05/2016  [place] and [place] Number:  Engineering geologist and Address:  [facility], [address], [place], [place] [address]      Provider Number: [id number]  Attending Physician Name and Address:  Hillary Bow, MD  Relative Name and Phone Number:  Rocelia, Dagraca [phone]  [phone] or Sundas, Charnley [phone]  [phone]     Current Level of Care: Hospital Recommended Level of Care: [place] Prior Approval Number:    Date Approved/Denied:   PASRR Number:    Discharge Plan: Domiciliary (Rest home) [facility])    Current Diagnoses: Patient Active Problem List   Diagnosis Date Noted  . Sepsis ([place]) 05/05/2016  . Edema extremities 01/26/2016  . Skin benign neoplasm 01/26/2016  . Unsteadiness 07/14/2015  . Anorexia 06/22/2015  . Loss of weight 06/22/2015  . Complicated UTI (urinary tract infection) 05/07/2015  . Syncope 05/05/2015  . Chronic diastolic heart failure ([place]) 03/04/2015  . Acute on chronic diastolic heart failure ([place]) 06/04/2014  . Paroxysmal atrial fibrillation ([place]) 05/29/2014  . Candidiasis of perineum 05/27/2014  . Tachycardia 05/27/2014  . Diarrhea 12/18/2013  . Rectal pain 12/14/2013  . ESBL (extended spectrum beta-lactamase) producing bacteria infection 12/09/2013  . Unspecified vitamin D deficiency 11/25/2013  . Chronic insomnia 11/23/2013  . History of recent fall 11/23/2013  . Bradycardia 04/10/2013  . Generalized muscle weakness 04/10/2013  . Anemia, iron deficiency 12/27/2012  . Dysphagia, pharyngoesophageal phase 07/06/2012  . Anemia, pernicious 10/15/2011  . Spinal stenosis of lumbar region 10/15/2011  . Hypertension   . Hyperlipidemia   . Hypothyroidism   . Cystitis, chronic   . History  of lymphoma   . Urinary tract infection 07/14/2011    Orientation RESPIRATION BLADDER Height & Weight     Self, Time, Situation, Place  Normal Incontinent Weight: 131 lb 12.8 oz (59.8 kg) Height:  5\' 6"  (167.6 cm)  BEHAVIORAL SYMPTOMS/MOOD NEUROLOGICAL BOWEL NUTRITION STATUS      Continent Diet (Cardiac)  AMBULATORY STATUS COMMUNICATION OF NEEDS Skin   Supervision Verbally Normal                       Personal Care Assistance Level of Assistance  Bathing, Feeding, Dressing Bathing Assistance: Limited assistance Feeding assistance: Independent Dressing Assistance: Independent     Functional Limitations Info  Sight, Speech Sight Info: Adequate Hearing Info: Impaired Speech Info: Adequate    SPECIAL CARE FACTORS FREQUENCY  PT (By licensed PT)     PT Frequency: 2x a week   home health             Contractures Contractures Info: Not present    Additional Factors Info  Code Status, Allergies Code Status Info: Full Code Allergies Info: CEPHALOSPORINS, MACROLIDES AND KETOLIDES, OXYCODONE, CIPROFLOXACIN, NEOMY-BACIT-POLYMYX-PRAMOXINE, OTHER, PENICILLINS, PREDNISONE, SULFA ANTIBIOTICS            Current Medications (05/09/2016):  This is the current hospital active medication list Current Facility-Administered Medications  Medication Dose Route Frequency Provider Last Rate Last Dose  . acetaminophen (TYLENOL) tablet 650 mg  650 mg Oral Q6H PRN Lance Coon, MD   650 mg at 05/06/16 0155   Or  . acetaminophen (TYLENOL) suppository 650 mg  650 mg Rectal Q6H PRN Lance Coon, MD      . apixaban Arne [place]) tablet  2.5 mg  2.5 mg Oral BID Lance Coon, MD   2.5 mg at 05/09/16 1007  . levothyroxine (SYNTHROID, LEVOTHROID) tablet 100 mcg  100 mcg Oral QAC breakfast Lance Coon, MD   100 mcg at 05/09/16 0748  . meropenem (MERREM) 1 g in sodium chloride 0.9 % 100 mL IVPB  1 g Intravenous Q12H Daymon Larsen, MD   1 g at 05/09/16 1007  . mirtazapine (REMERON) tablet 7.5 mg   7.5 mg Oral QHS Lance Coon, MD   7.5 mg at 05/08/16 2123  . ondansetron (ZOFRAN) tablet 4 mg  4 mg Oral Q6H PRN Lance Coon, MD       Or  . ondansetron [facility]) injection 4 mg  4 mg Intravenous Q6H PRN Lance Coon, MD      . pantoprazole (PROTONIX) EC tablet 40 mg  40 mg Oral Daily Lance Coon, MD   40 mg at 05/09/16 1007  . propranolol (INDERAL) tablet 30 mg  30 mg Oral BID Lance Coon, MD   30 mg at 05/09/16 1007  . sodium chloride flush (NS) 0.9 % injection 3 mL  3 mL Intravenous Q12H Lance Coon, MD   3 mL at 05/09/16 1008     Discharge Medications: Please see discharge summary for a list of discharge medications.  Relevant Imaging Results:  Relevant Lab Results:   Additional Information SSN [id number] Patient has orders for Home Health PT, case manager has set this up.  Ross Ludwig

## 2016-05-09 NOTE — Care Management (Signed)
Physical therapy has recommended home health .  [facility] referral to [facility] as this agency provides home health services to [facility]

## 2016-05-09 NOTE — Evaluation (Signed)
Physical Therapy Evaluation Patient Details Name: Evelyn [name] MRN: [id number] DOB: [birth year] Today's Date: 05/09/2016   History of Present Illness  82 y.o. female who presents with malaise and dysuria.  Patient has a history of chronic recurrent UTIs. Admitted to [facility] meeting criteria for sepsis with tachycardia and elevated white blood cell count.   Clinical Impression  Pt able to participate with PT exam, though she was confused and needed a lot of cuing/encouragement to perform requested acts. She shows good overall mobility and was able to do some limited ambulation in the room w/o direct phyiscal assist and though she was safe she did have some hesitation/guarding.  Pt weak in UEs and to a lesser degree LEs, but ultimately she should be able to return to her assisted living facility and would benefit from having PT in that setting to work on strength, activity tolerance and general mobility.  She was coughing t/o the session and multiple times got up significant amounts of phlegm.         Follow Up Recommendations Home health PT    Equipment Recommendations       Recommendations for Other Services       Precautions / Restrictions Precautions Precautions: Fall Restrictions Weight Bearing Restrictions: No      Mobility  Bed Mobility Overal bed mobility: Modified Independent             General bed mobility comments: Pt is slow with movement to EOB, but using rail was able to get to upright without direct assist  Transfers Overall transfer level: Modified independent Equipment used: Rolling walker (2 wheeled)             General transfer comment: Pt is able to rise to standing w/o direct assist, she is reliant on the walker to maintain balance once standing, but generally showed appropriate confidence and strength to perfrom  Ambulation/Gait Ambulation/Gait assistance: Min guard Ambulation Distance (Feet): 35 Feet Assistive device: Rolling walker (2  wheeled)       General Gait Details: Pt with slow but consistent ambulation using walker.  She has (+) Trendelenberg with R hip (she reports no pain but says it is not her normal when asked).  Pt with some fatigue after this bout of ambulation but generally was safe and showed good confidence despite slow/cautious ambulation.   Stairs            Wheelchair Mobility    Modified Rankin (Stroke Patients Only)       Balance Overall balance assessment: Modified Independent                                           Pertinent Vitals/Pain Pain Assessment: No/denies pain    Home Living Family/patient expects to be discharged to:: Assisted living               Home Equipment: Walker - 2 wheels Additional Comments: per chart - [facility] (pt unable to report)    Prior Function Level of Independence: Independent with assistive device(s)         Comments: Apparently she could walk down to the dinning hall and take her self to the bathroom with walker and no direct assist     Hand Dominance        Extremity/Trunk Assessment   Upper Extremity Assessment: Generalized weakness (b/l UEs grossly 3/5 in limited  range)           Lower Extremity Assessment: Generalized weakness (grossly 3+ to 4-/5 in b/l LEs)         Communication   Communication: HOH (pt has some confusion t/o the PT exam)  Cognition Arousal/Alertness: Awake/alert Behavior During Therapy: Restless Overall Cognitive Status: Difficult to assess                      General Comments      Exercises     Assessment/Plan    PT Assessment Patient needs continued PT services  PT Problem List Decreased strength;Decreased balance;Decreased safety awareness;Decreased knowledge of use of DME;Decreased activity tolerance;Decreased mobility;Decreased cognition          PT Treatment Interventions Gait training;Functional mobility training;Therapeutic  activities;Therapeutic exercise;Balance training;Cognitive remediation    PT Goals (Current goals can be found in the Care Plan section)  Acute Rehab PT Goals Patient Stated Goal: Pt confused about situation, does not state goals PT Goal Formulation: Patient unable to participate in goal setting Time For Goal Achievement: 05/23/16 Potential to Achieve Goals: Fair    Frequency Min 2X/week   Barriers to discharge        Co-evaluation               End of Session Equipment Utilized During Treatment: Gait belt Activity Tolerance: Patient limited by fatigue Patient left: with chair alarm set;with call bell/phone within reach           Time: 1014-1036 PT Time Calculation (min) (ACUTE ONLY): 22 min   Charges:   PT Evaluation $PT Eval Low Complexity: 1 Procedure     PT G CodesKreg Shropshire, DPT 05/09/2016, 11:48 AM

## 2016-05-09 NOTE — Telephone Encounter (Signed)
Transition Care Management Follow-up Telephone Call  How have you been since you were released from the hospital? Fine, little confused still but that was expected.  Son brought patient back to [place].    Do you understand why you were in the hospital? Yes she is aware of why she was in the hospital   Do you understand the discharge instrcutions? No questions regarding instructions, son has at reviewed them and passed them on to the staff at [facility].   Items Reviewed:  Medications reviewed: antibiotic continued all other medications are the same  Allergies reviewed: yes  Dietary changes reviewed: no changes  Referrals reviewed: no new referrals   Functional Questionnaire:   Activities of Daily Living (ADLs):   She states they are independent in the following: minimal assistance, no change to prior to admission States they require assistance with the following: uses walker   Any transportation issues/concerns?: no- med tech has information to have transportation set up for follow up appt and son is planning to bring for physical later in the month   Any patient concerns? Not at this time, except they need her drivers tag for handicap tag renewed for when they have to transport her.  They will bring to the appt.    Confirmed importance and date/time of follow-up visits scheduled: 13th for follow up, 27th physical and then    Confirmed with patient if condition begins to worsen call PCP or go to the ER.  Patient was given the Call-a-Nurse line [phone]: yes

## 2016-05-09 NOTE — Clinical Social Work Note (Addendum)
MSW spoke to [facility], they said they can accept patient back today.  Patient to be d/c'ed today to [facility].  Patient and family agreeable to plans will transport via her son.  Patient is going to ALF no report needed.  Evette Cristal, MSW Mon-Fri 8a-4:30p [phone]

## 2016-05-09 NOTE — Clinical Social Work Note (Signed)
Clinical Social Work Assessment  Patient Details  Name: CLELIA SCHWAN MRN: [id number] Date of Birth: [birth year]  Date of referral:  05/09/16               Reason for consult:  Facility Placement                Permission sought to share information with:  Family Supports Permission granted to share information::  Yes, Verbal Permission Granted  Name::     Chantale, Nigh [phone]  [phone] or Corla, [place] [phone]  [phone]   Agency::  [facility]  Relationship::     Contact Information:     Housing/Transportation Living arrangements for the past 2 months:  [place] of Information:  Adult Children Patient Interpreter Needed:  None Criminal Activity/Legal Involvement Pertinent to Current Situation/Hospitalization:  No - Comment as needed Significant Relationships:  Adult Children Lives with:  Facility Resident Do you feel safe going back to the place where you live?  Yes Need for family participation in patient care:  Yes (Comment)  Care giving concerns:  Patient and son do not have any concerns about returning back to ALF with home health.   Social Worker assessment / plan:  Patient is a 82 year old female who is a resident at [facility].  Patient was sleeping but son was at bedside.  MSW spoke to son to complete assessment, patient's son states she has been living at [facility] for just over a year and they have been pleased with the care that has been provided.  Patient's son states she gets UTIs quite frequently and that is what brought her into the hospital this time again.  Patient's son states that she usually is able to walk to the dining room with her walker and she is able to use the bathroom normally, but she is incontinent at times.  Patient's son was informed that home health PT has been ordered to help her once she returns to ALF.  Patient's son did not express any other concerns or issues.  Patient's son did not have  any other questions.  Employment status:  Retired Nurse, adult PT Recommendations:  Home with [place] / Referral to community resources:     Patient/Family's Response to care:  Patient is agreeable to returning back to ALF.  Patient/Family's Understanding of and Emotional Response to Diagnosis, Current Treatment, and Prognosis:  Patient's family is aware of current diagnosis and treatment plan.  Emotional Assessment Appearance:  Appears stated age Attitude/Demeanor/Rapport:    Affect (typically observed):  Appropriate, Calm Orientation:  Oriented to Self, Oriented to Place, Oriented to  Time, Oriented to Situation Alcohol / Substance use:  Not Applicable Psych involvement (Current and /or in the community):  No (Comment)  Discharge Needs  Concerns to be addressed:  No discharge needs identified Readmission within the last 30 days:  No Current discharge risk:  None Barriers to Discharge:  No Barriers Identified   Ross Ludwig 05/09/2016, 3:57 PM

## 2016-05-09 NOTE — Discharge Summary (Signed)
[facility] [place] NAME: Evelyn [name]    MR#:  [id number]  DATE OF BIRTH:  Apr 30, 1920  DATE OF ADMISSION:  05/05/2016 ADMITTING PHYSICIAN: Lance Coon, MD  DATE OF DISCHARGE: 05/09/2016  PRIMARY CARE PHYSICIAN: Crecencio Mc, MD   ADMISSION DIAGNOSIS:  Sepsis due to urinary tract infection ([place]) [A41.9, N39.0]  DISCHARGE DIAGNOSIS:  Principal Problem:   Sepsis ([place]) Active Problems:   Urinary tract infection   Hypertension   Hypothyroidism   Chronic diastolic heart failure (H[facility])   Paroxysmal atrial fibrillation ([place])   SECONDARY DIAGNOSIS:   Past Medical History:  Diagnosis Date  . Anemia    hx of blood transfusion  . Cancer ([place])    lymphomia  . Coarse tremors    head shakes if does not take propranolol  . Coronary artery disease   . GERD (gastroesophageal reflux disease)   . History of lymphoma   . Hyperlipidemia   . Hypertension   . Insomnia    takes remeron nightly  . Other chronic cystitis    Radiation , botox injections  . Self-catheterizes urinary bladder    twice daily  . Thyroid disease    had thyroid removed     ADMITTING HISTORY  Evelyn [name]  is a 82 y.o. female who presents with malaise and dysuria.  Patient has a history of chronic recurrent UTIs. Today she presents to the ED meeting criteria for sepsis with tachycardia and elevated white blood cell count. She was started on IV antibiotics per sepsis protocol and hospitalists were called for admission. Patient's son who is with her in the ED today states that she is also been having some intermittent episodes of confusion over the same timeframe as her dysuria.  HOSPITAL COURSE:    82 year old female patient with history of recurrent UTIs came in because of  dysuria, malaise, elevated white count. Admitted for sepsis   * UTI with sepsis Multiple bacteria on cultures. Has history of ESBL in the past. Treated with IV meropenem in  the hospital and finished her course of >3 days No antibiotics at discharge.  # Paroxismal atrial fibrillation: Continue home medications  # Essential hypertension: Controlled  # Chronic diastolic heart failure: Stable.  # Hypothyroidism continue Synthroid.  Seen by physical therapy. Home health PT recommended. This is being set up.  Stable for discharge back to assisted living facility with home health PT.  CONSULTS OBTAINED:    DRUG ALLERGIES:   Allergies  Allergen Reactions  . Cephalosporins Other (See Comments)  . Macrolides And Ketolides Other (See Comments)  . Oxycodone Nausea Only  . Ciprofloxacin Swelling, Rash and Other (See Comments)  . Neomy-Bacit-Polymyx-Pramoxine Rash    "All antibiotics"  . Other Rash    "All antibiotics"  . Penicillins Swelling, Rash and Other (See Comments)    Pt is unable to answer additional questions about this medication.    . Prednisone Rash and Other (See Comments)  . Sulfa Antibiotics Swelling, Rash and Other (See Comments)    DISCHARGE MEDICATIONS:   Current Discharge Medication List    CONTINUE these medications which have NOT CHANGED   Details  apixaban (ELIQUIS) 2.5 MG TABS tablet Take 2.5 mg by mouth 2 (two) times daily.    conjugated estrogens (PREMARIN) vaginal cream Place 1 Applicatorful vaginally every Monday, Wednesday, and Friday.     Cranberry 250 MG TABS Take 1 tablet (250 mg total) by mouth 2 (two) times daily. Qty:  60 tablet, Refills: 12    levothyroxine (SYNTHROID, LEVOTHROID) 100 MCG tablet Take 100 mcg by mouth daily.    loperamide (IMODIUM A-D) 2 MG tablet Take 4 mg by mouth 4 (four) times daily as needed for diarrhea or loose stools.    mirtazapine (REMERON) 7.5 MG tablet Take 1 tablet (7.5 mg total) by mouth at bedtime. Qty: 90 tablet, Refills: 1    Multiple Vitamins-Minerals (MULTIVITAMIN WITH MINERALS) tablet Take 1 tablet by mouth daily.   Associated Diagnoses: History of lymphoma     omeprazole (PRILOSEC) 20 MG capsule Take 20 mg by mouth daily.    propranolol (INDERAL) 20 MG tablet Take 1.5 tablets (30 mg total) by mouth 2 (two) times daily. Qty: 90 tablet, Refills: 3    tolterodine (DETROL) 2 MG tablet Take 4 mg by mouth daily.    vitamin B-12 (CYANOCOBALAMIN) 1000 MCG tablet Take 1,000 mcg by mouth daily.    zolpidem (AMBIEN) 5 MG tablet Take 5 mg by mouth at bedtime as needed for sleep.    fesoterodine (TOVIAZ) 4 MG TB24 tablet Take 1 tablet (4 mg total) by mouth daily. Qty: 90 tablet, Refills: 4        Today   VITAL SIGNS:  Blood pressure 124/72, pulse (!) 103, temperature 97.9 F (36.6 C), temperature source Oral, resp. rate (!) 22, height 5\' 6"  (1.676 m), weight 59.8 kg (131 lb 12.8 oz), SpO2 95 %.  I/O:  No intake or output data in the 24 hours ending 05/09/16 1208  PHYSICAL EXAMINATION:  Physical Exam  GENERAL:  82 y.o.-year-old patient lying in the bed with no acute distress.  LUNGS: Normal breath sounds bilaterally, no wheezing, rales,rhonchi or crepitation. No use of accessory muscles of respiration.  CARDIOVASCULAR: S1, S2 normal. No murmurs, rubs, or gallops.  ABDOMEN: Soft, non-tender, non-distended. Bowel sounds present. No organomegaly or mass.  NEUROLOGIC: Moves all 4 extremities. PSYCHIATRIC: The patient is alert and awake SKIN: No obvious rash, lesion, or ulcer.   DATA REVIEW:   CBC  Recent Labs Lab 05/06/16 0402  WBC 12.1*  HGB 11.5*  HCT 34.1*  PLT 328    Chemistries   Recent Labs Lab 05/05/16 1820 05/06/16 0402  NA 136 139  K 4.4 3.8  CL 103 107  CO2 25 26  GLUCOSE 142* 122*  BUN 16 16  CREATININE 1.21* 1.06*  CALCIUM 8.9 8.2*  AST 17  --   ALT 9*  --   ALKPHOS 70  --   BILITOT 0.4  --     Cardiac Enzymes No results for input(s): TROPONINI in the last 168 hours.  Microbiology Results  Results for orders placed or performed during the hospital encounter of 05/05/16  Urine culture     Status:  Abnormal   Collection Time: 05/05/16  7:33 PM  Result Value Ref Range Status   Specimen Description URINE, CLEAN CATCH  Final   Special Requests NONE  Final   Culture MULTIPLE SPECIES PRESENT, SUGGEST RECOLLECTION (A)  Final   Report Status 05/07/2016 FINAL  Final  Blood Culture (routine x 2)     Status: None (Preliminary result)   Collection Time: 05/05/16 10:38 PM  Result Value Ref Range Status   Specimen Description BLOOD LEFT ASSIST CONTROL  Final   Special Requests BOTTLES DRAWN AEROBIC AND ANAEROBIC 5CC  Final   Culture NO GROWTH 4 DAYS  Final   Report Status PENDING  Incomplete  Blood Culture (routine x 2)  Status: None (Preliminary result)   Collection Time: 05/05/16 10:38 PM  Result Value Ref Range Status   Specimen Description BLOOD RIGHT ASSIST CONTROL  Final   Special Requests BOTTLES DRAWN AEROBIC AND ANAEROBIC 5CC  Final   Culture NO GROWTH 4 DAYS  Final   Report Status PENDING  Incomplete  MRSA PCR Screening     Status: None   Collection Time: 05/06/16  1:40 AM  Result Value Ref Range Status   MRSA by PCR NEGATIVE NEGATIVE Final    Comment:        The GeneXpert MRSA Assay (FDA approved for NASAL specimens only), is one component of a comprehensive MRSA colonization surveillance program. It is not intended to diagnose MRSA infection nor to guide or monitor treatment for MRSA infections.     RADIOLOGY:  No results found.  Follow up with PCP in 1 week.  Management plans discussed with the patient, family and they are in agreement.  CODE STATUS:     Code Status Orders        Start     Ordered   05/06/16 0102  Full code  Continuous     05/06/16 0101    Code Status History    Date Active Date Inactive Code Status Order ID Comments User Context   05/05/2015  8:48 PM 05/10/2015  5:35 PM Full Code [id number]  Henreitta Leber, MD Inpatient      TOTAL TIME TAKING CARE OF THIS PATIENT ON DAY OF DISCHARGE: more than 30 minutes.   Hillary Bow R M.D  on 05/09/2016 at 12:08 PM  Between 7am to 6pm - Pager - [phone]  After 6pm go to [email or web address] - password [email or web address] [facility]  Office  [phone]  CC: Primary care physician; Crecencio Mc, MD  Note: This dictation was prepared with Dragon dictation along with smaller phrase technology. Any transcriptional errors that result from this process are unintentional.

## 2016-05-10 ENCOUNTER — Telehealth: Payer: Self-pay | Admitting: Internal Medicine

## 2016-05-10 LAB — CULTURE, BLOOD (ROUTINE X 2)
CULTURE: NO GROWTH
Culture: NO GROWTH

## 2016-05-10 NOTE — Telephone Encounter (Signed)
[facility] called and just wanted to give an FYI that pt fell out of the bed her first day back. She did not fall hard because she was tangled up in her blankets. Thank you!

## 2016-05-11 ENCOUNTER — Telehealth: Payer: Self-pay | Admitting: *Deleted

## 2016-05-11 NOTE — Telephone Encounter (Signed)
FYI thanks.

## 2016-05-11 NOTE — Telephone Encounter (Signed)
FYI [facility] will start Home health on 05-15-16

## 2016-05-11 NOTE — Telephone Encounter (Signed)
FYI

## 2016-05-12 ENCOUNTER — Telehealth: Payer: Self-pay | Admitting: Internal Medicine

## 2016-05-12 ENCOUNTER — Ambulatory Visit (INDEPENDENT_AMBULATORY_CARE_PROVIDER_SITE_OTHER): Payer: PPO | Admitting: Internal Medicine

## 2016-05-12 ENCOUNTER — Encounter: Payer: Self-pay | Admitting: Internal Medicine

## 2016-05-12 DIAGNOSIS — R197 Diarrhea, unspecified: Secondary | ICD-10-CM

## 2016-05-12 DIAGNOSIS — N390 Urinary tract infection, site not specified: Secondary | ICD-10-CM

## 2016-05-12 DIAGNOSIS — M6281 Muscle weakness (generalized): Secondary | ICD-10-CM | POA: Diagnosis not present

## 2016-05-12 DIAGNOSIS — J069 Acute upper respiratory infection, unspecified: Secondary | ICD-10-CM

## 2016-05-12 DIAGNOSIS — Z09 Encounter for follow-up examination after completed treatment for conditions other than malignant neoplasm: Secondary | ICD-10-CM

## 2016-05-12 DIAGNOSIS — I350 Nonrheumatic aortic (valve) stenosis: Secondary | ICD-10-CM | POA: Diagnosis not present

## 2016-05-12 DIAGNOSIS — B9789 Other viral agents as the cause of diseases classified elsewhere: Secondary | ICD-10-CM

## 2016-05-12 MED ORDER — PREDNISONE 10 MG PO TABS: ORAL_TABLET | ORAL | 0 refills | Status: DC

## 2016-05-12 NOTE — Telephone Encounter (Signed)
Santiago Glad [phone] called from [facility] regarding pt Rx does not have instructions for predniSONE (DELTASONE) 10 MG tablet. Pharmacy below can take a verbal.   Pharmacy is [place], [place] [address]  Thank you!

## 2016-05-12 NOTE — Progress Notes (Signed)
Pre visit review using our clinic review tool, if applicable. No additional management support is needed unless otherwise documented below in the visit note. 

## 2016-05-12 NOTE — Progress Notes (Addendum)
Subjective:  Patient ID: Evelyn [name], female    DOB: 19-May-1920  Age: 82 y.o. MRN: [id number]  CC: Diagnoses of Complicated UTI (urinary tract infection), Generalized muscle weakness, Diarrhea, unspecified type, Viral URI with cough, Aortic stenosis, mild, and Hospital discharge follow-up were pertinent to this visit.  HPI LEDONNA BONEY presents for follow up after  a recent  Hospitalization for signs and symptoms diagnostic of sepsis  With tachycardia, leukocytosis and delirium.  Patient was hospitalized  from Oct 6 to Oct 10,  And discharged back to [facility].  She was treated for a  UTI. Blood cultures were negative and she was treated with meropenem x 3 days in house for complete therapy. .  She has not felt good since discharge , feels tired and cold,  Has been having a productive sounding cough which caretakers notes is getting worse over the last 2-3 days. She has been more fatigued. .  Denies any recent falls but has been generally weak,  Using a rolling walker with a seat and has to rest on the way to the dining hall which is less than 200 feet from her room.  appetite has been poor.No vomiting,  Some loose stool since discharge.   She is hard of hearing but Not wearing her hearing aid,  "lost it."  Can't afford to get a 3rd one (has lost 2).          Outpatient Medications Prior to Visit  Medication Sig Dispense Refill  . apixaban (ELIQUIS) 2.5 MG TABS tablet Take 2.5 mg by mouth 2 (two) times daily.    Marland Kitchen conjugated estrogens (PREMARIN) vaginal cream Place 1 Applicatorful vaginally every Monday, Wednesday, and Friday.     . Cranberry 250 MG TABS Take 1 tablet (250 mg total) by mouth 2 (two) times daily. 60 tablet 12  . fesoterodine (TOVIAZ) 4 MG TB24 tablet Take 1 tablet (4 mg total) by mouth daily. 90 tablet 4  . levothyroxine (SYNTHROID, LEVOTHROID) 100 MCG tablet Take 100 mcg by mouth daily.    Marland Kitchen loperamide (IMODIUM A-D) 2 MG tablet Take 4 mg by mouth 4 (four)  times daily as needed for diarrhea or loose stools.    . mirtazapine (REMERON) 7.5 MG tablet Take 1 tablet (7.5 mg total) by mouth at bedtime. 90 tablet 1  . Multiple Vitamins-Minerals (MULTIVITAMIN WITH MINERALS) tablet Take 1 tablet by mouth daily.    Marland Kitchen omeprazole (PRILOSEC) 20 MG capsule Take 20 mg by mouth daily.    . propranolol (INDERAL) 20 MG tablet Take 1.5 tablets (30 mg total) by mouth 2 (two) times daily. 90 tablet 3  . tolterodine (DETROL) 2 MG tablet Take 4 mg by mouth daily.    . vitamin B-12 (CYANOCOBALAMIN) 1000 MCG tablet Take 1,000 mcg by mouth daily.    Marland Kitchen zolpidem (AMBIEN) 5 MG tablet Take 5 mg by mouth at bedtime as needed for sleep.     No facility-administered medications prior to visit.     Review of Systems;  Patient denies headache, fevers, , unintentional weight loss, skin rash, eye pain, sinus congestion and sinus pain, sore throat, dysphagia,  hemoptysis , , dyspnea, wheezing, chest pain, palpitations, orthopnea, edema, abdominal pain, nausea, melena,  constipation, flank pain,  hematuria,nocturia, numbness, tingling, seizures,  Focal weakness, Loss of consciousness,  Tremor, insomnia, depression, anxiety, and suicidal ideation.      Objective:  BP 130/70   Pulse 74   Temp 98.1 F (36.7 C) (Oral)  Resp 18   Wt 131 lb 4 oz (59.5 kg)   SpO2 92%   BMI 21.18 kg/m   BP Readings from Last 3 Encounters:  05/12/16 130/70  05/09/16 124/72  03/13/16 138/71    Wt Readings from Last 3 Encounters:  05/12/16 131 lb 4 oz (59.5 kg)  05/09/16 131 lb 12.8 oz (59.8 kg)  03/13/16 132 lb (59.9 kg)    General appearance: alert, cooperative and appears stated age Ears: normal TM's and external ear canals both ears Throat: lips, mucosa, and tongue normal; teeth and gums normal Neck: no adenopathy, no carotid bruit, supple, symmetrical, trachea midline and thyroid not enlarged, symmetric, no tenderness/mass/nodules Back: symmetric, no curvature. ROM normal. No CVA  tenderness. Lungs: clear to auscultation bilaterally Heart: regular rate and rhythm, aortic stenosis murmur Abdomen: soft, non-tender; bowel sounds normal; no masses,  no organomegaly Pulses: 2+ and symmetric Skin: Skin color, texture, turgor normal. No rashes or lesions MSK: trouble rising from seated position,  Gait unsteady  Lymph nodes: Cervical, supraclavicular, and axillary nodes normal.  Lab Results  Component Value Date   HGBA1C 6.1 03/23/2014    Lab Results  Component Value Date   CREATININE 1.06 (H) 05/06/2016   CREATININE 1.21 (H) 05/05/2016   CREATININE 0.96 01/25/2016    Lab Results  Component Value Date   WBC 12.1 (H) 05/06/2016   HGB 11.5 (L) 05/06/2016   HCT 34.1 (L) 05/06/2016   PLT 328 05/06/2016   GLUCOSE 122 (H) 05/06/2016   CHOL 260 (H) 03/23/2014   TRIG 268.0 (H) 03/23/2014   HDL 43.60 03/23/2014   LDLDIRECT 210.1 03/23/2014   LDLCALC 191 (H) 12/26/2013   ALT 9 (L) 05/05/2016   AST 17 05/05/2016   NA 139 05/06/2016   K 3.8 05/06/2016   CL 107 05/06/2016   CREATININE 1.06 (H) 05/06/2016   BUN 16 05/06/2016   CO2 26 05/06/2016   TSH 0.59 01/25/2016   INR 1.00 05/05/2015   HGBA1C 6.1 03/23/2014    No results found.  Assessment & Plan:   Problem List Items Addressed This Visit    Generalized muscle weakness    She requires use of a rolling walker to ambulate but has been unable to walk  More than 50 to 75 feet without resting.  She has a history of spinal stenosis and was recently hospitalized for three days due to sepsis syndrome.  Agree with referral for home PT evaluation      Relevant Orders   Ambulatory referral to [facility]   Diarrhea    Mild, In the setting of recent antibiotic use.  Recommended use of probiotic, if no improvement,  c dificile testing       Complicated UTI (urinary tract infection)     Blood and urine cultures reviewed from recent hospitalization. Urine culture was inconclusive so she was treated with meropenem.         Viral URI with cough    URI is most likely viral given the mild HEENT Symptoms  And normal exam.   I have explained that in viral URIS, an antibiotic will not help the symptoms.  Continue oral and nasal decongestants,prn tylenol 650 mq 8 hrs for aches and pains,  Sinus flushes with Milta Deiters Med's rinse,  prednisone  taper for inflammation, and cough suppressant.         Aortic stenosis, mild    By ECHO done Oct 2016 during admission for syncope.  EF 60 to 65%.  Hospital discharge follow-up    Patient is stable post discharge but has generalized weakness, productive cough,  And loose stools since discharge. All labs , imaging studies and progress notes from admission were reviewed with patient today        Other Visit Diagnoses   None.     I have changed Ms. Runkle's predniSONE. I am also having her maintain her omeprazole, apixaban, vitamin B-12, levothyroxine, mirtazapine, multivitamin with minerals, zolpidem, loperamide, conjugated estrogens, propranolol, Cranberry, fesoterodine, and tolterodine.  Meds ordered this encounter  Medications  . DISCONTD: predniSONE (DELTASONE) 10 MG tablet    Sig: His random glucose is again elevated but not diagnostic of diabetes .  I recommend he follow a low glycemic index diet and particpate regularly in an aerobic  exercise activity.  We should check an A1c in 3 months.    Dispense:  21 tablet    Refill:  0  . predniSONE (DELTASONE) 10 MG tablet    Sig: 6 tablets all at once on Day 1,  Then taper by 1 tablet daily until gone    Dispense:  21 tablet    Refill:  0    Medications Discontinued During This Encounter  Medication Reason  . predniSONE (DELTASONE) 10 MG tablet Reorder    Follow-up: Return in about 3 months (around 08/12/2016).   Crecencio Mc, MD

## 2016-05-12 NOTE — Telephone Encounter (Signed)
Spoke with PCP and called pharmacy to clarify. thanks

## 2016-05-14 DIAGNOSIS — I350 Nonrheumatic aortic (valve) stenosis: Secondary | ICD-10-CM | POA: Insufficient documentation

## 2016-05-14 DIAGNOSIS — Z09 Encounter for follow-up examination after completed treatment for conditions other than malignant neoplasm: Secondary | ICD-10-CM | POA: Insufficient documentation

## 2016-05-14 DIAGNOSIS — J069 Acute upper respiratory infection, unspecified: Secondary | ICD-10-CM | POA: Insufficient documentation

## 2016-05-14 DIAGNOSIS — B9789 Other viral agents as the cause of diseases classified elsewhere: Secondary | ICD-10-CM

## 2016-05-14 NOTE — Assessment & Plan Note (Signed)
URI is most likely viral given the mild HEENT Symptoms  And normal exam.   I have explained that in viral URIS, an antibiotic will not help the symptoms.  Continue oral and nasal decongestants,prn tylenol 650 mq 8 hrs for aches and pains,  Sinus flushes with Milta Deiters Med's rinse,  prednisone  taper for inflammation, and cough suppressant.

## 2016-05-14 NOTE — Assessment & Plan Note (Addendum)
She requires use of a rolling walker to ambulate but has been unable to walk  More than 50 to 75 feet without resting.  She has a history of spinal stenosis and was recently hospitalized for three days due to sepsis syndrome.  Agree with referral for home PT evaluation

## 2016-05-14 NOTE — Assessment & Plan Note (Signed)
Patient is stable post discharge but has generalized weakness, productive cough,  And loose stools since discharge. All labs , imaging studies and progress notes from admission were reviewed with patient today

## 2016-05-14 NOTE — Assessment & Plan Note (Signed)
By ECHO done Oct 2016 during admission for syncope.  EF 60 to 65%.

## 2016-05-14 NOTE — Assessment & Plan Note (Addendum)
Blood and urine cultures reviewed from recent hospitalization. Urine culture was inconclusive so she was treated with meropenem.

## 2016-05-14 NOTE — Assessment & Plan Note (Signed)
Mild, In the setting of recent antibiotic use.  Recommended use of probiotic, if no improvement,  c dificile testing

## 2016-05-15 ENCOUNTER — Telehealth: Payer: Self-pay | Admitting: Internal Medicine

## 2016-05-15 DIAGNOSIS — I5032 Chronic diastolic (congestive) heart failure: Secondary | ICD-10-CM | POA: Diagnosis not present

## 2016-05-15 DIAGNOSIS — Z8572 Personal history of non-Hodgkin lymphomas: Secondary | ICD-10-CM | POA: Diagnosis not present

## 2016-05-15 DIAGNOSIS — I110 Hypertensive heart disease with heart failure: Secondary | ICD-10-CM | POA: Diagnosis not present

## 2016-05-15 DIAGNOSIS — Z87440 Personal history of urinary (tract) infections: Secondary | ICD-10-CM | POA: Diagnosis not present

## 2016-05-15 DIAGNOSIS — E039 Hypothyroidism, unspecified: Secondary | ICD-10-CM | POA: Diagnosis not present

## 2016-05-15 DIAGNOSIS — M6281 Muscle weakness (generalized): Secondary | ICD-10-CM | POA: Diagnosis not present

## 2016-05-15 DIAGNOSIS — I480 Paroxysmal atrial fibrillation: Secondary | ICD-10-CM | POA: Diagnosis not present

## 2016-05-15 DIAGNOSIS — E785 Hyperlipidemia, unspecified: Secondary | ICD-10-CM | POA: Diagnosis not present

## 2016-05-15 DIAGNOSIS — Z7901 Long term (current) use of anticoagulants: Secondary | ICD-10-CM | POA: Diagnosis not present

## 2016-05-15 MED ORDER — ESTROGENS, CONJUGATED 0.625 MG/GM VA CREA
1.0000 | TOPICAL_CREAM | VAGINAL | 3 refills | Status: DC
Start: 2016-05-15 — End: 2016-10-11

## 2016-05-15 NOTE — Telephone Encounter (Signed)
Facility states patient seems more confused and ak could it be from prednisone?

## 2016-05-15 NOTE — Telephone Encounter (Signed)
Brianna [phone] called from [facility] regarding pt may be allergic to predniSONE (DELTASONE) 10 MG tablet. Pt is confused possible side effect. Hold order for her conjugated estrogens (PREMARIN) vaginal cream because it can interfere with the prednisone. Will need another Rx in place of the prednisone. Thank you!

## 2016-05-16 ENCOUNTER — Encounter: Payer: Self-pay | Admitting: *Deleted

## 2016-05-16 NOTE — Telephone Encounter (Signed)
Verbal order given to facility and written order printed for MD approval. Order to be faxed to [phone]. FYI in quick sign for this afternoon.

## 2016-05-16 NOTE — Telephone Encounter (Signed)
There is no alternative for the prednisone.   They can stop it.  They can use Delsym for the cough  Per letter sent after her visit. Continue the premarin vaginal cream

## 2016-05-17 ENCOUNTER — Telehealth: Payer: Self-pay | Admitting: Internal Medicine

## 2016-05-17 NOTE — Telephone Encounter (Signed)
Left a VM to return my call to give verbal order, thanks

## 2016-05-17 NOTE — Telephone Encounter (Signed)
Kimberly form [facility] called and was asking for verbal orders to continue in home physical for 2x a week x 6 weeks.  Call @  [phone]

## 2016-05-22 DIAGNOSIS — I110 Hypertensive heart disease with heart failure: Secondary | ICD-10-CM | POA: Diagnosis not present

## 2016-05-22 DIAGNOSIS — E039 Hypothyroidism, unspecified: Secondary | ICD-10-CM | POA: Diagnosis not present

## 2016-05-22 DIAGNOSIS — Z8572 Personal history of non-Hodgkin lymphomas: Secondary | ICD-10-CM | POA: Diagnosis not present

## 2016-05-22 DIAGNOSIS — M6281 Muscle weakness (generalized): Secondary | ICD-10-CM | POA: Diagnosis not present

## 2016-05-22 DIAGNOSIS — E785 Hyperlipidemia, unspecified: Secondary | ICD-10-CM | POA: Diagnosis not present

## 2016-05-22 DIAGNOSIS — I480 Paroxysmal atrial fibrillation: Secondary | ICD-10-CM | POA: Diagnosis not present

## 2016-05-22 DIAGNOSIS — Z87440 Personal history of urinary (tract) infections: Secondary | ICD-10-CM | POA: Diagnosis not present

## 2016-05-22 DIAGNOSIS — Z7901 Long term (current) use of anticoagulants: Secondary | ICD-10-CM | POA: Diagnosis not present

## 2016-05-22 DIAGNOSIS — I5032 Chronic diastolic (congestive) heart failure: Secondary | ICD-10-CM | POA: Diagnosis not present

## 2016-05-23 NOTE — Telephone Encounter (Signed)
Verbal given 

## 2016-05-26 ENCOUNTER — Ambulatory Visit (INDEPENDENT_AMBULATORY_CARE_PROVIDER_SITE_OTHER): Payer: PPO | Admitting: Internal Medicine

## 2016-05-26 ENCOUNTER — Encounter: Payer: Self-pay | Admitting: Internal Medicine

## 2016-05-26 VITALS — BP 108/78 | HR 132 | Temp 98.3°F | Ht 66.0 in | Wt 131.2 lb

## 2016-05-26 DIAGNOSIS — B9789 Other viral agents as the cause of diseases classified elsewhere: Secondary | ICD-10-CM

## 2016-05-26 DIAGNOSIS — H6121 Impacted cerumen, right ear: Secondary | ICD-10-CM | POA: Diagnosis not present

## 2016-05-26 DIAGNOSIS — Z0000 Encounter for general adult medical examination without abnormal findings: Secondary | ICD-10-CM | POA: Diagnosis not present

## 2016-05-26 DIAGNOSIS — J069 Acute upper respiratory infection, unspecified: Secondary | ICD-10-CM

## 2016-05-26 DIAGNOSIS — H6123 Impacted cerumen, bilateral: Secondary | ICD-10-CM

## 2016-05-26 DIAGNOSIS — K644 Residual hemorrhoidal skin tags: Secondary | ICD-10-CM

## 2016-05-26 MED ORDER — DIBUCAINE 1 % EX OINT: TOPICAL_OINTMENT | Freq: Three times a day (TID) | CUTANEOUS | 4 refills | Status: DC | PRN

## 2016-05-26 NOTE — Progress Notes (Addendum)
Patient ID: Evelyn [name], female    DOB: [birth year]  Age: 82 y.o. MRN: [id number]  The patient is here for annual Medicare wellness examination and management of other chronic and acute problems.   The risk factors are reflected in the social history.  The roster of all physicians providing medical care to patient - is listed in the Snapshot section of the chart.  Activities of daily living:  The patient is  NOT 100% independent in all ADLs: dressing, toileting, feeding as well as independent mobility. Gave up independent living last year after admission to [facility] with sepsis due UTI. Resident of [facility]    There is no risks for hepatitis, STDs or HIV. There is no   history of blood transfusion. They have no travel history to infectious disease endemic areas of the world.  The patient has seen their dentist in the last six month. They have seen their eye doctor in the last year. She his very heard of hearing,  Has lost her 2nd pair of hearing aids.   They do not  have excessive sun exposure. Discussed the need for sun protection: hats, long sleeves and use of sunscreen if there is significant sun exposure.   Diet: the importance of a healthy diet is discussed. They do have a healthy diet.  The benefits of regular aerobic exercise were discussed. She does not exercise .  Depression screen: there are no signs or vegative symptoms of depression- irritability, change in appetite, anhedonia, sadness/tearfullness.  Cognitive assessment: the patient does not manage her financial and personal affairs and is actively engaged. They could not  relate day,date,year and events; recalled 1/3 objects at 3 minutes; performed clock-face test normally.  The following portions of the patient's history were reviewed and updated as appropriate: allergies, current medications, past family history, past medical history,  past surgical history, past social history  and problem list.  Visual acuity was not  assessed per patient preference since she has regular follow up with her ophthalmologist. Hearing and body mass index were assessed and reviewed.   During the course of the visit the patient was educated and counseled about appropriate screening and preventive services including : fall prevention , diabetes screening, nutrition counseling, colorectal cancer screening, and recommended immunizations.    CC: Diagnoses of Bilateral hearing loss due to cerumen impaction, Encounter for Medicare annual wellness exam, Inflamed external hemorrhoid, and Viral URI with cough were pertinent to this visit.   Some Memory  Loss  Doesn't remember being seen  2 weeks ago for hospital followup at which time she was  Treated for viral URI with cough.  Symptoms have resolved.     Hemorrhoid pain.  This is chronic, not severe, just aggravating  and is not escalating.  She denies bleeding.   History Evelyn [name] has a past medical history of Anemia; Cancer ([place]); Coarse tremors; Coronary artery disease; GERD (gastroesophageal reflux disease); History of lymphoma; Hyperlipidemia; Hypertension; Insomnia; Other chronic cystitis; Self-catheterizes urinary bladder; and Thyroid disease.   She has a past surgical history that includes Cardiac catheterization (2005); Abdominal hysterectomy (1970s); Eye surgery; Joint replacement (2012); Stomach surgery; Appendectomy; Back surgery; Spine surgery (2008); Lumbar laminectomy/decompression microdiscectomy (01/24/2012); and Cystoscopy (April 2013).   Her family history includes Diabetes in her mother; Heart disease in her father.She reports that she quit smoking about 45 years ago. She has never used smokeless tobacco. She reports that she does not drink alcohol or use drugs.  Outpatient Medications Prior to Visit  Medication Sig Dispense Refill  . apixaban (ELIQUIS) 2.5 MG TABS tablet Take 2.5 mg by mouth 2 (two) times daily.    Marland Kitchen conjugated estrogens (PREMARIN) vaginal cream Place 1  Applicatorful vaginally every Monday, Wednesday, and Friday. 42.5 g 3  . Cranberry 250 MG TABS Take 1 tablet (250 mg total) by mouth 2 (two) times daily. 60 tablet 12  . fesoterodine (TOVIAZ) 4 MG TB24 tablet Take 1 tablet (4 mg total) by mouth daily. 90 tablet 4  . levothyroxine (SYNTHROID, LEVOTHROID) 100 MCG tablet Take 100 mcg by mouth daily.    Marland Kitchen loperamide (IMODIUM A-D) 2 MG tablet Take 4 mg by mouth 4 (four) times daily as needed for diarrhea or loose stools.    . mirtazapine (REMERON) 7.5 MG tablet Take 1 tablet (7.5 mg total) by mouth at bedtime. 90 tablet 1  . Multiple Vitamins-Minerals (MULTIVITAMIN WITH MINERALS) tablet Take 1 tablet by mouth daily.    Marland Kitchen omeprazole (PRILOSEC) 20 MG capsule Take 20 mg by mouth daily.    . predniSONE (DELTASONE) 10 MG tablet 6 tablets all at once on Day 1,  Then taper by 1 tablet daily until gone 21 tablet 0  . propranolol (INDERAL) 20 MG tablet Take 1.5 tablets (30 mg total) by mouth 2 (two) times daily. 90 tablet 3  . tolterodine (DETROL) 2 MG tablet Take 4 mg by mouth daily.    . vitamin B-12 (CYANOCOBALAMIN) 1000 MCG tablet Take 1,000 mcg by mouth daily.    Marland Kitchen zolpidem (AMBIEN) 5 MG tablet Take 5 mg by mouth at bedtime as needed for sleep.     No facility-administered medications prior to visit.     Review of Systems   Patient denies headache, fevers, malaise, unintentional weight loss, skin rash, eye pain, sinus congestion and sinus pain, sore throat, dysphagia,  hemoptysis , cough, dyspnea, wheezing, chest pain, palpitations, orthopnea, edema, abdominal pain, nausea, melena, diarrhea, constipation, flank pain, dysuria, hematuria,numbness, tingling, seizures,  Focal weakness, Loss of consciousness,  Tremor, insomnia, depression, anxiety, and suicidal ideation.      Objective:  BP 108/78   Pulse (!) 132   Temp 98.3 F (36.8 C) (Oral)   Ht 5\' 6"  (1.676 m)   Wt 131 lb 3.2 oz (59.5 kg)   SpO2 99%   BMI 21.18 kg/m   Physical Exam    General appearance: alert, cooperative and appears stated age Ears: bilateral cerumen impaction .  Throat: lips, mucosa, and tongue normal; teeth and gums normal Neck: no adenopathy, no carotid bruit, supple, symmetrical, trachea midline and thyroid not enlarged, symmetric, no tenderness/mass/nodules Back: symmetric, kyphotic , limited ROM . No CVA tenderness. Lungs: clear to auscultation bilaterally Heart: regular rate and rhythm, S1, S2 normal, aortic stenosis murmur, no click, rub or gallop Abdomen: soft, non-tender; bowel sounds normal; no masses,  no organomegaly Pulses: 2+ and symmetric Skin: Skin color, texture, turgor normal. No rashes or lesions. Trace pitting edema.  Lymph nodes: Cervical, supraclavicular, and axillary nodes normal. \Rectal: not done.     Assessment & Plan:   Problem List Items Addressed This Visit    Bilateral hearing loss due to cerumen impaction    Patient to return  Next  week for RN visit to irrigate both ears.  Son will use Debrox gtts nightly until then       Viral URI with cough    Resolved without antibiotics      Encounter for Medicare annual wellness exam    Annual Medicare  wellness  exam was done as well as a comprehensive physical exam and management of acute and chronic conditions .  During the course of the visit the patient was educated and counseled about appropriate screening and preventive services including : fall prevention , diabetes screening, nutrition counseling, colorectal cancer screening, and recommended immunizations.  Printed recommendations for health maintenance screenings was given.       Inflamed external hemorrhoid    Trial of nupercainal.       Other Visit Diagnoses   None.     I am having Ms. Bruso start on dibucaine. I am also having her maintain her omeprazole, apixaban, vitamin B-12, levothyroxine, mirtazapine, multivitamin with minerals, zolpidem, loperamide, propranolol, Cranberry, fesoterodine,  tolterodine, predniSONE, and conjugated estrogens.  Meds ordered this encounter  Medications  . dibucaine (NUPERCAINAL) 1 % ointment    Sig: Apply topically 3 (three) times daily as needed for pain. From henmorrhoids    Dispense:  30 g    Refill:  4    There are no discontinued medications.  Follow-up: Return in about 5 years (around 05/26/2021), or late afternoon RN visit right ear irrigation , for 6 months with Brion Sossamon .   Crecencio Mc, MD

## 2016-05-26 NOTE — Patient Instructions (Addendum)
1) You have a ear wax impaction on the right:  Your son will put in some ear drops over the weekend we'll have you return to have the ear irrigated   2) You need to try to empty your  Bladder every 1-2 hours while awake   3) I am prescribing Nupercainal ointment for your hemorrhoids

## 2016-05-26 NOTE — Progress Notes (Signed)
Pre visit review using our clinic review tool, if applicable. No additional management support is needed unless otherwise documented below in the visit note. 

## 2016-05-26 NOTE — Assessment & Plan Note (Addendum)
Patient to return  Next  week for RN visit to irrigate both ears.  Son will use Debrox gtts nightly until then

## 2016-05-28 DIAGNOSIS — Z0000 Encounter for general adult medical examination without abnormal findings: Secondary | ICD-10-CM | POA: Insufficient documentation

## 2016-05-28 DIAGNOSIS — K644 Residual hemorrhoidal skin tags: Secondary | ICD-10-CM | POA: Insufficient documentation

## 2016-05-28 NOTE — Assessment & Plan Note (Signed)
Resolved without antibiotics

## 2016-05-28 NOTE — Assessment & Plan Note (Signed)

## 2016-05-28 NOTE — Assessment & Plan Note (Signed)
Trial of nupercainal.

## 2016-05-29 DIAGNOSIS — E039 Hypothyroidism, unspecified: Secondary | ICD-10-CM | POA: Diagnosis not present

## 2016-05-29 DIAGNOSIS — I480 Paroxysmal atrial fibrillation: Secondary | ICD-10-CM | POA: Diagnosis not present

## 2016-05-29 DIAGNOSIS — Z7901 Long term (current) use of anticoagulants: Secondary | ICD-10-CM | POA: Diagnosis not present

## 2016-05-29 DIAGNOSIS — I110 Hypertensive heart disease with heart failure: Secondary | ICD-10-CM | POA: Diagnosis not present

## 2016-05-29 DIAGNOSIS — Z87440 Personal history of urinary (tract) infections: Secondary | ICD-10-CM | POA: Diagnosis not present

## 2016-05-29 DIAGNOSIS — I5032 Chronic diastolic (congestive) heart failure: Secondary | ICD-10-CM | POA: Diagnosis not present

## 2016-05-29 DIAGNOSIS — M6281 Muscle weakness (generalized): Secondary | ICD-10-CM | POA: Diagnosis not present

## 2016-05-29 DIAGNOSIS — E785 Hyperlipidemia, unspecified: Secondary | ICD-10-CM | POA: Diagnosis not present

## 2016-05-29 DIAGNOSIS — Z8572 Personal history of non-Hodgkin lymphomas: Secondary | ICD-10-CM | POA: Diagnosis not present

## 2016-05-30 ENCOUNTER — Ambulatory Visit (INDEPENDENT_AMBULATORY_CARE_PROVIDER_SITE_OTHER): Payer: PPO | Admitting: Internal Medicine

## 2016-05-30 DIAGNOSIS — H6121 Impacted cerumen, right ear: Secondary | ICD-10-CM | POA: Diagnosis not present

## 2016-05-30 DIAGNOSIS — H6122 Impacted cerumen, left ear: Secondary | ICD-10-CM

## 2016-06-01 NOTE — Progress Notes (Signed)
  I have reviewed the above information and agree with above.   Jaquin Coy, MD 

## 2016-06-02 NOTE — Progress Notes (Signed)
Patient came in for a ear irrigation.  Irrigated right ear and removed moderate amount of wax.  Patient tolerated well. Thanks

## 2016-06-07 ENCOUNTER — Ambulatory Visit (INDEPENDENT_AMBULATORY_CARE_PROVIDER_SITE_OTHER): Payer: PPO | Admitting: Family Medicine

## 2016-06-07 ENCOUNTER — Telehealth: Payer: Self-pay | Admitting: *Deleted

## 2016-06-07 ENCOUNTER — Encounter: Payer: Self-pay | Admitting: Family Medicine

## 2016-06-07 ENCOUNTER — Telehealth: Payer: Self-pay | Admitting: Internal Medicine

## 2016-06-07 VITALS — BP 130/72 | HR 83 | Temp 97.7°F | Resp 14 | Wt 129.5 lb

## 2016-06-07 DIAGNOSIS — R300 Dysuria: Secondary | ICD-10-CM | POA: Diagnosis not present

## 2016-06-07 DIAGNOSIS — R32 Unspecified urinary incontinence: Secondary | ICD-10-CM

## 2016-06-07 LAB — POCT URINALYSIS DIPSTICK
BILIRUBIN UA: NEGATIVE
GLUCOSE UA: NEGATIVE
KETONES UA: NEGATIVE
Nitrite, UA: POSITIVE
Protein, UA: 100
Spec Grav, UA: 1.015
Urobilinogen, UA: 0.2
pH, UA: 5.5

## 2016-06-07 MED ORDER — FOSFOMYCIN TROMETHAMINE 3 G PO PACK: 3.0000 g | PACK | Freq: Once | ORAL | 0 refills | Status: AC

## 2016-06-07 NOTE — Telephone Encounter (Signed)
[facility] requested a appt

## 2016-06-07 NOTE — Progress Notes (Signed)
Subjective:  Patient ID: Evelyn [name], female    DOB: [birth year]  Age: 82 y.o. MRN: [id number]  CC: ? UTI  HPI:  82 year old female with a history of UTI (was recently admitted in Oct for Urosepsis) presents with concern for UTI.  UTI  Patient is a poor historian.  She endorses that she's been experiencing urinary incontinence.  She states that her "bladder hurts".  She denies pain with urination. She actually endorses difficulty urinating as opposed to frequency and urgency.  No associated fevers or chills.  No known exacerbating or relieving factors.  Her most recent culture revealed multiple bacteria.  No other complaints or issues at this time.  Social Hx   Social History   Social History  . Marital status: Widowed    Spouse name: N/A  . Number of children: N/A  . Years of education: N/A   Social History Main Topics  . Smoking status: Former Smoker    Quit date: 04/04/1971  . Smokeless tobacco: Never Used  . Alcohol use No  . Drug use: No  . Sexual activity: Not Asked   Other Topics Concern  . None   Social History Narrative  . None   Review of Systems  Constitutional: Negative.   Gastrointestinal: Negative.   Genitourinary: Positive for difficulty urinating.       Urinary incontinence.   Objective:  BP 130/72 (BP Location: Right Arm, Patient Position: Sitting, Cuff Size: Normal)   Pulse 83   Temp 97.7 F (36.5 C) (Oral)   Resp 14   Wt 129 lb 8 oz (58.7 kg)   SpO2 94%   BMI 20.90 kg/m   BP/Weight 06/07/2016 05/26/2016 [id number]  Systolic BP [id number]  Diastolic BP 72 78 70  Wt. (Lbs) 129.5 131.2 131.25  BMI 20.9 21.18 21.18    Physical Exam  Constitutional:  Chronically ill-appearing female in no acute distress.  Cardiovascular: Normal rate and regular rhythm.   3/6 systolic murmur.  Abdominal: Soft. She exhibits no distension. There is no tenderness. There is no rebound and no guarding.  Neurological: She is alert.    Psychiatric: She has a normal mood and affect.  Vitals reviewed.  Lab Results  Component Value Date   WBC 12.1 (H) 05/06/2016   HGB 11.5 (L) 05/06/2016   HCT 34.1 (L) 05/06/2016   PLT 328 05/06/2016   GLUCOSE 122 (H) 05/06/2016   CHOL 260 (H) 03/23/2014   TRIG 268.0 (H) 03/23/2014   HDL 43.60 03/23/2014   LDLDIRECT 210.1 03/23/2014   LDLCALC 191 (H) 12/26/2013   ALT 9 (L) 05/05/2016   AST 17 05/05/2016   NA 139 05/06/2016   K 3.8 05/06/2016   CL 107 05/06/2016   CREATININE 1.06 (H) 05/06/2016   BUN 16 05/06/2016   CO2 26 05/06/2016   TSH 0.59 01/25/2016   INR 1.00 05/05/2015   HGBA1C 6.1 03/23/2014    Assessment & Plan:   Problem List Items Addressed This Visit    Urinary incontinence - Primary    This is a chronic problem (per caregiver at bedside). Worsening as of late. Questionable dysuria. This is difficult to determine based on her history. Her urine was markedly positive today as expected. Given the fact that she recently was admitted for urosepsis, I am covering her with fosfomycin until culture returns. Of note, she has numerous drug allergies particular to antibiotics thus resulting in me using fosfomycin.      Relevant Medications  fosfomycin (MONUROL) 3 g PACK   Other Relevant Orders   POCT Urinalysis Dipstick (Completed)   Urine Culture      Meds ordered this encounter  Medications  . fosfomycin (MONUROL) 3 g PACK    Sig: Take 3 g by mouth once.    Dispense:  3 g    Refill:  0    Follow-up: PRN  Brandon

## 2016-06-07 NOTE — Progress Notes (Signed)
Pre visit review using our clinic review tool, if applicable. No additional management support is needed unless otherwise documented below in the visit note. 

## 2016-06-07 NOTE — Telephone Encounter (Signed)
Rx given to [employer].

## 2016-06-07 NOTE — Telephone Encounter (Signed)
Please clarify.  What medication!!!

## 2016-06-07 NOTE — Patient Instructions (Addendum)
Take the medication in 3-4 oz of water.  If worsens, go to hospital.  Take care  Dr. Lacinda Axon

## 2016-06-07 NOTE — Assessment & Plan Note (Signed)
This is a chronic problem (per caregiver at bedside). Worsening as of late. Questionable dysuria. This is difficult to determine based on her history. Her urine was markedly positive today as expected. Given the fact that she recently was admitted for urosepsis, I am covering her with fosfomycin until culture returns. Of note, she has numerous drug allergies particular to antibiotics thus resulting in me using fosfomycin.

## 2016-06-07 NOTE — Telephone Encounter (Signed)
There needs to be a ordered written on the rx pad for medication to be given.

## 2016-06-07 NOTE — Telephone Encounter (Signed)
Orders for patient to have [facility] administer medication has been faxed to their facility.

## 2016-06-07 NOTE — Telephone Encounter (Signed)
Pt aid said that there needed to be an order sent to [facility] in order for them to give RX.Marland Kitchen Please advise fax to [phone]

## 2016-06-10 LAB — URINE CULTURE

## 2016-06-12 ENCOUNTER — Ambulatory Visit (INDEPENDENT_AMBULATORY_CARE_PROVIDER_SITE_OTHER): Payer: PPO | Admitting: Urology

## 2016-06-12 ENCOUNTER — Encounter: Payer: Self-pay | Admitting: Urology

## 2016-06-12 VITALS — BP 135/85 | HR 74 | Ht 66.0 in | Wt 127.7 lb

## 2016-06-12 DIAGNOSIS — N390 Urinary tract infection, site not specified: Secondary | ICD-10-CM | POA: Diagnosis not present

## 2016-06-12 DIAGNOSIS — N952 Postmenopausal atrophic vaginitis: Secondary | ICD-10-CM

## 2016-06-12 DIAGNOSIS — N39498 Other specified urinary incontinence: Secondary | ICD-10-CM | POA: Diagnosis not present

## 2016-06-12 LAB — BLADDER SCAN AMB NON-IMAGING: Scan Result: 128

## 2016-06-12 NOTE — Progress Notes (Signed)
06/12/2016 10:44 AM   Evelyn [name] 1920/05/03 [id number]  Referring provider: Crecencio Mc, MD [place],  [id number]  Chief Complaint  Patient presents with  . Urinary Incontinence    3 month follow up  . Vaginal Atrophy    HPI: Patient is a 82 year old Caucasian female with a history of recurrent UTI's, vaginal atrophy and incontinence who presents today for a 3 month follow up.  She is not accompanied by anyone at this visit.    Background history Patient was referred to [place] by, Dr. Derrel Nip, for cystitis.  Patient states that she has had numerous urinary tract infections over the last year.  Her symptoms with a urinary tract infection consist of frequency and incontinence.  She denies dysuria, gross hematuria, suprapubic pain, back pain, abdominal pain or flank pain.  She has not had any recent fevers, chills, nausea or vomiting.  She does not have a history of nephrolithiasis, GU surgery or GU trauma.  Reviewing her records,  she has had one infection over the last year.  It was positive for Klebsiella oxytoca and sensitive to the nitrofurantoin she was given.  Prior to this, she had three UTI's in 2015.  All positive for E.coli.   She is post menopausal.  She endorses diarrhea.  She does not engage in good perineal hygiene. She does not take tub baths.  She does  have incontinence.  She is using incontinence pads. She is going through three pads daily.   She had a CT with contrast in 2015 which noted mild chronic stable prominence of the intrarenal collecting systems and ureters.  She is drinking one to two glassed of water daily.    History of recurrent UTI's Patient was educated on UTI prevention at her previous visit and shared with the patient and her caregiver that I felt the UTI's may be a colonization of her GU tract and not true infections.  Counseled on symptoms of an UTI and instructed the patient to contact the office to have a CATH UA for  culture.  Patient was positive for a highly resistant E. Coli at last visit, but she was not started on antibiotics as she had no symptoms.    She was recently hospitalized for sepsis but urine culture did not grow out a specific organism.    According to the [facility] from [facility], she is taking cranberry tablets, probiotics, and vaginal estrogen cream.  She has just completed a course of Monurol for an UTI.    Vaginal atrophy According to the [facility], the vaginal estrogen cream is being applied three nights weekly.    Incontinence Patient is continuing the Toviaz 4 mg daily.  She is wearing pads.  Her pad was dry at today's visit.  Her PVR was 128 mL.    PMH: Past Medical History:  Diagnosis Date  . Anemia    hx of blood transfusion  . Cancer ([place])    lymphomia  . Coarse tremors    head shakes if does not take propranolol  . Coronary artery disease   . GERD (gastroesophageal reflux disease)   . History of lymphoma   . Hyperlipidemia   . Hypertension   . Insomnia    takes remeron nightly  . Other chronic cystitis    Radiation , botox injections  . Self-catheterizes urinary bladder    twice daily  . Thyroid disease    had thyroid removed    Surgical History: Past  Surgical History:  Procedure Laterality Date  . ABDOMINAL HYSTERECTOMY  1970s  . APPENDECTOMY    . BACK SURGERY     lower back - bulging disk  . CARDIAC CATHETERIZATION  2005   2 cardiac stents  . CYSTOSCOPY  April 2013  . EYE SURGERY     bilateral cataracts  . JOINT REPLACEMENT  2012   right hip  . LUMBAR LAMINECTOMY/DECOMPRESSION MICRODISCECTOMY  01/24/2012   Procedure: LUMBAR LAMINECTOMY/DECOMPRESSION MICRODISCECTOMY 2 LEVELS;  Surgeon: Floyce Stakes, MD;  Location: [place] [facility];  Service: Neurosurgery;  Laterality: Right;  Right Lumbar four five, lumbar five sacral one foraminotomy   . [place] SURGERY  2008   [place]  . STOMACH SURGERY     removal all but small portion of stomach out due to ulcers    Home  Medications:    Medication List       Accurate as of 06/12/16 10:44 AM. Always use your most recent med list.          conjugated estrogens vaginal cream Commonly known as:  PREMARIN Place 1 Applicatorful vaginally every Monday, Wednesday, and Friday.   Cranberry 250 MG Tabs Take 1 tablet (250 mg total) by mouth 2 (two) times daily.   dibucaine 1 % ointment Commonly known as:  NUPERCAINAL Apply topically 3 (three) times daily as needed for pain. From henmorrhoids   diphenhydrAMINE 25 MG tablet Commonly known as:  BENADRYL Take 25 mg by mouth every 6 (six) hours as needed.   ELIQUIS 2.5 MG Tabs tablet Generic drug:  apixaban Take 2.5 mg by mouth 2 (two) times daily.   fesoterodine 4 MG Tb24 tablet Commonly known as:  TOVIAZ Take 1 tablet (4 mg total) by mouth daily.   levothyroxine 100 MCG tablet Commonly known as:  SYNTHROID, LEVOTHROID Take 100 mcg by mouth daily.   loperamide 2 MG tablet Commonly known as:  IMODIUM A-D Take 4 mg by mouth 4 (four) times daily as needed for diarrhea or loose stools.   mirtazapine 7.5 MG tablet Commonly known as:  REMERON Take 1 tablet (7.5 mg total) by mouth at bedtime.   multivitamin with minerals tablet Take 1 tablet by mouth daily.   omeprazole 20 MG capsule Commonly known as:  PRILOSEC Take 20 mg by mouth daily.   predniSONE 10 MG tablet Commonly known as:  DELTASONE 6 tablets all at once on Day 1,  Then taper by 1 tablet daily until gone   PROBIOTIC PO Take by mouth.   propranolol 20 MG tablet Commonly known as:  INDERAL Take 1.5 tablets (30 mg total) by mouth 2 (two) times daily.   tolterodine 2 MG tablet Commonly known as:  DETROL Take 4 mg by mouth daily.   vitamin B-12 1000 MCG tablet Commonly known as:  CYANOCOBALAMIN Take 1,000 mcg by mouth daily.   zolpidem 5 MG tablet Commonly known as:  AMBIEN Take 5 mg by mouth at bedtime as needed for sleep.       Allergies:  Allergies  Allergen Reactions   . Cephalosporins Other (See Comments)  . Macrolides And Ketolides Other (See Comments)  . Oxycodone Nausea Only  . Ciprofloxacin Swelling, Rash and Other (See Comments)  . Neomy-Bacit-Polymyx-Pramoxine Rash    "All antibiotics"  . Other Rash    "All antibiotics"  . Penicillins Swelling, Rash and Other (See Comments)    Pt is unable to answer additional questions about this medication.    . Prednisone Rash and Other (See  Comments)  . Sulfa Antibiotics Swelling, Rash and Other (See Comments)    Family History: Family History  Problem Relation Age of Onset  . Diabetes Mother   . Heart disease Father   . Kidney disease Neg Hx   . Bladder Cancer Neg Hx     Social History:  reports that she quit smoking about 45 years ago. She has never used smokeless tobacco. She reports that she does not drink alcohol or use drugs.  ROS: UROLOGY Frequent Urination?: Yes Hard to postpone urination?: Yes Burning/pain with urination?: No Get up at night to urinate?: Yes Leakage of urine?: Yes Urine stream starts and stops?: No Trouble starting stream?: Yes Do you have to strain to urinate?: No Blood in urine?: No Urinary tract infection?: Yes Sexually transmitted disease?: No Injury to kidneys or bladder?: No Painful intercourse?: No Weak stream?: No Currently pregnant?: No Vaginal bleeding?: No Last menstrual period?: n  Gastrointestinal Nausea?: No Vomiting?: No Indigestion/heartburn?: No Diarrhea?: No Constipation?: No  Constitutional Fever: No Night sweats?: No Weight loss?: No Fatigue?: No  Skin Skin rash/lesions?: No Itching?: No  Eyes Blurred vision?: No Double vision?: No  Ears/Nose/Throat Sore throat?: No Sinus problems?: No  Hematologic/Lymphatic Swollen glands?: No Easy bruising?: No  Cardiovascular Leg swelling?: No Chest pain?: No  Respiratory Cough?: No Shortness of breath?: No  Endocrine Excessive thirst?: No  Musculoskeletal Back pain?:  No Joint pain?: No  Neurological Headaches?: No Dizziness?: No  Psychologic Depression?: No Anxiety?: No  Physical Exam: BP 135/85   Pulse 74   Ht 5\' 6"  (1.676 m)   Wt 127 lb 11.2 oz (57.9 kg)   BMI 20.61 kg/m   Constitutional: Well nourished. Alert and oriented, No acute distress. HEENT: [place] AT, moist mucus membranes. Trachea midline, no masses. Cardiovascular: No clubbing, cyanosis, or edema. Respiratory: Normal respiratory effort, no increased work of breathing. GI: Abdomen is soft, non tender, non distended, no abdominal masses. Liver and spleen not palpable.  No hernias appreciated.  Stool sample for occult testing is not indicated.   GU: No CVA tenderness.  No bladder fullness or masses.  Atrophic external genitalia, normal pubic hair distribution, no lesions.  Normal urethral meatus, no lesions, no prolapse, no discharge.   No urethral masses, tenderness and/or tenderness. No bladder fullness, tenderness or masses. Normal vagina mucosa, good estrogen effect, no discharge, no lesions, good pelvic support, no cystocele or rectocele noted.  Cervix, uterus and ovaries are surgically absent.  Anus and perineum are without rashes or lesions.  Toilet paper is noted in the gluteal folds.   Skin: No rashes, bruises or suspicious lesions. Lymph: No cervical or inguinal adenopathy. Neurologic: Grossly intact, no focal deficits, moving all 4 extremities. Psychiatric: Normal mood and affect.  Laboratory Data: Lab Results  Component Value Date   WBC 12.1 (H) 05/06/2016   HGB 11.5 (L) 05/06/2016   HCT 34.1 (L) 05/06/2016   MCV 86.1 05/06/2016   PLT 328 05/06/2016    Lab Results  Component Value Date   CREATININE 1.06 (H) 05/06/2016     Lab Results  Component Value Date   HGBA1C 6.1 03/23/2014    Lab Results  Component Value Date   TSH 0.59 01/25/2016       Component Value Date/Time   CHOL 260 (H) 03/23/2014 1525   CHOL 282 (H) 12/26/2013 1415   HDL 43.60  03/23/2014 1525   HDL 42 12/26/2013 1415   CHOLHDL 6 03/23/2014 1525   VLDL 53.6 (H) 03/23/2014  1525   VLDL 49 (H) 12/26/2013 1415   LDLCALC 191 (H) 12/26/2013 1415    Lab Results  Component Value Date   AST 17 05/05/2016   Lab Results  Component Value Date   ALT 9 (L) 05/05/2016    Pertinent Imaging:  Results for AMORAH, TELFORD (MRN [id number]) as of 03/13/2016 10:22  Ref. Range 03/13/2016 10:07  Scan Result Unknown 133     Assessment & Plan:    1. Recurrent UTI  - Reviewed UTI prevention strategies with the patient- specifically increasing her water intake  - Asked the patient to have CATH UA and cultures when she is experiencing symptoms of a urinary tract infection  - Perineal hygiene is difficult to control as she does suffer from diarrhea and uses the restroom independently- stressed with the patient to make sure she wipes front to back after using the restroom  - Like to avoid suppressive antibiotic use due to the fact that she already suffers with diarrhea, she could be at increased risk for C. Difficile  - RTC in 3 months                                     2. Atrophic vaginitis:   Patient will continue the vaginal estrogen cream, applying it 3 nights weekly.   RTC in 3 months for exam.    3. Incontinence:   Patient feels that her Lisbeth Ply is helping.  She will continue the medication and RTC in 3 months for PVR and exam.     Return in about 3 months (around 09/12/2016) for exam.  These notes generated with voice recognition software. I apologize for typographical errors.  Zara Council, [facility] [address] [place], [place] [address] [phone]

## 2016-06-14 DIAGNOSIS — I480 Paroxysmal atrial fibrillation: Secondary | ICD-10-CM | POA: Diagnosis not present

## 2016-06-14 DIAGNOSIS — I5032 Chronic diastolic (congestive) heart failure: Secondary | ICD-10-CM | POA: Diagnosis not present

## 2016-06-14 DIAGNOSIS — Z8572 Personal history of non-Hodgkin lymphomas: Secondary | ICD-10-CM | POA: Diagnosis not present

## 2016-06-14 DIAGNOSIS — Z87440 Personal history of urinary (tract) infections: Secondary | ICD-10-CM | POA: Diagnosis not present

## 2016-06-14 DIAGNOSIS — E039 Hypothyroidism, unspecified: Secondary | ICD-10-CM | POA: Diagnosis not present

## 2016-06-14 DIAGNOSIS — M6281 Muscle weakness (generalized): Secondary | ICD-10-CM | POA: Diagnosis not present

## 2016-06-14 DIAGNOSIS — I110 Hypertensive heart disease with heart failure: Secondary | ICD-10-CM | POA: Diagnosis not present

## 2016-06-14 DIAGNOSIS — Z7901 Long term (current) use of anticoagulants: Secondary | ICD-10-CM | POA: Diagnosis not present

## 2016-06-14 DIAGNOSIS — E785 Hyperlipidemia, unspecified: Secondary | ICD-10-CM | POA: Diagnosis not present

## 2016-06-19 DIAGNOSIS — I110 Hypertensive heart disease with heart failure: Secondary | ICD-10-CM | POA: Diagnosis not present

## 2016-06-19 DIAGNOSIS — E785 Hyperlipidemia, unspecified: Secondary | ICD-10-CM | POA: Diagnosis not present

## 2016-06-19 DIAGNOSIS — Z8572 Personal history of non-Hodgkin lymphomas: Secondary | ICD-10-CM | POA: Diagnosis not present

## 2016-06-19 DIAGNOSIS — Z7901 Long term (current) use of anticoagulants: Secondary | ICD-10-CM | POA: Diagnosis not present

## 2016-06-19 DIAGNOSIS — E039 Hypothyroidism, unspecified: Secondary | ICD-10-CM | POA: Diagnosis not present

## 2016-06-19 DIAGNOSIS — I480 Paroxysmal atrial fibrillation: Secondary | ICD-10-CM | POA: Diagnosis not present

## 2016-06-19 DIAGNOSIS — M6281 Muscle weakness (generalized): Secondary | ICD-10-CM | POA: Diagnosis not present

## 2016-06-19 DIAGNOSIS — I5032 Chronic diastolic (congestive) heart failure: Secondary | ICD-10-CM | POA: Diagnosis not present

## 2016-06-19 DIAGNOSIS — Z87440 Personal history of urinary (tract) infections: Secondary | ICD-10-CM | POA: Diagnosis not present

## 2016-06-20 ENCOUNTER — Other Ambulatory Visit: Payer: Self-pay | Admitting: *Deleted

## 2016-06-20 ENCOUNTER — Other Ambulatory Visit: Payer: Self-pay | Admitting: Internal Medicine

## 2016-06-20 MED ORDER — TRIAMCINOLONE ACETONIDE 0.1 % EX CREA: 1.0000 "application " | TOPICAL_CREAM | Freq: Two times a day (BID) | CUTANEOUS | 0 refills | Status: DC

## 2016-06-21 ENCOUNTER — Telehealth: Payer: Self-pay | Admitting: *Deleted

## 2016-06-21 NOTE — Telephone Encounter (Signed)
Joelene Millin from [facility] has requested extended time for physical therapy. She requested 2 times a week for 3 weeks to work on pt's balance. Contact Joelene Millin [phone]

## 2016-06-21 NOTE — Telephone Encounter (Signed)
Verbal given 

## 2016-06-28 DIAGNOSIS — Z7901 Long term (current) use of anticoagulants: Secondary | ICD-10-CM | POA: Diagnosis not present

## 2016-06-28 DIAGNOSIS — M6281 Muscle weakness (generalized): Secondary | ICD-10-CM | POA: Diagnosis not present

## 2016-06-28 DIAGNOSIS — I480 Paroxysmal atrial fibrillation: Secondary | ICD-10-CM | POA: Diagnosis not present

## 2016-06-28 DIAGNOSIS — Z87440 Personal history of urinary (tract) infections: Secondary | ICD-10-CM | POA: Diagnosis not present

## 2016-06-28 DIAGNOSIS — Z8572 Personal history of non-Hodgkin lymphomas: Secondary | ICD-10-CM | POA: Diagnosis not present

## 2016-06-28 DIAGNOSIS — E785 Hyperlipidemia, unspecified: Secondary | ICD-10-CM | POA: Diagnosis not present

## 2016-06-28 DIAGNOSIS — E039 Hypothyroidism, unspecified: Secondary | ICD-10-CM | POA: Diagnosis not present

## 2016-06-28 DIAGNOSIS — I5032 Chronic diastolic (congestive) heart failure: Secondary | ICD-10-CM | POA: Diagnosis not present

## 2016-06-28 DIAGNOSIS — I110 Hypertensive heart disease with heart failure: Secondary | ICD-10-CM | POA: Diagnosis not present

## 2016-07-03 ENCOUNTER — Telehealth: Payer: Self-pay | Admitting: Internal Medicine

## 2016-07-03 DIAGNOSIS — Z8572 Personal history of non-Hodgkin lymphomas: Secondary | ICD-10-CM | POA: Diagnosis not present

## 2016-07-03 DIAGNOSIS — I480 Paroxysmal atrial fibrillation: Secondary | ICD-10-CM | POA: Diagnosis not present

## 2016-07-03 DIAGNOSIS — E785 Hyperlipidemia, unspecified: Secondary | ICD-10-CM | POA: Diagnosis not present

## 2016-07-03 DIAGNOSIS — I5032 Chronic diastolic (congestive) heart failure: Secondary | ICD-10-CM | POA: Diagnosis not present

## 2016-07-03 DIAGNOSIS — E039 Hypothyroidism, unspecified: Secondary | ICD-10-CM | POA: Diagnosis not present

## 2016-07-03 DIAGNOSIS — Z7901 Long term (current) use of anticoagulants: Secondary | ICD-10-CM | POA: Diagnosis not present

## 2016-07-03 DIAGNOSIS — M6281 Muscle weakness (generalized): Secondary | ICD-10-CM | POA: Diagnosis not present

## 2016-07-03 DIAGNOSIS — Z87440 Personal history of urinary (tract) infections: Secondary | ICD-10-CM | POA: Diagnosis not present

## 2016-07-03 DIAGNOSIS — I110 Hypertensive heart disease with heart failure: Secondary | ICD-10-CM | POA: Diagnosis not present

## 2016-07-03 NOTE — Telephone Encounter (Signed)
Letter in your stack to sign .  Thanks.

## 2016-07-03 NOTE — Telephone Encounter (Signed)
Lm on voicemail for Santiago Glad at [phone] to get more detail regarding symptoms

## 2016-07-03 NOTE — Telephone Encounter (Signed)
Please call and triage need to know cough productive non productive any fevr chills. How long she had symptoms ? ETC.

## 2016-07-03 NOTE — Telephone Encounter (Signed)
Please give patient robitussin DM one tablespoon every 8 hours Tylenol 650 mg eery 8 hours   You can draft a letter to this effect and I will sign

## 2016-07-03 NOTE — Telephone Encounter (Signed)
Evelyn [name] her care giver called and stated that pt has a cold symptoms with a cough and would like to know if Dr. Derrel Nip would call her in a prescription for cough medication and tylenol. Please advise, thank you!  Call Evelyn [name] @[phone]  [place], [place] [address]

## 2016-07-03 NOTE — Telephone Encounter (Signed)
Spoke with Santiago Glad patient has non productive cough , no fever since Saturday.   Would like order for cough syrup and Tylenol fax [phone].

## 2016-07-10 DIAGNOSIS — Z87440 Personal history of urinary (tract) infections: Secondary | ICD-10-CM | POA: Diagnosis not present

## 2016-07-10 DIAGNOSIS — E785 Hyperlipidemia, unspecified: Secondary | ICD-10-CM | POA: Diagnosis not present

## 2016-07-10 DIAGNOSIS — I110 Hypertensive heart disease with heart failure: Secondary | ICD-10-CM | POA: Diagnosis not present

## 2016-07-10 DIAGNOSIS — E039 Hypothyroidism, unspecified: Secondary | ICD-10-CM | POA: Diagnosis not present

## 2016-07-10 DIAGNOSIS — Z7901 Long term (current) use of anticoagulants: Secondary | ICD-10-CM | POA: Diagnosis not present

## 2016-07-10 DIAGNOSIS — I480 Paroxysmal atrial fibrillation: Secondary | ICD-10-CM | POA: Diagnosis not present

## 2016-07-10 DIAGNOSIS — I5032 Chronic diastolic (congestive) heart failure: Secondary | ICD-10-CM | POA: Diagnosis not present

## 2016-07-10 DIAGNOSIS — Z8572 Personal history of non-Hodgkin lymphomas: Secondary | ICD-10-CM | POA: Diagnosis not present

## 2016-07-10 DIAGNOSIS — M6281 Muscle weakness (generalized): Secondary | ICD-10-CM | POA: Diagnosis not present

## 2016-07-12 ENCOUNTER — Telehealth: Payer: Self-pay | Admitting: *Deleted

## 2016-07-12 NOTE — Telephone Encounter (Signed)
Spoke with Evelyn [name] at [facility] patient has been having dysuria for  X 2 days, patient rated her pain on scale of 10/10, stated that she was she was having hard time urinating felt like she couldn't urinated. I advised Braina no appointments available today would need to go to urgent care to be evaluated or Er.  She stated patient also was having cough , congestion.  They already called last week for order for OTC medications Tylenol , Robitussin .  They have faxed clarification order  over for your review in your folder .     She has contacted patients son , however she hasn't heard from him .  She will have patient evaluated at ER and let us know.

## 2016-07-12 NOTE — Telephone Encounter (Signed)
I called spoke with patients son [name], he stated that patient urinated, just recently.  Patient was coughing in the background.  He stated that she had just drunk some cranberry juice and it went down the wrong way.  He wanted to keep appointment for tomorrow afternoon.  I urged him to take patient to urgent care or ER if symptoms of UTI or patient unable to produce urine or if cough get worse or shortness of breath.

## 2016-07-12 NOTE — Telephone Encounter (Signed)
Please triage and schedule if needed.

## 2016-07-12 NOTE — Telephone Encounter (Signed)
Evelyn [name] from [facility] stated that pt feels as if her bladder is falling out.  Her symptom  : trouble with urination and bladder pain. Pt. also has congestion and cough . [facility] requested a call for treatment for pt.  Contact [facility] [phone]

## 2016-07-12 NOTE — Telephone Encounter (Signed)
Pt son called back and wanted pt to come in. I checked the sch and it is a avail appt on [facility] for tomorrow at 3pm. I did advise per note that pt is to go to Urgent care for eval family wants to wait until tomorrow and said if it gets worse through the night she will go to Urgent care. Thank you!

## 2016-07-13 ENCOUNTER — Ambulatory Visit (INDEPENDENT_AMBULATORY_CARE_PROVIDER_SITE_OTHER): Payer: PPO

## 2016-07-13 ENCOUNTER — Ambulatory Visit (INDEPENDENT_AMBULATORY_CARE_PROVIDER_SITE_OTHER): Payer: PPO | Admitting: Family

## 2016-07-13 ENCOUNTER — Encounter: Payer: Self-pay | Admitting: Family

## 2016-07-13 VITALS — BP 108/66 | HR 82 | Temp 97.6°F | Ht 66.0 in | Wt 126.4 lb

## 2016-07-13 DIAGNOSIS — R197 Diarrhea, unspecified: Secondary | ICD-10-CM

## 2016-07-13 DIAGNOSIS — R05 Cough: Secondary | ICD-10-CM

## 2016-07-13 DIAGNOSIS — R300 Dysuria: Secondary | ICD-10-CM

## 2016-07-13 DIAGNOSIS — K649 Unspecified hemorrhoids: Secondary | ICD-10-CM | POA: Diagnosis not present

## 2016-07-13 DIAGNOSIS — R059 Cough, unspecified: Secondary | ICD-10-CM

## 2016-07-13 DIAGNOSIS — C8590 Non-Hodgkin lymphoma, unspecified, unspecified site: Secondary | ICD-10-CM | POA: Diagnosis not present

## 2016-07-13 MED ORDER — HYDROCORTISONE 2.5 % RE CREA: 1.0000 "application " | TOPICAL_CREAM | Freq: Two times a day (BID) | RECTAL | 0 refills | Status: DC

## 2016-07-13 MED ORDER — HYDROCORTISONE ACETATE 25 MG RE SUPP: 25.0000 mg | Freq: Two times a day (BID) | RECTAL | 0 refills | Status: DC

## 2016-07-13 NOTE — Patient Instructions (Signed)
Trial of  Hemorrhoid cream. Use EITHER the cream OR the suppository, whatever is easiest.   Please bring urine and stool specimen back to clinic tomorrow.   Chest xray to ensure no underlying infection  Let me know if not better

## 2016-07-13 NOTE — Progress Notes (Signed)
Pre visit review using our clinic review tool, if applicable. No additional management support is needed unless otherwise documented below in the visit note. 

## 2016-07-13 NOTE — Progress Notes (Signed)
Subjective:    Patient ID: Evelyn [name], female    DOB: March 02, 1920, 82 y.o.   MRN: [id number]  CC: Evelyn [name] is a 82 y.o. female who presents today for an acute visit.    HPI: CC: Urinary frequency, urinary incontinence today. Lives in assisted living, [place]. Accompanied by daughter in law. No fever, chills, abdominal pain. Ob topiaz and vaginal estrogen.   H/o hemorrhoids. Describes anus pain. No blood in stool in toilet, anal itching. No vaginal discharge or itching. Notes chronic diarrhea.   Caregiver also notes cough and congestion for the past couple of days, unchanged. No fever, SOB.   H/o urinary sepsis     11/13 urology  For recurrent UTIs; felt colonization of the GU tract, versus true infection. Instructed to have CATH UA for culture. Prefer to avoid suppressive abx d/t chronic diarrhea 11/8 had klebsiella pneumoniae and was treated with monurol  HISTORY:  Past Medical History:  Diagnosis Date  . Anemia    hx of blood transfusion  . Cancer ([place])    lymphomia  . Coarse tremors    head shakes if does not take propranolol  . Coronary artery disease   . GERD (gastroesophageal reflux disease)   . History of lymphoma   . Hyperlipidemia   . Hypertension   . Insomnia    takes remeron nightly  . Other chronic cystitis    Radiation , botox injections  . Self-catheterizes urinary bladder    twice daily  . Thyroid disease    had thyroid removed   Past Surgical History:  Procedure Laterality Date  . ABDOMINAL HYSTERECTOMY  1970s  . APPENDECTOMY    . BACK SURGERY     lower back - bulging disk  . CARDIAC CATHETERIZATION  2005   2 cardiac stents  . CYSTOSCOPY  April 2013  . EYE SURGERY     bilateral cataracts  . JOINT REPLACEMENT  2012   right hip  . LUMBAR LAMINECTOMY/DECOMPRESSION MICRODISCECTOMY  01/24/2012   Procedure: LUMBAR LAMINECTOMY/DECOMPRESSION MICRODISCECTOMY 2 LEVELS;  Surgeon: Floyce Stakes, MD;  Location: [place] [facility];  Service:  Neurosurgery;  Laterality: Right;  Right Lumbar four five, lumbar five sacral one foraminotomy   . [place] SURGERY  2008   [place]  . STOMACH SURGERY     removal all but small portion of stomach out due to ulcers   Family History  Problem Relation Age of Onset  . Diabetes Mother   . Heart disease Father   . Kidney disease Neg Hx   . Bladder Cancer Neg Hx     Allergies: Cephalosporins; Macrolides and ketolides; Oxycodone; Ciprofloxacin; Neomy-bacit-polymyx-pramoxine; Other; Penicillins; Prednisone; and Sulfa antibiotics Current Outpatient Prescriptions on File Prior to Visit  Medication Sig Dispense Refill  . apixaban (ELIQUIS) 2.5 MG TABS tablet Take 2.5 mg by mouth 2 (two) times daily.    Marland Kitchen conjugated estrogens (PREMARIN) vaginal cream Place 1 Applicatorful vaginally every Monday, Wednesday, and Friday. 42.5 g 3  . Cranberry 250 MG TABS Take 1 tablet (250 mg total) by mouth 2 (two) times daily. 60 tablet 12  . dibucaine (NUPERCAINAL) 1 % ointment Apply topically 3 (three) times daily as needed for pain. From henmorrhoids 30 g 4  . diphenhydrAMINE (BENADRYL) 25 MG tablet Take 25 mg by mouth every 6 (six) hours as needed.    . fesoterodine (TOVIAZ) 4 MG TB24 tablet Take 1 tablet (4 mg total) by mouth daily. 90 tablet 4  . levothyroxine (SYNTHROID, LEVOTHROID)  100 MCG tablet Take 100 mcg by mouth daily.    Marland Kitchen loperamide (IMODIUM A-D) 2 MG tablet Take 4 mg by mouth 4 (four) times daily as needed for diarrhea or loose stools.    . mirtazapine (REMERON) 7.5 MG tablet Take 1 tablet (7.5 mg total) by mouth at bedtime. 90 tablet 1  . Multiple Vitamins-Minerals (MULTIVITAMIN WITH MINERALS) tablet Take 1 tablet by mouth daily.    Marland Kitchen omeprazole (PRILOSEC) 20 MG capsule Take 20 mg by mouth daily.    . predniSONE (DELTASONE) 10 MG tablet 6 tablets all at once on Day 1,  Then taper by 1 tablet daily until gone 21 tablet 0  . Probiotic Product (PROBIOTIC PO) Take by mouth.    . propranolol (INDERAL) 20 MG  tablet Take 1.5 tablets (30 mg total) by mouth 2 (two) times daily. 90 tablet 3  . tolterodine (DETROL) 2 MG tablet Take 4 mg by mouth daily.    Marland Kitchen triamcinolone cream (KENALOG) 0.1 % Apply 1 application topically 2 (two) times daily. To irritated area 45 g 0  . vitamin B-12 (CYANOCOBALAMIN) 1000 MCG tablet Take 1,000 mcg by mouth daily.    Marland Kitchen zolpidem (AMBIEN) 5 MG tablet Take 5 mg by mouth at bedtime as needed for sleep.    . [DISCONTINUED] esomeprazole (NEXIUM) 40 MG capsule Take 1 capsule (40 mg total) by mouth daily. 30 capsule 6   No current facility-administered medications on file prior to visit.     Social History  Substance Use Topics  . Smoking status: Former Smoker    Quit date: 04/04/1971  . Smokeless tobacco: Never Used  . Alcohol use No    Review of Systems  Constitutional: Negative for chills and fever.  Respiratory: Negative for cough.   Cardiovascular: Negative for chest pain and palpitations.  Gastrointestinal: Positive for diarrhea (chronic loose stools). Negative for abdominal pain, nausea and vomiting.  Genitourinary: Positive for dysuria and frequency. Negative for hematuria and vaginal discharge.      Objective:    BP 108/66   Pulse 82   Temp 97.6 F (36.4 C) (Oral)   Ht 5\' 6"  (1.676 m)   Wt 126 lb 6.4 oz (57.3 kg)   SpO2 98%   BMI 20.40 kg/m    Physical Exam  Constitutional: She appears well-developed and well-nourished.  Cardiovascular: Normal rate, regular rhythm, normal heart sounds and normal pulses.   Pulmonary/Chest: Effort normal and breath sounds normal. She has no wheezes. She has no rhonchi. She has no rales.  Abdominal: There is no CVA tenderness.  Genitourinary: Rectal exam shows external hemorrhoid.  Genitourinary Comments: Multiple, tender, skin-colored prolapsed internal hemorrhoids and skin tags noted around anus. No purple discoloration or bleeding noted. No thrombus appreciated in hemorrhoid(s).   Neurological: She is alert.    Skin: Skin is warm and dry.  Psychiatric: She has a normal mood and affect. Her speech is normal and behavior is normal. Thought content normal.  Vitals reviewed.      Assessment & Plan:  1. Dysuria Pending UA. Patient unable to void during visit and will bring back to clinic. Afebrile. No CVA tenderness. As discussed with son [name] over the phone,if patient continues to feel poor over the weekend, he may need to go to ER for further evaluation. Will follow closely.   - POCT Urinalysis Dipstick; Future - Urine culture; Future - Urinalysis, Routine w reflex microscopic; Future  2. Cough Afebrile. Reassured by no acute findings on CXR.  -  DG Chest 2 View  3. Hemorrhoids, unspecified hemorrhoid type Trial of cream and/or suppository.  - hydrocortisone (ANUSOL-HC) 2.5 % rectal cream; Place 1 application rectally 2 (two) times daily.  Dispense: 30 g; Refill: 0 - hydrocortisone (ANUSOL-HC) 25 MG suppository; Place 1 suppository (25 mg total) rectally 2 (two) times daily.  Dispense: 12 suppository; Refill: 0  4. Diarrhea, unspecified type Chronic. No abdominal pain or fever. Due to living at [facility], pending stool culture to ensure no underlying bacterial infection, specifically cdiff.  - Stool culture; Future - C. difficile GDH and Toxin A/B; Future     I am having Ms. Woodmansee maintain her omeprazole, apixaban, vitamin B-12, levothyroxine, mirtazapine, multivitamin with minerals, zolpidem, loperamide, propranolol, Cranberry, fesoterodine, tolterodine, predniSONE, conjugated estrogens, dibucaine, Probiotic Product (PROBIOTIC PO), diphenhydrAMINE, and triamcinolone cream.   No orders of the defined types were placed in this encounter.   Return precautions given.   Risks, benefits, and alternatives of the medications and treatment plan prescribed today were discussed, and patient expressed understanding.   Education regarding symptom management and diagnosis given to patient on  AVS.  Continue to follow with TULLO, Aris Everts, MD for routine health maintenance.   Daisy Lazar and I agreed with plan.   Mable Paris, FNP

## 2016-07-14 ENCOUNTER — Other Ambulatory Visit (INDEPENDENT_AMBULATORY_CARE_PROVIDER_SITE_OTHER): Payer: PPO

## 2016-07-14 ENCOUNTER — Telehealth: Payer: Self-pay | Admitting: Internal Medicine

## 2016-07-14 ENCOUNTER — Encounter: Payer: Self-pay | Admitting: Family

## 2016-07-14 DIAGNOSIS — R197 Diarrhea, unspecified: Secondary | ICD-10-CM | POA: Diagnosis not present

## 2016-07-14 NOTE — Telephone Encounter (Signed)
Spoke with son No fever Will await urine and stool culture

## 2016-07-14 NOTE — Telephone Encounter (Signed)
Pt son [name] called and stated they were able to get one stool sample done but not the second. They also stated that they have not been able to get a urine sample. Please advise, thank you!  Call pt @ [phone]

## 2016-07-15 LAB — C. DIFFICILE GDH AND TOXIN A/B
C. DIFF TOXIN A/B: NOT DETECTED
C. difficile GDH: NOT DETECTED

## 2016-07-17 ENCOUNTER — Telehealth: Payer: Self-pay | Admitting: Family

## 2016-07-17 ENCOUNTER — Other Ambulatory Visit (INDEPENDENT_AMBULATORY_CARE_PROVIDER_SITE_OTHER): Payer: PPO

## 2016-07-17 DIAGNOSIS — N3001 Acute cystitis with hematuria: Secondary | ICD-10-CM

## 2016-07-17 DIAGNOSIS — R300 Dysuria: Secondary | ICD-10-CM

## 2016-07-17 LAB — POCT URINALYSIS DIPSTICK
Bilirubin, UA: NEGATIVE
GLUCOSE UA: NEGATIVE
Ketones, UA: NEGATIVE
NITRITE UA: NEGATIVE
Spec Grav, UA: <=1.005
UROBILINOGEN UA: 0.2
pH, UA: 5

## 2016-07-17 MED ORDER — FOSFOMYCIN TROMETHAMINE 3 G PO PACK: 3.0000 g | PACK | Freq: Once | ORAL | 0 refills | Status: AC

## 2016-07-17 NOTE — Telephone Encounter (Signed)
Left message to discuss urine

## 2016-07-17 NOTE — Telephone Encounter (Signed)
Cyril Mourning, you are amazing. That you so much for taking this call as I know it was very late. You are so very appreciated.

## 2016-07-17 NOTE — Telephone Encounter (Signed)
Please call son again in the morning-  How is mother feeling?  Has he called urology as we discussed?   Left message with son, [name] regarding calling back for results.   UA that appears as patient has UTI. Will cover empirically due to h/o urosepsis. Extensive drug allergies. Appears symptoms resolved when treated with monurol last UTI ( grew klebsiella pneumonia).  Urine disposed of, unable to get culture. She will need to come back .Marland Kitchen   Also tried patient and no vm to leave message.

## 2016-07-17 NOTE — Telephone Encounter (Signed)
Order faxed to [facility] 

## 2016-07-17 NOTE — Telephone Encounter (Signed)
Spoke with patients son .  States mother is feeling better.  Advised that she did have a UTI you were calling in antibiotic.   Advised that he will need to come in and get a new specimen container to collect urine for culture.  He hasn't contact urology in regards to referral due to she had no problems urinating.   Need to fax script for Monourol to [facility].

## 2016-07-17 NOTE — Addendum Note (Signed)
Addended by: Leeanne Rio on: 07/17/2016 03:35 PM   Modules accepted: Orders

## 2016-07-18 ENCOUNTER — Other Ambulatory Visit (INDEPENDENT_AMBULATORY_CARE_PROVIDER_SITE_OTHER): Payer: PPO

## 2016-07-18 DIAGNOSIS — N3001 Acute cystitis with hematuria: Secondary | ICD-10-CM

## 2016-07-18 LAB — URINALYSIS, ROUTINE W REFLEX MICROSCOPIC
Bilirubin Urine: NEGATIVE
GLUCOSE, UA: NEGATIVE
Ketones, ur: NEGATIVE
NITRITE: NEGATIVE
Specific Gravity, Urine: 1.01 (ref 1.001–1.035)
pH: 5.5 (ref 5.0–8.0)

## 2016-07-18 LAB — URINE CULTURE

## 2016-07-18 LAB — URINALYSIS, MICROSCOPIC ONLY
CASTS: NONE SEEN [LPF]
Crystals: NONE SEEN [HPF]
Squamous Epithelial / LPF: NONE SEEN [HPF] (ref <=5)
YEAST: NONE SEEN [HPF]

## 2016-07-18 LAB — STOOL CULTURE

## 2016-07-21 ENCOUNTER — Telehealth: Payer: Self-pay | Admitting: Family

## 2016-07-21 DIAGNOSIS — N3000 Acute cystitis without hematuria: Secondary | ICD-10-CM

## 2016-07-21 LAB — URINE CULTURE

## 2016-07-21 MED ORDER — AMOXICILLIN-POT CLAVULANATE ER 1000-62.5 MG PO TB12: 2.0000 | ORAL_TABLET | Freq: Two times a day (BID) | ORAL | 0 refills | Status: DC

## 2016-07-21 NOTE — Telephone Encounter (Signed)
Left message with son, [name]  If he calls back-  I have sent over [facility] for mother's UTI; we will also treat with prednisone AND benadryl since mother is HIGHLY allergic to antibiotics. Please ensure [facility] stays vigilant with signs of allergic reaction AND signs of worsening infection - confusion, dysuria, fever.   Treat with augmentin Treat with prednisone, benadryl with every dose of antibiotic

## 2016-07-25 NOTE — Telephone Encounter (Signed)
Please call Evelyn [name] and ensure  Mother started meds and is doing well.

## 2016-07-25 NOTE — Telephone Encounter (Signed)
Left message for patients son to return call back.

## 2016-07-26 ENCOUNTER — Telehealth: Payer: Self-pay | Admitting: Internal Medicine

## 2016-07-26 ENCOUNTER — Telehealth: Payer: Self-pay | Admitting: *Deleted

## 2016-07-26 NOTE — Telephone Encounter (Signed)
Spoke with raquel - abx ordered now. Directions explicit to take prednisone and benadryl WITH antibiotic due to h/o allergic reaction   Trying to call karen several times and no answer.

## 2016-07-26 NOTE — Telephone Encounter (Signed)
Called to speak with Evelyn [name] she is in a meeting advised her to give me a call ASAP.

## 2016-07-26 NOTE — Telephone Encounter (Signed)
Santiago Glad has requested a return call in reference to Augmentin  Santiago Glad resident care cord. [phone]

## 2016-07-26 NOTE — Telephone Encounter (Signed)
SPOKE with Evelyn [name] at length about directions of medications and signs/symptoms to observe for Evelyn [name]. Evelyn [name] has no fever or confusion.  Advised to call me directly if any questions/concerns

## 2016-07-26 NOTE — Telephone Encounter (Signed)
[facility] Scientist, water quality) has requested a call with clarity on the prednisone  Contact [phone]

## 2016-07-26 NOTE — Telephone Encounter (Signed)
Facility needs written order of medications to be given , they cannot process verbal orders Due to [employer] is responsible for administration. The benadryl was previously ordered at Bedtime to help patient sleep if she takes the bedtime dose and the dose ordered with the ABX prescribed patient will be giving 12.5 mg with a 25 mg dose. So they need an order to hold bedtime dose until ABX is completed.  Orders have to be explicit.

## 2016-07-26 NOTE — Telephone Encounter (Signed)
Aurora Mask from [facility] called and stated that she cannot use any of the recent orders for medication for pt. The prednisone that was prescribed pt had an allergic reaction (was loopy), the benadryl that was prescribed pt is already taking at night, and the Augmentin that was prescribed is not covered by the insurance. Is there something else that we can prescribe? She also needs a DC order on all of these medications as well. Please advise, thank you!  Call Santiago Glad @ [phone]

## 2016-07-27 ENCOUNTER — Telehealth: Payer: Self-pay | Admitting: Family

## 2016-07-27 DIAGNOSIS — N3001 Acute cystitis with hematuria: Secondary | ICD-10-CM

## 2016-07-27 MED ORDER — PREDNISONE 10 MG PO TABS: ORAL_TABLET | ORAL | 0 refills | Status: DC

## 2016-07-27 NOTE — Telephone Encounter (Signed)
Called facility patient has all medications.

## 2016-07-27 NOTE — Telephone Encounter (Signed)
Spoke with Santiago Glad today all medications are being given as directed.

## 2016-07-29 NOTE — Telephone Encounter (Signed)
close

## 2016-08-03 ENCOUNTER — Telehealth: Payer: Self-pay | Admitting: Internal Medicine

## 2016-08-03 NOTE — Telephone Encounter (Signed)
Left message for patient to return call back.  

## 2016-08-03 NOTE — Telephone Encounter (Signed)
Please call son-   How many bouts of diarrhea?   Hold Imodium.   Encourage probiotics  as found in yogurt with live cultures.   She had been having diarrhea prior to antibiotic. Is this more than Normal?   Bloody? [place] Shark? Abdominal pain or fever ?   Can they leave stool specimen to ensure no infection?  How are urinary symptoms?  Encourage frequent small sips or water. If she start s to feel dizzy or complains her mouth is dry, she may be very dehydrated and need to go to ED for fluids.   Please have them call tomorrow and let us know how she is doing as well.

## 2016-08-03 NOTE — Telephone Encounter (Signed)
Pt son called and stated that Evelyn [name] had given pt amoxicillin-clavulanate (AUGMENTIN XR) 1000-62.5 MG 12 hr tablet and that she was having diarrhea.Pt finished medication yesterday and she is still having diarrhea. [facility] has started giving her Imodium, they are concerned that she is becoming dehydrated. Please advise, thank you!  Call Danny @ [phone]

## 2016-08-04 NOTE — Telephone Encounter (Signed)
How many bouts of diarrhea?  Everyday/every episode  Instructed patient to hold Imodium.   Instructed patient to have probiotics.   She had been having diarrhea prior to antibiotic.   Bloody? Only once Owens Shark? no Abdominal pain or fever ? No pain or having fevers  Can they leave stool specimen to ensure no infection?  She will discuss with son  How are urinary symptoms? She does not have an idea.  she does feel dizzy, dry mouth, patient stated she is not going to the ED.

## 2016-08-06 NOTE — Telephone Encounter (Signed)
Thanks for getting more info  Suspect diarrhea likely from recent antibiotics - augmentin in particular can cause diarrhea.   Call to check on pt-  How is she doing today??  Any improvement ?   I have ordered stool cards which son may pick up since she mentioned a bloody episode. Have her hemorrhoids improved?   If diarrhea persists or worsens, please advise OV  Reassured no cdiff in stool cultures couple of weeks ago

## 2016-08-08 NOTE — Telephone Encounter (Signed)
Talked with Evelyn [name] patient [name] whom Stated he call ed over a week ago and heard from no one in this office since he called , patient has an appointment with PCP on Monday, advised [name] that maybe someone had just spoken with nurse at [name], [place] said he has spoken to nurse and she also has not been contacted. Patient Diarrhea has subsided and son knew nothing of picking up stool cards. Per patient son. FYI , patient is feeling better at this time.

## 2016-08-08 NOTE — Telephone Encounter (Signed)
Not sure where the miscommunication was but Fransisco Beau spoke with patient and I guess son ( see notes) wasn't informed. My apologies for that. There are other calls out to son and as well as to nursing facility per chart.   At this point, he understands to pick up stool cards , correct Juliann Pulse?  So happy to hear diarrhea has resolved and she has f/u with Pcp.  Thank you Juliann Pulse for clarifying for the son.

## 2016-08-09 NOTE — Telephone Encounter (Signed)
Noted! Thank you

## 2016-08-09 NOTE — Telephone Encounter (Signed)
Patient  Son is clear about the stool cards, and patient has an appointment Monday with Dr. Derrel Nip.

## 2016-08-14 ENCOUNTER — Telehealth: Payer: Self-pay | Admitting: *Deleted

## 2016-08-14 ENCOUNTER — Ambulatory Visit (INDEPENDENT_AMBULATORY_CARE_PROVIDER_SITE_OTHER): Payer: PPO | Admitting: Internal Medicine

## 2016-08-14 ENCOUNTER — Encounter: Payer: Self-pay | Admitting: Internal Medicine

## 2016-08-14 DIAGNOSIS — L89151 Pressure ulcer of sacral region, stage 1: Secondary | ICD-10-CM | POA: Diagnosis not present

## 2016-08-14 DIAGNOSIS — R197 Diarrhea, unspecified: Secondary | ICD-10-CM

## 2016-08-14 DIAGNOSIS — L22 Diaper dermatitis: Secondary | ICD-10-CM | POA: Diagnosis not present

## 2016-08-14 DIAGNOSIS — J069 Acute upper respiratory infection, unspecified: Secondary | ICD-10-CM

## 2016-08-14 DIAGNOSIS — B9789 Other viral agents as the cause of diseases classified elsewhere: Secondary | ICD-10-CM

## 2016-08-14 DIAGNOSIS — K644 Residual hemorrhoidal skin tags: Secondary | ICD-10-CM | POA: Diagnosis not present

## 2016-08-14 NOTE — Telephone Encounter (Signed)
If she has diarrhea, It needs to be applied after she is cleaned off.  So I cannot specify how many times daily   Order should say reapply after each loose stool./

## 2016-08-14 NOTE — Progress Notes (Signed)
Pre-visit discussion using our clinic review tool. No additional management support is needed unless otherwise documented below in the visit note.  

## 2016-08-14 NOTE — Telephone Encounter (Signed)
Please advise for patient discharge instructions

## 2016-08-14 NOTE — Telephone Encounter (Signed)
Lmom for Santiago Glad to call back

## 2016-08-14 NOTE — Telephone Encounter (Signed)
Evelyn Glad from [facility] stated that pt was advised to soak in epsom salt, however she has no excess to a tub. Pt was to have barrier cream applied once or twice a day,however the order needs to read either once a day or twice a day.  Contact  Evelyn [name] at [facility] [phone]

## 2016-08-14 NOTE — Assessment & Plan Note (Addendum)
Secondary to recent use of antibiotics,  c dif negative,  diarrhea improving.

## 2016-08-14 NOTE — Progress Notes (Signed)
Subjective:  Patient ID: Evelyn [name], female    DOB: 21-May-1920  Age: 83 y.o. MRN: [id number]  CC: Diagnoses of Decubitus ulcer of sacral region, stage 1, Diarrhea, unspecified type, Inflamed external hemorrhoid, Diaper dermatitis, and Viral URI with cough were pertinent to this visit.  HPI Evelyn [name] presents for evaluation of 1) cough and 2) follow up on recurrent UTI  Complicated by multiple drug allergies,  Incontinence, and  diarrhea which had apparently resolved by last weeks messages   Cough has been present for about a week,  Denies fevers,  Body aches.  Not short of breath "I have a terrible rectum" seeing some blood when she wipes   Outpatient Medications Prior to Visit  Medication Sig Dispense Refill  . amoxicillin-clavulanate (AUGMENTIN XR) 1000-62.5 MG 12 hr tablet Take 2 tablets by mouth 2 (two) times daily. 14 tablet 0  . apixaban (ELIQUIS) 2.5 MG TABS tablet Take 2.5 mg by mouth 2 (two) times daily.    Marland Kitchen conjugated estrogens (PREMARIN) vaginal cream Place 1 Applicatorful vaginally every Monday, Wednesday, and Friday. 42.5 g 3  . Cranberry 250 MG TABS Take 1 tablet (250 mg total) by mouth 2 (two) times daily. 60 tablet 12  . dibucaine (NUPERCAINAL) 1 % ointment Apply topically 3 (three) times daily as needed for pain. From henmorrhoids 30 g 4  . fesoterodine (TOVIAZ) 4 MG TB24 tablet Take 1 tablet (4 mg total) by mouth daily. 90 tablet 4  . hydrocortisone (ANUSOL-HC) 2.5 % rectal cream Place 1 application rectally 2 (two) times daily. 30 g 0  . hydrocortisone (ANUSOL-HC) 25 MG suppository Place 1 suppository (25 mg total) rectally 2 (two) times daily. 12 suppository 0  . levothyroxine (SYNTHROID, LEVOTHROID) 100 MCG tablet Take 100 mcg by mouth daily.    Marland Kitchen loperamide (IMODIUM A-D) 2 MG tablet Take 4 mg by mouth 4 (four) times daily as needed for diarrhea or loose stools.    . mirtazapine (REMERON) 7.5 MG tablet Take 1 tablet (7.5 mg total) by mouth at bedtime. 90  tablet 1  . Multiple Vitamins-Minerals (MULTIVITAMIN WITH MINERALS) tablet Take 1 tablet by mouth daily.    Marland Kitchen omeprazole (PRILOSEC) 20 MG capsule Take 20 mg by mouth daily.    . predniSONE (DELTASONE) 10 MG tablet Take 4 tablets ( total 40 mg) by mouth for 2 days; take 3 tablets ( total 30 mg) by mouth for 2 days; take 2 tablets ( total 20 mg) by mouth for 1 day; take 1 tablet ( total 10 mg) by mouth for 1 day. 17 tablet 0  . Probiotic Product (PROBIOTIC PO) Take by mouth.    . propranolol (INDERAL) 20 MG tablet Take 1.5 tablets (30 mg total) by mouth 2 (two) times daily. 90 tablet 3  . tolterodine (DETROL) 2 MG tablet Take 4 mg by mouth daily.    Marland Kitchen triamcinolone cream (KENALOG) 0.1 % Apply 1 application topically 2 (two) times daily. To irritated area 45 g 0  . vitamin B-12 (CYANOCOBALAMIN) 1000 MCG tablet Take 1,000 mcg by mouth daily.    Marland Kitchen zolpidem (AMBIEN) 5 MG tablet Take 5 mg by mouth at bedtime as needed for sleep.     No facility-administered medications prior to visit.     Review of Systems;  Patient denies headache, fevers, malaise, unintentional weight loss, skin rash, eye pain, sinus congestion and sinus pain, sore throat, dysphagia,  hemoptysis , dyspnea, wheezing, chest pain, palpitations, orthopnea, edema, abdominal pain, nausea, melena,  constipation, flank pain,hematuria,  numbness, tingling, seizures,  Focal weakness, Loss of consciousness,  Tremor, insomnia, depression, anxiety, and suicidal ideation.      Objective:  BP 110/70   Pulse (!) 153   Temp 97.9 F (36.6 C) (Oral)   Resp 16   Ht 5\' 6"  (1.676 m)   Wt 127 lb 8 oz (57.8 kg)   SpO2 91%   BMI 20.58 kg/m   BP Readings from Last 3 Encounters:  08/14/16 110/70  07/13/16 108/66  06/12/16 135/85    Wt Readings from Last 3 Encounters:  08/14/16 127 lb 8 oz (57.8 kg)  07/13/16 126 lb 6.4 oz (57.3 kg)  06/12/16 127 lb 11.2 oz (57.9 kg)    General appearance: alert, cooperative and appears stated age Ears:  normal TM's and external ear canals both ears Throat: lips, mucosa, and tongue normal; teeth and gums normal Neck: no adenopathy, no carotid bruit, supple, symmetrical, trachea midline and thyroid not enlarged, symmetric, no tenderness/mass/nodules Back: symmetric, no curvature. ROM normal. No CVA tenderness. Lungs: clear to auscultation bilaterally Heart: regular rate and rhythm, S1, S2 normal, no murmur, click, rub or gallop Abdomen: soft, non-tender; bowel sounds normal; no masses,  no organomegaly Pulses: 2+ and symmetric Skin: perirectal area diffusely red,  No skin breakdown, Rectal: irritated swollen hemorrhoid, non thrombosed Lymph nodes: Cervical, supraclavicular, and axillary nodes normal.  Lab Results  Component Value Date   HGBA1C 6.1 03/23/2014    Lab Results  Component Value Date   CREATININE 1.06 (H) 05/06/2016   CREATININE 1.21 (H) 05/05/2016   CREATININE 0.96 01/25/2016    Lab Results  Component Value Date   WBC 12.1 (H) 05/06/2016   HGB 11.5 (L) 05/06/2016   HCT 34.1 (L) 05/06/2016   PLT 328 05/06/2016   GLUCOSE 122 (H) 05/06/2016   CHOL 260 (H) 03/23/2014   TRIG 268.0 (H) 03/23/2014   HDL 43.60 03/23/2014   LDLDIRECT 210.1 03/23/2014   LDLCALC 191 (H) 12/26/2013   ALT 9 (L) 05/05/2016   AST 17 05/05/2016   NA 139 05/06/2016   K 3.8 05/06/2016   CL 107 05/06/2016   CREATININE 1.06 (H) 05/06/2016   BUN 16 05/06/2016   CO2 26 05/06/2016   TSH 0.59 01/25/2016   INR 1.00 05/05/2015   HGBA1C 6.1 03/23/2014    No results found.  Assessment & Plan:   Problem List Items Addressed This Visit    Decubitus ulcer of sacral region, stage 1    Secondary to irritation from diarrhea.  Dermacloud,       Diaper dermatitis    She is at risk for stage 1 decubitus ulcer  Dermacloud barrier cream ordered ,  Apply twice daily      Diarrhea    Secondary to recent use of antibiotics,  c dif negative,  diarrhea improving.       Inflamed external hemorrhoid     anusol hc bid x 1 week and prn .  Sitz baths ordered but facility does not offer tub bathing so ordered for epsom salts has been discontinued.       Viral URI with cough    Supportive care advised and outlined.         A total of 25 minutes of face to face time was spent with patient more than half of which was spent in counselling about the above mentioned conditions  and coordination of care  I am having Ms. Wille maintain her omeprazole, apixaban, vitamin B-12, levothyroxine,  mirtazapine, multivitamin with minerals, zolpidem, loperamide, propranolol, Cranberry, fesoterodine, tolterodine, conjugated estrogens, dibucaine, Probiotic Product (PROBIOTIC PO), triamcinolone cream, hydrocortisone, hydrocortisone, amoxicillin-clavulanate, and predniSONE.  No orders of the defined types were placed in this encounter.   There are no discontinued medications.  Follow-up: No Follow-up on file.   Crecencio Mc, MD

## 2016-08-14 NOTE — Assessment & Plan Note (Signed)
Secondary to irritation from diarrhea.  Dermacloud,

## 2016-08-15 NOTE — Telephone Encounter (Signed)
Santiago Glad is requesting DC epsom salt order due to patient not having a bath tube to use it for. Also wants to use the cream BID. Please advise. Order sheet in folder waiting for you to sign off.

## 2016-08-16 DIAGNOSIS — L22 Diaper dermatitis: Secondary | ICD-10-CM | POA: Insufficient documentation

## 2016-08-16 NOTE — Assessment & Plan Note (Signed)
She is at risk for stage 1 decubitus ulcer  Dermacloud barrier cream ordered ,  Apply twice daily

## 2016-08-16 NOTE — Assessment & Plan Note (Signed)
anusol hc bid x 1 week and prn .  Sitz baths ordered but facility does not offer tub bathing so ordered for epsom salts has been discontinued.

## 2016-08-16 NOTE — Assessment & Plan Note (Signed)
Supportive care advised and outlined.

## 2016-09-13 ENCOUNTER — Encounter: Payer: Self-pay | Admitting: Urology

## 2016-09-13 ENCOUNTER — Ambulatory Visit (INDEPENDENT_AMBULATORY_CARE_PROVIDER_SITE_OTHER): Payer: PPO | Admitting: Urology

## 2016-09-13 VITALS — BP 120/69 | HR 76 | Ht 66.0 in | Wt 124.3 lb

## 2016-09-13 DIAGNOSIS — N952 Postmenopausal atrophic vaginitis: Secondary | ICD-10-CM

## 2016-09-13 DIAGNOSIS — N390 Urinary tract infection, site not specified: Secondary | ICD-10-CM

## 2016-09-13 DIAGNOSIS — R32 Unspecified urinary incontinence: Secondary | ICD-10-CM | POA: Diagnosis not present

## 2016-09-13 LAB — BLADDER SCAN AMB NON-IMAGING: SCAN RESULT: 210

## 2016-09-13 NOTE — Progress Notes (Signed)
09/13/2016 11:00 AM   Evelyn [name] [birth year] [id number]  Referring provider: Crecencio Mc, MD [address] [place], [place] [address]  Chief Complaint  Patient presents with  . Urinary Incontinence    3 month follow up  . Vaginal Atrophy    HPI: Patient is a 83 year old Caucasian female with dementia and very hard of hearing making her a poor historian with a history of recurrent UTI's, vaginal atrophy and incontinence who presents today for a 3 month follow up.  She is with her driver, Evelyn [name].    Recurrent UTI's Patient with > 3 documented UTI's over the last year.  Risk factors for UTI's are dementia, incontinence, chronic diarrhea and vaginal atrophy.    Vaginal atrophy According to the [facility], the vaginal estrogen cream is being applied three nights weekly.    Incontinence Patient is continuing the Detrol daily.  She is wearing pads.  Her pad was dry at today's visit.  Her PVR was 210 mL.  She is complaining frequency x 3, urgency x 3, incontinence x 8 and nocturia x 3.  She denies any gross hematuria, suprapubic pain and dysuria.  She has not had any fevers, chills, nausea or vomiting.    She is complaining of hemorrhoid pain at today's visit.    PMH: Past Medical History:  Diagnosis Date  . Anemia    hx of blood transfusion  . Cancer ([place])    lymphomia  . Coarse tremors    head shakes if does not take propranolol  . Coronary artery disease   . GERD (gastroesophageal reflux disease)   . History of lymphoma   . Hyperlipidemia   . Hypertension   . Insomnia    takes remeron nightly  . Other chronic cystitis    Radiation , botox injections  . Self-catheterizes urinary bladder    twice daily  . Thyroid disease    had thyroid removed    Surgical History: Past Surgical History:  Procedure Laterality Date  . ABDOMINAL HYSTERECTOMY  1970s  . APPENDECTOMY    . BACK SURGERY     lower back - bulging disk  . CARDIAC CATHETERIZATION  2005   2  cardiac stents  . CYSTOSCOPY  April 2013  . EYE SURGERY     bilateral cataracts  . JOINT REPLACEMENT  2012   right hip  . LUMBAR LAMINECTOMY/DECOMPRESSION MICRODISCECTOMY  01/24/2012   Procedure: LUMBAR LAMINECTOMY/DECOMPRESSION MICRODISCECTOMY 2 LEVELS;  Surgeon: Floyce Stakes, MD;  Location: [facility];  Service: Neurosurgery;  Laterality: Right;  Right Lumbar four five, lumbar five sacral one foraminotomy   . [place] SURGERY  2008   [place]  . STOMACH SURGERY     removal all but small portion of stomach out due to ulcers    Home Medications:  Allergies as of 09/13/2016      Reactions   Cephalosporins Other (See Comments)   Macrolides And Ketolides Other (See Comments)   Oxycodone Nausea Only   Ciprofloxacin Swelling, Rash, Other (See Comments)   Neomy-bacit-polymyx-pramoxine Rash   "All antibiotics"   Other Rash   "All antibiotics"   Penicillins Swelling, Rash, Other (See Comments)   Pt is unable to answer additional questions about this medication.     Prednisone Rash, Other (See Comments)   Sulfa Antibiotics Swelling, Rash, Other (See Comments)      Medication List       Accurate as of 09/13/16 11:00 AM. Always use your most recent med  list.          amoxicillin-clavulanate 1000-62.5 MG 12 hr tablet Commonly known as:  AUGMENTIN XR Take 2 tablets by mouth 2 (two) times daily.   conjugated estrogens vaginal cream Commonly known as:  PREMARIN Place 1 Applicatorful vaginally every Monday, Wednesday, and Friday.   Cranberry 250 MG Tabs Take 1 tablet (250 mg total) by mouth 2 (two) times daily.   dibucaine 1 % ointment Commonly known as:  NUPERCAINAL Apply topically 3 (three) times daily as needed for pain. From henmorrhoids   ELIQUIS 2.5 MG Tabs tablet Generic drug:  apixaban Take 2.5 mg by mouth 2 (two) times daily.   fesoterodine 4 MG Tb24 tablet Commonly known as:  TOVIAZ Take 1 tablet (4 mg total) by mouth daily.   hydrocortisone 2.5 % rectal  cream Commonly known as:  ANUSOL-HC Place 1 application rectally 2 (two) times daily.   hydrocortisone 25 MG suppository Commonly known as:  ANUSOL-HC Place 1 suppository (25 mg total) rectally 2 (two) times daily.   levothyroxine 100 MCG tablet Commonly known as:  SYNTHROID, LEVOTHROID Take 100 mcg by mouth daily.   loperamide 2 MG tablet Commonly known as:  IMODIUM A-D Take 4 mg by mouth 4 (four) times daily as needed for diarrhea or loose stools.   mirtazapine 7.5 MG tablet Commonly known as:  REMERON Take 1 tablet (7.5 mg total) by mouth at bedtime.   multivitamin with minerals tablet Take 1 tablet by mouth daily.   omeprazole 20 MG capsule Commonly known as:  PRILOSEC Take 20 mg by mouth daily.   predniSONE 10 MG tablet Commonly known as:  DELTASONE Take 4 tablets ( total 40 mg) by mouth for 2 days; take 3 tablets ( total 30 mg) by mouth for 2 days; take 2 tablets ( total 20 mg) by mouth for 1 day; take 1 tablet ( total 10 mg) by mouth for 1 day.   PROBIOTIC PO Take by mouth.   propranolol 20 MG tablet Commonly known as:  INDERAL Take 1.5 tablets (30 mg total) by mouth 2 (two) times daily.   tolterodine 2 MG tablet Commonly known as:  DETROL Take 4 mg by mouth daily.   triamcinolone cream 0.1 % Commonly known as:  KENALOG Apply 1 application topically 2 (two) times daily. To irritated area   vitamin B-12 1000 MCG tablet Commonly known as:  CYANOCOBALAMIN Take 1,000 mcg by mouth daily.   zolpidem 5 MG tablet Commonly known as:  AMBIEN Take 5 mg by mouth at bedtime as needed for sleep.       Allergies:  Allergies  Allergen Reactions  . Cephalosporins Other (See Comments)  . Macrolides And Ketolides Other (See Comments)  . Oxycodone Nausea Only  . Ciprofloxacin Swelling, Rash and Other (See Comments)  . Neomy-Bacit-Polymyx-Pramoxine Rash    "All antibiotics"  . Other Rash    "All antibiotics"  . Penicillins Swelling, Rash and Other (See Comments)     Pt is unable to answer additional questions about this medication.    . Prednisone Rash and Other (See Comments)  . Sulfa Antibiotics Swelling, Rash and Other (See Comments)    Family History: Family History  Problem Relation Age of Onset  . Diabetes Mother   . Heart disease Father   . Kidney disease Neg Hx   . Bladder Cancer Neg Hx     Social History:  reports that she quit smoking about 45 years ago. She has never used smokeless tobacco.  She reports that she does not drink alcohol or use drugs.  ROS: UROLOGY Frequent Urination?: Yes Hard to postpone urination?: Yes Burning/pain with urination?: Yes Get up at night to urinate?: Yes Leakage of urine?: Yes Urine stream starts and stops?: No Trouble starting stream?: Yes Do you have to strain to urinate?: Yes Blood in urine?: No Urinary tract infection?: Yes Sexually transmitted disease?: No Injury to kidneys or bladder?: No Painful intercourse?: No Weak stream?: No Currently pregnant?: No Vaginal bleeding?: No Last menstrual period?: n  Gastrointestinal Nausea?: No Vomiting?: No Indigestion/heartburn?: No Diarrhea?: No Constipation?: No  Constitutional Fever: No Night sweats?: No Weight loss?: No Fatigue?: No  Skin Skin rash/lesions?: No Itching?: No  Eyes Blurred vision?: No Double vision?: No  Ears/Nose/Throat Sore throat?: No Sinus problems?: No  Hematologic/Lymphatic Swollen glands?: No Easy bruising?: No  Cardiovascular Leg swelling?: No Chest pain?: No  Respiratory Cough?: No Shortness of breath?: No  Endocrine Excessive thirst?: No  Musculoskeletal Back pain?: No Joint pain?: No  Neurological Headaches?: No Dizziness?: No  Psychologic Depression?: No Anxiety?: No  Physical Exam: BP 120/69   Pulse 76   Ht 5\' 6"  (1.676 m)   Wt 124 lb 4.8 oz (56.4 kg)   BMI 20.06 kg/m   Constitutional: Well nourished. Alert and oriented, No acute distress. HEENT: [place] AT, moist  mucus membranes. Trachea midline, no masses. Cardiovascular: No clubbing, cyanosis, or edema. Respiratory: Normal respiratory effort, no increased work of breathing. GI: Abdomen is soft, non tender, non distended, no abdominal masses. Liver and spleen not palpable.  No hernias appreciated.  Stool sample for occult testing is not indicated.   GU: No CVA tenderness.  No bladder fullness or masses.  Atrophic external genitalia, normal pubic hair distribution, no lesions.  Normal urethral meatus, no lesions, no prolapse, no discharge.   No urethral masses, tenderness and/or tenderness. No bladder fullness, tenderness or masses. Normal vagina mucosa, good estrogen effect, no discharge, no lesions, good pelvic support, no cystocele or rectocele noted.  Cervix, uterus and ovaries are surgically absent.  Anus and perineum are without rashes or lesions.  Toilet paper is noted in the gluteal folds.   Skin: No rashes, bruises or suspicious lesions. Lymph: No cervical or inguinal adenopathy. Neurologic: Grossly intact, no focal deficits, moving all 4 extremities. Psychiatric: Normal mood and affect.  Laboratory Data: Lab Results  Component Value Date   WBC 12.1 (H) 05/06/2016   HGB 11.5 (L) 05/06/2016   HCT 34.1 (L) 05/06/2016   MCV 86.1 05/06/2016   PLT 328 05/06/2016    Lab Results  Component Value Date   CREATININE 1.06 (H) 05/06/2016     Lab Results  Component Value Date   HGBA1C 6.1 03/23/2014    Lab Results  Component Value Date   TSH 0.59 01/25/2016       Component Value Date/Time   CHOL 260 (H) 03/23/2014 1525   CHOL 282 (H) 12/26/2013 1415   HDL 43.60 03/23/2014 1525   HDL 42 12/26/2013 1415   CHOLHDL 6 03/23/2014 1525   VLDL 53.6 (H) 03/23/2014 1525   VLDL 49 (H) 12/26/2013 1415   LDLCALC 191 (H) 12/26/2013 1415    Lab Results  Component Value Date   AST 17 05/05/2016   Lab Results  Component Value Date   ALT 9 (L) 05/05/2016    Pertinent Imaging:  Results  for ANALEY, LATHEN (MRN [id number]) as of 03/13/2016 10:22  Ref. Range 03/13/2016 10:07  Scan Result Unknown 133  Assessment & Plan:    1. Recurrent UTI  - patient with several risk factors for recurrent UTI's  - continue vaginal estrogen cream, cranberry tablets and probiotics  - RTC for UTI symptoms                              2. Atrophic vaginitis:   Patient will continue the vaginal estrogen cream, applying it 3 nights weekly.   RTC in 12 months for exam.    3. Incontinence:   Patient feels that her Detrol is helping.  She will continue the medication and RTC in 12 months for PVR and OAB questionnaire.       Return in about 1 year (around 09/13/2017) for PVR, exam and OAB questionnaire.  These notes generated with voice recognition software. I apologize for typographical errors.  Zara Council, [facility] [address], [place] [address] [phone]

## 2016-10-03 ENCOUNTER — Emergency Department: Payer: PPO

## 2016-10-03 ENCOUNTER — Inpatient Hospital Stay
Admission: EM | Admit: 2016-10-03 | Discharge: 2016-10-06 | DRG: 689 | Disposition: A | Discharge status: Home / Self Care | Payer: PPO | Attending: Internal Medicine | Admitting: Internal Medicine

## 2016-10-03 DIAGNOSIS — Z7901 Long term (current) use of anticoagulants: Secondary | ICD-10-CM

## 2016-10-03 DIAGNOSIS — E785 Hyperlipidemia, unspecified: Secondary | ICD-10-CM | POA: Diagnosis not present

## 2016-10-03 DIAGNOSIS — N3020 Other chronic cystitis without hematuria: Secondary | ICD-10-CM | POA: Diagnosis not present

## 2016-10-03 DIAGNOSIS — I2510 Atherosclerotic heart disease of native coronary artery without angina pectoris: Secondary | ICD-10-CM | POA: Diagnosis not present

## 2016-10-03 DIAGNOSIS — B961 Klebsiella pneumoniae [K. pneumoniae] as the cause of diseases classified elsewhere: Secondary | ICD-10-CM | POA: Diagnosis present

## 2016-10-03 DIAGNOSIS — Z87891 Personal history of nicotine dependence: Secondary | ICD-10-CM | POA: Diagnosis not present

## 2016-10-03 DIAGNOSIS — F0390 Unspecified dementia without behavioral disturbance: Secondary | ICD-10-CM | POA: Diagnosis present

## 2016-10-03 DIAGNOSIS — I110 Hypertensive heart disease with heart failure: Secondary | ICD-10-CM | POA: Diagnosis not present

## 2016-10-03 DIAGNOSIS — R4182 Altered mental status, unspecified: Secondary | ICD-10-CM | POA: Diagnosis present

## 2016-10-03 DIAGNOSIS — Z90710 Acquired absence of both cervix and uterus: Secondary | ICD-10-CM | POA: Diagnosis not present

## 2016-10-03 DIAGNOSIS — I5032 Chronic diastolic (congestive) heart failure: Secondary | ICD-10-CM | POA: Diagnosis present

## 2016-10-03 DIAGNOSIS — Z79899 Other long term (current) drug therapy: Secondary | ICD-10-CM

## 2016-10-03 DIAGNOSIS — Z833 Family history of diabetes mellitus: Secondary | ICD-10-CM

## 2016-10-03 DIAGNOSIS — Z882 Allergy status to sulfonamides status: Secondary | ICD-10-CM

## 2016-10-03 DIAGNOSIS — Z8572 Personal history of non-Hodgkin lymphomas: Secondary | ICD-10-CM | POA: Diagnosis not present

## 2016-10-03 DIAGNOSIS — K219 Gastro-esophageal reflux disease without esophagitis: Secondary | ICD-10-CM | POA: Diagnosis not present

## 2016-10-03 DIAGNOSIS — N3000 Acute cystitis without hematuria: Secondary | ICD-10-CM | POA: Diagnosis not present

## 2016-10-03 DIAGNOSIS — G252 Other specified forms of tremor: Secondary | ICD-10-CM | POA: Diagnosis not present

## 2016-10-03 DIAGNOSIS — E039 Hypothyroidism, unspecified: Secondary | ICD-10-CM | POA: Diagnosis not present

## 2016-10-03 DIAGNOSIS — Z9842 Cataract extraction status, left eye: Secondary | ICD-10-CM

## 2016-10-03 DIAGNOSIS — Z8052 Family history of malignant neoplasm of bladder: Secondary | ICD-10-CM

## 2016-10-03 DIAGNOSIS — N390 Urinary tract infection, site not specified: Secondary | ICD-10-CM | POA: Diagnosis not present

## 2016-10-03 DIAGNOSIS — Z8249 Family history of ischemic heart disease and other diseases of the circulatory system: Secondary | ICD-10-CM | POA: Diagnosis not present

## 2016-10-03 DIAGNOSIS — E860 Dehydration: Secondary | ICD-10-CM | POA: Diagnosis not present

## 2016-10-03 DIAGNOSIS — Z9841 Cataract extraction status, right eye: Secondary | ICD-10-CM

## 2016-10-03 DIAGNOSIS — I480 Paroxysmal atrial fibrillation: Secondary | ICD-10-CM | POA: Diagnosis present

## 2016-10-03 DIAGNOSIS — Z79890 Hormone replacement therapy (postmenopausal): Secondary | ICD-10-CM

## 2016-10-03 DIAGNOSIS — Z96641 Presence of right artificial hip joint: Secondary | ICD-10-CM | POA: Diagnosis present

## 2016-10-03 DIAGNOSIS — I10 Essential (primary) hypertension: Secondary | ICD-10-CM | POA: Diagnosis not present

## 2016-10-03 DIAGNOSIS — R32 Unspecified urinary incontinence: Secondary | ICD-10-CM | POA: Diagnosis present

## 2016-10-03 DIAGNOSIS — G9341 Metabolic encephalopathy: Secondary | ICD-10-CM | POA: Diagnosis not present

## 2016-10-03 DIAGNOSIS — Z885 Allergy status to narcotic agent status: Secondary | ICD-10-CM

## 2016-10-03 DIAGNOSIS — G4700 Insomnia, unspecified: Secondary | ICD-10-CM | POA: Diagnosis present

## 2016-10-03 DIAGNOSIS — Z888 Allergy status to other drugs, medicaments and biological substances status: Secondary | ICD-10-CM

## 2016-10-03 DIAGNOSIS — Z880 Allergy status to penicillin: Secondary | ICD-10-CM

## 2016-10-03 DIAGNOSIS — D638 Anemia in other chronic diseases classified elsewhere: Secondary | ICD-10-CM | POA: Diagnosis present

## 2016-10-03 DIAGNOSIS — Z955 Presence of coronary angioplasty implant and graft: Secondary | ICD-10-CM

## 2016-10-03 DIAGNOSIS — Z881 Allergy status to other antibiotic agents status: Secondary | ICD-10-CM

## 2016-10-03 LAB — URINALYSIS, COMPLETE (UACMP) WITH MICROSCOPIC
BILIRUBIN URINE: NEGATIVE
Glucose, UA: NEGATIVE mg/dL
Hgb urine dipstick: NEGATIVE
Ketones, ur: NEGATIVE mg/dL
NITRITE: POSITIVE — AB
PH: 5 (ref 5.0–8.0)
Protein, ur: NEGATIVE mg/dL
SPECIFIC GRAVITY, URINE: 1.012 (ref 1.005–1.030)
Squamous Epithelial / LPF: NONE SEEN

## 2016-10-03 LAB — COMPREHENSIVE METABOLIC PANEL
ALK PHOS: 71 U/L (ref 38–126)
ALT: 11 U/L — ABNORMAL LOW (ref 14–54)
ANION GAP: 10 (ref 5–15)
AST: 21 U/L (ref 15–41)
Albumin: 3.7 g/dL (ref 3.5–5.0)
BILIRUBIN TOTAL: 0.5 mg/dL (ref 0.3–1.2)
BUN: 20 mg/dL (ref 6–20)
CALCIUM: 9 mg/dL (ref 8.9–10.3)
CO2: 25 mmol/L (ref 22–32)
Chloride: 107 mmol/L (ref 101–111)
Creatinine, Ser: 0.87 mg/dL (ref 0.44–1.00)
GFR, EST NON AFRICAN AMERICAN: 55 mL/min — AB (ref >60)
GLUCOSE: 85 mg/dL (ref 65–99)
Potassium: 4.1 mmol/L (ref 3.5–5.1)
Sodium: 142 mmol/L (ref 135–145)
TOTAL PROTEIN: 7.4 g/dL (ref 6.5–8.1)

## 2016-10-03 LAB — CBC
HCT: 35.8 % (ref 35.0–47.0)
HEMOGLOBIN: 12 g/dL (ref 12.0–16.0)
MCH: 28.2 pg (ref 26.0–34.0)
MCHC: 33.4 g/dL (ref 32.0–36.0)
MCV: 84.6 fL (ref 80.0–100.0)
Platelets: 416 10*3/uL (ref 150–440)
RBC: 4.23 MIL/uL (ref 3.80–5.20)
RDW: 16.6 % — ABNORMAL HIGH (ref 11.5–14.5)
WBC: 10.4 10*3/uL (ref 3.6–11.0)

## 2016-10-03 MED ORDER — BISACODYL 5 MG PO TBEC: 5.0000 mg | DELAYED_RELEASE_TABLET | Freq: Every day | ORAL | Status: DC | PRN

## 2016-10-03 MED ORDER — CEFTRIAXONE SODIUM 1 G IJ SOLR: 1.0000 g | Freq: Once | INTRAMUSCULAR | Status: DC

## 2016-10-03 MED ORDER — ONDANSETRON HCL 4 MG/2ML IJ SOLN: 4.0000 mg | Freq: Four times a day (QID) | INTRAMUSCULAR | Status: DC | PRN

## 2016-10-03 MED ORDER — DIBUCAINE 1 % EX OINT
TOPICAL_OINTMENT | Freq: Three times a day (TID) | CUTANEOUS | Status: DC | PRN
  Filled 2016-10-03: qty 28.35

## 2016-10-03 MED ORDER — ONDANSETRON HCL 4 MG PO TABS: 4.0000 mg | ORAL_TABLET | Freq: Four times a day (QID) | ORAL | Status: DC | PRN

## 2016-10-03 MED ORDER — LEVOTHYROXINE SODIUM 100 MCG PO TABS
100.0000 ug | ORAL_TABLET | Freq: Every day | ORAL | Status: DC
  Administered 2016-10-04 – 2016-10-06 (×3): 100 ug via ORAL
  Filled 2016-10-03 (×3): qty 1

## 2016-10-03 MED ORDER — PROPRANOLOL HCL 20 MG PO TABS
ORAL_TABLET | ORAL | Status: AC
  Administered 2016-10-03: 20 mg via ORAL
  Filled 2016-10-03: qty 1

## 2016-10-03 MED ORDER — APIXABAN 2.5 MG PO TABS
2.5000 mg | ORAL_TABLET | Freq: Two times a day (BID) | ORAL | Status: DC
  Administered 2016-10-04 – 2016-10-06 (×6): 2.5 mg via ORAL
  Filled 2016-10-03 (×6): qty 1

## 2016-10-03 MED ORDER — OXYBUTYNIN CHLORIDE ER 5 MG PO TB24
10.0000 mg | ORAL_TABLET | Freq: Every day | ORAL | Status: DC
  Administered 2016-10-04 – 2016-10-05 (×3): 10 mg via ORAL
  Filled 2016-10-03 (×3): qty 2

## 2016-10-03 MED ORDER — SODIUM CHLORIDE 0.9 % IV SOLN: Freq: Once | INTRAVENOUS | Status: DC

## 2016-10-03 MED ORDER — SENNOSIDES-DOCUSATE SODIUM 8.6-50 MG PO TABS: 1.0000 | ORAL_TABLET | Freq: Every evening | ORAL | Status: DC | PRN

## 2016-10-03 MED ORDER — ALBUTEROL SULFATE (2.5 MG/3ML) 0.083% IN NEBU: 2.5000 mg | INHALATION_SOLUTION | Freq: Four times a day (QID) | RESPIRATORY_TRACT | Status: DC | PRN

## 2016-10-03 MED ORDER — VITAMIN B-12 1000 MCG PO TABS
1000.0000 ug | ORAL_TABLET | Freq: Every day | ORAL | Status: DC
  Administered 2016-10-04 – 2016-10-06 (×3): 1000 ug via ORAL
  Filled 2016-10-03 (×3): qty 1

## 2016-10-03 MED ORDER — HYDROCORTISONE 2.5 % RE CREA
1.0000 "application " | TOPICAL_CREAM | Freq: Two times a day (BID) | RECTAL | Status: DC
  Filled 2016-10-03: qty 28.35

## 2016-10-03 MED ORDER — SODIUM CHLORIDE 0.9 % IV SOLN
INTRAVENOUS | Status: DC
  Administered 2016-10-04 – 2016-10-05 (×3): via INTRAVENOUS

## 2016-10-03 MED ORDER — RISAQUAD PO CAPS
1.0000 | ORAL_CAPSULE | Freq: Every day | ORAL | Status: DC
  Administered 2016-10-04 – 2016-10-06 (×3): 1 via ORAL
  Filled 2016-10-03 (×3): qty 1

## 2016-10-03 MED ORDER — CEFTRIAXONE SODIUM-DEXTROSE 1-3.74 GM-% IV SOLR
1.0000 g | Freq: Once | INTRAVENOUS | Status: AC
  Administered 2016-10-03: 1000 mg via INTRAVENOUS

## 2016-10-03 MED ORDER — TRIAMCINOLONE ACETONIDE 0.1 % EX CREA
1.0000 "application " | TOPICAL_CREAM | Freq: Two times a day (BID) | CUTANEOUS | Status: DC
  Filled 2016-10-03: qty 15

## 2016-10-03 MED ORDER — ADULT MULTIVITAMIN W/MINERALS CH
1.0000 | ORAL_TABLET | Freq: Every day | ORAL | Status: DC
  Administered 2016-10-04 – 2016-10-06 (×3): 1 via ORAL
  Filled 2016-10-03 (×3): qty 1

## 2016-10-03 MED ORDER — PROPRANOLOL HCL 20 MG PO TABS
30.0000 mg | ORAL_TABLET | Freq: Two times a day (BID) | ORAL | Status: DC
  Administered 2016-10-04 – 2016-10-06 (×5): 30 mg via ORAL
  Filled 2016-10-03 (×5): qty 2

## 2016-10-03 MED ORDER — IPRATROPIUM BROMIDE 0.02 % IN SOLN: 0.5000 mg | Freq: Four times a day (QID) | RESPIRATORY_TRACT | Status: DC | PRN

## 2016-10-03 MED ORDER — ESTROGENS, CONJUGATED 0.625 MG/GM VA CREA
1.0000 | TOPICAL_CREAM | VAGINAL | Status: DC
  Administered 2016-10-06: 09:00:00 1 via VAGINAL
  Filled 2016-10-03 (×2): qty 30

## 2016-10-03 MED ORDER — DEXTROMETHORPHAN POLISTIREX ER 30 MG/5ML PO SUER
30.0000 mg | Freq: Two times a day (BID) | ORAL | Status: DC | PRN
  Filled 2016-10-03: qty 5

## 2016-10-03 MED ORDER — HYDROCORTISONE ACETATE 25 MG RE SUPP
25.0000 mg | Freq: Two times a day (BID) | RECTAL | Status: DC
  Filled 2016-10-03 (×2): qty 1

## 2016-10-03 MED ORDER — ACETAMINOPHEN 325 MG PO TABS: 650.0000 mg | ORAL_TABLET | Freq: Four times a day (QID) | ORAL | Status: DC | PRN

## 2016-10-03 MED ORDER — ACETAMINOPHEN 650 MG RE SUPP: 650.0000 mg | Freq: Four times a day (QID) | RECTAL | Status: DC | PRN

## 2016-10-03 MED ORDER — CEFTRIAXONE SODIUM-DEXTROSE 1-3.74 GM-% IV SOLR
INTRAVENOUS | Status: AC
  Administered 2016-10-03: 1000 mg via INTRAVENOUS
  Filled 2016-10-03: qty 50

## 2016-10-03 MED ORDER — LOPERAMIDE HCL 2 MG PO CAPS: 4.0000 mg | ORAL_CAPSULE | Freq: Four times a day (QID) | ORAL | Status: DC | PRN

## 2016-10-03 MED ORDER — PANTOPRAZOLE SODIUM 40 MG PO TBEC
40.0000 mg | DELAYED_RELEASE_TABLET | Freq: Every day | ORAL | Status: DC
  Administered 2016-10-04 – 2016-10-06 (×3): 40 mg via ORAL
  Filled 2016-10-03 (×3): qty 1

## 2016-10-03 MED ORDER — MAGNESIUM CITRATE PO SOLN
1.0000 | Freq: Once | ORAL | Status: DC | PRN
  Filled 2016-10-03: qty 296

## 2016-10-03 MED ORDER — PROPRANOLOL HCL 20 MG PO TABS
20.0000 mg | ORAL_TABLET | Freq: Once | ORAL | Status: AC
  Administered 2016-10-03: 20 mg via ORAL

## 2016-10-03 NOTE — ED Notes (Signed)
Dr Robinson at bedside 

## 2016-10-03 NOTE — ED Notes (Signed)
Pt taken to CT via stretcher.

## 2016-10-03 NOTE — ED Notes (Signed)
Pt is going in and out of a fib on monitor. Dr. Quentin Cornwall at bedside. Family at bedside.

## 2016-10-03 NOTE — ED Notes (Signed)
Pt given turkey sandwich tray and water 

## 2016-10-03 NOTE — ED Notes (Signed)
Pt went into a fib with HR going up to 150's, pt HR came down but is still irregular. Will continue to monitor.

## 2016-10-03 NOTE — H&P (Signed)
History and Physical   [facility] - [place] @ [facility] Admission History and Physical [employer], D.O.    Patient Name: Evelyn [name] MR#: [id number] Date of Birth: [birth year] Date of Admission: 10/03/2016  Referring MD/NP/PA: Dr. Quentin Cornwall Primary Care Physician: Crecencio Mc, MD Patient coming from: [place]  Chief Complaint:  Chief Complaint  Patient presents with  . Altered Mental Status  Please note the entire history is obtained from the patient's emergency department chart, emergency department providers. Patient's personal history is limited by dementia and altered mental status.  HPI: Evelyn [name] is a 83 y.o. female with a known history of Anemia, lymphoma, coronary artery disease, GERD, hypertension, hyperlipidemia, insomnia, hypothyroidism, chronic diastolic CHF, PAF on Eliquis presents to the emergency department for evaluation of Altered mental status.  Patient was in a usual state of health until several days ago when she developed increasing confusion and disorientation during activities at her assisted living facility. Per emergency department records patient's son indicated that she has had similar symptoms in the past which were explained to urinary tract infection..   Otherwise there has been no change in status. Patient has been taking medication as prescribed and there has been no recent change in medication or diet.  No recent antibiotics.  There has been no recent illness, hospitalizations, travel or sick contacts.    ED Course: Patient received Rocephin, Inderal.  Review of Systems:  Unable to obtain secondary to altered mental status   Past Medical History:  Diagnosis Date  . Anemia    hx of blood transfusion  . Cancer ([place])    lymphomia  . Coarse tremors    head shakes if does not take propranolol  . Coronary artery disease   . GERD (gastroesophageal reflux disease)   . History of lymphoma   . Hyperlipidemia   . Hypertension    . Insomnia    takes remeron nightly  . Other chronic cystitis    Radiation , botox injections  . Self-catheterizes urinary bladder    twice daily  . Thyroid disease    had thyroid removed    Past Surgical History:  Procedure Laterality Date  . ABDOMINAL HYSTERECTOMY  1970s  . APPENDECTOMY    . BACK SURGERY     lower back - bulging disk  . CARDIAC CATHETERIZATION  2005   2 cardiac stents  . CYSTOSCOPY  April 2013  . EYE SURGERY     bilateral cataracts  . JOINT REPLACEMENT  2012   right hip  . LUMBAR LAMINECTOMY/DECOMPRESSION MICRODISCECTOMY  01/24/2012   Procedure: LUMBAR LAMINECTOMY/DECOMPRESSION MICRODISCECTOMY 2 LEVELS;  Surgeon: Floyce Stakes, MD;  Location: [facility];  Service: Neurosurgery;  Laterality: Right;  Right Lumbar four five, lumbar five sacral one foraminotomy   . [place] SURGERY  2008   [place]  . STOMACH SURGERY     removal all but small portion of stomach out due to ulcers     reports that she quit smoking about 45 years ago. She has never used smokeless tobacco. She reports that she does not drink alcohol or use drugs.  Allergies  Allergen Reactions  . Cephalosporins Other (See Comments)  . Macrolides And Ketolides Other (See Comments)  . Oxycodone Nausea Only  . Ciprofloxacin Swelling, Rash and Other (See Comments)  . Neomy-Bacit-Polymyx-Pramoxine Rash    "All antibiotics"  . Other Rash    "All antibiotics"  . Penicillins Swelling, Rash and Other (See Comments)    Pt is unable to  answer additional questions about this medication.    . Prednisone Rash and Other (See Comments)  . Sulfa Antibiotics Swelling, Rash and Other (See Comments)    Family History  Problem Relation Age of Onset  . Diabetes Mother   . Heart disease Father   . Kidney disease Neg Hx   . Bladder Cancer Neg Hx    Family history has been reviewed and confirmed with patient.   Prior to Admission medications   Medication Sig Start Date End Date Taking? Authorizing  Provider  acetaminophen (TYLENOL) 325 MG tablet Take 650 mg by mouth every 8 (eight) hours as needed.   Yes Historical Provider, MD  apixaban (ELIQUIS) 2.5 MG TABS tablet Take 2.5 mg by mouth 2 (two) times daily.   Yes Historical Provider, MD  conjugated estrogens (PREMARIN) vaginal cream Place 1 Applicatorful vaginally every Monday, Wednesday, and Friday. 05/15/16  Yes Crecencio Mc, MD  Cranberry 250 MG TABS Take 1 tablet (250 mg total) by mouth 2 (two) times daily. 02/21/16  Yes Shannon A McGowan, PA-C  dextromethorphan (DELSYM) 30 MG/5ML liquid Take 30 mg by mouth every 12 (twelve) hours as needed for cough.   Yes Historical Provider, MD  dibucaine (NUPERCAINAL) 1 % ointment Apply topically 3 (three) times daily as needed for pain. From henmorrhoids 05/26/16  Yes Crecencio Mc, MD  diphenhydrAMINE (BENADRYL) 50 MG capsule Take 50 mg by mouth at bedtime as needed.   Yes Historical Provider, MD  hydrocortisone (ANUSOL-HC) 2.5 % rectal cream Place 1 application rectally 2 (two) times daily. 07/13/16  Yes Burnard Hawthorne, FNP  hydrocortisone (ANUSOL-HC) 25 MG suppository Place 1 suppository (25 mg total) rectally 2 (two) times daily. 07/13/16  Yes Burnard Hawthorne, FNP  levothyroxine (SYNTHROID, LEVOTHROID) 100 MCG tablet Take 100 mcg by mouth daily.   Yes Historical Provider, MD  loperamide (IMODIUM A-D) 2 MG tablet Take 4 mg by mouth 4 (four) times daily as needed for diarrhea or loose stools.   Yes Historical Provider, MD  mirtazapine (REMERON) 7.5 MG tablet Take 1 tablet (7.5 mg total) by mouth at bedtime. 06/22/15  Yes Crecencio Mc, MD  Multiple Vitamins-Minerals (MULTIVITAMIN WITH MINERALS) tablet Take 1 tablet by mouth daily.   Yes Historical Provider, MD  omeprazole (PRILOSEC) 20 MG capsule Take 20 mg by mouth daily.   Yes Historical Provider, MD  propranolol (INDERAL) 20 MG tablet Take 1.5 tablets (30 mg total) by mouth 2 (two) times daily. 01/25/16  Yes Crecencio Mc, MD  tolterodine  (DETROL) 2 MG tablet Take 4 mg by mouth daily.   Yes Historical Provider, MD  triamcinolone cream (KENALOG) 0.1 % Apply 1 application topically 2 (two) times daily. To irritated area 06/20/16  Yes Crecencio Mc, MD  vitamin B-12 (CYANOCOBALAMIN) 1000 MCG tablet Take 1,000 mcg by mouth daily.   Yes Historical Provider, MD  zolpidem (AMBIEN) 5 MG tablet Take 5 mg by mouth at bedtime as needed for sleep.   Yes Historical Provider, MD  amoxicillin-clavulanate (AUGMENTIN XR) 1000-62.5 MG 12 hr tablet Take 2 tablets by mouth 2 (two) times daily. Patient not taking: Reported on 09/13/2016 07/21/16   Burnard Hawthorne, FNP  fesoterodine (TOVIAZ) 4 MG TB24 tablet Take 1 tablet (4 mg total) by mouth daily. Patient not taking: Reported on 09/13/2016 03/13/16   Larene [place] A McGowan, PA-C  predniSONE (DELTASONE) 10 MG tablet Take 4 tablets ( total 40 mg) by mouth for 2 days; take 3 tablets ( total  30 mg) by mouth for 2 days; take 2 tablets ( total 20 mg) by mouth for 1 day; take 1 tablet ( total 10 mg) by mouth for 1 day. Patient not taking: Reported on 09/13/2016 07/27/16   Burnard Hawthorne, FNP  Probiotic Product (PROBIOTIC PO) Take by mouth.    Historical Provider, MD    Physical Exam: Vitals:   10/03/16 2030 10/03/16 2100 10/03/16 2130 10/03/16 2200  BP: (!) 162/75 (!) 151/76 (!) 149/126 (!) 155/69  Pulse: 66 97 94 72  Resp: 15 (!) 21 (!) 23 16  Temp:      TempSrc:      SpO2: 92% 97% 98% 95%  Weight:      Height:        GENERAL: 83 y.o.-year-old Female patient, well-developed, well-nourished lying in the bed in no acute distress.  Pleasant and cooperative.   HEENT: Head atraumatic, normocephalic. Pupils equal, round, reactive to light and accommodation. No scleral icterus. Extraocular muscles intact. Nares are patent. Oropharynx is clear. Mucus membranes moist. NECK: Supple, full range of motion. No JVD, no bruit heard. No thyroid enlargement, no tenderness, no cervical lymphadenopathy. CHEST: Normal  breath sounds bilaterally. No wheezing, rales, rhonchi or crackles. No use of accessory muscles of respiration.  No reproducible chest wall tenderness.  CARDIOVASCULAR: S1, S2 normal. No murmurs, rubs, or gallops. Cap refill <2 seconds. Pulses intact distally.  ABDOMEN: Soft, nondistended, nontender. No rebound, guarding, rigidity. Normoactive bowel sounds present in all four quadrants. No organomegaly or mass. EXTREMITIES: No pedal edema, cyanosis, or clubbing. No calf tenderness or Homan's sign.  NEUROLOGIC: The patient is alert and oriented x 2. Cranial nerves II through XII are grossly intact with no focal sensorimotor deficit. Muscle strength 5/5 in all extremities. Sensation intact. Gait not checked. PSYCHIATRIC:  Normal affect, mood, thought content. SKIN: Warm, dry, and intact without obvious rash, lesion, or ulcer.    Labs on Admission:  CBC:  Recent Labs Lab 10/03/16 1803  WBC 10.4  HGB 12.0  HCT 35.8  MCV 84.6  PLT 123456   Basic Metabolic Panel:  Recent Labs Lab 10/03/16 1803  NA 142  K 4.1  CL 107  CO2 25  GLUCOSE 85  BUN 20  CREATININE 0.87  CALCIUM 9.0   GFR: Estimated Creatinine Clearance: 35.4 mL/min (by C-G formula based on SCr of 0.87 mg/dL). Liver Function Tests:  Recent Labs Lab 10/03/16 1803  AST 21  ALT 11*  ALKPHOS 71  BILITOT 0.5  PROT 7.4  ALBUMIN 3.7   No results for input(s): LIPASE, AMYLASE in the last 168 hours. No results for input(s): AMMONIA in the last 168 hours. Coagulation Profile: No results for input(s): INR, PROTIME in the last 168 hours. Cardiac Enzymes: No results for input(s): CKTOTAL, CKMB, CKMBINDEX, TROPONINI in the last 168 hours. BNP (last 3 results) No results for input(s): PROBNP in the last 8760 hours. HbA1C: No results for input(s): HGBA1C in the last 72 hours. CBG: No results for input(s): GLUCAP in the last 168 hours. Lipid Profile: No results for input(s): CHOL, HDL, LDLCALC, TRIG, CHOLHDL, LDLDIRECT in  the last 72 hours. Thyroid Function Tests: No results for input(s): TSH, T4TOTAL, FREET4, T3FREE, THYROIDAB in the last 72 hours. Anemia Panel: No results for input(s): VITAMINB12, FOLATE, FERRITIN, TIBC, IRON, RETICCTPCT in the last 72 hours. Urine analysis:    Component Value Date/Time   COLORURINE YELLOW (A) 10/03/2016 1901   APPEARANCEUR CLOUDY (A) 10/03/2016 1901   APPEARANCEUR Cloudy (A)  02/21/2016 1409   LABSPEC 1.012 10/03/2016 1901   LABSPEC 1.006 12/26/2013 1150   PHURINE 5.0 10/03/2016 1901   GLUCOSEU NEGATIVE 10/03/2016 1901   GLUCOSEU NEGATIVE 01/25/2016 1602   HGBUR NEGATIVE 10/03/2016 1901   BILIRUBINUR NEGATIVE 10/03/2016 1901   BILIRUBINUR negative 07/17/2016 1545   BILIRUBINUR Negative 02/21/2016 1409   BILIRUBINUR Negative 12/26/2013 [place] 10/03/2016 1901   PROTEINUR NEGATIVE 10/03/2016 1901   UROBILINOGEN 0.2 07/17/2016 1545   UROBILINOGEN 0.2 01/25/2016 1602   NITRITE POSITIVE (A) 10/03/2016 1901   LEUKOCYTESUR LARGE (A) 10/03/2016 1901   LEUKOCYTESUR 3+ (A) 02/21/2016 1409   LEUKOCYTESUR 3+ 12/26/2013 1150   Sepsis Labs: @LABRCNTIP (procalcitonin:4,lacticidven:4) )No results found for this or any previous visit (from the past 240 hour(s)).   Radiological Exams on Admission: Ct Head Wo Contrast  Result Date: 10/03/2016 CLINICAL DATA:  Altered mental status for 3 days, concurrent urinary tract infection and incontinence. History of lymphoma. EXAM: CT HEAD WITHOUT CONTRAST TECHNIQUE: Contiguous axial images were obtained from the base of the skull through the vertex without intravenous contrast. COMPARISON:  CT HEAD May 05, 2015 FINDINGS: BRAIN: The ventricles and sulci are normal for age. No intraparenchymal hemorrhage, mass effect nor midline shift. Confluent supratentorial white matter hypodensities. Old small RIGHT cerebellar infarcts. Old bilateral thalamus lacunar infarcts. Old RIGHT basal ganglia lacunar infarct. Acute large vascular  territory infarcts. No abnormal extra-axial fluid collections. Basal cisterns are patent. VASCULAR: Moderate calcific atherosclerosis of the carotid siphons. SKULL: No skull fracture. Osteopenia. No significant scalp soft tissue swelling. SINUSES/ORBITS: Severe chronic LEFT maxillary sinusitis with central dense inspissated mucus versus fungal component, mild chronic RIGHT maxillary sinusitis. The included ocular globes and orbital contents are non-suspicious. OTHER: None. IMPRESSION: 1. No acute intracranial process 2. Severe chronic small vessel ischemic disease. 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 Electronically Signed   By: Elon Alas M.D.   On: 10/03/2016 20:00    EKG: Normal sinus rhythm at 80 bpm with normal axis and nonspecific ST-T wave changes.   Assessment/Plan   This is a 83 y.o. female with a history of anemia, lymphoma, coronary artery disease, GERD, hypertension, hyperlipidemia, insomnia, hypothyroidism, chronic diastolic CHF, PAF on Eliquis now being admitted with:  1. Altered mental status secondary to urinary tract infection -Admit inpatient -IV antibiotics: Rocephin -Gentle IV fluids -Bacid -Follow up urine cultures - Hold off on sedatives right now including Remeron and Ambien  2. History of anemia - Monitor CBC  3. History of chronic diastolic CHF - Continue beta blocker  4. History of GERD - Continue Protonix  5. History of hypertension - Continue Propranolol  6. History of hyperlipidemia, not currently medicated  7. History of insomnia - Hold off on sedatives right now including Remeron and Ambien  8. Atrial fibrillation -Continue propranolol,Eliquis  9. History of hypothyroidism - Continue Synthroid  10. History of urinary incontinence -Continue to Ditropan   Admission status: Inpatient IV Fluids: Normal saline Diet/Nutrition: Heart healthy Consults called: PT  DVT Px: Eliquis SCDs and early ambulation. Code Status: Full  Code  Disposition Plan: To home in 1-2 days   All the records are reviewed and case discussed with ED provider. Management plans discussed with the patient and/or family who express understanding and agree with plan of care.  Surie Suchocki D.O. on 10/03/2016 at 10:19 PM Between 7am to 6pm - Pager - [phone] After 6pm go to [email or web address] - [facility] Office [phone] CC:  Primary care physician; Crecencio Mc, MD   10/03/2016, 10:19 PM

## 2016-10-03 NOTE — ED Triage Notes (Addendum)
Pt reports to ED w/ c/o AMS. Pt bib sons, who state that pt not "acting her norm" since Saturday. Pt from [place], family reports that staff stated she was altered.  Pt alert, oriented to self and situation, disoriented to time.  Resp even and unlabored, able to answer questions.  NAD. Pt denies pain or SOB at this time. Family sts pt has hx if UTI and is incontinent. Family sts pt more confused, does not realize she lives at [place] which is her norm

## 2016-10-03 NOTE — ED Provider Notes (Signed)
[facility] Emergency Department Provider Note    First MD Initiated Contact with Patient 10/03/16 1830     (approximate)  I have reviewed the triage vital signs and the nursing notes.   HISTORY  Chief Complaint Altered Mental Status    HPI Evelyn [name] is a 83 y.o. female who is very pleasant presents to the ER from [facility] assisted living facility due to concern for altered mental status and confusion for the past several days. She brought in by her sons who say that she is "not acting normal". States that she normally gets this way when she has a urinary tract infection. States that today she was more confused and disoriented at her facility when they're doing daily activities. She was working with staff and tried to leave the room through exit door. When asked what she was doing she generally and could not give an answer. The patient denies any discomfort. No fevers. No chest pain or shortness of breath.   Past Medical History:  Diagnosis Date  . Anemia    hx of blood transfusion  . Cancer ([place])    lymphomia  . Coarse tremors    head shakes if does not take propranolol  . Coronary artery disease   . GERD (gastroesophageal reflux disease)   . History of lymphoma   . Hyperlipidemia   . Hypertension   . Insomnia    takes remeron nightly  . Other chronic cystitis    Radiation , botox injections  . Self-catheterizes urinary bladder    twice daily  . Thyroid disease    had thyroid removed   Family History  Problem Relation Age of Onset  . Diabetes Mother   . Heart disease Father   . Kidney disease Neg Hx   . Bladder Cancer Neg Hx    Past Surgical History:  Procedure Laterality Date  . ABDOMINAL HYSTERECTOMY  1970s  . APPENDECTOMY    . BACK SURGERY     lower back - bulging disk  . CARDIAC CATHETERIZATION  2005   2 cardiac stents  . CYSTOSCOPY  April 2013  . EYE SURGERY     bilateral cataracts  . JOINT REPLACEMENT  2012   right hip  . LUMBAR LAMINECTOMY/DECOMPRESSION MICRODISCECTOMY  01/24/2012   Procedure: LUMBAR LAMINECTOMY/DECOMPRESSION MICRODISCECTOMY 2 LEVELS;  Surgeon: Floyce Stakes, MD;  Location: [place] [facility];  Service: Neurosurgery;  Laterality: Right;  Right Lumbar four five, lumbar five sacral one foraminotomy   . [place] SURGERY  2008   [place]  . STOMACH SURGERY     removal all but small portion of stomach out due to ulcers   Patient Active Problem List   Diagnosis Date Noted  . Altered mental status 10/03/2016  . Diaper dermatitis 08/16/2016  . Decubitus ulcer of sacral region, stage 1 08/14/2016  . Urinary incontinence 06/07/2016  . Encounter for Medicare annual wellness exam 05/28/2016  . Inflamed external hemorrhoid 05/28/2016  . Bilateral hearing loss due to cerumen impaction 05/26/2016  . Viral URI with cough 05/14/2016  . Aortic stenosis, mild 05/14/2016  . Skin benign neoplasm 01/26/2016  . Anorexia 06/22/2015  . Chronic diastolic heart failure ([place]) 03/04/2015  . Paroxysmal atrial fibrillation ([place]) 05/29/2014  . Diarrhea 12/18/2013  . ESBL (extended spectrum beta-lactamase) producing bacteria infection 12/09/2013  . Unspecified vitamin D deficiency 11/25/2013  . Chronic insomnia 11/23/2013  . Bradycardia 04/10/2013  . Generalized muscle weakness 04/10/2013  . Anemia, iron deficiency 12/27/2012  .  Anemia, pernicious 10/15/2011  . Spinal stenosis of lumbar region 10/15/2011  . Hypertension   . Hyperlipidemia   . Hypothyroidism   . Cystitis, chronic   . History of lymphoma       Prior to Admission medications   Medication Sig Start Date End Date Taking? Authorizing Provider  acetaminophen (TYLENOL) 325 MG tablet Take 650 mg by mouth every 8 (eight) hours as needed.   Yes Historical Provider, MD  apixaban (ELIQUIS) 2.5 MG TABS tablet Take 2.5 mg by mouth 2 (two) times daily.   Yes Historical Provider, MD  conjugated estrogens (PREMARIN) vaginal cream Place 1  Applicatorful vaginally every Monday, Wednesday, and Friday. 05/15/16  Yes Crecencio Mc, MD  Cranberry 250 MG TABS Take 1 tablet (250 mg total) by mouth 2 (two) times daily. 02/21/16  Yes Shannon A McGowan, PA-C  dextromethorphan (DELSYM) 30 MG/5ML liquid Take 30 mg by mouth every 12 (twelve) hours as needed for cough.   Yes Historical Provider, MD  dibucaine (NUPERCAINAL) 1 % ointment Apply topically 3 (three) times daily as needed for pain. From henmorrhoids 05/26/16  Yes Crecencio Mc, MD  diphenhydrAMINE (BENADRYL) 50 MG capsule Take 50 mg by mouth at bedtime as needed.   Yes Historical Provider, MD  hydrocortisone (ANUSOL-HC) 2.5 % rectal cream Place 1 application rectally 2 (two) times daily. 07/13/16  Yes Burnard Hawthorne, FNP  hydrocortisone (ANUSOL-HC) 25 MG suppository Place 1 suppository (25 mg total) rectally 2 (two) times daily. 07/13/16  Yes Burnard Hawthorne, FNP  levothyroxine (SYNTHROID, LEVOTHROID) 100 MCG tablet Take 100 mcg by mouth daily.   Yes Historical Provider, MD  loperamide (IMODIUM A-D) 2 MG tablet Take 4 mg by mouth 4 (four) times daily as needed for diarrhea or loose stools.   Yes Historical Provider, MD  mirtazapine (REMERON) 7.5 MG tablet Take 1 tablet (7.5 mg total) by mouth at bedtime. 06/22/15  Yes Crecencio Mc, MD  Multiple Vitamins-Minerals (MULTIVITAMIN WITH MINERALS) tablet Take 1 tablet by mouth daily.   Yes Historical Provider, MD  omeprazole (PRILOSEC) 20 MG capsule Take 20 mg by mouth daily.   Yes Historical Provider, MD  propranolol (INDERAL) 20 MG tablet Take 1.5 tablets (30 mg total) by mouth 2 (two) times daily. 01/25/16  Yes Crecencio Mc, MD  tolterodine (DETROL) 2 MG tablet Take 4 mg by mouth daily.   Yes Historical Provider, MD  triamcinolone cream (KENALOG) 0.1 % Apply 1 application topically 2 (two) times daily. To irritated area 06/20/16  Yes Crecencio Mc, MD  vitamin B-12 (CYANOCOBALAMIN) 1000 MCG tablet Take 1,000 mcg by mouth daily.   Yes  Historical Provider, MD  zolpidem (AMBIEN) 5 MG tablet Take 5 mg by mouth at bedtime as needed for sleep.   Yes Historical Provider, MD  amoxicillin-clavulanate (AUGMENTIN XR) 1000-62.5 MG 12 hr tablet Take 2 tablets by mouth 2 (two) times daily. Patient not taking: Reported on 09/13/2016 07/21/16   Burnard Hawthorne, FNP  fesoterodine (TOVIAZ) 4 MG TB24 tablet Take 1 tablet (4 mg total) by mouth daily. Patient not taking: Reported on 09/13/2016 03/13/16   Larene Beach A McGowan, PA-C  predniSONE (DELTASONE) 10 MG tablet Take 4 tablets ( total 40 mg) by mouth for 2 days; take 3 tablets ( total 30 mg) by mouth for 2 days; take 2 tablets ( total 20 mg) by mouth for 1 day; take 1 tablet ( total 10 mg) by mouth for 1 day. Patient not taking: Reported on  09/13/2016 07/27/16   Burnard Hawthorne, FNP  Probiotic Product (PROBIOTIC PO) Take by mouth.    Historical Provider, MD    Allergies Cephalosporins; Macrolides and ketolides; Oxycodone; Ciprofloxacin; Neomy-bacit-polymyx-pramoxine; Other; Penicillins; Prednisone; and Sulfa antibiotics    Social History Social History  Substance Use Topics  . Smoking status: Former Smoker    Quit date: 04/04/1971  . Smokeless tobacco: Never Used  . Alcohol use No    Review of Systems Patient denies headaches, rhinorrhea, blurry vision, numbness, shortness of breath, chest pain, edema, cough, abdominal pain, nausea, vomiting, diarrhea, dysuria, fevers, rashes or hallucinations unless otherwise stated above in HPI. ____________________________________________   PHYSICAL EXAM:  VITAL SIGNS: Vitals:   10/03/16 2300 10/03/16 2312  BP: (!) 159/96 134/69  Pulse:  91  Resp: (!) 26 15  Temp:      Constitutional: Alert, elderly appearing but in no acute distress. Eyes: Conjunctivae are normal. PERRL. EOMI. Head: Atraumatic. Nose: No congestion/rhinnorhea. Mouth/Throat: Mucous membranes are moist.  Oropharynx non-erythematous. Neck: No stridor. Painless ROM. No  cervical spine tenderness to palpation Hematological/Lymphatic/Immunilogical: No cervical lymphadenopathy. Cardiovascular: Normal rate, regular rhythm. Grossly normal heart sounds.  Good peripheral circulation. Respiratory: Normal respiratory effort.  No retractions. Lungs CTAB. Gastrointestinal: Soft and nontender. No distention. No abdominal bruits. No CVA tenderness. Musculoskeletal: No lower extremity tenderness nor edema.  No joint effusions. Neurologic:  Normal speech and language. No gross focal neurologic deficits are appreciated. No facial droop Skin:  Skin is warm, dry and intact. No rash noted. Psychiatric: Mood and affect are normal. Speech and behavior are normal.  ____________________________________________   LABS (all labs ordered are listed, but only abnormal results are displayed)  Results for orders placed or performed during the hospital encounter of 10/03/16 (from the past 24 hour(s))  Comprehensive metabolic panel     Status: Abnormal   Collection Time: 10/03/16  6:03 PM  Result Value Ref Range   Sodium 142 135 - 145 mmol/L   Potassium 4.1 3.5 - 5.1 mmol/L   Chloride 107 101 - 111 mmol/L   CO2 25 22 - 32 mmol/L   Glucose, Bld 85 65 - 99 mg/dL   BUN 20 6 - 20 mg/dL   Creatinine, Ser 0.87 0.44 - 1.00 mg/dL   Calcium 9.0 8.9 - 10.3 mg/dL   Total Protein 7.4 6.5 - 8.1 g/dL   Albumin 3.7 3.5 - 5.0 g/dL   AST 21 15 - 41 U/L   ALT 11 (L) 14 - 54 U/L   Alkaline Phosphatase 71 38 - 126 U/L   Total Bilirubin 0.5 0.3 - 1.2 mg/dL   GFR calc non Af Amer 55 (L) >60 mL/min   GFR calc Af Amer >60 >60 mL/min   Anion gap 10 5 - 15  CBC     Status: Abnormal   Collection Time: 10/03/16  6:03 PM  Result Value Ref Range   WBC 10.4 3.6 - 11.0 K/uL   RBC 4.23 3.80 - 5.20 MIL/uL   Hemoglobin 12.0 12.0 - 16.0 g/dL   HCT 35.8 35.0 - 47.0 %   MCV 84.6 80.0 - 100.0 fL   MCH 28.2 26.0 - 34.0 pg   MCHC 33.4 32.0 - 36.0 g/dL   RDW 16.6 (H) 11.5 - 14.5 %   Platelets 416 150 - 440  K/uL  Urinalysis, Complete w Microscopic     Status: Abnormal   Collection Time: 10/03/16  7:01 PM  Result Value Ref Range   Color, Urine YELLOW (A)  YELLOW   APPearance CLOUDY (A) CLEAR   Specific Gravity, Urine 1.012 1.005 - 1.030   pH 5.0 5.0 - 8.0   Glucose, UA NEGATIVE NEGATIVE mg/dL   Hgb urine dipstick NEGATIVE NEGATIVE   Bilirubin Urine NEGATIVE NEGATIVE   Ketones, ur NEGATIVE NEGATIVE mg/dL   Protein, ur NEGATIVE NEGATIVE mg/dL   Nitrite POSITIVE (A) NEGATIVE   Leukocytes, UA LARGE (A) NEGATIVE   RBC / HPF 6-30 0 - 5 RBC/hpf   WBC, UA TOO NUMEROUS TO COUNT 0 - 5 WBC/hpf   Bacteria, UA MANY (A) NONE SEEN   Squamous Epithelial / LPF NONE SEEN NONE SEEN   WBC Clumps PRESENT    Mucous PRESENT    Amorphous Crystal PRESENT    ____________________________________________  EKG My review and personal interpretation at Time: 19:08   Indication: abd pain  Rate: 80  Rhythm: sinus Axis: normal Other:  No stemi, normal intervals _____________________________  ____________________________________________  RADIOLOGY  I personally reviewed all radiographic images ordered to evaluate for the above acute complaints and reviewed radiology reports and findings.  These findings were personally discussed with the patient.  Please see medical record for radiology report.  ____________________________________________   PROCEDURES  Procedure(s) performed:  Procedures    Critical Care performed: no ____________________________________________   INITIAL IMPRESSION / ASSESSMENT AND PLAN / ED COURSE  Pertinent labs & imaging results that were available during my care of the patient were reviewed by me and considered in my medical decision making (see chart for details).  DDX: Dehydration, sepsis, pna, uti, hypoglycemia, cva, drug effect, withdrawal,   MEARLE AVILEZ is a 83 y.o. who presents to the ED with Chief complaint of confusion as described above. Patient afebrile and  hemodynamically stable. No evidence of acute neurologic deficit other than his confusion. There will could be component of underlying dementia at the patient does not carry the diagnosis and symptoms seemed to improve acutely changed. Will order blood work as well as CT imaging to evaluate for acute abnormality. Patient with a history of UTIs that precipitated similar symptoms.  The patient will be placed on continuous pulse oximetry and telemetry for monitoring.  Laboratory evaluation will be sent to evaluate for the above complaints.     Clinical Course as of Oct 04 25  Tue Oct 03, 2016  2108 I have been trying to encourage patient to drink water but patient not tolerating oral hydration. Will start gentle saline fluids for dehydration. I do feel patient will require admission given her confusion and evidence of UTI.  Have discussed with the patient and available family all diagnostics and treatments performed thus far and all questions were answered to the best of my ability. The patient demonstrates understanding and agreement with plan.   [PR]    Clinical Course User Index [PR] Merlyn Lot, MD     ____________________________________________   FINAL CLINICAL IMPRESSION(S) / ED DIAGNOSES  Final diagnoses:  Altered mental status, unspecified altered mental status type  Acute cystitis without hematuria      NEW MEDICATIONS STARTED DURING THIS VISIT:  Current Discharge Medication List       Note:  This document was prepared using Dragon voice recognition software and may include unintentional dictation errors.    Merlyn Lot, MD 10/04/16 [phone]

## 2016-10-04 LAB — BASIC METABOLIC PANEL
Anion gap: 6 (ref 5–15)
BUN: 21 mg/dL — AB (ref 6–20)
CHLORIDE: 110 mmol/L (ref 101–111)
CO2: 26 mmol/L (ref 22–32)
CREATININE: 0.99 mg/dL (ref 0.44–1.00)
Calcium: 8.3 mg/dL — ABNORMAL LOW (ref 8.9–10.3)
GFR calc Af Amer: 55 mL/min — ABNORMAL LOW (ref >60)
GFR calc non Af Amer: 47 mL/min — ABNORMAL LOW (ref >60)
Glucose, Bld: 89 mg/dL (ref 65–99)
Potassium: 3.8 mmol/L (ref 3.5–5.1)
Sodium: 142 mmol/L (ref 135–145)

## 2016-10-04 LAB — MRSA PCR SCREENING: MRSA by PCR: NEGATIVE

## 2016-10-04 LAB — CBC
HCT: 31 % — ABNORMAL LOW (ref 35.0–47.0)
Hemoglobin: 10.3 g/dL — ABNORMAL LOW (ref 12.0–16.0)
MCH: 27.5 pg (ref 26.0–34.0)
MCHC: 33.1 g/dL (ref 32.0–36.0)
MCV: 82.9 fL (ref 80.0–100.0)
PLATELETS: 365 10*3/uL (ref 150–440)
RBC: 3.74 MIL/uL — ABNORMAL LOW (ref 3.80–5.20)
RDW: 16.4 % — AB (ref 11.5–14.5)
WBC: 9 10*3/uL (ref 3.6–11.0)

## 2016-10-04 MED ORDER — HYDROCORTISONE 2.5 % RE CREA
1.0000 "application " | TOPICAL_CREAM | Freq: Two times a day (BID) | RECTAL | Status: DC | PRN
  Administered 2016-10-05: 1 via RECTAL
  Filled 2016-10-04: qty 28.35

## 2016-10-04 MED ORDER — AMLODIPINE BESYLATE 5 MG PO TABS
5.0000 mg | ORAL_TABLET | Freq: Every day | ORAL | Status: DC
  Administered 2016-10-04: 5 mg via ORAL
  Filled 2016-10-04: qty 1

## 2016-10-04 MED ORDER — MENTHOL 3 MG MT LOZG
1.0000 | LOZENGE | OROMUCOSAL | Status: DC | PRN
  Filled 2016-10-04: qty 9

## 2016-10-04 MED ORDER — CEFTRIAXONE SODIUM-DEXTROSE 2-2.22 GM-% IV SOLR
2.0000 g | INTRAVENOUS | Status: DC
  Administered 2016-10-04: 2 g via INTRAVENOUS
  Filled 2016-10-04 (×2): qty 50

## 2016-10-04 MED ORDER — TRIAMCINOLONE ACETONIDE 0.1 % EX CREA
1.0000 "application " | TOPICAL_CREAM | Freq: Two times a day (BID) | CUTANEOUS | Status: DC | PRN
  Filled 2016-10-04: qty 15

## 2016-10-04 MED ORDER — ALUM & MAG HYDROXIDE-SIMETH 200-200-20 MG/5ML PO SUSP
15.0000 mL | ORAL | Status: DC | PRN
  Administered 2016-10-04 (×2): 15 mL via ORAL
  Filled 2016-10-04 (×2): qty 30

## 2016-10-04 MED ORDER — CEFTRIAXONE SODIUM 1 G IJ SOLR
1.0000 g | INTRAMUSCULAR | Status: DC
  Administered 2016-10-04 – 2016-10-05 (×2): 1 g via INTRAVENOUS
  Filled 2016-10-04 (×3): qty 10

## 2016-10-04 MED ORDER — HYDROCORTISONE ACETATE 25 MG RE SUPP
25.0000 mg | Freq: Two times a day (BID) | RECTAL | Status: DC | PRN
  Filled 2016-10-04: qty 1

## 2016-10-04 NOTE — Progress Notes (Signed)
Pharmacy Antibiotic Note  Evelyn [name] is a 83 y.o. female admitted on 10/03/2016 with UTI.  Pharmacy has been consulted for ceftriaxone dosing.  Plan: Patient has listed cephalosporin allergy of "other". Discussed with MD on rounds. Patient tolerating ceftriaxone. Continue ceftriaxone 1g IV Q24hr pending urine culture results.   Height: 5\' 6"  (167.6 cm) Weight: 127 lb 14.4 oz (58 kg) IBW/kg (Calculated) : 59.3  Temp (24hrs), Avg:98.4 F (36.9 C), Min:98.1 F (36.7 C), Max:98.8 F (37.1 C)   Recent Labs Lab 10/03/16 1803 10/04/16 0500  WBC 10.4 9.0  CREATININE 0.87 0.99    Estimated Creatinine Clearance: 31.8 mL/min (by C-G formula based on SCr of 0.99 mg/dL).    Allergies  Allergen Reactions  . Cephalosporins Other (See Comments)  . Macrolides And Ketolides Other (See Comments)  . Oxycodone Nausea Only  . Ciprofloxacin Swelling, Rash and Other (See Comments)  . Neomy-Bacit-Polymyx-Pramoxine Rash    "All antibiotics"  . Other Rash    "All antibiotics"  . Penicillins Swelling, Rash and Other (See Comments)    Pt is unable to answer additional questions about this medication.    . Prednisone Rash and Other (See Comments)  . Sulfa Antibiotics Swelling, Rash and Other (See Comments)    Antimicrobials this admission: Ceftriaxone 3/6  >>   Dose adjustments this admission:   Microbiology results: 3/6 MRSA PCR: negative 3/6 UCx: pending    Pharmacy will continue to monitor and adjust per consult.   Evelyn [name] 10/04/2016 5:02 PM

## 2016-10-04 NOTE — Plan of Care (Signed)
Problem: Education: Goal: Knowledge of [facility] Education information/materials will improve Outcome: Progressing Given handout about UTI

## 2016-10-04 NOTE — Evaluation (Signed)
Physical Therapy Evaluation Patient Details Name: Evelyn [name] MRN: [id number] DOB: Nov 11, 1919 Today's Date: 10/04/2016   History of Present Illness  Pt presented to ED from [facility] with altered mental status, with likely UTI. Pt's family was not present to determine PLOF and pt was oriented to her name/DOB, why she was in "here" but could not name place or date).  Clinical Impression  Pt is a 85 female admitted to hospital with altered mental status, likely 2/2 UTI, per MD/RN notes. Please see pt's PMH for details. Pt very HOH and reluctant to get OOB at first. However, pt then willing to attempt walking in order to sit up in the chair at end of session. Pt required S during supine to sit to ensure safety and management of IV line. Pt had difficulty following one step commands due to impaired cognition, therefore, PT not able to perform MMT effectively. Pt was able to perform BUE/LE AROM WFL. Pt required S and cues for hand placement during STS with RW and S while ambulating 10' to chair with RW. S and cues required during txfs and amb. 2/2 gait deviations, impaired strength, impaired balance and decr. Safety awareness. Cues to improve stride length and upright posture with RW and to avoid obstacles.  Pt would benefit from skilled PT to address above deficits and promote optimal return to PLOF. PT recommends transition to HHPT, at [facility], upon discharge from acute hospitalization.      Follow Up Recommendations Home health PT;Supervision/Assistance - 24 hour (return to [facility])    Equipment Recommendations       Recommendations for Other Services       Precautions / Restrictions Precautions Precautions: Fall Restrictions Weight Bearing Restrictions: No      Mobility  Bed Mobility Overal bed mobility: Needs Assistance Bed Mobility: Supine to Sit     Supine to sit: Supervision     General bed mobility comments: S to ensure safety and to move IV  line.  Transfers Overall transfer level: Needs assistance Equipment used: Rolling walker (2 wheeled) Transfers: Sit to/from Stand Sit to Stand: Supervision         General transfer comment: S to ensure safety and for hand placement.  Ambulation/Gait Ambulation/Gait assistance: Supervision Ambulation Distance (Feet): 10 Feet Assistive device: Rolling walker (2 wheeled) Gait Pattern/deviations: Decreased stride length;Decreased dorsiflexion - right;Decreased dorsiflexion - left;Step-through pattern;Shuffle     General Gait Details: Cues to improve stride length and upright posture, and to avoid obstacles.   Stairs            Wheelchair Mobility    Modified Rankin (Stroke Patients Only)       Balance Overall balance assessment: Needs assistance Sitting-balance support: Feet unsupported;Bilateral upper extremity supported Sitting balance-Leahy Scale: Good     Standing balance support: Bilateral upper extremity supported Standing balance-Leahy Scale: Fair Standing balance comment: Pt required BUE support on RW to maintain balance during session.                             Pertinent Vitals/Pain Pain Assessment: No/denies pain    Home Living Family/patient expects to be discharged to:: Assisted living [facility])               Home Equipment: Gilford Rile - 2 wheels Additional Comments: per chart and pt - [facility]. No family members present to confirm if pt resides in ALF section or memory care section of [facility].  Prior Function Level of Independence: Independent with assistive device(s)         Comments: per pt she amb. with RW to dining hall and within her room.     Hand Dominance        Extremity/Trunk Assessment   Upper Extremity Assessment Upper Extremity Assessment: Overall WFL for tasks assessed    Lower Extremity Assessment Lower Extremity Assessment: Generalized weakness;Difficult to assess due to  impaired cognition (pt had difficulty following commands during MMT)       Communication   Communication: HOH  Cognition   Behavior During Therapy: Impulsive (slightly impulsive) Overall Cognitive Status: No family/caregiver present to determine baseline cognitive functioning (short-term memory appeared to be impaired) Area of Impairment: Memory;Following commands     Memory: Decreased short-term memory Following Commands: Follows one step commands inconsistently            General Comments      Exercises     Assessment/Plan    PT Assessment Patient needs continued PT services  PT Problem List Decreased strength;Decreased balance;Decreased mobility;Decreased cognition;Decreased knowledge of use of DME;Decreased safety awareness       PT Treatment Interventions Therapeutic activities    PT Goals (Current goals can be found in the Care Plan section)  Acute Rehab PT Goals Patient Stated Goal: "to get out of here" PT Goal Formulation: With patient Time For Goal Achievement: 10/18/16 Potential to Achieve Goals: Good    Frequency Min 2X/week   Barriers to discharge   Pt lives at [place], need to verify with family if pt lives in ALF section or memory care section, as they weren't present during eval. Hassan Rowan, RN informed of pt's mobilty status, pt in chair at end of session, and cognition during PT eval.    Co-evaluation               End of Session Equipment Utilized During Treatment: Gait belt Activity Tolerance: Patient tolerated treatment well Patient left: in chair;with call bell/phone within reach;with chair alarm set (nursing informed when chair alarm set) Nurse Communication: Other (comment);Mobility status (PT spoke with Hassan Rowan re: mobility to chair and HHPT rec) PT Visit Diagnosis: Other abnormalities of gait and mobility (R26.89);Unsteadiness on feet (R26.81)         Time: 0086-7619 PT Time Calculation (min) (ACUTE ONLY): 22 min   Charges:    PT Evaluation $PT Eval Moderate Complexity: 1 Procedure PT Treatments $Therapeutic Activity: 8-22 mins   PT G Codes:         Rajni Holsworth L 10/04/2016, 11:18 AM  Geoffry Paradise, PT,DPT 10/04/16 11:32 AM

## 2016-10-04 NOTE — Progress Notes (Signed)
Pharmacy Antibiotic Note  Evelyn [name] is a 83 y.o. female admitted on 10/03/2016 with UTI.  Pharmacy has been consulted for ceftriaxone dosing.  Plan: Ceftriaxone 2 grams q 24 hours ordered.  Height: 5\' 6"  (167.6 cm) Weight: 125 lb (56.7 kg) IBW/kg (Calculated) : 59.3  Temp (24hrs), Avg:98.8 F (37.1 C), Min:98.8 F (37.1 C), Max:98.8 F (37.1 C)   Recent Labs Lab 10/03/16 1803  WBC 10.4  CREATININE 0.87    Estimated Creatinine Clearance: 35.4 mL/min (by C-G formula based on SCr of 0.87 mg/dL).    Allergies  Allergen Reactions  . Cephalosporins Other (See Comments)  . Macrolides And Ketolides Other (See Comments)  . Oxycodone Nausea Only  . Ciprofloxacin Swelling, Rash and Other (See Comments)  . Neomy-Bacit-Polymyx-Pramoxine Rash    "All antibiotics"  . Other Rash    "All antibiotics"  . Penicillins Swelling, Rash and Other (See Comments)    Pt is unable to answer additional questions about this medication.    . Prednisone Rash and Other (See Comments)  . Sulfa Antibiotics Swelling, Rash and Other (See Comments)    Antimicrobials this admission: Ceftriaxone 3/6  >>    >>   Dose adjustments this admission:   Microbiology results: 3/6 MRSA PCR: pending     3/6 UA: LE(+)  NO2(+)  WBC TNTC  Thank you for allowing pharmacy to be a part of this patient's care.  Nadra Hritz S 10/04/2016 12:44 AM

## 2016-10-04 NOTE — Clinical Social Work Note (Signed)
Clinical Social Work Assessment  Patient Details  Name: Evelyn [name] MRN: [id number] Date of Birth: 07/02/1920  Date of referral:  10/04/16               Reason for consult:  Facility Placement                Permission sought to share information with:  Family Supports Permission granted to share information::  Yes, Verbal Permission Granted  Name::     Evelyn [name]  Relationship::  son   Contact Information:  [phone]  Housing/Transportation Living arrangements for the past 2 months:  [place] of Information:  Adult Children Patient Interpreter Needed:  None Criminal Activity/Legal Involvement Pertinent to Current Situation/Hospitalization:  No - Comment as needed Significant Relationships:  Adult Children Lives with:  Facility Resident Do you feel safe going back to the place where you live?  Yes Need for family participation in patient care:  No (Coment)  Care giving concerns:  No care giving concerns  Social Worker assessment / plan:  CSW met with pt and family to address consult. Pt was admitted with Altered Mental Status in the setting of a UTI from [facility]. CSW introduced herself and explained role of social work. CSW also explained the process of returning to facility. CSW also confirmed that pt PT is recommending home health PT. Pt's son shared that pt has had HHPT in the past and did not participate well. Pt is now getting bills for therapy. Pt's son has declined STR. Pt's son shared that he will discuss pt's disp with his brother. CSW will followed up if they would like HHPT. CSW also left a message with [name]. CSW will continue to follow.   Employment status:  Retired Nurse, adult PT Recommendations:  Home with [place], [place] / Referral to community resources:  Other (Comment Required), [facility])  Patient/Family's Response to care:   Pt's son was appreciative of CSW support.   Patient/Family's Understanding of and Emotional Response to Diagnosis, Current Treatment, and Prognosis:  Pt's son understands that pt will be best suited at home, which is the ALF.   Emotional Assessment Appearance:  Appears stated age Attitude/Demeanor/Rapport:   (Appropriate) Affect (typically observed):  Pleasant Orientation:  Oriented to Self Alcohol / Substance use:  Not Applicable Psych involvement (Current and /or in the community):  No (Comment)  Discharge Needs  Concerns to be addressed:  Adjustment to Illness Readmission within the last 30 days:  No Current discharge risk:  Chronically ill Barriers to Discharge:  Continued Medical Work up   [employer], LCSW 10/04/2016, 4:19 PM

## 2016-10-04 NOTE — Progress Notes (Signed)
[place] at [place] NAME: Evelyn [name]    MR#:  [id number]  DATE OF BIRTH:  11/21/1919  SUBJECTIVE:  CHIEF COMPLAINT:   Chief Complaint  Patient presents with  . Altered Mental Status  Pleasantly confused,  REVIEW OF SYSTEMS:  Review of Systems  Unable to perform ROS: Mental acuity    DRUG ALLERGIES:   Allergies  Allergen Reactions  . Cephalosporins Other (See Comments)  . Macrolides And Ketolides Other (See Comments)  . Oxycodone Nausea Only  . Ciprofloxacin Swelling, Rash and Other (See Comments)  . Neomy-Bacit-Polymyx-Pramoxine Rash    "All antibiotics"  . Other Rash    "All antibiotics"  . Penicillins Swelling, Rash and Other (See Comments)    Pt is unable to answer additional questions about this medication.    . Prednisone Rash and Other (See Comments)  . Sulfa Antibiotics Swelling, Rash and Other (See Comments)   VITALS:  Blood pressure (!) 161/76, pulse 68, temperature 98.1 F (36.7 C), temperature source Oral, resp. rate 20, height 5\' 6"  (1.676 m), weight 58 kg (127 lb 14.4 oz), SpO2 100 %. PHYSICAL EXAMINATION:  Physical Exam  Constitutional: She is well-developed, well-nourished, and in no distress.  HENT:  Head: Normocephalic and atraumatic.  Eyes: Conjunctivae and EOM are normal. Pupils are equal, round, and reactive to light.  Neck: Normal range of motion. Neck supple. No tracheal deviation present. No thyromegaly present.  Cardiovascular: Normal rate, regular rhythm and normal heart sounds.   Pulmonary/Chest: Effort normal and breath sounds normal. No respiratory distress. She has no wheezes. She exhibits no tenderness.  Abdominal: Soft. Bowel sounds are normal. She exhibits no distension. There is no tenderness.  Musculoskeletal: Normal range of motion.  Neurological: She is alert. She is disoriented. No cranial nerve deficit.  Skin: Skin is warm and dry. No rash noted.  Psychiatric: Mood and affect  normal.   confused   LABORATORY PANEL:  Female CBC  Recent Labs Lab 10/04/16 0500  WBC 9.0  HGB 10.3*  HCT 31.0*  PLT 365   ------------------------------------------------------------------------------------------------------------------ Chemistries   Recent Labs Lab 10/03/16 1803 10/04/16 0500  NA 142 142  K 4.1 3.8  CL 107 110  CO2 25 26  GLUCOSE 85 89  BUN 20 21*  CREATININE 0.87 0.99  CALCIUM 9.0 8.3*  AST 21  --   ALT 11*  --   ALKPHOS 71  --   BILITOT 0.5  --    RADIOLOGY:  Ct Head Wo Contrast  Result Date: 10/03/2016 CLINICAL DATA:  Altered mental status for 3 days, concurrent urinary tract infection and incontinence. History of lymphoma. EXAM: CT HEAD WITHOUT CONTRAST TECHNIQUE: Contiguous axial images were obtained from the base of the skull through the vertex without intravenous contrast. COMPARISON:  CT HEAD May 05, 2015 FINDINGS: BRAIN: The ventricles and sulci are normal for age. No intraparenchymal hemorrhage, mass effect nor midline shift. Confluent supratentorial white matter hypodensities. Old small RIGHT cerebellar infarcts. Old bilateral thalamus lacunar infarcts. Old RIGHT basal ganglia lacunar infarct. Acute large vascular territory infarcts. No abnormal extra-axial fluid collections. Basal cisterns are patent. VASCULAR: Moderate calcific atherosclerosis of the carotid siphons. SKULL: No skull fracture. Osteopenia. No significant scalp soft tissue swelling. SINUSES/ORBITS: Severe chronic LEFT maxillary sinusitis with central dense inspissated mucus versus fungal component, mild chronic RIGHT maxillary sinusitis. The included ocular globes and orbital contents are non-suspicious. OTHER: None. IMPRESSION: 1. No acute intracranial process 2. Severe chronic small vessel  ischemic disease. 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 Electronically Signed   By: Elon Alas M.D.   On: 10/03/2016 20:00   ASSESSMENT AND PLAN:  This is a 83 y.o.  female with a history of anemia, lymphoma, coronary artery disease, GERD, hypertension, hyperlipidemia, insomnia, hypothyroidism, chronic diastolic CHF, PAF on Eliquis now being admitted with:  1.  Acute metabolic encephalopathy secondary to urinary tract infection -CT scan of the head negative for any acute pathology -Continue Rocephin -Gentle IV fluids -Follow up urine cultures - Hold off on sedatives right now including Remeron and Ambien  2.  Hypertension -Continue propranolol -We will add Norvasc 5 mg for better blood pressure control  3. History of chronic diastolic CHF - Continue beta blocker -Well compensated at this time   4. History of GERD - Continue Protonix  5.  Anemia of chronic disease Stable H&H  6. History of hyperlipidemia, not currently medicated  7. History of insomnia - Hold off on sedatives right now including Remeron and Ambien  8. Atrial fibrillation -Continue propranolol,Eliquis  9. History of hypothyroidism - Continue Synthroid  10. History of urinary incontinence -Continue to Ditropan     All the records are reviewed and case discussed with Care Management/Social Worker. Management plans discussed with the patient, nursing and they are in agreement.  CODE STATUS: Full Code  TOTAL TIME TAKING CARE OF THIS PATIENT: 35 minutes.   More than 50% of the time was spent in counseling/coordination of care: YES  POSSIBLE D/C IN 1-2 DAYS, DEPENDING ON CLINICAL CONDITION.   Max Sane M.D on 10/04/2016 at 4:33 PM  Between 7am to 6pm - Pager - [phone]  After 6pm go to [email or web address] - Proofreader  [facility]  Office  [phone]  CC: Primary care physician; Crecencio Mc, MD  Note: This dictation was prepared with Dragon dictation along with smaller phrase technology. Any transcriptional errors that result from this process are unintentional.

## 2016-10-05 LAB — CBC
HEMATOCRIT: 32.3 % — AB (ref 35.0–47.0)
Hemoglobin: 10.5 g/dL — ABNORMAL LOW (ref 12.0–16.0)
MCH: 27.5 pg (ref 26.0–34.0)
MCHC: 32.4 g/dL (ref 32.0–36.0)
MCV: 84.8 fL (ref 80.0–100.0)
PLATELETS: 340 10*3/uL (ref 150–440)
RBC: 3.81 MIL/uL (ref 3.80–5.20)
RDW: 16.5 % — AB (ref 11.5–14.5)
WBC: 8.1 10*3/uL (ref 3.6–11.0)

## 2016-10-05 LAB — BASIC METABOLIC PANEL
ANION GAP: 4 — AB (ref 5–15)
BUN: 15 mg/dL (ref 6–20)
CO2: 27 mmol/L (ref 22–32)
Calcium: 8.4 mg/dL — ABNORMAL LOW (ref 8.9–10.3)
Chloride: 105 mmol/L (ref 101–111)
Creatinine, Ser: 1.03 mg/dL — ABNORMAL HIGH (ref 0.44–1.00)
GFR calc Af Amer: 52 mL/min — ABNORMAL LOW (ref >60)
GFR calc non Af Amer: 45 mL/min — ABNORMAL LOW (ref >60)
GLUCOSE: 104 mg/dL — AB (ref 65–99)
POTASSIUM: 3.8 mmol/L (ref 3.5–5.1)
Sodium: 136 mmol/L (ref 135–145)

## 2016-10-05 MED ORDER — AMLODIPINE BESYLATE 10 MG PO TABS
10.0000 mg | ORAL_TABLET | Freq: Every day | ORAL | Status: DC
  Administered 2016-10-05 – 2016-10-06 (×2): 10 mg via ORAL
  Filled 2016-10-05 (×2): qty 1

## 2016-10-05 NOTE — Clinical Social Work Note (Signed)
CSW spoke with Lattie Haw the Associate Professor at [facility]. Pt is able to return when stable. Pt will likely be discharged tomorrow, per MD and will not have any follow up at the family request. Facility is checking to see if transportation is able to be provided. CSW will continue to follow.   Darden Dates, MSW, LCSW Clinical Social Worker  [phone]

## 2016-10-05 NOTE — Progress Notes (Signed)
Physical Therapy Treatment Patient Details Name: Evelyn [name] MRN: [id number] DOB: 1920/06/02 Today's Date: 10/05/2016    History of Present Illness Pt presented to ED from [facility] with altered mental status, with likely UTI. Pt's family was not present to determine PLOF and pt was oriented to her name/DOB, why she was in "here" but could not name place or date).    PT Comments    Pt in chair.  C/O some dizziness upon standing but cleared with time.  Pt was able to amb to door and back in room x 2 with walker and supervision.  Overall tolerated gait well and was able to navigate tight spaces well.  Minimal verbal cues for safety.  No LOB noted or buckling.   Follow Up Recommendations  Home health PT;Supervision/Assistance - 24 hour     Equipment Recommendations       Recommendations for Other Services       Precautions / Restrictions Precautions Precautions: Fall Restrictions Weight Bearing Restrictions: No    Mobility  Bed Mobility               General bed mobility comments: in chair  Transfers Overall transfer level: Needs assistance Equipment used: Rolling walker (2 wheeled) Transfers: Sit to/from Stand Sit to Stand: Supervision         General transfer comment: vc's for hand placement at times  Ambulation/Gait Ambulation/Gait assistance: Supervision Ambulation Distance (Feet): 20 Feet Assistive device: Rolling walker (2 wheeled) Gait Pattern/deviations: Step-through pattern   Gait velocity interpretation: Below normal speed for age/gender General Gait Details: 20 ' x 2  - in room to door and back with short seated rest   Stairs            Wheelchair Mobility    Modified Rankin (Stroke Patients Only)       Balance Overall balance assessment: Needs assistance Sitting-balance support: Feet unsupported;Bilateral upper extremity supported Sitting balance-Leahy Scale: Good     Standing balance support: Bilateral upper  extremity supported Standing balance-Leahy Scale: Fair Standing balance comment: Pt required BUE support on RW to maintain balance during session.                    Cognition Arousal/Alertness: Awake/alert Behavior During Therapy: WFL for tasks assessed/performed Overall Cognitive Status: Within Functional Limits for tasks assessed                      Exercises      General Comments        Pertinent Vitals/Pain Pain Assessment: No/denies pain    Home Living                      Prior Function            PT Goals (current goals can now be found in the care plan section) Progress towards PT goals: Progressing toward goals    Frequency    Min 2X/week      PT Plan      Co-evaluation             End of Session Equipment Utilized During Treatment: Gait belt Activity Tolerance: Patient tolerated treatment well Patient left: in chair;with call bell/phone within reach;with chair alarm set         Time: 5366-4403 PT Time Calculation (min) (ACUTE ONLY): 17 min  Charges:  $Gait Training: 8-22 mins  G Codes:       Chesley Noon 10/05/2016, 12:08 PM

## 2016-10-05 NOTE — Progress Notes (Signed)
Evelyn [name] at [place] NAME: Evelyn [name]    MR#:  [id number]  DATE OF BIRTH:  June 20, 1920  SUBJECTIVE:  CHIEF COMPLAINT:   Chief Complaint  Patient presents with  . Altered Mental Status  Pleasantly confused, no new issues REVIEW OF SYSTEMS:  Review of Systems  Unable to perform ROS: Mental acuity   DRUG ALLERGIES:   Allergies  Allergen Reactions  . Cephalosporins Other (See Comments)  . Macrolides And Ketolides Other (See Comments)  . Oxycodone Nausea Only  . Ciprofloxacin Swelling, Rash and Other (See Comments)  . Neomy-Bacit-Polymyx-Pramoxine Rash    "All antibiotics"  . Other Rash    "All antibiotics"  . Penicillins Swelling, Rash and Other (See Comments)    Pt is unable to answer additional questions about this medication.    . Prednisone Rash and Other (See Comments)  . Sulfa Antibiotics Swelling, Rash and Other (See Comments)   VITALS:  Blood pressure (!) 151/68, pulse 74, temperature 98.6 F (37 C), resp. rate 20, height 5\' 6"  (1.676 m), weight 58 kg (127 lb 14.4 oz), SpO2 98 %. PHYSICAL EXAMINATION:  Physical Exam  Constitutional: She is well-developed, well-nourished, and in no distress.  HENT:  Head: Normocephalic and atraumatic.  Eyes: Conjunctivae and EOM are normal. Pupils are equal, round, and reactive to light.  Neck: Normal range of motion. Neck supple. No tracheal deviation present. No thyromegaly present.  Cardiovascular: Normal rate, regular rhythm and normal heart sounds.   Pulmonary/Chest: Effort normal and breath sounds normal. No respiratory distress. She has no wheezes. She exhibits no tenderness.  Abdominal: Soft. Bowel sounds are normal. She exhibits no distension. There is no tenderness.  Musculoskeletal: Normal range of motion.  Neurological: She is alert. She is disoriented. No cranial nerve deficit.  Skin: Skin is warm and dry. No rash noted.  Psychiatric: Mood and affect normal.   confused   LABORATORY PANEL:  Female CBC  Recent Labs Lab 10/05/16 0452  WBC 8.1  HGB 10.5*  HCT 32.3*  PLT 340   ------------------------------------------------------------------------------------------------------------------ Chemistries   Recent Labs Lab 10/03/16 1803  10/05/16 0452  NA 142  < > 136  K 4.1  < > 3.8  CL 107  < > 105  CO2 25  < > 27  GLUCOSE 85  < > 104*  BUN 20  < > 15  CREATININE 0.87  < > 1.03*  CALCIUM 9.0  < > 8.4*  AST 21  --   --   ALT 11*  --   --   ALKPHOS 71  --   --   BILITOT 0.5  --   --   < > = values in this interval not displayed. RADIOLOGY:  No results found. ASSESSMENT AND PLAN:  This is a 83 y.o. female with a history of anemia, lymphoma, coronary artery disease, GERD, hypertension, hyperlipidemia, insomnia, hypothyroidism, chronic diastolic CHF, PAF on Eliquis now being admitted with:  1.  Acute metabolic encephalopathy secondary to urinary tract infection -CT scan of the head negative for any acute pathology -Continue Rocephin. -Can switch her back to oral Keflex tomorrow -Gentle IV fluids -Follow up urine cultures - Hold off on sedatives right now including Remeron and Ambien  2.  Hypertension -Continue propranolol -We will increase the dose of Norvasc to 10 mg considering her elevated blood pressure   3. History of chronic diastolic CHF - Continue beta blocker -Well compensated at this time   4.  History of GERD - Continue Protonix  5.  Anemia of chronic disease Stable H&H  6. History of hyperlipidemia, not currently medicated  7. History of insomnia - Hold off on sedatives right now including Remeron and Ambien  8. Atrial fibrillation -Continue propranolol,Eliquis  9. History of hypothyroidism - Continue Synthroid  10. History of urinary incontinence -Continue to Ditropan   I have discussed her care with patient's son [name].  He is in agreement with taking her back to her senior living facility  tomorrow at 1:30.  He does not feel she would participate in physical therapy.  Neither he has any interest in any kind of rehab which I am in agreement.  All the records are reviewed and case discussed with Care Management/Social Worker. Management plans discussed with the patient, nursing and they are in agreement.  CODE STATUS: Full Code  TOTAL TIME TAKING CARE OF THIS PATIENT: 35 minutes.   More than 50% of the time was spent in counseling/coordination of care: YES  POSSIBLE D/C IN 1 DAYS, DEPENDING ON CLINICAL CONDITION.   Max Sane M.D on 10/05/2016 at 10:23 AM  Between 7am to 6pm - Pager - [phone]  After 6pm go to [email or web address] - Proofreader  [facility]  Office  [phone]  CC: Primary care physician; Crecencio Mc, MD  Note: This dictation was prepared with Dragon dictation along with smaller phrase technology. Any transcriptional errors that result from this process are unintentional.

## 2016-10-05 NOTE — Care Management Important Message (Signed)
Important Message  Patient Details  Name: Evelyn [name] MRN: [id number] Date of Birth: 1919/08/16   Medicare Important Message Given:  Yes    Shelbie Ammons, RN 10/05/2016, 8:58 AM

## 2016-10-06 ENCOUNTER — Telehealth: Payer: Self-pay | Admitting: *Deleted

## 2016-10-06 MED ORDER — CEPHALEXIN 250 MG PO CAPS: 250.0000 mg | ORAL_CAPSULE | Freq: Two times a day (BID) | ORAL | 0 refills | Status: DC

## 2016-10-06 MED ORDER — AMLODIPINE BESYLATE 10 MG PO TABS: 10.0000 mg | ORAL_TABLET | Freq: Every day | ORAL | 0 refills | Status: DC

## 2016-10-06 NOTE — Clinical Social Work Note (Signed)
Pt is ready to return to facility, [facility]. Facility is ready to admit pt as they have received discharge information. Pt and pt's son is in agreement with discharge plan. Pt's son will provide transportation to facility. CSW is signing off as no further needs identified.   Darden Dates, MSW, LCSW  Clinical Social Worker  [phone]

## 2016-10-06 NOTE — NC FL2 (Signed)
[place] LEVEL OF CARE SCREENING TOOL     IDENTIFICATION  Patient Name: Evelyn [name] Birthdate: 26-Jan-1920 Sex: female Admission Date (Current Location): 10/03/2016  [facility] and [place] Number:  Engineering geologist and Address:  [facility], [address], [place], [place] [address]      Provider Number: [id number]  Attending Physician Name and Address:  Max Sane, MD  Relative Name and Phone Number:       Current Level of Care: Hospital Recommended Level of Care: [place] Prior Approval Number:    Date Approved/Denied:   PASRR Number:    Discharge Plan: Domiciliary (Rest home)    Current Diagnoses: Patient Active Problem List   Diagnosis Date Noted  . Altered mental status 10/03/2016  . Diaper dermatitis 08/16/2016  . Decubitus ulcer of sacral region, stage 1 08/14/2016  . Urinary incontinence 06/07/2016  . Encounter for Medicare annual wellness exam 05/28/2016  . Inflamed external hemorrhoid 05/28/2016  . Bilateral hearing loss due to cerumen impaction 05/26/2016  . Viral URI with cough 05/14/2016  . Aortic stenosis, mild 05/14/2016  . Skin benign neoplasm 01/26/2016  . Anorexia 06/22/2015  . Chronic diastolic heart failure ([place]) 03/04/2015  . Paroxysmal atrial fibrillation ([place]) 05/29/2014  . Diarrhea 12/18/2013  . ESBL (extended spectrum beta-lactamase) producing bacteria infection 12/09/2013  . Unspecified vitamin D deficiency 11/25/2013  . Chronic insomnia 11/23/2013  . Bradycardia 04/10/2013  . Generalized muscle weakness 04/10/2013  . Anemia, iron deficiency 12/27/2012  . Anemia, pernicious 10/15/2011  . Spinal stenosis of lumbar region 10/15/2011  . Hypertension   . Hyperlipidemia   . Hypothyroidism   . Cystitis, chronic   . History of lymphoma     Orientation RESPIRATION BLADDER Height & Weight     Self, Place  Normal Incontinent Weight: 127 lb 14.4 oz (58 kg) Height:  5\' 6"   (167.6 cm)  BEHAVIORAL SYMPTOMS/MOOD NEUROLOGICAL BOWEL NUTRITION STATUS      Continent Diet (Regular Diet, Thin Liquids)  AMBULATORY STATUS COMMUNICATION OF NEEDS Skin   Limited Assist Verbally Normal                       Personal Care Assistance Level of Assistance  Bathing, Feeding, Dressing Bathing Assistance: Limited assistance Feeding assistance: Independent Dressing Assistance: Limited assistance     Functional Limitations Info  Sight, Hearing, Speech Sight Info: Adequate Hearing Info: Adequate Speech Info: Adequate    SPECIAL CARE FACTORS FREQUENCY                       Contractures Contractures Info: Not present    Additional Factors Info  Code Status, Allergies Code Status Info: Full Code Allergies Info: Cephalosporins, Macrolides And Ketolides, Oxycodone, Ciprofloxacin, Neomy-bacit-polymyx-pramoxine, Other, Penicillins, Prednisone, Sulfa Antibiotics           Discharge Medications: Please see discharge summary for a list of discharge medications. Medication List    STOP taking these medications   amoxicillin-clavulanate 1000-62.5 MG 12 hr tablet Commonly known as:  AUGMENTIN XR   predniSONE 10 MG tablet Commonly known as:  DELTASONE     TAKE these medications   acetaminophen 325 MG tablet Commonly known as:  TYLENOL Take 650 mg by mouth every 8 (eight) hours as needed.   amLODipine 10 MG tablet Commonly known as:  NORVASC Take 1 tablet (10 mg total) by mouth daily.   cephALEXin 250 MG capsule Commonly known as:  KEFLEX Take 1 capsule (250 mg total) by mouth 2 (two) times daily.   conjugated estrogens vaginal cream Commonly known as:  PREMARIN Place 1 Applicatorful vaginally every Monday, Wednesday, and Friday.   Cranberry 250 MG Tabs Take 1 tablet (250 mg total) by mouth 2 (two) times daily.   DELSYM 30 MG/5ML liquid Generic drug:  dextromethorphan Take 30 mg by mouth every 12 (twelve) hours as needed for cough.    dibucaine 1 % ointment Commonly known as:  NUPERCAINAL Apply topically 3 (three) times daily as needed for pain. From henmorrhoids   diphenhydrAMINE 50 MG capsule Commonly known as:  BENADRYL Take 50 mg by mouth at bedtime as needed.   ELIQUIS 2.5 MG Tabs tablet Generic drug:  apixaban Take 2.5 mg by mouth 2 (two) times daily.   fesoterodine 4 MG Tb24 tablet Commonly known as:  TOVIAZ Take 1 tablet (4 mg total) by mouth daily.   hydrocortisone 2.5 % rectal cream Commonly known as:  ANUSOL-HC Place 1 application rectally 2 (two) times daily.   hydrocortisone 25 MG suppository Commonly known as:  ANUSOL-HC Place 1 suppository (25 mg total) rectally 2 (two) times daily.   levothyroxine 100 MCG tablet Commonly known as:  SYNTHROID, LEVOTHROID Take 100 mcg by mouth daily.   loperamide 2 MG tablet Commonly known as:  IMODIUM A-D Take 4 mg by mouth 4 (four) times daily as needed for diarrhea or loose stools.   mirtazapine 7.5 MG tablet Commonly known as:  REMERON Take 1 tablet (7.5 mg total) by mouth at bedtime.   multivitamin with minerals tablet Take 1 tablet by mouth daily.   omeprazole 20 MG capsule Commonly known as:  PRILOSEC Take 20 mg by mouth daily.   PROBIOTIC PO Take by mouth.   propranolol 20 MG tablet Commonly known as:  INDERAL Take 1.5 tablets (30 mg total) by mouth 2 (two) times daily.   tolterodine 2 MG tablet Commonly known as:  DETROL Take 4 mg by mouth daily.   triamcinolone cream 0.1 % Commonly known as:  KENALOG Apply 1 application topically 2 (two) times daily. To irritated area   vitamin B-12 1000 MCG tablet Commonly known as:  CYANOCOBALAMIN Take 1,000 mcg by mouth daily.   zolpidem 5 MG tablet Commonly known as:  AMBIEN Take 5 mg by mouth at bedtime as needed for sleep.     Relevant Imaging Results:  Relevant Lab Results:   Additional Information SSN:  [id number]  Darden Dates, LCSW

## 2016-10-06 NOTE — Telephone Encounter (Signed)
Patient will discharge from [facility] on 10/06/16. Patient will need a HFU scheduled.  Contact [phone]

## 2016-10-06 NOTE — Care Management (Signed)
Physical therapy evaluation completed. Recommending home health physical therapy 24 hour supervision.  Refusing therapy at this time. A resident of [facility]. Discharge today per Dr. Manuella Ghazi. Shelbie Ammons RN MSN CCM Care Management

## 2016-10-06 NOTE — Discharge Summary (Addendum)
[facility] NAME: Evelyn [name]    MR#:  [id number]  DATE OF BIRTH:  14-May-1920  DATE OF ADMISSION:  10/03/2016   ADMITTING PHYSICIAN: Harvie Bridge, DO  DATE OF DISCHARGE: 10/06/2016  PRIMARY CARE PHYSICIAN: Crecencio Mc, MD   ADMISSION DIAGNOSIS:  Acute cystitis without hematuria [N30.00] Altered mental status, unspecified altered mental status type [R41.82] DISCHARGE DIAGNOSIS:  Active Problems:   Altered mental status  SECONDARY DIAGNOSIS:   Past Medical History:  Diagnosis Date  . Anemia    hx of blood transfusion  . Cancer ([place])    lymphomia  . Coarse tremors    head shakes if does not take propranolol  . Coronary artery disease   . GERD (gastroesophageal reflux disease)   . History of lymphoma   . Hyperlipidemia   . Hypertension   . Insomnia    takes remeron nightly  . Other chronic cystitis    Radiation , botox injections  . Self-catheterizes urinary bladder    twice daily  . Thyroid disease    had thyroid removed   HOSPITAL COURSE:  This is a 83 y.o.femalewith a history of anemia, lymphoma, coronary artery disease, GERD, hypertension, hyperlipidemia, insomnia, hypothyroidism, chronic diastolic CHF, PAF on Eliquisadmitted with:  1.  Acute metabolic encephalopathy secondary to urinary tract infection -CT scan of the head negative for any acute pathology -Treated with IV Rocephin. -Being discharged on oral Keflex to finish course of antibiotics -Urine culture growing KLEBSIELLA PNEUMONIAE  * KLEBSIELLA PNEUMONIAE UTI - treated  2.  Hypertension -Continue propranolol -Added Norvasc for better blood pressure control  3. History of chronic diastolic CHF - Continue beta blocker -Well compensated at this time   4. History of GERD - Continue Protonix  5.  Anemia of chronic disease Stable H&H  6. History of hyperlipidemia, not currently medicated  7. History of insomnia -  Hold off on sedatives right now including Remeron and Ambien  8. Atrial fibrillation -Continue propranolol,Eliquis  9. History of hypothyroidism - Continue Synthroid  10. History of urinary incontinence -Continue to Ditropan  DISCHARGE CONDITIONS:  Stable CONSULTS OBTAINED:   DRUG ALLERGIES:   Allergies  Allergen Reactions  . Cephalosporins Other (See Comments)  . Macrolides And Ketolides Other (See Comments)  . Oxycodone Nausea Only  . Ciprofloxacin Swelling, Rash and Other (See Comments)  . Neomy-Bacit-Polymyx-Pramoxine Rash    "All antibiotics"  . Other Rash    "All antibiotics"  . Penicillins Swelling, Rash and Other (See Comments)    Pt is unable to answer additional questions about this medication.    . Prednisone Rash and Other (See Comments)  . Sulfa Antibiotics Swelling, Rash and Other (See Comments)   DISCHARGE MEDICATIONS:   Allergies as of 10/06/2016      Reactions   Cephalosporins Other (See Comments)   Macrolides And Ketolides Other (See Comments)   Oxycodone Nausea Only   Ciprofloxacin Swelling, Rash, Other (See Comments)   Neomy-bacit-polymyx-pramoxine Rash   "All antibiotics"   Other Rash   "All antibiotics"   Penicillins Swelling, Rash, Other (See Comments)   Pt is unable to answer additional questions about this medication.     Prednisone Rash, Other (See Comments)   Sulfa Antibiotics Swelling, Rash, Other (See Comments)      Medication List    STOP taking these medications   amoxicillin-clavulanate 1000-62.5 MG 12 hr tablet Commonly known as:  AUGMENTIN XR   predniSONE 10 MG  tablet Commonly known as:  DELTASONE     TAKE these medications   acetaminophen 325 MG tablet Commonly known as:  TYLENOL Take 650 mg by mouth every 8 (eight) hours as needed.   amLODipine 10 MG tablet Commonly known as:  NORVASC Take 1 tablet (10 mg total) by mouth daily.   cephALEXin 250 MG capsule Commonly known as:  KEFLEX Take 1 capsule (250 mg  total) by mouth 2 (two) times daily.   conjugated estrogens vaginal cream Commonly known as:  PREMARIN Place 1 Applicatorful vaginally every Monday, Wednesday, and Friday.   Cranberry 250 MG Tabs Take 1 tablet (250 mg total) by mouth 2 (two) times daily.   DELSYM 30 MG/5ML liquid Generic drug:  dextromethorphan Take 30 mg by mouth every 12 (twelve) hours as needed for cough.   dibucaine 1 % ointment Commonly known as:  NUPERCAINAL Apply topically 3 (three) times daily as needed for pain. From henmorrhoids   diphenhydrAMINE 50 MG capsule Commonly known as:  BENADRYL Take 50 mg by mouth at bedtime as needed.   ELIQUIS 2.5 MG Tabs tablet Generic drug:  apixaban Take 2.5 mg by mouth 2 (two) times daily.   fesoterodine 4 MG Tb24 tablet Commonly known as:  TOVIAZ Take 1 tablet (4 mg total) by mouth daily.   hydrocortisone 2.5 % rectal cream Commonly known as:  ANUSOL-HC Place 1 application rectally 2 (two) times daily.   hydrocortisone 25 MG suppository Commonly known as:  ANUSOL-HC Place 1 suppository (25 mg total) rectally 2 (two) times daily.   levothyroxine 100 MCG tablet Commonly known as:  SYNTHROID, LEVOTHROID Take 100 mcg by mouth daily.   loperamide 2 MG tablet Commonly known as:  IMODIUM A-D Take 4 mg by mouth 4 (four) times daily as needed for diarrhea or loose stools.   mirtazapine 7.5 MG tablet Commonly known as:  REMERON Take 1 tablet (7.5 mg total) by mouth at bedtime.   multivitamin with minerals tablet Take 1 tablet by mouth daily.   omeprazole 20 MG capsule Commonly known as:  PRILOSEC Take 20 mg by mouth daily.   PROBIOTIC PO Take by mouth.   propranolol 20 MG tablet Commonly known as:  INDERAL Take 1.5 tablets (30 mg total) by mouth 2 (two) times daily.   tolterodine 2 MG tablet Commonly known as:  DETROL Take 4 mg by mouth daily.   triamcinolone cream 0.1 % Commonly known as:  KENALOG Apply 1 application topically 2 (two) times  daily. To irritated area   vitamin B-12 1000 MCG tablet Commonly known as:  CYANOCOBALAMIN Take 1,000 mcg by mouth daily.   zolpidem 5 MG tablet Commonly known as:  AMBIEN Take 5 mg by mouth at bedtime as needed for sleep.        DISCHARGE INSTRUCTIONS:   DIET:  Regular diet DISCHARGE CONDITION:  Good ACTIVITY:  Activity as tolerated OXYGEN:  Home Oxygen: No.  Oxygen Delivery: room air DISCHARGE LOCATION:  [facility] -with home health physical therapy nursing.  Palliative care to follow  If you experience worsening of your admission symptoms, develop shortness of breath, life threatening emergency, suicidal or homicidal thoughts you must seek medical attention immediately by calling 911 or calling your MD immediately  if symptoms less severe.  You Must read complete instructions/literature along with all the possible adverse reactions/side effects for all the Medicines you take and that have been prescribed to you. Take any new Medicines after you have completely understood and  accpet all the possible adverse reactions/side effects.   Please note  You were cared for by a hospitalist during your hospital stay. If you have any questions about your discharge medications or the care you received while you were in the hospital after you are discharged, you can call the unit and asked to speak with the hospitalist on call if the hospitalist that took care of you is not available. Once you are discharged, your primary care physician will handle any further medical issues. Please note that NO REFILLS for any discharge medications will be authorized once you are discharged, as it is imperative that you return to your primary care physician (or establish a relationship with a primary care physician if you do not have one) for your aftercare needs so that they can reassess your need for medications and monitor your lab values.    On the day of Discharge:  VITAL  SIGNS:  Blood pressure (!) 160/60, pulse 67, temperature 98.4 F (36.9 C), temperature source Oral, resp. rate 18, height 5\' 6"  (1.676 m), weight 58 kg (127 lb 14.4 oz), SpO2 97 %. PHYSICAL EXAMINATION:  GENERAL:  83 y.o.-year-old patient lying in the bed with no acute distress.  EYES: Pupils equal, round, reactive to light and accommodation. No scleral icterus. Extraocular muscles intact.  HEENT: Head atraumatic, normocephalic. Oropharynx and nasopharynx clear.  NECK:  Supple, no jugular venous distention. No thyroid enlargement, no tenderness.  LUNGS: Normal breath sounds bilaterally, no wheezing, rales,rhonchi or crepitation. No use of accessory muscles of respiration.  CARDIOVASCULAR: S1, S2 normal. No murmurs, rubs, or gallops.  ABDOMEN: Soft, non-tender, non-distended. Bowel sounds present. No organomegaly or mass.  EXTREMITIES: No pedal edema, cyanosis, or clubbing.  NEUROLOGIC: Cranial nerves II through XII are intact. Muscle strength 5/5 in all extremities. Sensation intact. Gait not checked.  PSYCHIATRIC: The patient is alert and pleasantly confused SKIN: No obvious rash, lesion, or ulcer.  DATA REVIEW:   CBC  Recent Labs Lab 10/05/16 0452  WBC 8.1  HGB 10.5*  HCT 32.3*  PLT 340    Chemistries   Recent Labs Lab 10/03/16 1803  10/05/16 0452  NA 142  < > 136  K 4.1  < > 3.8  CL 107  < > 105  CO2 25  < > 27  GLUCOSE 85  < > 104*  BUN 20  < > 15  CREATININE 0.87  < > 1.03*  CALCIUM 9.0  < > 8.4*  AST 21  --   --   ALT 11*  --   --   ALKPHOS 71  --   --   BILITOT 0.5  --   --   < > = values in this interval not displayed.   Microbiology Results  Results for orders placed or performed during the hospital encounter of 10/03/16  Urine culture     Status: Abnormal (Preliminary result)   Collection Time: 10/03/16  7:01 PM  Result Value Ref Range Status   Specimen Description URINE, RANDOM  Final   Special Requests NONE  Final   Culture (A)  Final    >=100,000  COLONIES/mL KLEBSIELLA PNEUMONIAE SUSCEPTIBILITIES TO FOLLOW Performed at [facility], [address], [place], [place] [address]    Report Status PENDING  Incomplete  MRSA PCR Screening     Status: None   Collection Time: 10/03/16 11:50 PM  Result Value Ref Range Status   MRSA by PCR NEGATIVE NEGATIVE Final    Comment:  The GeneXpert MRSA Assay (FDA approved for NASAL specimens only), is one component of a comprehensive MRSA colonization surveillance program. It is not intended to diagnose MRSA infection nor to guide or monitor treatment for MRSA infections.     RADIOLOGY:  No results found.   Management plans discussed with the patient, family and they are in agreement.  CODE STATUS: Full Code   TOTAL TIME TAKING CARE OF THIS PATIENT: 45 minutes.    Max Sane M.D on 10/06/2016 at 10:11 AM  Between 7am to 6pm - Pager - [phone]  After 6pm go to [email or web address] - Proofreader  [facility]  Office  [phone]  CC: Primary care physician; Crecencio Mc, MD   Note: This dictation was prepared with Dragon dictation along with smaller phrase technology. Any transcriptional errors that result from this process are unintentional.

## 2016-10-06 NOTE — Clinical Social Work Note (Signed)
CSW attempted to call patient's sons Terez Montee [phone] and Anjalina Bergevin [phone] and [phone] to find out if they will transport patient or if EMS will need to transport patient to facility.  Left messages for them to call unit social worker.  Jones Broom. [place], MSW, [place]  10/06/2016 1:03 PM

## 2016-10-06 NOTE — NC FL2 (Deleted)
[place] LEVEL OF CARE SCREENING TOOL     IDENTIFICATION  Patient Name: Evelyn [name] Birthdate: 11/04/1919 Sex: female Admission Date (Current Location): 10/03/2016  [facility] and [place] Number:  Engineering geologist and Address:  [facility], [address] [place], [place] [address]      Provider Number: [id number]  Attending Physician Name and Address:  Max Sane, MD  Relative Name and Phone Number:       Current Level of Care: Hospital Recommended Level of Care: [place] Prior Approval Number:    Date Approved/Denied:   PASRR Number:    Discharge Plan: SNF    Current Diagnoses: Patient Active Problem List   Diagnosis Date Noted  . Altered mental status 10/03/2016  . Diaper dermatitis 08/16/2016  . Decubitus ulcer of sacral region, stage 1 08/14/2016  . Urinary incontinence 06/07/2016  . Encounter for Medicare annual wellness exam 05/28/2016  . Inflamed external hemorrhoid 05/28/2016  . Bilateral hearing loss due to cerumen impaction 05/26/2016  . Viral URI with cough 05/14/2016  . Aortic stenosis, mild 05/14/2016  . Skin benign neoplasm 01/26/2016  . Anorexia 06/22/2015  . Chronic diastolic heart failure ([place]) 03/04/2015  . Paroxysmal atrial fibrillation ([place]) 05/29/2014  . Diarrhea 12/18/2013  . ESBL (extended spectrum beta-lactamase) producing bacteria infection 12/09/2013  . Unspecified vitamin D deficiency 11/25/2013  . Chronic insomnia 11/23/2013  . Bradycardia 04/10/2013  . Generalized muscle weakness 04/10/2013  . Anemia, iron deficiency 12/27/2012  . Anemia, pernicious 10/15/2011  . Spinal stenosis of lumbar region 10/15/2011  . Hypertension   . Hyperlipidemia   . Hypothyroidism   . Cystitis, chronic   . History of lymphoma     Orientation RESPIRATION BLADDER Height & Weight     Self, Place  Normal Incontinent Weight: 127 lb 14.4 oz (58 kg) Height:  5\' 6"  (167.6 cm)  BEHAVIORAL  SYMPTOMS/MOOD NEUROLOGICAL BOWEL NUTRITION STATUS      Continent Regular Diet, Thin Liquids  AMBULATORY STATUS COMMUNICATION OF NEEDS Skin   Limited Assist Verbally Normal                       Personal Care Assistance Level of Assistance  Bathing, Feeding, Dressing Bathing Assistance: Limited assistance Feeding assistance: Independent Dressing Assistance: Limited assistance     Functional Limitations Info  Sight, Hearing, Speech Sight Info: Adequate Hearing Info: Adequate Speech Info: Adequate    SPECIAL CARE FACTORS FREQUENCY                       Contractures Contractures Info: Not present    Additional Factors Info  Code Status, Allergies Code Status Info: Full Code Allergies Info:  Cephalosporins, Macrolides And Ketolides, Oxycodone, Ciprofloxacin, Neomy-bacit-polymyx-pramoxine, Other, Penicillins, Prednisone, Sulfa Antibiotics           Discharge Medications: Please see discharge summary for a list of discharge medications. Medication List    STOP taking these medications   amoxicillin-clavulanate 1000-62.5 MG 12 hr tablet Commonly known as:  AUGMENTIN XR   predniSONE 10 MG tablet Commonly known as:  DELTASONE     TAKE these medications   acetaminophen 325 MG tablet Commonly known as:  TYLENOL Take 650 mg by mouth every 8 (eight) hours as needed.   amLODipine 10 MG tablet Commonly known as:  NORVASC Take 1 tablet (10 mg total) by mouth daily.   cephALEXin 250 MG capsule Commonly known as:  KEFLEX Take  1 capsule (250 mg total) by mouth 2 (two) times daily.   conjugated estrogens vaginal cream Commonly known as:  PREMARIN Place 1 Applicatorful vaginally every Monday, Wednesday, and Friday.   Cranberry 250 MG Tabs Take 1 tablet (250 mg total) by mouth 2 (two) times daily.   DELSYM 30 MG/5ML liquid Generic drug:  dextromethorphan Take 30 mg by mouth every 12 (twelve) hours as needed for cough.   dibucaine 1 %  ointment Commonly known as:  NUPERCAINAL Apply topically 3 (three) times daily as needed for pain. From henmorrhoids   diphenhydrAMINE 50 MG capsule Commonly known as:  BENADRYL Take 50 mg by mouth at bedtime as needed.   ELIQUIS 2.5 MG Tabs tablet Generic drug:  apixaban Take 2.5 mg by mouth 2 (two) times daily.   fesoterodine 4 MG Tb24 tablet Commonly known as:  TOVIAZ Take 1 tablet (4 mg total) by mouth daily.   hydrocortisone 2.5 % rectal cream Commonly known as:  ANUSOL-HC Place 1 application rectally 2 (two) times daily.   hydrocortisone 25 MG suppository Commonly known as:  ANUSOL-HC Place 1 suppository (25 mg total) rectally 2 (two) times daily.   levothyroxine 100 MCG tablet Commonly known as:  SYNTHROID, LEVOTHROID Take 100 mcg by mouth daily.   loperamide 2 MG tablet Commonly known as:  IMODIUM A-D Take 4 mg by mouth 4 (four) times daily as needed for diarrhea or loose stools.   mirtazapine 7.5 MG tablet Commonly known as:  REMERON Take 1 tablet (7.5 mg total) by mouth at bedtime.   multivitamin with minerals tablet Take 1 tablet by mouth daily.   omeprazole 20 MG capsule Commonly known as:  PRILOSEC Take 20 mg by mouth daily.   PROBIOTIC PO Take by mouth.   propranolol 20 MG tablet Commonly known as:  INDERAL Take 1.5 tablets (30 mg total) by mouth 2 (two) times daily.   tolterodine 2 MG tablet Commonly known as:  DETROL Take 4 mg by mouth daily.   triamcinolone cream 0.1 % Commonly known as:  KENALOG Apply 1 application topically 2 (two) times daily. To irritated area   vitamin B-12 1000 MCG tablet Commonly known as:  CYANOCOBALAMIN Take 1,000 mcg by mouth daily.   zolpidem 5 MG tablet Commonly known as:  AMBIEN Take 5 mg by mouth at bedtime as needed for sleep.      Relevant Imaging Results:  Relevant Lab Results:   Additional Information SSN:  [id number]  Darden Dates, LCSW

## 2016-10-07 ENCOUNTER — Telehealth: Payer: Self-pay | Admitting: Family Medicine

## 2016-10-07 LAB — URINE CULTURE: Culture: >=100000 — AB

## 2016-10-07 NOTE — Telephone Encounter (Signed)
Received call from team health. [facility] calling regarding the patient. She was recently admitted for UTI and was discharged on Keflex. Her allergy list reflects an allergy. However, she was given IV Rocephin in the hospital without any trouble. The EMR states that she is essentially allergic to all antibiotics. Given the fact that she tolerated IV Rocephin during admission and the fact that her reported allergies are mild at best, I urged them to proceed with the prescribed medication.

## 2016-10-09 ENCOUNTER — Telehealth: Payer: Self-pay | Admitting: Internal Medicine

## 2016-10-09 NOTE — Telephone Encounter (Signed)
Dr Derrel Nip has spoken with Santiago Glad.  Duration of keflex clarified,  4 days was prescribed,  Other questions TBC once I receive the faxed information .

## 2016-10-09 NOTE — Telephone Encounter (Signed)
Transition Care Management Follow-up Telephone Call  How have you been since you were released from the hospital? Patient is doing better not as confused    Do you understand why you were in the hospital? Yes   Do you understand the discharge instrcutions? No , confused on one medication and ABX, Facility called [facility].  Items Reviewed:  Medications reviewed:Yes  Allergies reviewed: Yes  Dietary changes reviewed:Yes  Referrals reviewed: Yes   Functional Questionnaire:   Activities of Daily Living (ADLs):   She states they are independent in the following:Patient Independent dressing, bathing. States they require assistance with the following: With being reminded to go to restroom frequently,    Any transportation issues/concerns?: NO   Any patient concerns? UTI'S are becoming more  Frequent.   Confirmed importance and date/time of follow-up visits scheduled:Yes.   Confirmed with patient if condition begins to worsen call PCP or go to the ER.  Patient was given the Call-a-Nurse line [phone]: Yes.

## 2016-10-09 NOTE — Telephone Encounter (Signed)
Santiago Glad from [facility] called an wanted to go over some information that they received from the hospital with Dr. Derrel Nip. Please advise, thank you!  Call Santiago Glad @ [phone]

## 2016-10-09 NOTE — Telephone Encounter (Signed)
Please advise 

## 2016-10-11 ENCOUNTER — Ambulatory Visit (INDEPENDENT_AMBULATORY_CARE_PROVIDER_SITE_OTHER): Payer: PPO | Admitting: Internal Medicine

## 2016-10-11 ENCOUNTER — Encounter: Payer: Self-pay | Admitting: Internal Medicine

## 2016-10-11 VITALS — BP 146/70 | HR 71 | Resp 16 | Ht 66.0 in | Wt 126.8 lb

## 2016-10-11 DIAGNOSIS — Z9189 Other specified personal risk factors, not elsewhere classified: Secondary | ICD-10-CM | POA: Diagnosis not present

## 2016-10-11 DIAGNOSIS — N3020 Other chronic cystitis without hematuria: Secondary | ICD-10-CM

## 2016-10-11 DIAGNOSIS — N390 Urinary tract infection, site not specified: Secondary | ICD-10-CM

## 2016-10-11 DIAGNOSIS — I10 Essential (primary) hypertension: Secondary | ICD-10-CM | POA: Diagnosis not present

## 2016-10-11 DIAGNOSIS — F5104 Psychophysiologic insomnia: Secondary | ICD-10-CM | POA: Diagnosis not present

## 2016-10-11 DIAGNOSIS — Z09 Encounter for follow-up examination after completed treatment for conditions other than malignant neoplasm: Secondary | ICD-10-CM

## 2016-10-11 DIAGNOSIS — I480 Paroxysmal atrial fibrillation: Secondary | ICD-10-CM | POA: Diagnosis not present

## 2016-10-11 DIAGNOSIS — IMO0001 Reserved for inherently not codable concepts without codable children: Secondary | ICD-10-CM

## 2016-10-11 DIAGNOSIS — L22 Diaper dermatitis: Secondary | ICD-10-CM

## 2016-10-11 LAB — POCT URINALYSIS DIPSTICK
Bilirubin, UA: NEGATIVE
GLUCOSE UA: NEGATIVE
Ketones, UA: NEGATIVE
Leukocytes, UA: NEGATIVE
NITRITE UA: NEGATIVE
Protein, UA: NEGATIVE
RBC UA: NEGATIVE
SPEC GRAV UA: 1.01
UROBILINOGEN UA: 0.2
pH, UA: 5.5

## 2016-10-11 MED ORDER — ESTROGENS, CONJUGATED 0.625 MG/GM VA CREA
1.0000 | TOPICAL_CREAM | VAGINAL | 3 refills | Status: DC
Start: 2016-10-11 — End: 2017-01-04

## 2016-10-11 NOTE — Progress Notes (Signed)
Pre visit review using our clinic review tool, if applicable. No additional management support is needed unless otherwise documented below in the visit note. 

## 2016-10-11 NOTE — Patient Instructions (Addendum)
Make sure you are taking a probiotic EVERY DAY   You have a rash  On your female parts because your urine irritates your skin when you you wet your pants   You should still be using vaginal estrogen (Premarin,  Or Estrace) .Marland Kitchen I will refill this today

## 2016-10-11 NOTE — Progress Notes (Signed)
Subjective:  Patient ID: Evelyn [name], female    DOB: 08-14-1919  Age: 83 y.o. MRN: [id number]  CC: The primary encounter diagnosis was Recurrent UTI. Diagnoses of Chronic insomnia, Diaper dermatitis, Essential hypertension, Paroxysmal atrial fibrillation ([place]), Cystitis, chronic, Transition of care performed with sharing of clinical summary, and Hospital discharge follow-up were also pertinent to this visit.  HPI EMNET MONK presents for hospital follow up .  She is accompanied by her son.  She was admitted to [facility] on March 6 with metabolic encephalopathy  secondary to UTI  Which was treated for 3 days with IV rocephin.  Urine culture grew Klebsiella and she was discharged to a/l facility  on March 9 with 4 more days of Keflex.    She was noted to be quite hypertensive and amlodipine was added  Medications for insomnia were held at admission and generic Lorrin Mais was re prescribed at discharge.   She doesn't feel well.  She is not sure her infection has resolved because  Her rectum and perineal area itches.  She has a history of urinary incontinence and , postmenopausal atrophic vaginitis managed with vaginal estrogen.  Son wonders if she is receiving it,  Denies dysuria    Outpatient Medications Prior to Visit  Medication Sig Dispense Refill  . acetaminophen (TYLENOL) 325 MG tablet Take 650 mg by mouth every 8 (eight) hours as needed.    Marland Kitchen amLODipine (NORVASC) 10 MG tablet Take 1 tablet (10 mg total) by mouth daily. 30 tablet 0  . apixaban (ELIQUIS) 2.5 MG TABS tablet Take 2.5 mg by mouth 2 (two) times daily.    . cephALEXin (KEFLEX) 250 MG capsule Take 1 capsule (250 mg total) by mouth 2 (two) times daily. 8 capsule 0  . Cranberry 250 MG TABS Take 1 tablet (250 mg total) by mouth 2 (two) times daily. 60 tablet 12  . dextromethorphan (DELSYM) 30 MG/5ML liquid Take 30 mg by mouth every 12 (twelve) hours as needed for cough.    . dibucaine (NUPERCAINAL) 1 % ointment Apply topically  3 (three) times daily as needed for pain. From henmorrhoids 30 g 4  . diphenhydrAMINE (BENADRYL) 50 MG capsule Take 50 mg by mouth at bedtime as needed.    . fesoterodine (TOVIAZ) 4 MG TB24 tablet Take 1 tablet (4 mg total) by mouth daily. 90 tablet 4  . hydrocortisone (ANUSOL-HC) 2.5 % rectal cream Place 1 application rectally 2 (two) times daily. 30 g 0  . hydrocortisone (ANUSOL-HC) 25 MG suppository Place 1 suppository (25 mg total) rectally 2 (two) times daily. 12 suppository 0  . levothyroxine (SYNTHROID, LEVOTHROID) 100 MCG tablet Take 100 mcg by mouth daily.    Marland Kitchen loperamide (IMODIUM A-D) 2 MG tablet Take 4 mg by mouth 4 (four) times daily as needed for diarrhea or loose stools.    . mirtazapine (REMERON) 7.5 MG tablet Take 1 tablet (7.5 mg total) by mouth at bedtime. 90 tablet 1  . Multiple Vitamins-Minerals (MULTIVITAMIN WITH MINERALS) tablet Take 1 tablet by mouth daily.    Marland Kitchen omeprazole (PRILOSEC) 20 MG capsule Take 20 mg by mouth daily.    . Probiotic Product (PROBIOTIC PO) Take by mouth.    . propranolol (INDERAL) 20 MG tablet Take 1.5 tablets (30 mg total) by mouth 2 (two) times daily. 90 tablet 3  . tolterodine (DETROL) 2 MG tablet Take 4 mg by mouth daily.    Marland Kitchen triamcinolone cream (KENALOG) 0.1 % Apply 1 application topically 2 (two)  times daily. To irritated area 45 g 0  . vitamin B-12 (CYANOCOBALAMIN) 1000 MCG tablet Take 1,000 mcg by mouth daily.    Marland Kitchen zolpidem (AMBIEN) 5 MG tablet Take 5 mg by mouth at bedtime as needed for sleep.    Marland Kitchen conjugated estrogens (PREMARIN) vaginal cream Place 1 Applicatorful vaginally every Monday, Wednesday, and Friday. 42.5 g 3   No facility-administered medications prior to visit.     Review of Systems;  Patient denies headache, fevers, malaise, unintentional weight loss, skin rash, eye pain, sinus congestion and sinus pain, sore throat, dysphagia,  hemoptysis , cough, dyspnea, wheezing, chest pain, palpitations, orthopnea, edema, abdominal pain,  nausea, melena, diarrhea, constipation, flank pain, dysuria, hematuria, urinary  Frequency, nocturia, numbness, tingling, seizures,  Focal weakness, Loss of consciousness,  Tremor, insomnia, depression, anxiety, and suicidal ideation.      Objective:  BP (!) 146/70 (BP Location: Right Arm, Patient Position: Sitting, Cuff Size: Normal)   Pulse 71   Resp 16   Ht 5\' 6"  (1.676 m)   Wt 126 lb 12.8 oz (57.5 kg)   SpO2 98%   BMI 20.47 kg/m   BP Readings from Last 3 Encounters:  10/11/16 (!) 146/70  10/06/16 (!) 133/57  09/13/16 120/69    Wt Readings from Last 3 Encounters:  10/11/16 126 lb 12.8 oz (57.5 kg)  10/04/16 127 lb 14.4 oz (58 kg)  09/13/16 124 lb 4.8 oz (56.4 kg)    General appearance: alert, cooperative and appears stated age Ears: normal TM's and external ear canals both ears Throat: lips, mucosa, and tongue normal; teeth and gums normal Neck: no adenopathy, no carotid bruit, supple, symmetrical, trachea midline and thyroid not enlarged, symmetric, no tenderness/mass/nodules Back: symmetric, no curvature. ROM normal. No CVA tenderness. Lungs: clear to auscultation bilaterally Heart: regular rate and rhythm, S1, S2 normal, no murmur, click, rub or gallop Abdomen: soft, non-tender; bowel sounds normal; no masses,  no organomegaly Pulses: 2+ and symmetric Skin: Skin color, texture, turgor normal. No rashes or lesions Lymph nodes: Cervical, supraclavicular, and axillary nodes normal.  Lab Results  Component Value Date   HGBA1C 6.1 03/23/2014    Lab Results  Component Value Date   CREATININE 1.03 (H) 10/05/2016   CREATININE 0.99 10/04/2016   CREATININE 0.87 10/03/2016    Lab Results  Component Value Date   WBC 8.1 10/05/2016   HGB 10.5 (L) 10/05/2016   HCT 32.3 (L) 10/05/2016   PLT 340 10/05/2016   GLUCOSE 104 (H) 10/05/2016   CHOL 260 (H) 03/23/2014   TRIG 268.0 (H) 03/23/2014   HDL 43.60 03/23/2014   LDLDIRECT 210.1 03/23/2014   LDLCALC 191 (H)  12/26/2013   ALT 11 (L) 10/03/2016   AST 21 10/03/2016   NA 136 10/05/2016   K 3.8 10/05/2016   CL 105 10/05/2016   CREATININE 1.03 (H) 10/05/2016   BUN 15 10/05/2016   CO2 27 10/05/2016   TSH 0.59 01/25/2016   INR 1.00 05/05/2015   HGBA1C 6.1 03/23/2014    Ct Head Wo Contrast  Result Date: 10/03/2016 CLINICAL DATA:  Altered mental status for 3 days, concurrent urinary tract infection and incontinence. History of lymphoma. EXAM: CT HEAD WITHOUT CONTRAST TECHNIQUE: Contiguous axial images were obtained from the base of the skull through the vertex without intravenous contrast. COMPARISON:  CT HEAD May 05, 2015 FINDINGS: BRAIN: The ventricles and sulci are normal for age. No intraparenchymal hemorrhage, mass effect nor midline shift. Confluent supratentorial white matter hypodensities. Old small RIGHT  cerebellar infarcts. Old bilateral thalamus lacunar infarcts. Old RIGHT basal ganglia lacunar infarct. Acute large vascular territory infarcts. No abnormal extra-axial fluid collections. Basal cisterns are patent. VASCULAR: Moderate calcific atherosclerosis of the carotid siphons. SKULL: No skull fracture. Osteopenia. No significant scalp soft tissue swelling. SINUSES/ORBITS: Severe chronic LEFT maxillary sinusitis with central dense inspissated mucus versus fungal component, mild chronic RIGHT maxillary sinusitis. The included ocular globes and orbital contents are non-suspicious. OTHER: None. IMPRESSION: 1. No acute intracranial process 2. Severe chronic small vessel ischemic disease. 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 Electronically Signed   By: Elon Alas M.D.   On: 10/03/2016 20:00    Assessment & Plan:   Problem List Items Addressed This Visit    Chronic insomnia    Managed with Lorrin Mais for years despite age due to failure of other medications to be effective. Her son and she are aware of the risks of continued use.       Cystitis, chronic    She has recurrent  UTIs  Which present with delirium.  Her son states that she is still confused and is requesting a repeat assessment to rule out persistent infection.  UA is normmal today       Diaper dermatitis    Reminded to resume use of dermacloud cream ordered ,  Apply twice daily      Hospital discharge follow-up    Patient is stable post discharge and has no new issues.  all questions about discharge plans  Were addressed at the visit today for hospital follow up. All labs , imaging studies and progress notes from admission were reviewed with patient today        Hypertension    Improved with addition of amlodipine  To inderal 30 mg bid      Paroxysmal atrial fibrillation (H[facility])    Rate controlled on inderal anticoagulated with eliquis Lab Results  Component Value Date   TSH 0.59 01/25/2016   Lab Results  Component Value Date   NA 136 10/05/2016   K 3.8 10/05/2016   CL 105 10/05/2016   CO2 27 10/05/2016    Lab Results  Component Value Date   WBC 8.1 10/05/2016   HGB 10.5 (L) 10/05/2016   HCT 32.3 (L) 10/05/2016   MCV 84.8 10/05/2016   PLT 340 10/05/2016         RESOLVED: Transition of care performed with sharing of clinical summary    Other Visit Diagnoses    Recurrent UTI    -  Primary   Relevant Orders   Urine Microscopic (Completed)   Urine culture   POCT urinalysis dipstick (Completed)      I am having Ms. Gully maintain her omeprazole, apixaban, vitamin B-12, levothyroxine, mirtazapine, multivitamin with minerals, zolpidem, loperamide, propranolol, Cranberry, fesoterodine, tolterodine, dibucaine, Probiotic Product (PROBIOTIC PO), triamcinolone cream, hydrocortisone, hydrocortisone, diphenhydrAMINE, dextromethorphan, acetaminophen, amLODipine, cephALEXin, and conjugated estrogens.  Meds ordered this encounter  Medications  . conjugated estrogens (PREMARIN) vaginal cream    Sig: Place 1 Applicatorful vaginally every Monday, Wednesday, and Friday.    Dispense:   42.5 g    Refill:  3    Medications Discontinued During This Encounter  Medication Reason  . conjugated estrogens (PREMARIN) vaginal cream Reorder    Follow-up: No Follow-up on file.   Crecencio Mc, MD

## 2016-10-12 DIAGNOSIS — Z9189 Other specified personal risk factors, not elsewhere classified: Secondary | ICD-10-CM

## 2016-10-12 DIAGNOSIS — IMO0001 Reserved for inherently not codable concepts without codable children: Secondary | ICD-10-CM | POA: Insufficient documentation

## 2016-10-12 LAB — URINALYSIS, MICROSCOPIC ONLY: RBC / HPF: NONE SEEN (ref 0–?)

## 2016-10-12 NOTE — Assessment & Plan Note (Signed)
Managed with Evelyn [name] for years despite age due to failure of other medications to be effective. Her son and she are aware of the risks of continued use.

## 2016-10-12 NOTE — Assessment & Plan Note (Signed)
Improved with addition of amlodipine  To inderal 30 mg bid

## 2016-10-12 NOTE — Assessment & Plan Note (Signed)
Rate controlled on inderal anticoagulated with eliquis Lab Results  Component Value Date   TSH 0.59 01/25/2016   Lab Results  Component Value Date   NA 136 10/05/2016   K 3.8 10/05/2016   CL 105 10/05/2016   CO2 27 10/05/2016    Lab Results  Component Value Date   WBC 8.1 10/05/2016   HGB 10.5 (L) 10/05/2016   HCT 32.3 (L) 10/05/2016   MCV 84.8 10/05/2016   PLT 340 10/05/2016

## 2016-10-12 NOTE — Assessment & Plan Note (Signed)
She has recurrent UTIs  Which present with delirium.  Her son states that she is still confused and is requesting a repeat assessment to rule out persistent infection.  UA is normmal today

## 2016-10-12 NOTE — Assessment & Plan Note (Signed)
Reminded to resume use of dermacloud cream ordered ,  Apply twice daily

## 2016-10-12 NOTE — Assessment & Plan Note (Signed)
Patient is stable post discharge and has no new issues.  all questions about discharge plans  Were addressed at the visit today for hospital follow up. All labs , imaging studies and progress notes from admission were reviewed with patient today

## 2016-10-13 LAB — URINE CULTURE

## 2016-10-31 ENCOUNTER — Telehealth: Payer: Self-pay | Admitting: Internal Medicine

## 2016-10-31 NOTE — Telephone Encounter (Signed)
Reason for call: flu like symptoms  Symptoms:cough since  last week, tiredness  Monday, no appetite, low grade fever 99.4 8:00am gave Tylenol re-checked at 1000 and 98.0,  Duration Monday Medications:Tylenol Whole facility under quarantined  for flu , they are aware you 're not in until this afternoon please advise

## 2016-10-31 NOTE — Telephone Encounter (Signed)
Santiago Glad at [facility] advised of below

## 2016-10-31 NOTE — Telephone Encounter (Signed)
Continue suppurtive care,  She is outside of the window of treatment with tamiflu

## 2016-10-31 NOTE — Telephone Encounter (Signed)
Evelyn [name] from [facility] called and stated that pt is still not feeling good and states that the pt is running low grade fever at 99.4, tired, does not want to eat, and showing signs of the flu. The facility is quarantined for the flu. Please advise, thank you!  Call Evelyn [name] @ [phone]

## 2016-11-09 ENCOUNTER — Emergency Department: Payer: PPO

## 2016-11-09 ENCOUNTER — Encounter: Payer: Self-pay | Admitting: Emergency Medicine

## 2016-11-09 ENCOUNTER — Inpatient Hospital Stay
Admission: EM | Admit: 2016-11-09 | Discharge: 2016-11-15 | DRG: 689 | Disposition: A | Discharge status: Skilled Nursing Facility | Payer: PPO | Attending: Internal Medicine | Admitting: Internal Medicine

## 2016-11-09 DIAGNOSIS — J449 Chronic obstructive pulmonary disease, unspecified: Secondary | ICD-10-CM | POA: Diagnosis present

## 2016-11-09 DIAGNOSIS — I4891 Unspecified atrial fibrillation: Secondary | ICD-10-CM | POA: Diagnosis not present

## 2016-11-09 DIAGNOSIS — E876 Hypokalemia: Secondary | ICD-10-CM

## 2016-11-09 DIAGNOSIS — K219 Gastro-esophageal reflux disease without esophagitis: Secondary | ICD-10-CM | POA: Diagnosis present

## 2016-11-09 DIAGNOSIS — R410 Disorientation, unspecified: Secondary | ICD-10-CM | POA: Diagnosis not present

## 2016-11-09 DIAGNOSIS — I482 Chronic atrial fibrillation: Secondary | ICD-10-CM | POA: Diagnosis not present

## 2016-11-09 DIAGNOSIS — Z888 Allergy status to other drugs, medicaments and biological substances status: Secondary | ICD-10-CM | POA: Diagnosis not present

## 2016-11-09 DIAGNOSIS — E039 Hypothyroidism, unspecified: Secondary | ICD-10-CM | POA: Diagnosis present

## 2016-11-09 DIAGNOSIS — G9341 Metabolic encephalopathy: Secondary | ICD-10-CM | POA: Diagnosis not present

## 2016-11-09 DIAGNOSIS — Z881 Allergy status to other antibiotic agents status: Secondary | ICD-10-CM | POA: Diagnosis not present

## 2016-11-09 DIAGNOSIS — Z515 Encounter for palliative care: Secondary | ICD-10-CM | POA: Diagnosis not present

## 2016-11-09 DIAGNOSIS — Z6820 Body mass index (BMI) 20.0-20.9, adult: Secondary | ICD-10-CM

## 2016-11-09 DIAGNOSIS — I5032 Chronic diastolic (congestive) heart failure: Secondary | ICD-10-CM | POA: Diagnosis not present

## 2016-11-09 DIAGNOSIS — Z833 Family history of diabetes mellitus: Secondary | ICD-10-CM

## 2016-11-09 DIAGNOSIS — Z8249 Family history of ischemic heart disease and other diseases of the circulatory system: Secondary | ICD-10-CM | POA: Diagnosis not present

## 2016-11-09 DIAGNOSIS — R531 Weakness: Secondary | ICD-10-CM

## 2016-11-09 DIAGNOSIS — Z882 Allergy status to sulfonamides status: Secondary | ICD-10-CM

## 2016-11-09 DIAGNOSIS — I480 Paroxysmal atrial fibrillation: Secondary | ICD-10-CM | POA: Diagnosis present

## 2016-11-09 DIAGNOSIS — E46 Unspecified protein-calorie malnutrition: Secondary | ICD-10-CM | POA: Diagnosis not present

## 2016-11-09 DIAGNOSIS — N390 Urinary tract infection, site not specified: Secondary | ICD-10-CM | POA: Diagnosis not present

## 2016-11-09 DIAGNOSIS — I110 Hypertensive heart disease with heart failure: Secondary | ICD-10-CM | POA: Diagnosis present

## 2016-11-09 DIAGNOSIS — H9190 Unspecified hearing loss, unspecified ear: Secondary | ICD-10-CM | POA: Diagnosis present

## 2016-11-09 DIAGNOSIS — E785 Hyperlipidemia, unspecified: Secondary | ICD-10-CM | POA: Diagnosis present

## 2016-11-09 DIAGNOSIS — Z8572 Personal history of non-Hodgkin lymphomas: Secondary | ICD-10-CM

## 2016-11-09 DIAGNOSIS — Z66 Do not resuscitate: Secondary | ICD-10-CM | POA: Diagnosis not present

## 2016-11-09 DIAGNOSIS — R4182 Altered mental status, unspecified: Secondary | ICD-10-CM | POA: Diagnosis not present

## 2016-11-09 DIAGNOSIS — I2510 Atherosclerotic heart disease of native coronary artery without angina pectoris: Secondary | ICD-10-CM | POA: Diagnosis not present

## 2016-11-09 DIAGNOSIS — G9340 Encephalopathy, unspecified: Secondary | ICD-10-CM | POA: Diagnosis not present

## 2016-11-09 DIAGNOSIS — D649 Anemia, unspecified: Secondary | ICD-10-CM | POA: Diagnosis present

## 2016-11-09 DIAGNOSIS — Z885 Allergy status to narcotic agent status: Secondary | ICD-10-CM | POA: Diagnosis not present

## 2016-11-09 DIAGNOSIS — Z1612 Extended spectrum beta lactamase (ESBL) resistance: Secondary | ICD-10-CM

## 2016-11-09 DIAGNOSIS — Z96641 Presence of right artificial hip joint: Secondary | ICD-10-CM | POA: Diagnosis not present

## 2016-11-09 DIAGNOSIS — A499 Bacterial infection, unspecified: Secondary | ICD-10-CM | POA: Diagnosis present

## 2016-11-09 DIAGNOSIS — Z7189 Other specified counseling: Secondary | ICD-10-CM

## 2016-11-09 DIAGNOSIS — F329 Major depressive disorder, single episode, unspecified: Secondary | ICD-10-CM | POA: Diagnosis present

## 2016-11-09 DIAGNOSIS — Z87440 Personal history of urinary (tract) infections: Secondary | ICD-10-CM | POA: Diagnosis not present

## 2016-11-09 DIAGNOSIS — I10 Essential (primary) hypertension: Secondary | ICD-10-CM | POA: Diagnosis not present

## 2016-11-09 DIAGNOSIS — Z87891 Personal history of nicotine dependence: Secondary | ICD-10-CM

## 2016-11-09 DIAGNOSIS — F0390 Unspecified dementia without behavioral disturbance: Secondary | ICD-10-CM | POA: Diagnosis present

## 2016-11-09 DIAGNOSIS — Z955 Presence of coronary angioplasty implant and graft: Secondary | ICD-10-CM

## 2016-11-09 DIAGNOSIS — Z90710 Acquired absence of both cervix and uterus: Secondary | ICD-10-CM | POA: Diagnosis not present

## 2016-11-09 DIAGNOSIS — Z7901 Long term (current) use of anticoagulants: Secondary | ICD-10-CM | POA: Diagnosis not present

## 2016-11-09 DIAGNOSIS — R6889 Other general symptoms and signs: Secondary | ICD-10-CM | POA: Diagnosis not present

## 2016-11-09 DIAGNOSIS — B9620 Unspecified Escherichia coli [E. coli] as the cause of diseases classified elsewhere: Secondary | ICD-10-CM | POA: Diagnosis present

## 2016-11-09 DIAGNOSIS — N3000 Acute cystitis without hematuria: Secondary | ICD-10-CM | POA: Diagnosis not present

## 2016-11-09 HISTORY — DX: Hypothyroidism, unspecified: E03.9

## 2016-11-09 HISTORY — DX: Myoneural disorder, unspecified: G70.9

## 2016-11-09 LAB — URINALYSIS, COMPLETE (UACMP) WITH MICROSCOPIC
Bilirubin Urine: NEGATIVE
GLUCOSE, UA: NEGATIVE mg/dL
Ketones, ur: NEGATIVE mg/dL
NITRITE: POSITIVE — AB
PH: 5 (ref 5.0–8.0)
PROTEIN: NEGATIVE mg/dL
SPECIFIC GRAVITY, URINE: 1.008 (ref 1.005–1.030)

## 2016-11-09 LAB — COMPREHENSIVE METABOLIC PANEL
ALBUMIN: 3.2 g/dL — AB (ref 3.5–5.0)
ALK PHOS: 78 U/L (ref 38–126)
ALT: 12 U/L — ABNORMAL LOW (ref 14–54)
ANION GAP: 7 (ref 5–15)
AST: 18 U/L (ref 15–41)
BILIRUBIN TOTAL: 0.2 mg/dL — AB (ref 0.3–1.2)
BUN: 12 mg/dL (ref 6–20)
CO2: 28 mmol/L (ref 22–32)
Calcium: 8.9 mg/dL (ref 8.9–10.3)
Chloride: 104 mmol/L (ref 101–111)
Creatinine, Ser: 0.89 mg/dL (ref 0.44–1.00)
GFR calc non Af Amer: 54 mL/min — ABNORMAL LOW (ref >60)
GLUCOSE: 115 mg/dL — AB (ref 65–99)
POTASSIUM: 3.4 mmol/L — AB (ref 3.5–5.1)
Sodium: 139 mmol/L (ref 135–145)
TOTAL PROTEIN: 7.1 g/dL (ref 6.5–8.1)

## 2016-11-09 LAB — TSH: TSH: 0.799 u[IU]/mL (ref 0.350–4.500)

## 2016-11-09 LAB — CBC
HCT: 33.5 % — ABNORMAL LOW (ref 35.0–47.0)
HEMOGLOBIN: 10.9 g/dL — AB (ref 12.0–16.0)
MCH: 26.7 pg (ref 26.0–34.0)
MCHC: 32.4 g/dL (ref 32.0–36.0)
MCV: 82.5 fL (ref 80.0–100.0)
Platelets: 524 10*3/uL — ABNORMAL HIGH (ref 150–440)
RBC: 4.07 MIL/uL (ref 3.80–5.20)
RDW: 16.3 % — AB (ref 11.5–14.5)
WBC: 9.8 10*3/uL (ref 3.6–11.0)

## 2016-11-09 LAB — MRSA PCR SCREENING: MRSA by PCR: NEGATIVE

## 2016-11-09 LAB — AMMONIA: AMMONIA: 12 umol/L (ref 9–35)

## 2016-11-09 LAB — TROPONIN I: Troponin I: <0.03 ng/mL (ref <0.03)

## 2016-11-09 MED ORDER — PROPRANOLOL HCL 20 MG PO TABS
30.0000 mg | ORAL_TABLET | Freq: Two times a day (BID) | ORAL | Status: DC
  Administered 2016-11-09 – 2016-11-15 (×12): 30 mg via ORAL
  Filled 2016-11-09 (×13): qty 2

## 2016-11-09 MED ORDER — DIPHENHYDRAMINE HCL 25 MG PO CAPS
50.0000 mg | ORAL_CAPSULE | Freq: Every day | ORAL | Status: DC
  Administered 2016-11-09: 50 mg via ORAL
  Filled 2016-11-09: qty 2

## 2016-11-09 MED ORDER — ONDANSETRON HCL 4 MG PO TABS: 4.0000 mg | ORAL_TABLET | Freq: Four times a day (QID) | ORAL | Status: DC | PRN

## 2016-11-09 MED ORDER — AMLODIPINE BESYLATE 10 MG PO TABS
10.0000 mg | ORAL_TABLET | Freq: Every day | ORAL | Status: DC
  Administered 2016-11-11 – 2016-11-15 (×4): 10 mg via ORAL
  Filled 2016-11-09 (×6): qty 1

## 2016-11-09 MED ORDER — ALBUTEROL SULFATE (2.5 MG/3ML) 0.083% IN NEBU: 2.5000 mg | INHALATION_SOLUTION | RESPIRATORY_TRACT | Status: DC | PRN

## 2016-11-09 MED ORDER — LOPERAMIDE HCL 2 MG PO CAPS: 4.0000 mg | ORAL_CAPSULE | ORAL | Status: DC | PRN

## 2016-11-09 MED ORDER — LEVOTHYROXINE SODIUM 100 MCG PO TABS
100.0000 ug | ORAL_TABLET | Freq: Every day | ORAL | Status: DC
  Administered 2016-11-10 – 2016-11-15 (×6): 100 ug via ORAL
  Filled 2016-11-09 (×6): qty 1

## 2016-11-09 MED ORDER — SENNOSIDES-DOCUSATE SODIUM 8.6-50 MG PO TABS
1.0000 | ORAL_TABLET | Freq: Every evening | ORAL | Status: DC | PRN
  Administered 2016-11-14: 1 via ORAL
  Filled 2016-11-09: qty 1

## 2016-11-09 MED ORDER — ACETAMINOPHEN 650 MG RE SUPP: 650.0000 mg | Freq: Four times a day (QID) | RECTAL | Status: DC | PRN

## 2016-11-09 MED ORDER — BISACODYL 5 MG PO TBEC: 5.0000 mg | DELAYED_RELEASE_TABLET | Freq: Every day | ORAL | Status: DC | PRN

## 2016-11-09 MED ORDER — ZOLPIDEM TARTRATE 5 MG PO TABS: 5.0000 mg | ORAL_TABLET | Freq: Every evening | ORAL | Status: DC | PRN

## 2016-11-09 MED ORDER — DEXTROSE 5 % IV SOLN: 1.0000 g | Freq: Once | INTRAVENOUS | Status: DC

## 2016-11-09 MED ORDER — ONDANSETRON HCL 4 MG/2ML IJ SOLN: 4.0000 mg | Freq: Four times a day (QID) | INTRAMUSCULAR | Status: DC | PRN

## 2016-11-09 MED ORDER — SODIUM CHLORIDE 0.9 % IV BOLUS (SEPSIS)
1000.0000 mL | Freq: Once | INTRAVENOUS | Status: AC
  Administered 2016-11-09: 1000 mL via INTRAVENOUS

## 2016-11-09 MED ORDER — PANTOPRAZOLE SODIUM 40 MG PO TBEC
40.0000 mg | DELAYED_RELEASE_TABLET | Freq: Every day | ORAL | Status: DC
  Administered 2016-11-10 – 2016-11-15 (×6): 40 mg via ORAL
  Filled 2016-11-09 (×6): qty 1

## 2016-11-09 MED ORDER — DEXTROMETHORPHAN POLISTIREX ER 30 MG/5ML PO SUER
30.0000 mg | Freq: Two times a day (BID) | ORAL | Status: DC | PRN
  Filled 2016-11-09: qty 5

## 2016-11-09 MED ORDER — SODIUM CHLORIDE 0.9 % IV SOLN
INTRAVENOUS | Status: DC
  Administered 2016-11-09: 14:00:00 via INTRAVENOUS

## 2016-11-09 MED ORDER — RISAQUAD PO CAPS
1.0000 | ORAL_CAPSULE | Freq: Every day | ORAL | Status: DC
  Administered 2016-11-10 – 2016-11-15 (×6): 1 via ORAL
  Filled 2016-11-09 (×7): qty 1

## 2016-11-09 MED ORDER — DEXTROSE 5 % IV SOLN
1.0000 g | INTRAVENOUS | Status: DC
  Administered 2016-11-10 – 2016-11-12 (×3): 1 g via INTRAVENOUS
  Filled 2016-11-09 (×3): qty 10

## 2016-11-09 MED ORDER — OXYBUTYNIN CHLORIDE ER 5 MG PO TB24
5.0000 mg | ORAL_TABLET | Freq: Every day | ORAL | Status: DC
  Administered 2016-11-09 – 2016-11-14 (×6): 5 mg via ORAL
  Filled 2016-11-09 (×6): qty 1

## 2016-11-09 MED ORDER — POTASSIUM CHLORIDE CRYS ER 20 MEQ PO TBCR
40.0000 meq | EXTENDED_RELEASE_TABLET | Freq: Once | ORAL | Status: AC
  Administered 2016-11-09: 40 meq via ORAL
  Filled 2016-11-09: qty 2

## 2016-11-09 MED ORDER — ACETAMINOPHEN 325 MG PO TABS: 650.0000 mg | ORAL_TABLET | Freq: Four times a day (QID) | ORAL | Status: DC | PRN

## 2016-11-09 MED ORDER — APIXABAN 2.5 MG PO TABS
2.5000 mg | ORAL_TABLET | Freq: Two times a day (BID) | ORAL | Status: DC
  Administered 2016-11-09 – 2016-11-15 (×13): 2.5 mg via ORAL
  Filled 2016-11-09 (×12): qty 1

## 2016-11-09 MED ORDER — MIRTAZAPINE 15 MG PO TABS
7.5000 mg | ORAL_TABLET | Freq: Every day | ORAL | Status: DC
Start: 2016-11-09 — End: 2016-11-14
  Administered 2016-11-09 – 2016-11-14 (×5): 7.5 mg via ORAL
  Filled 2016-11-09 (×5): qty 1

## 2016-11-09 MED ORDER — ESTROGENS, CONJUGATED 0.625 MG/GM VA CREA
1.0000 | TOPICAL_CREAM | Freq: Every day | VAGINAL | Status: DC
  Administered 2016-11-09 – 2016-11-15 (×5): 1 via VAGINAL
  Filled 2016-11-09: qty 30

## 2016-11-09 MED ORDER — CEFTRIAXONE SODIUM-DEXTROSE 1-3.74 GM-% IV SOLR
1.0000 g | Freq: Once | INTRAVENOUS | Status: AC
  Administered 2016-11-09: 1 g via INTRAVENOUS
  Filled 2016-11-09: qty 50

## 2016-11-09 MED ORDER — VITAMIN B-12 1000 MCG PO TABS
1000.0000 ug | ORAL_TABLET | Freq: Every day | ORAL | Status: DC
  Administered 2016-11-10 – 2016-11-15 (×6): 1000 ug via ORAL
  Filled 2016-11-09 (×6): qty 1

## 2016-11-09 NOTE — H&P (Addendum)
[facility] [place] NAME: Evelyn [name]    MR#:  [id number]  DATE OF BIRTH:  July 02, 1920  DATE OF ADMISSION:  11/09/2016  PRIMARY CARE PHYSICIAN: Crecencio Mc, MD   REQUESTING/REFERRING PHYSICIAN: Eula Listen, MD  CHIEF COMPLAINT:   Chief Complaint  Patient presents with  . Altered Mental Status   Altered mental status. HISTORY OF PRESENT ILLNESS:  Evelyn [name]  is a 83 y.o. female with a known history of Recurrent UTI, hypertension, hyperlipidemia, and anemia, A. Fib on Eliquis and CAD. The patient was sent from nursing facility to the ED due to altered mental status. His urinalysis show UTI. Previous urine culture showed Klebsiella pneumonia, sensitive to Rocephin. So ED physician start Rocephin IV.  PAST MEDICAL HISTORY:   Past Medical History:  Diagnosis Date  . Anemia    hx of blood transfusion  . Cancer ([place])    lymphomia  . Coarse tremors    head shakes if does not take propranolol  . Coronary artery disease   . GERD (gastroesophageal reflux disease)   . History of lymphoma   . Hyperlipidemia   . Hypertension   . Hypothyroidism   . Insomnia    takes remeron nightly  . Neuromuscular disorder ([place])   . Other chronic cystitis    Radiation , botox injections  . Self-catheterizes urinary bladder    twice daily  . Thyroid disease    had thyroid removed    PAST SURGICAL HISTORY:   Past Surgical History:  Procedure Laterality Date  . ABDOMINAL HYSTERECTOMY  1970s  . APPENDECTOMY    . BACK SURGERY     lower back - bulging disk  . CARDIAC CATHETERIZATION  2005   2 cardiac stents  . CYSTOSCOPY  April 2013  . EYE SURGERY     bilateral cataracts  . JOINT REPLACEMENT  2012   right hip  . LUMBAR LAMINECTOMY/DECOMPRESSION MICRODISCECTOMY  01/24/2012   Procedure: LUMBAR LAMINECTOMY/DECOMPRESSION MICRODISCECTOMY 2 LEVELS;  Surgeon: Floyce Stakes, MD;  Location: [facility];  Service: Neurosurgery;   Laterality: Right;  Right Lumbar four five, lumbar five sacral one foraminotomy   . [place] SURGERY  2008   [place]  . STOMACH SURGERY     removal all but small portion of stomach out due to ulcers    SOCIAL HISTORY:   Social History  Substance Use Topics  . Smoking status: Former Smoker    Quit date: 04/04/1971  . Smokeless tobacco: Never Used  . Alcohol use No    FAMILY HISTORY:   Family History  Problem Relation Age of Onset  . Diabetes Mother   . Heart disease Father   . Kidney disease Neg Hx   . Bladder Cancer Neg Hx     DRUG ALLERGIES:   Allergies  Allergen Reactions  . Macrolides And Ketolides Other (See Comments)  . Oxycodone Nausea Only  . Ciprofloxacin Swelling, Rash and Other (See Comments)  . Neomy-Bacit-Polymyx-Pramoxine Rash    "All antibiotics"  . Other Rash    "All antibiotics"  . Sulfa Antibiotics Swelling, Rash and Other (See Comments)    REVIEW OF SYSTEMS:   Review of Systems  Unable to perform ROS: Mental status change    MEDICATIONS AT HOME:   Prior to Admission medications   Medication Sig Start Date End Date Taking? Authorizing Provider  acetaminophen (TYLENOL) 325 MG tablet Take 650 mg by mouth every 8 (eight) hours as needed for  mild pain.    Yes Historical Provider, MD  acidophilus (RISAQUAD) CAPS capsule Take 1 capsule by mouth daily.   Yes Historical Provider, MD  amLODipine (NORVASC) 10 MG tablet Take 1 tablet (10 mg total) by mouth daily. 10/06/16  Yes Vipul Manuella Ghazi, MD  apixaban (ELIQUIS) 2.5 MG TABS tablet Take 2.5 mg by mouth every 12 (twelve) hours.    Yes Historical Provider, MD  conjugated estrogens (PREMARIN) vaginal cream Place 1 Applicatorful vaginally every Monday, Wednesday, and Friday. Patient taking differently: Place 1 Applicatorful vaginally every Monday, Wednesday, and Friday. At bedtime 10/11/16  Yes Crecencio Mc, MD  Cranberry 250 MG TABS Take 1 tablet (250 mg total) by mouth 2 (two) times daily. 02/21/16  Yes Shannon A  McGowan, PA-C  dextromethorphan (DELSYM) 30 MG/5ML liquid Take 30 mg by mouth every 12 (twelve) hours as needed for cough.   Yes Historical Provider, MD  dibucaine (NUPERCAINAL) 1 % ointment Apply topically 3 (three) times daily as needed for pain. From henmorrhoids Patient taking differently: Apply 1 application topically every 8 (eight) hours as needed for pain. From hemorrhoids 05/26/16  Yes Crecencio Mc, MD  diphenhydrAMINE (BENADRYL) 50 MG capsule Take 50 mg by mouth at bedtime.    Yes Historical Provider, MD  levothyroxine (SYNTHROID, LEVOTHROID) 100 MCG tablet Take 100 mcg by mouth daily.   Yes Historical Provider, MD  loperamide (IMODIUM A-D) 2 MG tablet Take 4 mg by mouth as needed for diarrhea or loose stools.    Yes Historical Provider, MD  mirtazapine (REMERON) 7.5 MG tablet Take 1 tablet (7.5 mg total) by mouth at bedtime. 06/22/15  Yes Crecencio Mc, MD  Multiple Vitamins-Minerals (MULTIVITAMIN WITH MINERALS) tablet Take 1 tablet by mouth daily.   Yes Historical Provider, MD  omeprazole (PRILOSEC) 20 MG capsule Take 20 mg by mouth daily.   Yes Historical Provider, MD  propranolol (INDERAL) 20 MG tablet Take 1.5 tablets (30 mg total) by mouth 2 (two) times daily. 01/25/16  Yes Crecencio Mc, MD  shark liver oil-cocoa butter (PREPARATION H) 0.25-3-85.5 % suppository Place 1 suppository rectally daily as needed for hemorrhoids.   Yes Historical Provider, MD  tolterodine (DETROL) 2 MG tablet Take 4 mg by mouth daily.   Yes Historical Provider, MD  triamcinolone cream (KENALOG) 0.1 % Apply 1 application topically 2 (two) times daily. To irritated area 06/20/16  Yes Crecencio Mc, MD  vitamin B-12 (CYANOCOBALAMIN) 1000 MCG tablet Take 1,000 mcg by mouth daily.   Yes Historical Provider, MD  zolpidem (AMBIEN) 5 MG tablet Take 5 mg by mouth at bedtime as needed for sleep.   Yes Historical Provider, MD  cephALEXin (KEFLEX) 250 MG capsule Take 1 capsule (250 mg total) by mouth 2 (two) times  daily. Patient not taking: Reported on 11/09/2016 10/06/16   Max Sane, MD      VITAL SIGNS:  Blood pressure (!) 155/67, pulse 88, temperature 97.8 F (36.6 C), temperature source Oral, resp. rate 17, height 5\' 6"  (1.676 m), weight 125 lb (56.7 kg), SpO2 96 %.  PHYSICAL EXAMINATION:  Physical Exam  GENERAL:  83 y.o.-year-old patient lying in the bed with no acute distress.  EYES: Pupils equal, round, reactive to light and accommodation. No scleral icterus. Extraocular muscles intact.  HEENT: Head atraumatic, normocephalic. Oropharynx and nasopharynx clear.  NECK:  Supple, no jugular venous distention. No thyroid enlargement, no tenderness.  LUNGS: Normal breath sounds bilaterally, no wheezing, rales,rhonchi or crepitation. No use of accessory muscles  of respiration.  CARDIOVASCULAR: S1, S2 normal. 3/6 systolic murmurs, no rubs, or gallops.  ABDOMEN: Soft, nontender, nondistended. Bowel sounds present. No organomegaly or mass.  EXTREMITIES: No pedal edema, cyanosis, or clubbing.  NEUROLOGIC: Cranial nerves II through XII are intact. Muscle strength 4/5 in all extremities. Sensation intact. Gait not checked.  PSYCHIATRIC: The patient is alert and oriented x 3.  SKIN: No obvious rash, lesion, or ulcer.   LABORATORY PANEL:   CBC  Recent Labs Lab 11/09/16 0921  WBC 9.8  HGB 10.9*  HCT 33.5*  PLT 524*   ------------------------------------------------------------------------------------------------------------------  Chemistries   Recent Labs Lab 11/09/16 0921  NA 139  K 3.4*  CL 104  CO2 28  GLUCOSE 115*  BUN 12  CREATININE 0.89  CALCIUM 8.9  AST 18  ALT 12*  ALKPHOS 78  BILITOT 0.2*   ------------------------------------------------------------------------------------------------------------------  Cardiac Enzymes  Recent Labs Lab 11/09/16 0921  TROPONINI <0.03    ------------------------------------------------------------------------------------------------------------------  RADIOLOGY:  Dg Chest 2 View  Result Date: 11/09/2016 CLINICAL DATA:  Paroxysms mole atrial fibrillation, aortic stenosis, coronary artery disease with stent placement, CHF. Acute mental status change. The patient is on anticoagulants. Remote history of smoking. EXAM: CHEST  2 VIEW COMPARISON:  PA and lateral chest x-ray of July 13, 2016 FINDINGS: The lungs are adequately inflated and clear. The heart and pulmonary vascularity are normal. There is calcification in the wall of the thoracic aorta. Coronary artery calcifications are visible as well. There is no pleural effusion. The bony thorax exhibits no acute abnormality. IMPRESSION: There is no acute cardiopulmonary abnormality. There are calcifications in the coronary arteries and in the thoracic aorta. Electronically Signed   By: David  Martinique M.D.   On: 11/09/2016 10:13   Ct Head Wo Contrast  Result Date: 11/09/2016 CLINICAL DATA:  Altered mental status EXAM: CT HEAD WITHOUT CONTRAST TECHNIQUE: Contiguous axial images were obtained from the base of the skull through the vertex without intravenous contrast. COMPARISON:  10/03/2016 FINDINGS: Brain: No intracranial hemorrhage, mass effect or midline shift. Again noted extensive periventricular and subcortical white matter decreased attenuation probable due to chronic small vessel ischemic changes. Stable lacunar infarcts in right basal ganglia and bilateral thalamus. Stable old infarct in right cerebellum. No definite acute cortical infarction. No mass lesion is noted on this unenhanced scan. Vascular: Atherosclerotic calcifications of carotid siphon. Skull: No skull fracture is noted. Sinuses/Orbits: There is extensive opacification with dense inspissated mucus left maxillary sinus probable due to chronic sinusitis. Fungal component cannot be excluded. There is partial opacification  of the right maxillary sinus. Partial opacification bilateral ethmoid air cells. There is hypoplastic right frontal sinus. Other: None IMPRESSION: No acute intracranial abnormality. No definite acute cortical infarction. Stable cerebral atrophy. Stable extensive periventricular and subcortical chronic white matter disease. Old lacunar infarcts right basal ganglia and bilateral thalamus are stable. Again noted old infarct in right cerebellum. Extensive paranasal sinuses disease as described above. Electronically Signed   By: Lahoma Crocker M.D.   On: 11/09/2016 09:52      IMPRESSION AND PLAN:   UTI. The patient will be placed or with vision. Continue Rocephin and follow-up urine culture. IV fluid support.  Acute metabolic encephalopathy due to UTI. Aspiration and fall precaution.  Hypokalemia. Give potassium supplements.  Chronic A. fib. Continue propranolol and Eliquis.  All the records are reviewed and case discussed with ED provider. Management plans discussed with the patient's son and they are in agreement.  CODE STATUS: Full code  TOTAL TIME  TAKING CARE OF THIS PATIENT: 48 minutes.    Demetrios Loll M.D on 11/09/2016 at 1:54 PM  Between 7am to 6pm - Pager - [phone]  After 6pm go to [email or web address] - Proofreader  [facility]  Office  [phone]  CC: Primary care physician; Crecencio Mc, MD   Note: This dictation was prepared with Dragon dictation along with smaller phrase technology. Any transcriptional errors that result from this process are unintentional.

## 2016-11-09 NOTE — ED Triage Notes (Addendum)
Pt was not acting right per assisted living this morning. Pt was found walking around without pants on. Fire dept on scene knows her and not acting her normal. Pt states "I just don't feel well". History UTI in past

## 2016-11-09 NOTE — ED Notes (Signed)
Bed alarm placed on patient for safety. Call light within reach.

## 2016-11-09 NOTE — ED Provider Notes (Signed)
[facility] Emergency Department Provider Note  ____________________________________________  Time seen: Approximately 9:14 AM  I have reviewed the triage vital signs and the nursing notes.   HISTORY  Chief Complaint Altered Mental Status    HPI Evelyn [name] is a 83 y.o. female coming from [place] with self-catheterization and a a history of recurrent UTI, aortic stenosis,paroxysmal A. Fib on Eliquis, CAD status post stents, CHF, presenting for altered mental status. Per report, at baseline the patient is "sharp as a wipe." This morning, she was found ambulating with no pants on. The patient does not know where she is, what year it is, nor why she is here. She denies any pain at this time.   Past Medical History:  Diagnosis Date  . Anemia    hx of blood transfusion  . Cancer ([place])    lymphomia  . Coarse tremors    head shakes if does not take propranolol  . Coronary artery disease   . GERD (gastroesophageal reflux disease)   . History of lymphoma   . Hyperlipidemia   . Hypertension   . Insomnia    takes remeron nightly  . Other chronic cystitis    Radiation , botox injections  . Self-catheterizes urinary bladder    twice daily  . Thyroid disease    had thyroid removed    Patient Active Problem List   Diagnosis Date Noted  . Diaper dermatitis 08/16/2016  . Decubitus ulcer of sacral region, stage 1 08/14/2016  . Urinary incontinence 06/07/2016  . Encounter for Medicare annual wellness exam 05/28/2016  . Inflamed external hemorrhoid 05/28/2016  . Aortic stenosis, mild 05/14/2016  . Hospital discharge follow-up 05/14/2016  . Skin benign neoplasm 01/26/2016  . Anorexia 06/22/2015  . Chronic diastolic heart failure ([place]) 03/04/2015  . Paroxysmal atrial fibrillation ([place]) 05/29/2014  . ESBL (extended spectrum beta-lactamase) producing bacteria infection 12/09/2013  . Unspecified vitamin D deficiency 11/25/2013  . Chronic insomnia  11/23/2013  . Bradycardia 04/10/2013  . Generalized muscle weakness 04/10/2013  . Anemia, iron deficiency 12/27/2012  . Anemia, pernicious 10/15/2011  . Spinal stenosis of lumbar region 10/15/2011  . Hypertension   . Hyperlipidemia   . Hypothyroidism   . Cystitis, chronic   . History of lymphoma     Past Surgical History:  Procedure Laterality Date  . ABDOMINAL HYSTERECTOMY  1970s  . APPENDECTOMY    . BACK SURGERY     lower back - bulging disk  . CARDIAC CATHETERIZATION  2005   2 cardiac stents  . CYSTOSCOPY  April 2013  . EYE SURGERY     bilateral cataracts  . JOINT REPLACEMENT  2012   right hip  . LUMBAR LAMINECTOMY/DECOMPRESSION MICRODISCECTOMY  01/24/2012   Procedure: LUMBAR LAMINECTOMY/DECOMPRESSION MICRODISCECTOMY 2 LEVELS;  Surgeon: Floyce Stakes, MD;  Location: [place] [facility];  Service: Neurosurgery;  Laterality: Right;  Right Lumbar four five, lumbar five sacral one foraminotomy   . [place] SURGERY  2008   [place]  . STOMACH SURGERY     removal all but small portion of stomach out due to ulcers    Current Outpatient Rx  . Order #: [id number] Class: Historical Med  . Order #: [id number] Class: Historical Med  . Order #: [id number] Class: Normal  . Order #: [id number] Class: Historical Med  . Order #: [id number] Class: Normal  . Order #: [id number] Class: Print  . Order #: [id number] Class: Historical Med  . Order #: [id number] Class: Print  . Order #: [id number] Class: Historical Med  . Order #:  [id number] Class: Historical Med  . Order #: [id number] Class: Historical Med  . Order #: [id number] Class: Print  . Order #: [id number] Class: Historical Med  . Order #: [id number] Class: Historical Med  . Order #: [id number] Class: Print  . Order #: [id number] Class: Historical Med  . Order #: [id number] Class: Historical Med  . Order #: [id number] Class: Print  . Order #: [id number] Class: Historical Med  . Order #: [id number] Class: Historical Med  . Order #: [id number] Class: Normal     Allergies Macrolides and ketolides; Oxycodone; Ciprofloxacin; Neomy-bacit-polymyx-pramoxine; Other; and Sulfa antibiotics  Family History  Problem Relation Age of Onset  . Diabetes Mother   . Heart disease Father   . Kidney disease Neg Hx   . Bladder Cancer Neg Hx     Social History Social History  Substance Use Topics  . Smoking status: Former Smoker    Quit date: 04/04/1971  . Smokeless tobacco: Never Used  . Alcohol use No    Review of Systems Unable to assess due to patient altered mental status . ____________________________________________   PHYSICAL EXAM:  VITAL SIGNS: ED Triage Vitals  Enc Vitals Group     BP 11/09/16 0904 (!) 126/59     Pulse Rate 11/09/16 0904 81     Resp 11/09/16 0904 15     Temp 11/09/16 0904 98 F (36.7 C)     Temp Source 11/09/16 0904 Oral     SpO2 11/09/16 0904 99 %     Weight 11/09/16 0900 125 lb (56.7 kg)     Height --      Head Circumference --      Peak Flow --      Pain Score --      Pain Loc --      Pain Edu? --      Excl. in [place]? --     Constitutional: Alert and oriented to person only; does not know the year, month, or where she is. She is in no acute distress, makes good eye contact and follows commands well. GCS is 15. Eyes: Conjunctivae are normal.  EOMI. No scleral icterus. No eye discharge. Head: Atraumatic. No raccoon eyes or Battle sign. Nose: No congestion/rhinnorhea. Mouth/Throat: Mucous membranes are moist.  Neck: No stridor.  Supple.  No JVD. No meningismus. Cardiovascular: Normal rate, regular rhythm. No murmurs, rubs or gallops.  Respiratory: Normal respiratory effort.  No accessory muscle use or retractions. Lungs CTAB.  No wheezes, rales or ronchi. Gastrointestinal: Soft, nontender and nondistended.  No guarding or rebound.  No peritoneal signs. Musculoskeletal: No LE edema. No ttp in the calves or palpable cords.  Negative Homan's sign. Neurologic: Alert and oriented 1. Speech is clear with a single  episode of mumbling to answer a question on my exam. Naming is intact. Face and smile symmetric. Tongue is midline. EOMI and PERRLA. No pronator drift. 5 out of 5 grip, biceps, triceps, hip flexors, plantar flexion and dorsiflexion. Normal sensation to light touch in the bilateral upper and lower extremities, and face.  Skin:  Skin is warm, dry. Psychiatric: Mood and affect are normal. ____________________________________________   LABS (all labs ordered are listed, but only abnormal results are displayed)  Labs Reviewed  CBC - Abnormal; Notable for the following:       Result Value   Hemoglobin 10.9 (*)    HCT 33.5 (*)    RDW 16.3 (*)    Platelets 524 (*)    All other components within normal limits  URINALYSIS, COMPLETE (UACMP) WITH MICROSCOPIC - Abnormal;  Notable for the following:    Color, Urine YELLOW (*)    APPearance CLOUDY (*)    Hgb urine dipstick SMALL (*)    Nitrite POSITIVE (*)    Leukocytes, UA LARGE (*)    Bacteria, UA MANY (*)    Squamous Epithelial / LPF 0-5 (*)    All other components within normal limits  COMPREHENSIVE METABOLIC PANEL  AMMONIA  TROPONIN I  TSH   ____________________________________________  EKG  ED ECG REPORT I, Eula Listen, the attending physician, personally viewed and interpreted this ECG.   Date: 11/09/2016  EKG Time: 905  Rate: 79  Rhythm: normal sinus rhythm  Axis: leftward  Intervals:none  ST&T Change: No STEMI  ____________________________________________  RADIOLOGY  Ct Head Wo Contrast  Result Date: 11/09/2016 CLINICAL DATA:  Altered mental status EXAM: CT HEAD WITHOUT CONTRAST TECHNIQUE: Contiguous axial images were obtained from the base of the skull through the vertex without intravenous contrast. COMPARISON:  10/03/2016 FINDINGS: Brain: No intracranial hemorrhage, mass effect or midline shift. Again noted extensive periventricular and subcortical white matter decreased attenuation probable due to chronic  small vessel ischemic changes. Stable lacunar infarcts in right basal ganglia and bilateral thalamus. Stable old infarct in right cerebellum. No definite acute cortical infarction. No mass lesion is noted on this unenhanced scan. Vascular: Atherosclerotic calcifications of carotid siphon. Skull: No skull fracture is noted. Sinuses/Orbits: There is extensive opacification with dense inspissated mucus left maxillary sinus probable due to chronic sinusitis. Fungal component cannot be excluded. There is partial opacification of the right maxillary sinus. Partial opacification bilateral ethmoid air cells. There is hypoplastic right frontal sinus. Other: None IMPRESSION: No acute intracranial abnormality. No definite acute cortical infarction. Stable cerebral atrophy. Stable extensive periventricular and subcortical chronic white matter disease. Old lacunar infarcts right basal ganglia and bilateral thalamus are stable. Again noted old infarct in right cerebellum. Extensive paranasal sinuses disease as described above. Electronically Signed   By: Lahoma Crocker M.D.   On: 11/09/2016 09:52    ____________________________________________   PROCEDURES  Procedure(s) performed: None  Procedures  Critical Care performed: No ____________________________________________   INITIAL IMPRESSION / ASSESSMENT AND PLAN / ED COURSE  Pertinent labs & imaging results that were available during my care of the patient were reviewed by me and considered in my medical decision making (see chart for details).  83 y.o. female with multiple chronic illnesses, recurrent UTIs and requiring self-catheterization, presenting for altered mental status. On my examination, the patient is alert to person only but otherwise has no focal findings. I'm concerned about UTI but will do a full altered mental status workup including CT head, LFTs, ammonia, and cardiac workup. The patient will require admission for further evaluation and  treatment.  ----------------------------------------- 10:03 AM on 11/09/2016 -----------------------------------------  The patient is positive for urinary tract) infection and I have sent a culture. Based on her previous microbiology, I have chosen Rocephin for her intravenous treatment. She'll be admitted to the hospital for further treatment.  ____________________________________________  FINAL CLINICAL IMPRESSION(S) / ED DIAGNOSES  Final diagnoses:  Acute cystitis without hematuria  Disorientation         NEW MEDICATIONS STARTED DURING THIS VISIT:  New Prescriptions   No medications on file      Eula Listen, MD 11/09/16 1004

## 2016-11-09 NOTE — ED Notes (Signed)
Patient was incontinent to urine. Patient was cleaned, linens were changed and brief was placed on the patient prior to transport to the floor.

## 2016-11-09 NOTE — Progress Notes (Signed)
Advanced Care Plan.  Purpose of Encounter: : Code Status. Parties in Attendance: The patient, her son and me. Patient's Decisional Capacity: NO Medical Story: The patient was found altered mental status and UTI. She is being admitted for recurrent UTI and started IV antibiotics. I discussed the patient's CODE STATUS with patient's son. Her son, who is her POA, said she wants resuscitation one half years ago. Full code for now.  Plan:  Code Status: Full code for now, palliative care consult.  Time spent discussing advance care planning: 17 minutes.

## 2016-11-09 NOTE — NC FL2 (Signed)
[place] LEVEL OF CARE SCREENING TOOL     IDENTIFICATION  Patient Name: Evelyn [name] Birthdate: 1919-12-04 Sex: female Admission Date (Current Location): 11/09/2016  [place] and [place] Number:  Engineering geologist and Address:  [facility], [address], [place], [place] [address]      Provider Number: [id number]  Attending Physician Name and Address:  Demetrios Loll, MD  Relative Name and Phone Number:       Current Level of Care: Hospital Recommended Level of Care: [facility]) Prior Approval Number:    Date Approved/Denied:   PASRR Number:  ([id number])  Discharge Plan: Other (Comment) [facility])    Current Diagnoses: Patient Active Problem List   Diagnosis Date Noted  . UTI (urinary tract infection) 11/09/2016  . Diaper dermatitis 08/16/2016  . Decubitus ulcer of sacral region, stage 1 08/14/2016  . Urinary incontinence 06/07/2016  . Encounter for Medicare annual wellness exam 05/28/2016  . Inflamed external hemorrhoid 05/28/2016  . Aortic stenosis, mild 05/14/2016  . Hospital discharge follow-up 05/14/2016  . Skin benign neoplasm 01/26/2016  . Anorexia 06/22/2015  . Chronic diastolic heart failure ([place]) 03/04/2015  . Paroxysmal atrial fibrillation ([place]) 05/29/2014  . ESBL (extended spectrum beta-lactamase) producing bacteria infection 12/09/2013  . Unspecified vitamin D deficiency 11/25/2013  . Chronic insomnia 11/23/2013  . Bradycardia 04/10/2013  . Generalized muscle weakness 04/10/2013  . Anemia, iron deficiency 12/27/2012  . Anemia, pernicious 10/15/2011  . Spinal stenosis of lumbar region 10/15/2011  . Hypertension   . Hyperlipidemia   . Hypothyroidism   . Cystitis, chronic   . History of lymphoma     Orientation RESPIRATION BLADDER Height & Weight     Self  Normal Continent Weight: 125 lb (56.7 kg) Height:  5\' 6"  (167.6 cm)  BEHAVIORAL SYMPTOMS/MOOD NEUROLOGICAL  BOWEL NUTRITION STATUS   (None. )  (None. ) Continent Diet (Diet: Heart Room )  AMBULATORY STATUS COMMUNICATION OF NEEDS Skin   Limited Assist Verbally Normal                       Personal Care Assistance Level of Assistance  Bathing, Feeding, Dressing Bathing Assistance: Limited assistance Feeding assistance: Independent Dressing Assistance: Limited assistance     Functional Limitations Info  Sight, Hearing, Speech Sight Info: Adequate Hearing Info: Impaired Speech Info: Adequate    SPECIAL CARE FACTORS FREQUENCY  PT (By licensed PT), OT (By licensed OT)                    Contractures      Additional Factors Info  Code Status, Allergies Code Status Info:  (Full Code) Allergies Info:  (Macrolides And Ketolides, Oxycodone, Ciprofloxacin, Neomy-bacit-polymyx-pramoxine, Other, Sulfa Antibiotics)           Current Medications (11/09/2016):  This is the current hospital active medication list Current Facility-Administered Medications  Medication Dose Route Frequency Provider Last Rate Last Dose  . 0.9 %  sodium chloride infusion   Intravenous Continuous Demetrios Loll, MD 50 mL/hr at 11/09/16 1341    . acetaminophen (TYLENOL) tablet 650 mg  650 mg Oral Q6H PRN Demetrios Loll, MD       Or  . acetaminophen (TYLENOL) suppository 650 mg  650 mg Rectal Q6H PRN Demetrios Loll, MD      . acidophilus (RISAQUAD) capsule 1 capsule  1 capsule Oral Daily Demetrios Loll, MD      . albuterol (PROVENTIL) (2.5  MG/3ML) 0.083% nebulizer solution 2.5 mg  2.5 mg Nebulization Q2H PRN Demetrios Loll, MD      . amLODipine (NORVASC) tablet 10 mg  10 mg Oral Daily Demetrios Loll, MD      . apixaban [facility]) tablet 2.5 mg  2.5 mg Oral Q12H Demetrios Loll, MD      . bisacodyl (DULCOLAX) EC tablet 5 mg  5 mg Oral Daily PRN Demetrios Loll, MD      . Derrill Memo ON 11/10/2016] cefTRIAXone (ROCEPHIN) 1 g in dextrose 5 % 50 mL IVPB  1 g Intravenous Q24H Demetrios Loll, MD      . conjugated estrogens (PREMARIN) vaginal cream 1 Applicatorful  1  Applicatorful Vaginal QHS Demetrios Loll, MD      . dextromethorphan (DELSYM) 30 MG/5ML liquid 30 mg  30 mg Oral Q12H PRN Demetrios Loll, MD      . diphenhydrAMINE (BENADRYL) capsule 50 mg  50 mg Oral QHS Demetrios Loll, MD      . Derrill Memo ON 11/10/2016] levothyroxine (SYNTHROID, LEVOTHROID) tablet 100 mcg  100 mcg Oral Daily Demetrios Loll, MD      . loperamide (IMODIUM) capsule 4 mg  4 mg Oral PRN Demetrios Loll, MD      . mirtazapine (REMERON) tablet 7.5 mg  7.5 mg Oral QHS Demetrios Loll, MD      . ondansetron [facility]) tablet 4 mg  4 mg Oral Q6H PRN Demetrios Loll, MD       Or  . ondansetron [facility]) injection 4 mg  4 mg Intravenous Q6H PRN Demetrios Loll, MD      . oxybutynin (DITROPAN-XL) 24 hr tablet 5 mg  5 mg Oral QHS Demetrios Loll, MD      . Derrill Memo ON 11/10/2016] pantoprazole (PROTONIX) EC tablet 40 mg  40 mg Oral Daily Demetrios Loll, MD      . propranolol (INDERAL) tablet 30 mg  30 mg Oral BID Demetrios Loll, MD      . senna-docusate (Senokot-S) tablet 1 tablet  1 tablet Oral QHS PRN Demetrios Loll, MD      . vitamin B-12 (CYANOCOBALAMIN) tablet 1,000 mcg  1,000 mcg Oral Daily Demetrios Loll, MD      . zolpidem (AMBIEN) tablet 5 mg  5 mg Oral QHS PRN Demetrios Loll, MD         Discharge Medications: Please see discharge summary for a list of discharge medications.  Relevant Imaging Results:  Relevant Lab Results:   Additional Information  (SSN: [id number])  Danie Chandler, Student-Social Work

## 2016-11-09 NOTE — Progress Notes (Signed)
Admission:  Patient alert and oriented to self. Patient very hard of hearing. Son [name]) said she lost her hearing aides a while ago. Patient also wears glasses that she left at [facility]. Patient not complaining of any pain. Heart and lung sounds normal. Patient has some confusion at baseline but has been up walking independently with a walker at the assisted living facility.   Deri Fuelling, RN

## 2016-11-09 NOTE — Progress Notes (Signed)
Chaplain provided education regarding an advance directives. One of the patient's family members was concerned why the hospital encouraged her relative to do a health care power of attorney as if she was about to die. Chaplain explain that AD is something people do while they are still alive. Ch explained that some people do not wait to make an AD while they are sick but this hospital offers this service to patients.    11/09/16 1700  Clinical Encounter Type  Visited With Patient and family together  Visit Type Initial  Referral From Nurse  Consult/Referral To Chaplain  Spiritual Encounters  Spiritual Needs Literature;Brochure

## 2016-11-09 NOTE — Clinical Social Work Note (Addendum)
Clinical Social Work Assessment  Patient Details  Name: Evelyn [name] MRN: [id number] Date of Birth: 05-01-1920  Date of referral:  11/09/16               Reason for consult:  Facility Placement, Discharge Planning                Permission sought to share information with:  Evelyn [name] granted to share information::  Yes, Verbal Permission Granted  Name::      Evelyn [name]  Agency::   [name]   Relationship::     Contact Information:     Housing/Transportation Living arrangements for the past 2 months:  [name] ([name]) Source of Information:  Patient, Adult Children Patient Interpreter Needed:  None Criminal Activity/Legal Involvement Pertinent to Current Situation/Hospitalization:  No - Comment as needed Significant Relationships:  Adult Children Lives with:  Facility Resident ([name]) Do you feel safe going back to the place where you live?  Yes Need for family participation in patient care:  Yes (Comment)  Care giving concerns:  Patient is a resident at [name] (fax: [phone])   Social Worker assessment / plan:  Social work Theatre manager noted that patient was from facility. PT has not worked patient at this time. Social work Theatre manager met with patient alone at bedside. Patient was sitting up in bed eating lunch. Patient appeared confused. Social work Theatre manager called son Evelyn [name] to get more information about patient. Per Evelyn [name], patient is a resident at [name] and has been there since October of 2016. Patient has two sons, Evelyn [name] and Evelyn [name] that live in the area. Patient does not have HPOA as this time. Per son, patient uses a walker to ambulate and is not on oxygen at facility. Son Evelyn [name] verbally agreed he wanted patient to return to facility. Social work Mudlogger with Evelyn [name] at [facility] and waiting on a call back. Social work Theatre manager will continue to assist and follow as needed.   CSW spoke  with Evelyn Haw LPN/ Occupational psychologist at [name]. Per [name] patient has been at [name] for over 1 year now and uses a rollaider at baseline. Per [name] patient is on room air at baseline. Per [facility]elyn [name] patient can return to [name] when stable and her son Evelyn [name] is the primary contact.   FL2 completed.   Employment status:  Unemployed Nurse, adult PT Recommendations:  Not assessed at this time Information / Referral to community resources:  Other (Comment Required) ([name])  Patient/Family's Response to care:  Patient's son is agreeable for patient to return to [name].   Patient/Family's Understanding of and Emotional Response to Diagnosis, Current Treatment, and Prognosis:  Patient and patient's son was pleasant and thanked social work Theatre manager for calling.   Emotional Assessment Appearance:  Appears stated age Attitude/Demeanor/Rapport:    Affect (typically observed):  Accepting, Adaptable, Appropriate Orientation:  Oriented to Self Alcohol / Substance use:  Not Applicable Psych involvement (Current and /or in the community):  No (Comment)  Discharge Needs  Concerns to be addressed:  Basic Needs Readmission within the last 30 days:  No Current discharge risk:  Dependent with Mobility, Chronically ill Barriers to Discharge:  Continued Medical Work up   [employer], [place] Work 11/09/2016, 2:16 PM

## 2016-11-09 NOTE — Care Management Obs Status (Signed)
[place] NOTIFICATION   Patient Details  Name: Evelyn [name] MRN: [id number] Date of Birth: 08/25/1919   Medicare Observation Status Notification Given:  Yes    Jolly Mango, RN 11/09/2016, 3:15 PM

## 2016-11-10 DIAGNOSIS — I10 Essential (primary) hypertension: Secondary | ICD-10-CM | POA: Diagnosis not present

## 2016-11-10 DIAGNOSIS — N390 Urinary tract infection, site not specified: Secondary | ICD-10-CM | POA: Diagnosis not present

## 2016-11-10 DIAGNOSIS — F0390 Unspecified dementia without behavioral disturbance: Secondary | ICD-10-CM | POA: Diagnosis not present

## 2016-11-10 DIAGNOSIS — R4182 Altered mental status, unspecified: Secondary | ICD-10-CM | POA: Diagnosis not present

## 2016-11-10 LAB — CBC
HEMATOCRIT: 29.6 % — AB (ref 35.0–47.0)
HEMOGLOBIN: 10.2 g/dL — AB (ref 12.0–16.0)
MCH: 28.4 pg (ref 26.0–34.0)
MCHC: 34.3 g/dL (ref 32.0–36.0)
MCV: 82.8 fL (ref 80.0–100.0)
Platelets: 438 10*3/uL (ref 150–440)
RBC: 3.58 MIL/uL — AB (ref 3.80–5.20)
RDW: 16 % — ABNORMAL HIGH (ref 11.5–14.5)
WBC: 10.2 10*3/uL (ref 3.6–11.0)

## 2016-11-10 LAB — BASIC METABOLIC PANEL
ANION GAP: 5 (ref 5–15)
BUN: 12 mg/dL (ref 6–20)
CALCIUM: 8.4 mg/dL — AB (ref 8.9–10.3)
CHLORIDE: 108 mmol/L (ref 101–111)
CO2: 27 mmol/L (ref 22–32)
Creatinine, Ser: 0.89 mg/dL (ref 0.44–1.00)
GFR calc non Af Amer: 54 mL/min — ABNORMAL LOW (ref >60)
GLUCOSE: 96 mg/dL (ref 65–99)
POTASSIUM: 3.4 mmol/L — AB (ref 3.5–5.1)
Sodium: 140 mmol/L (ref 135–145)

## 2016-11-10 MED ORDER — ENSURE ENLIVE PO LIQD
237.0000 mL | Freq: Three times a day (TID) | ORAL | Status: DC
  Administered 2016-11-10 – 2016-11-15 (×13): 237 mL via ORAL

## 2016-11-10 MED ORDER — DIPHENHYDRAMINE HCL 25 MG PO CAPS
25.0000 mg | ORAL_CAPSULE | Freq: Every evening | ORAL | Status: DC | PRN
  Administered 2016-11-10: 25 mg via ORAL
  Filled 2016-11-10: qty 1

## 2016-11-10 MED ORDER — POTASSIUM CHLORIDE CRYS ER 20 MEQ PO TBCR
40.0000 meq | EXTENDED_RELEASE_TABLET | ORAL | Status: AC
  Administered 2016-11-10 (×2): 40 meq via ORAL
  Filled 2016-11-10 (×2): qty 2

## 2016-11-10 NOTE — Progress Notes (Signed)
Clinical Education officer, museum (CSW) contacted Estate agent at [facility] and made her aware that PT is recommending home health and per MD patient will likely be ready for D/C tomorrow. Per Lattie Haw [facility] is their first preference RN case manager aware of above. Per Lattie Haw patient can return to [facility] when medically stable. CSW will continue to follow and assist as needed.   [employer], LCSW [phone]

## 2016-11-10 NOTE — Progress Notes (Signed)
Initial Nutrition Assessment  DOCUMENTATION CODES:   Severe malnutrition in context of chronic illness  INTERVENTION:  Recommend liberalizing diet from Heart Healthy to Regular in setting of severe malnutrition.  Provide Ensure Enlive po TID, each supplement provides 350 kcal and 20 grams of protein. Patient prefers chocolate.  NUTRITION DIAGNOSIS:   Malnutrition (Severe) related to chronic illness (inadequate intake with chronic UTIs) as evidenced by severe depletion of body fat, severe depletion of muscle mass.  GOAL:   Patient will meet greater than or equal to 90% of their needs  MONITOR:   PO intake, Supplement acceptance, Labs, Weight trends, I & O's  REASON FOR ASSESSMENT:   Consult Poor PO  ASSESSMENT:   83 year old female with PMHx of HLD, thyroid disease, lymphoma, CAD, GERD, HTN, recurrent UTI presents from [facility] with AMS found to have UTI.   Patient very hard of hearing (lost hearing aids) and is also not able to provide much history. Spoke with patient's son and granddaughter at bedside. Patient has had a poor appetite for a long time. Son reports her ALF told him she has not been eating well at all, but have not specified exactly how much of her meals she is finishing. He reports she does not drink any oral nutrition supplements there because an RD has not been following patient and they have been told there is nowhere to store the drinks in her room.  UBW around 125-129 lbs and denies any weight loss. Son is surprised patient has not lost weight since she has not been eating well.   Medications reviewed and include: acidophilus 1 capsule daily, ceftriaxone, levothyroxine, Remeron 7.5 mg daily, pantoprazole, vitamin B12 1000 micrograms daily.  Labs reviewed: Potassium 3.4.   Nutrition-Focused physical exam completed. Findings are severe fat depletion, severe muscle depletion, and no edema.   Diet Order:  Diet Heart Room service  appropriate? Yes; Fluid consistency: Thin  Skin:  Wound (see comment) (MSAD to groin and buttocks)  Last BM:  11/09/2016  Height:   Ht Readings from Last 1 Encounters:  11/09/16 5\' 6"  (1.676 m)    Weight:   Wt Readings from Last 1 Encounters:  11/09/16 125 lb (56.7 kg)    Ideal Body Weight:  59.1 kg  BMI:  Body mass index is 20.18 kg/m.  Estimated Nutritional Needs:   Kcal:  1190-1390 (MSJ x 1.2-1.4)  Protein:  70-80 grams (1.2-1.4 grams/kg)  Fluid:  1.4 L/day (25 ml/kg)  EDUCATION NEEDS:   No education needs identified at this time  Willey Blade, MS, RD, LDN Pager: [phone] After Hours Pager: [phone]

## 2016-11-10 NOTE — Evaluation (Signed)
Physical Therapy Evaluation Patient Details Name: Evelyn [name] MRN: [id number] DOB: [birth year] Today's Date: 11/10/2016   History of Present Illness  Evelyn [name] is a 83 y.o. female with a known history of recurrent UTI, hypertension, hyperlipidemia, and anemia, A. Fib on Eliquis and CAD. The patient was sent from [facility] to the ED due to altered mental status. Her urinalysis showed a UTI. Previous urine culture showed Klebsiella pneumonia, sensitive to Rocephin. So ED physician started antibiotics. Pt is now admitted under observation status for UTI and acute metabolic encephalopathy.   Clinical Impression  Pt admitted with above diagnosis. Pt currently with functional limitations due to the deficits listed below (see PT Problem List). Pt demonstrates bed mobility and transfers which is close to her baseline function per son. She moves slower than normal during ambulation. She is reasonably steady with UE support on rolling walker during ambulation. Pt is AOx1 at time of evaluation and unable to contribute much to history. Pt is safe to return to [facility] and family is in agreement. Recommend HH PT at facility due to decline from baseline ambulation however family is unsure whether they will agree due to financial concerns. Encouraged family to address these considers with care manager. Pt will benefit from skilled PT services to address deficits in strength, balance, and mobility in order to return to full function at home.     Follow Up Recommendations Home health PT;Other (comment) (Family unsure if they would like PT at facility or not)    Equipment Recommendations  None recommended by PT    Recommendations for Other Services       Precautions / Restrictions Precautions Precautions: Fall Restrictions Weight Bearing Restrictions: No      Mobility  Bed Mobility Overal bed mobility: Needs Assistance Bed Mobility: Supine to Sit     Supine to sit: Supervision     General bed mobility comments: Pt uses bed rails and HOB elevated. Moves slowly but able to perform without external assistance. Requires bed positioning to scoot up toward [facility]  Transfers Overall transfer level: Needs assistance Equipment used: Rolling walker (2 wheeled) Transfers: Sit to/from Stand Sit to Stand: Min guard         General transfer comment: Transfers slowly but is steady without external support. Safe hand placement demonstrated  Ambulation/Gait Ambulation/Gait assistance: Min guard Ambulation Distance (Feet): 80 Feet Assistive device: Rolling walker (2 wheeled) Gait Pattern/deviations: Decreased step length - right;Decreased step length - left;Decreased stance time - right;Decreased weight shift to right Gait velocity: Decreased Gait velocity interpretation: <1.8 ft/sec, indicative of risk for recurrent falls General Gait Details: Pt with R antalgic gait with decreased stance time on R. Son reports this is due to R hip injury. She moves slowly but is steady with UE support. No respiratory distress noted. Good scanning of environment. Minimally communicative during ambulation. Significant forward kyphosis noted  Stairs            Wheelchair Mobility    Modified Rankin (Stroke Patients Only)       Balance Overall balance assessment: Needs assistance Sitting-balance support: No upper extremity supported Sitting balance-Leahy Scale: Good Sitting balance - Comments: No deficits identified   Standing balance support: Bilateral upper extremity supported Standing balance-Leahy Scale: Fair Standing balance comment: Pt able to maintain standing balance with consistent UE support  Pertinent Vitals/Pain Pain Assessment: Faces Faces Pain Scale: No hurt    Home Living Family/patient expects to be discharged to:: Assisted living               Home Equipment: Walker - 4 wheels      Prior Function Level of  Independence: Needs assistance   Gait / Transfers Assistance Needed: Independent for facility ambulation with rollator. Son reports she has slid off the bed multiple times but no falls from standing or during walking  ADL's / Homemaking Assistance Needed: Assist from staff at [facility] for ADLs/IADLs        Hand Dominance   Dominant Hand: Right    Extremity/Trunk Assessment   Upper Extremity Assessment Upper Extremity Assessment: Generalized weakness    Lower Extremity Assessment Lower Extremity Assessment: Generalized weakness       Communication   Communication: HOH  Cognition Arousal/Alertness: Awake/alert Behavior During Therapy: Flat affect Overall Cognitive Status: Impaired/Different from baseline Area of Impairment: Orientation                 Orientation Level: Disoriented to;Place;Time;Situation             General Comments: Son reports she is normally AOx3 but might require the newspaper for day of the week      General Comments      Exercises     Assessment/Plan    PT Assessment Patient needs continued PT services  PT Problem List Decreased strength;Decreased balance;Decreased mobility       PT Treatment Interventions DME instruction;Gait training;Functional mobility training;Therapeutic activities;Therapeutic exercise;Balance training;Neuromuscular re-education;Patient/family education    PT Goals (Current goals can be found in the Care Plan section)  Acute Rehab PT Goals Patient Stated Goal: Family would like for patient to maintain as much independence at facility as possible PT Goal Formulation: With family Time For Goal Achievement: 11/24/16 Potential to Achieve Goals: Fair    Frequency Min 2X/week   Barriers to discharge        Co-evaluation               End of Session Equipment Utilized During Treatment: Gait belt Activity Tolerance: Patient tolerated treatment well Patient left: in bed;with call bell/phone within  reach;with bed alarm set;with family/visitor present   PT Visit Diagnosis: Unsteadiness on feet (R26.81);Muscle weakness (generalized) (M62.81)    Time: 0370-4888 PT Time Calculation (min) (ACUTE ONLY): 24 min   Charges:   PT Evaluation $PT Eval Low Complexity: 1 Procedure PT Treatments $Therapeutic Exercise: 8-22 mins   PT G Codes:   PT G-Codes **NOT FOR INPATIENT CLASS** Functional Assessment Tool Used: AM-PAC 6 Clicks Basic Mobility Functional Limitation: Mobility: Walking and moving around Mobility: Walking and Moving Around Current Status (B1694): At least 40 percent but less than 60 percent impaired, limited or restricted Mobility: Walking and Moving Around Goal Status [phone]): At least 20 percent but less than 40 percent impaired, limited or restricted   Phillips Grout PT, DPT    Yen Wandell 11/10/2016, 9:59 AM

## 2016-11-10 NOTE — Care Management Note (Signed)
Case Management Note  Patient Details  Name: Evelyn [name] MRN: [id number] Date of Birth: Nov 17, 1919  Subjective/Objective:  PT recommending home with home PT. Spoke with patient's son and discussed PT recommendations He is concerned with patients level of consciousness. He doesn't feel patient will participate in home PT. Refused home health. Case closed.                 Action/Plan:   Expected Discharge Date:                  Expected Discharge Plan:  [place]  In-House Referral:     Discharge planning Services  CM Consult  Post Acute Care Choice:  Home Health Choice offered to:  Adult Children  DME Arranged:    DME Agency:     HH Arranged:  PT, Patient Refused [place] Agency:     Status of Service:     If discussed at [place] of Stay Meetings, dates discussed:    Additional Comments:  Jolly Mango, RN 11/10/2016, 2:11 PM

## 2016-11-10 NOTE — Progress Notes (Addendum)
Palliative Medicine consult noted. Due to high referral volume, there will be a delay seeing this patient. A PMT provider will return Monday, April 16, and the patient will be evaluated then. Please call the Palliative Medicine Team office at [phone] if recommendations are needed in the interim.  Thank you for inviting [place] to see this patient.  Marjie Skiff Hildy Nicholl, RN, BSN, [facility] 11/10/2016 2:20 PM Cell [phone] 8:00-4:00 Monday-Friday Office [phone]

## 2016-11-10 NOTE — Progress Notes (Signed)
Advance care planning  Discussed with son at bedside. Patient is presently confused. Does not have designated healthcare power of attorney. She has 3 sons.  Her son mentions that he does not want to make decisions alone. Once his siblings to be involved. In the past she has told them that she would not 1 any artificial life support. But during the last admission she wanted to be resuscitated. Although he believes this was when she could not understand the question well. His time patient is confused and he wants her discussed with her when she is back to baseline.  We discussed regarding early dementia and its progression with time.  Time spent 20 minutes.

## 2016-11-10 NOTE — Plan of Care (Signed)
Problem: Education: Goal: Knowledge of treatment and prevention of UTI/Pyleonephritis will improve Outcome: Progressing RN and MD has educated patient's son on how UTI's can cause confusion and older adults. Patient's concerned about patient mental status. Patient is alert and oriented only to self. Mental status has not declined sense admission. Son is now aware that UTI's may cause confusions and confusion may get better as UTI clears up.   Deri Fuelling, RN

## 2016-11-10 NOTE — Progress Notes (Signed)
[facility] at [place] NAME: Makensey Rego    MR#:  [id number]  DATE OF BIRTH:  05-13-1920  SUBJECTIVE:  CHIEF COMPLAINT:   Chief Complaint  Patient presents with  . Altered Mental Status   Patient is presently confused. History obtained from son. He thinks patient is still confused as she was in the emergency room.  At baseline she lives in assisted living facility. Ambulates with a walker. Has been having problems with memory.  REVIEW OF SYSTEMS:    Review of Systems  Unable to perform ROS: Dementia   DRUG ALLERGIES:   Allergies  Allergen Reactions  . Macrolides And Ketolides Other (See Comments)  . Oxycodone Nausea Only  . Ciprofloxacin Swelling, Rash and Other (See Comments)  . Neomy-Bacit-Polymyx-Pramoxine Rash    "All antibiotics"  . Other Rash    "All antibiotics"  . Sulfa Antibiotics Swelling, Rash and Other (See Comments)    VITALS:  Blood pressure (!) 120/55, pulse 73, temperature 98.2 F (36.8 C), temperature source Oral, resp. rate 18, height 5\' 6"  (1.676 m), weight 56.7 kg (125 lb), SpO2 99 %.  PHYSICAL EXAMINATION:   Physical Exam  GENERAL:  83 y.o.-year-old patient lying in the bed with no acute distress.  Decreased hearing EYES: Pupils equal, round, reactive to light and accommodation. No scleral icterus. Extraocular muscles intact.  HEENT: Head atraumatic, normocephalic. Oropharynx and nasopharynx clear.  NECK:  Supple, no jugular venous distention. No thyroid enlargement, no tenderness.  LUNGS: Normal breath sounds bilaterally, no wheezing, rales, rhonchi. No use of accessory muscles of respiration.  CARDIOVASCULAR: S1, S2 normal. No murmurs, rubs, or gallops.  ABDOMEN: Soft, nontender, nondistended. Bowel sounds present. No organomegaly or mass.  EXTREMITIES: No cyanosis, clubbing or edema b/l.    NEUROLOGIC: Cranial nerves II through XII are intact. No focal Motor or sensory deficits b/l.    PSYCHIATRIC: The patient is alert and Awake. Pleasantly confused. SKIN: No obvious rash, lesion, or ulcer.   LABORATORY PANEL:   CBC  Recent Labs Lab 11/10/16 0412  WBC 10.2  HGB 10.2*  HCT 29.6*  PLT 438   ------------------------------------------------------------------------------------------------------------------ Chemistries   Recent Labs Lab 11/09/16 0921 11/10/16 0412  NA 139 140  K 3.4* 3.4*  CL 104 108  CO2 28 27  GLUCOSE 115* 96  BUN 12 12  CREATININE 0.89 0.89  CALCIUM 8.9 8.4*  AST 18  --   ALT 12*  --   ALKPHOS 78  --   BILITOT 0.2*  --    ------------------------------------------------------------------------------------------------------------------  Cardiac Enzymes  Recent Labs Lab 11/09/16 0921  TROPONINI <0.03   ------------------------------------------------------------------------------------------------------------------  RADIOLOGY:  Dg Chest 2 View  Result Date: 11/09/2016 CLINICAL DATA:  Paroxysms mole atrial fibrillation, aortic stenosis, coronary artery disease with stent placement, CHF. Acute mental status change. The patient is on anticoagulants. Remote history of smoking. EXAM: CHEST  2 VIEW COMPARISON:  PA and lateral chest x-ray of July 13, 2016 FINDINGS: The lungs are adequately inflated and clear. The heart and pulmonary vascularity are normal. There is calcification in the wall of the thoracic aorta. Coronary artery calcifications are visible as well. There is no pleural effusion. The bony thorax exhibits no acute abnormality. IMPRESSION: There is no acute cardiopulmonary abnormality. There are calcifications in the coronary arteries and in the thoracic aorta. Electronically Signed   By: David  Martinique M.D.   On: 11/09/2016 10:13   Ct Head Wo Contrast  Result Date: 11/09/2016 CLINICAL DATA:  Altered mental status EXAM: CT HEAD WITHOUT CONTRAST TECHNIQUE: Contiguous axial images were obtained from the base of the skull  through the vertex without intravenous contrast. COMPARISON:  10/03/2016 FINDINGS: Brain: No intracranial hemorrhage, mass effect or midline shift. Again noted extensive periventricular and subcortical white matter decreased attenuation probable due to chronic small vessel ischemic changes. Stable lacunar infarcts in right basal ganglia and bilateral thalamus. Stable old infarct in right cerebellum. No definite acute cortical infarction. No mass lesion is noted on this unenhanced scan. Vascular: Atherosclerotic calcifications of carotid siphon. Skull: No skull fracture is noted. Sinuses/Orbits: There is extensive opacification with dense inspissated mucus left maxillary sinus probable due to chronic sinusitis. Fungal component cannot be excluded. There is partial opacification of the right maxillary sinus. Partial opacification bilateral ethmoid air cells. There is hypoplastic right frontal sinus. Other: None IMPRESSION: No acute intracranial abnormality. No definite acute cortical infarction. Stable cerebral atrophy. Stable extensive periventricular and subcortical chronic white matter disease. Old lacunar infarcts right basal ganglia and bilateral thalamus are stable. Again noted old infarct in right cerebellum. Extensive paranasal sinuses disease as described above. Electronically Signed   By: Lahoma Crocker M.D.   On: 11/09/2016 09:52     ASSESSMENT AND PLAN:   * UTI with Acute encephalopathy over baseline early dementia On IV antibiotics. Ordered urine cultures. Not sent from the ER. Afebrile and normal WBC count. Patient will likely have worsening inpatient delirium the longer she stays here. Discussed with son. Possible discharge tomorrow on by mouth antibiotics.  * Paroxysmal atrial fibrillation. On propranolol and Eliquis.  * Hypertension. Continue home medications.  * Hypokalemia. Replace orally.  All the records are reviewed and case discussed with Care Management/Social Workerr. Management  plans discussed with the patient, family and they are in agreement.  CODE STATUS: FULL CODE  TOTAL TIME TAKING CARE OF THIS PATIENT: 35 minutes.   POSSIBLE D/C IN 1-2 DAYS, DEPENDING ON CLINICAL CONDITION.  Hillary Bow R M.D on 11/10/2016 at 12:26 PM  Between 7am to 6pm - Pager - [phone]  After 6pm go to [email or web address] - password [email or web address] [facility]  Office  [phone]  CC: Primary care physician; Crecencio Mc, MD  Note: This dictation was prepared with Dragon dictation along with smaller phrase technology. Any transcriptional errors that result from this process are unintentional.

## 2016-11-11 DIAGNOSIS — I480 Paroxysmal atrial fibrillation: Secondary | ICD-10-CM | POA: Diagnosis not present

## 2016-11-11 DIAGNOSIS — I10 Essential (primary) hypertension: Secondary | ICD-10-CM | POA: Diagnosis not present

## 2016-11-11 DIAGNOSIS — G9340 Encephalopathy, unspecified: Secondary | ICD-10-CM | POA: Diagnosis not present

## 2016-11-11 DIAGNOSIS — N390 Urinary tract infection, site not specified: Secondary | ICD-10-CM | POA: Diagnosis not present

## 2016-11-11 LAB — MAGNESIUM: Magnesium: 1.8 mg/dL (ref 1.7–2.4)

## 2016-11-11 MED ORDER — POTASSIUM CHLORIDE 20 MEQ PO PACK
40.0000 meq | PACK | Freq: Once | ORAL | Status: AC
  Administered 2016-11-11: 40 meq via ORAL
  Filled 2016-11-11: qty 2

## 2016-11-11 MED ORDER — POTASSIUM CHLORIDE 20 MEQ PO PACK: 40.0000 meq | PACK | Freq: Once | ORAL | Status: DC

## 2016-11-11 NOTE — Progress Notes (Addendum)
[facility] at [place] NAME: Evelyn [name]    MR#:  [id number]  DATE OF BIRTH:  1919/10/26  SUBJECTIVE:  CHIEF COMPLAINT:   Chief Complaint  Patient presents with  . Altered Mental Status   Patient is Nonverbal, however, answers questions by nodding or shaking her head, not obviously confused, answers are appropriate. Denies and discomfort, pain  At baseline she lives in assisted living facility. Ambulates with a walker. Has been having problems with memory.  REVIEW OF SYSTEMS:    Review of Systems  Unable to perform ROS: Dementia   DRUG ALLERGIES:   Allergies  Allergen Reactions  . Macrolides And Ketolides Other (See Comments)  . Oxycodone Nausea Only  . Ciprofloxacin Swelling, Rash and Other (See Comments)  . Neomy-Bacit-Polymyx-Pramoxine Rash    "All antibiotics"  . Other Rash    "All antibiotics"  . Sulfa Antibiotics Swelling, Rash and Other (See Comments)    VITALS:  Blood pressure (!) 113/52, pulse 71, temperature 97.8 F (36.6 C), temperature source Oral, resp. rate 16, height 5\' 6"  (1.676 m), weight 56.7 kg (125 lb), SpO2 100 %.  PHYSICAL EXAMINATION:   Physical Exam  GENERAL:  83 y.o.-year-old patient lying in the bed with no acute distress.  Decreased hearing EYES: Pupils equal, round, reactive to light and accommodation. No scleral icterus. Extraocular muscles intact.  HEENT: Head atraumatic, normocephalic. Oropharynx and nasopharynx clear.  NECK:  Supple, no jugular venous distention. No thyroid enlargement, no tenderness.  LUNGS: Normal breath sounds bilaterally, no wheezing, rales, rhonchi. No use of accessory muscles of respiration.  CARDIOVASCULAR: S1, S2 normal. No murmurs, rubs, or gallops.  ABDOMEN: Soft, nontender, nondistended. Bowel sounds present. No organomegaly or mass.  EXTREMITIES: No cyanosis, clubbing or edema b/l.    NEUROLOGIC: Cranial nerves II through XII are intact. No focal Motor or  sensory deficits b/l.   PSYCHIATRIC: The patient is alert and Awake. Pleasantly confused. SKIN: No obvious rash, lesion, or ulcer.   LABORATORY PANEL:   CBC  Recent Labs Lab 11/10/16 0412  WBC 10.2  HGB 10.2*  HCT 29.6*  PLT 438   ------------------------------------------------------------------------------------------------------------------ Chemistries   Recent Labs Lab 11/09/16 0921 11/10/16 0412 11/11/16 0803  NA 139 140  --   K 3.4* 3.4*  --   CL 104 108  --   CO2 28 27  --   GLUCOSE 115* 96  --   BUN 12 12  --   CREATININE 0.89 0.89  --   CALCIUM 8.9 8.4*  --   MG  --   --  1.8  AST 18  --   --   ALT 12*  --   --   ALKPHOS 78  --   --   BILITOT 0.2*  --   --    ------------------------------------------------------------------------------------------------------------------  Cardiac Enzymes  Recent Labs Lab 11/09/16 0921  TROPONINI <0.03   ------------------------------------------------------------------------------------------------------------------  RADIOLOGY:  No results found.   ASSESSMENT AND PLAN:   * UTI due to 100,000 colony-forming units of gram-negative rods, continue IV antibiotics for now, blood cultures were not taken, MRSA PCR is negative. Ordered urine cultures. Not sent from the ER. Afebrile and normal WBC count.  *Acute encephalopathy over baseline dementia, supportive therapy,change diet to soft. Patient will likely have worsening inpatient delirium the longer she stays here. Discussed with son. Possible discharge tomorrow on by mouth antibiotics when sensitivities are known.  * Paroxysmal atrial fibrillation. On propranolol and Eliquis.heart rate is  well controlled  * Hypertension. Continue home medications, blood pressure is well controlled.  * Hypokalemia. Replacing orally today, again.Magnesium is normal  *anemia, stable since yesterday  All the records are reviewed and case discussed with Care Management/Social  Workerr. Management plans discussed with the patient, family and they are in agreement.  CODE STATUS: FULL CODE  TOTAL TIME TAKING CARE OF THIS PATIENT: 35 minutes.  Discussed with patient's son, updated medical condition, treatment plan, all questions were answered POSSIBLE D/C IN 1-2 DAYS, DEPENDING ON CLINICAL CONDITION.  Theodoro Grist M.D on 11/11/2016 at 2:19 PM  Between 7am to 6pm - Pager - [phone]  After 6pm go to [email or web address] - password [email or web address] [facility]  Office  [phone]  CC: Primary care physician; Crecencio Mc, MD  Note: This dictation was prepared with Dragon dictation along with smaller phrase technology. Any transcriptional errors that result from this process are unintentional.

## 2016-11-12 DIAGNOSIS — G250 Essential tremor: Secondary | ICD-10-CM | POA: Diagnosis not present

## 2016-11-12 DIAGNOSIS — Z87440 Personal history of urinary (tract) infections: Secondary | ICD-10-CM | POA: Diagnosis not present

## 2016-11-12 DIAGNOSIS — I4891 Unspecified atrial fibrillation: Secondary | ICD-10-CM | POA: Diagnosis not present

## 2016-11-12 DIAGNOSIS — Z955 Presence of coronary angioplasty implant and graft: Secondary | ICD-10-CM | POA: Diagnosis not present

## 2016-11-12 DIAGNOSIS — I480 Paroxysmal atrial fibrillation: Secondary | ICD-10-CM | POA: Diagnosis not present

## 2016-11-12 DIAGNOSIS — E876 Hypokalemia: Secondary | ICD-10-CM | POA: Diagnosis not present

## 2016-11-12 DIAGNOSIS — R531 Weakness: Secondary | ICD-10-CM | POA: Diagnosis not present

## 2016-11-12 DIAGNOSIS — Z7901 Long term (current) use of anticoagulants: Secondary | ICD-10-CM | POA: Diagnosis not present

## 2016-11-12 DIAGNOSIS — I2510 Atherosclerotic heart disease of native coronary artery without angina pectoris: Secondary | ICD-10-CM | POA: Diagnosis not present

## 2016-11-12 DIAGNOSIS — Z515 Encounter for palliative care: Secondary | ICD-10-CM | POA: Diagnosis not present

## 2016-11-12 DIAGNOSIS — E785 Hyperlipidemia, unspecified: Secondary | ICD-10-CM | POA: Diagnosis not present

## 2016-11-12 DIAGNOSIS — Z882 Allergy status to sulfonamides status: Secondary | ICD-10-CM | POA: Diagnosis not present

## 2016-11-12 DIAGNOSIS — I10 Essential (primary) hypertension: Secondary | ICD-10-CM | POA: Diagnosis not present

## 2016-11-12 DIAGNOSIS — R4182 Altered mental status, unspecified: Secondary | ICD-10-CM | POA: Diagnosis not present

## 2016-11-12 DIAGNOSIS — I110 Hypertensive heart disease with heart failure: Secondary | ICD-10-CM | POA: Diagnosis not present

## 2016-11-12 DIAGNOSIS — F0390 Unspecified dementia without behavioral disturbance: Secondary | ICD-10-CM | POA: Diagnosis not present

## 2016-11-12 DIAGNOSIS — E46 Unspecified protein-calorie malnutrition: Secondary | ICD-10-CM | POA: Diagnosis not present

## 2016-11-12 DIAGNOSIS — I482 Chronic atrial fibrillation: Secondary | ICD-10-CM | POA: Diagnosis not present

## 2016-11-12 DIAGNOSIS — G4700 Insomnia, unspecified: Secondary | ICD-10-CM | POA: Diagnosis not present

## 2016-11-12 DIAGNOSIS — Z888 Allergy status to other drugs, medicaments and biological substances status: Secondary | ICD-10-CM | POA: Diagnosis not present

## 2016-11-12 DIAGNOSIS — Z7189 Other specified counseling: Secondary | ICD-10-CM | POA: Diagnosis not present

## 2016-11-12 DIAGNOSIS — N3000 Acute cystitis without hematuria: Secondary | ICD-10-CM | POA: Diagnosis not present

## 2016-11-12 DIAGNOSIS — Z8249 Family history of ischemic heart disease and other diseases of the circulatory system: Secondary | ICD-10-CM | POA: Diagnosis not present

## 2016-11-12 DIAGNOSIS — Z66 Do not resuscitate: Secondary | ICD-10-CM | POA: Diagnosis not present

## 2016-11-12 DIAGNOSIS — N390 Urinary tract infection, site not specified: Secondary | ICD-10-CM | POA: Diagnosis not present

## 2016-11-12 DIAGNOSIS — Z452 Encounter for adjustment and management of vascular access device: Secondary | ICD-10-CM | POA: Diagnosis not present

## 2016-11-12 DIAGNOSIS — F329 Major depressive disorder, single episode, unspecified: Secondary | ICD-10-CM | POA: Diagnosis not present

## 2016-11-12 DIAGNOSIS — G9340 Encephalopathy, unspecified: Secondary | ICD-10-CM | POA: Diagnosis not present

## 2016-11-12 DIAGNOSIS — Z87891 Personal history of nicotine dependence: Secondary | ICD-10-CM | POA: Diagnosis not present

## 2016-11-12 DIAGNOSIS — Z90710 Acquired absence of both cervix and uterus: Secondary | ICD-10-CM | POA: Diagnosis not present

## 2016-11-12 DIAGNOSIS — E039 Hypothyroidism, unspecified: Secondary | ICD-10-CM | POA: Diagnosis not present

## 2016-11-12 DIAGNOSIS — I5032 Chronic diastolic (congestive) heart failure: Secondary | ICD-10-CM | POA: Diagnosis not present

## 2016-11-12 DIAGNOSIS — Z8572 Personal history of non-Hodgkin lymphomas: Secondary | ICD-10-CM | POA: Diagnosis not present

## 2016-11-12 DIAGNOSIS — G9341 Metabolic encephalopathy: Secondary | ICD-10-CM | POA: Diagnosis not present

## 2016-11-12 DIAGNOSIS — Z96641 Presence of right artificial hip joint: Secondary | ICD-10-CM | POA: Diagnosis not present

## 2016-11-12 DIAGNOSIS — Z1612 Extended spectrum beta lactamase (ESBL) resistance: Secondary | ICD-10-CM | POA: Diagnosis not present

## 2016-11-12 DIAGNOSIS — Z833 Family history of diabetes mellitus: Secondary | ICD-10-CM | POA: Diagnosis not present

## 2016-11-12 DIAGNOSIS — K219 Gastro-esophageal reflux disease without esophagitis: Secondary | ICD-10-CM | POA: Diagnosis not present

## 2016-11-12 DIAGNOSIS — Z881 Allergy status to other antibiotic agents status: Secondary | ICD-10-CM | POA: Diagnosis not present

## 2016-11-12 DIAGNOSIS — Z7401 Bed confinement status: Secondary | ICD-10-CM | POA: Diagnosis not present

## 2016-11-12 DIAGNOSIS — Z885 Allergy status to narcotic agent status: Secondary | ICD-10-CM | POA: Diagnosis not present

## 2016-11-12 LAB — POTASSIUM: POTASSIUM: 4.2 mmol/L (ref 3.5–5.1)

## 2016-11-12 MED ORDER — SODIUM CHLORIDE 0.9 % IV SOLN
500.0000 mg | Freq: Three times a day (TID) | INTRAVENOUS | Status: DC
  Filled 2016-11-12 (×2): qty 0.5

## 2016-11-12 MED ORDER — KCL IN DEXTROSE-NACL 10-5-0.45 MEQ/L-%-% IV SOLN
INTRAVENOUS | Status: DC
  Administered 2016-11-12 – 2016-11-14 (×5): via INTRAVENOUS
  Filled 2016-11-12 (×10): qty 1000

## 2016-11-12 MED ORDER — MEROPENEM-SODIUM CHLORIDE 500 MG/50ML IV SOLR
500.0000 mg | Freq: Three times a day (TID) | INTRAVENOUS | Status: DC
  Administered 2016-11-12 – 2016-11-14 (×5): 500 mg via INTRAVENOUS
  Filled 2016-11-12 (×8): qty 50

## 2016-11-12 NOTE — Progress Notes (Signed)
[facility] [place] NAME: Evelyn [name]    MR#:  [id number]  DATE OF BIRTH:  Dec 22, 1919  SUBJECTIVE:  CHIEF COMPLAINT:   Chief Complaint  Patient presents with  . Altered Mental Status   Patient remains somewhat somnolent, however, wakes up and sits up whenever patient's family arrives. He will to communicate briefly with BSO. No answers to simple questions, when spoken loud straight to her ear. She has family is concerned about her mental status change, continue somnolence. Urine cultures come back as Escherichia coli, sensitivities are pending. Patient had received Rocephin for the past 2 days. Remains afebrile, however, temperature was 99.2 yesterday in the afternoon. At baseline she lives in assisted living facility. Ambulates with a walker. Has been having problems with memory.  REVIEW OF SYSTEMS:    Review of Systems  Unable to perform ROS: Mental acuity   DRUG ALLERGIES:   Allergies  Allergen Reactions  . Macrolides And Ketolides Other (See Comments)  . Oxycodone Nausea Only  . Ciprofloxacin Swelling, Rash and Other (See Comments)  . Neomy-Bacit-Polymyx-Pramoxine Rash    "All antibiotics"  . Other Rash    "All antibiotics"  . Sulfa Antibiotics Swelling, Rash and Other (See Comments)    VITALS:  Blood pressure 124/62, pulse 86, temperature 98.3 F (36.8 C), temperature source Oral, resp. rate 18, height 5\' 6"  (1.676 m), weight 56.7 kg (125 lb), SpO2 97 %.  PHYSICAL EXAMINATION:   Physical Exam  GENERAL:  83 y.o.-year-old patient lying in the bed with no acute distress. Opens her eyes briefly, converses when asked questions , very loud and close. No confusion was noted during evaluation Decreased hearing EYES: Pupils equal, round, reactive to light and accommodation. No scleral icterus. Extraocular muscles intact.  HEENT: Head atraumatic, normocephalic. Oropharynx and nasopharynx clear.  NECK:  Supple, no jugular venous  distention. No thyroid enlargement, no tenderness.  LUNGS: Normal breath sounds bilaterally, no wheezing, rales, rhonchi. No use of accessory muscles of respiration.  CARDIOVASCULAR: S1, S2 normal. No murmurs, rubs, or gallops.  ABDOMEN: Soft, nontender, nondistended. Bowel sounds present. No organomegaly or mass.  EXTREMITIES: No cyanosis, clubbing or edema b/l.    NEUROLOGIC: Cranial nerves II through XII are intact. No focal Motor or sensory deficits b/l.   PSYCHIATRIC: The patient is alert and Awake. Pleasantly confused. SKIN: No obvious rash, lesion, or ulcer.   LABORATORY PANEL:   CBC  Recent Labs Lab 11/10/16 0412  WBC 10.2  HGB 10.2*  HCT 29.6*  PLT 438   ------------------------------------------------------------------------------------------------------------------ Chemistries   Recent Labs Lab 11/09/16 0921 11/10/16 0412 11/11/16 0803 11/12/16 0405  NA 139 140  --   --   K 3.4* 3.4*  --  4.2  CL 104 108  --   --   CO2 28 27  --   --   GLUCOSE 115* 96  --   --   BUN 12 12  --   --   CREATININE 0.89 0.89  --   --   CALCIUM 8.9 8.4*  --   --   MG  --   --  1.8  --   AST 18  --   --   --   ALT 12*  --   --   --   ALKPHOS 78  --   --   --   BILITOT 0.2*  --   --   --    ------------------------------------------------------------------------------------------------------------------  Cardiac Enzymes  Recent  Labs Lab 11/09/16 0921  TROPONINI <0.03   ------------------------------------------------------------------------------------------------------------------  RADIOLOGY:  No results found.   ASSESSMENT AND PLAN:   * UTI due to Due to Escherichia coli, sensitivities are pending, change in the buttock to meropenem, as no significant improvement on Rocephin, blood cultures were not taken, MRSA PCR is negative.  Afebrile/ MAXIMUM TEMPERATURE 99.2 yesterday afternoon/ and normal WBC count. Discussed with patient's family extensively, all questions were  answered  *Acute encephalopathy over baseline dementia, supportive therapy, continue soft diet.  Discussed with son. Discharge  when Escherichia coli sensitivities are known. Get physical therapist evaluation.  * Paroxysmal atrial fibrillation. On propranolol and Eliquis.heart rate is well controlled  * Hypertension. Continue home medications, blood pressure is well controlled.  * Hypokalemia. Resolved..Magnesium was normal  *anemia, stable   *Generalized weakness, get physical therapist evaluation   All the records are reviewed and case discussed with Care Management/Social Workerr. Management plans discussed with the patient, family and they are in agreement.  CODE STATUS: FULL CODE  TOTAL TIME TAKING CARE OF THIS PATIENT: 35 minutes.  Discussed with multiple family members , updated medical condition, treatment plan, all questions were answered POSSIBLE D/C IN 1-2 DAYS, DEPENDING ON CLINICAL CONDITION.  Theodoro Grist M.D on 11/12/2016 at 2:58 PM  Between 7am to 6pm - Pager - [phone]  After 6pm go to [email or web address] - password [email or web address] [facility]  Office  [phone]  CC: Primary care physician; Crecencio Mc, MD  Note: This dictation was prepared with Dragon dictation along with smaller phrase technology. Any transcriptional errors that result from this process are unintentional.

## 2016-11-13 DIAGNOSIS — N3000 Acute cystitis without hematuria: Principal | ICD-10-CM

## 2016-11-13 DIAGNOSIS — Z515 Encounter for palliative care: Secondary | ICD-10-CM

## 2016-11-13 DIAGNOSIS — Z7189 Other specified counseling: Secondary | ICD-10-CM

## 2016-11-13 LAB — URINE CULTURE
Culture: >=100000 — AB
SPECIAL REQUESTS: NORMAL

## 2016-11-13 LAB — CREATININE, SERUM
Creatinine, Ser: 0.88 mg/dL (ref 0.44–1.00)
GFR calc non Af Amer: 55 mL/min — ABNORMAL LOW (ref >60)

## 2016-11-13 LAB — CBC
HEMATOCRIT: 32.5 % — AB (ref 35.0–47.0)
Hemoglobin: 10.9 g/dL — ABNORMAL LOW (ref 12.0–16.0)
MCH: 28 pg (ref 26.0–34.0)
MCHC: 33.7 g/dL (ref 32.0–36.0)
MCV: 83.1 fL (ref 80.0–100.0)
PLATELETS: 459 10*3/uL — AB (ref 150–440)
RBC: 3.91 MIL/uL (ref 3.80–5.20)
RDW: 16.2 % — AB (ref 11.5–14.5)
WBC: 9.6 10*3/uL (ref 3.6–11.0)

## 2016-11-13 MED ORDER — SODIUM CHLORIDE 0.9% FLUSH: 10.0000 mL | INTRAVENOUS | Status: DC | PRN

## 2016-11-13 MED ORDER — SODIUM CHLORIDE 0.9% FLUSH
10.0000 mL | Freq: Two times a day (BID) | INTRAVENOUS | Status: DC
  Administered 2016-11-13: 10 mL

## 2016-11-13 NOTE — Progress Notes (Signed)
Peripherally Inserted Central Catheter/Midline Placement  The IV Nurse has discussed with the patient and/or persons authorized to consent for the patient, the purpose of this procedure and the potential benefits and risks involved with this procedure.  The benefits include less needle sticks, lab draws from the catheter, and the patient may be discharged home with the catheter. Risks include, but not limited to, infection, bleeding, blood clot (thrombus formation), and puncture of an artery; nerve damage and irregular heartbeat and possibility to perform a PICC exchange if needed/ordered by physician.  Alternatives to this procedure were also discussed.  Bard Power PICC patient education guide, fact sheet on infection prevention and patient information card has been provided to patient /or left at bedside.    PICC/Midline Placement Documentation  PICC Single Lumen 21/11/73 PICC Right Basilic 38 cm 3 cm (Active)  Indication for Insertion or Continuance of Line Home intravenous therapies (PICC only) 11/13/2016  1:00 PM  Exposed Catheter (cm) 3 cm 11/13/2016  1:00 PM  Dressing Change Due 11/20/16 11/13/2016  1:00 PM       Jule Economy Horton 11/13/2016, 1:12 PM

## 2016-11-13 NOTE — Progress Notes (Signed)
PT Cancellation Note  Patient Details Name: Evelyn [name] MRN: [id number] DOB: 01-27-1920   Cancelled Treatment:    Reason Eval/Treat Not Completed: Patient declined, no reason specified   Offered and encouraged x 2 this pm.  Pt declined both sessions.  Will continue as appropriate.   Chesley Noon 11/13/2016, 4:28 PM

## 2016-11-13 NOTE — Consult Note (Signed)
Consultation Note Date: 11/13/2016   Patient Name: Evelyn [name]  DOB: 1919/09/23  MRN: [id number]  Age / Sex: 83 y.o., female  PCP: Crecencio Mc, MD Referring Physician: Theodoro Grist, MD  Reason for Consultation: Establishing goals of care and specifically address code status in elderly female with declining health.   HPI/Patient Profile: 83 y.o. female  with past medical history of recurrent UTI, chronic cystitis, h/o lymphoma, anemia, CAD, GERD, HTN, HLD, hypothyroidism (thyroid removed), insomnia (took [place] for years and now on remeron) admitted on 11/09/2016 with confusion r/t UTI.   Clinical Assessment and Goals of Care: I met today at [facility] with her son, [name]. She is very hard of hearing and continues to be somewhat confused. Knows who she is and that she is in the hospital but does not know what hospital or any other facts of her situation. I spoke more with [name] who has worked as an Public relations account executive in the past and has had some knowledge about medicine and interventions. He has noticed gradual decline over the past year. He is aware that she will likely pass from one of her UTIs and has been concerned with her slow progress this admission.    We spent much time discussing DNR and resuscitation. He tells me that she has consistently in the past not desired aggressive intervention and he feels that she has been clear she has had a full life and would be prepared when she reaches EOL. He plans to discuss DNR with his brothers and then give me an answer when we meet again tomorrow afternoon.   Therapeutic listening and emotional support provided.   Primary Decision Maker NEXT OF KIN son [name] is primary surrogate although not documented HCPOA and he wishes to make decisions with his other 2 brothers    SUMMARY OF RECOMMENDATIONS   - Family to decide wishes for DNR - Treat the treatable - Continue  antibiotics as indicated to treat UTI  Code Status/Advance Care Planning:  Full code - considering DNR   Symptom Management:   Eating lunch well.   No pain/discomfort.   Palliative Prophylaxis:   Bowel Regimen, Delirium Protocol, Frequent Pain Assessment and Turn Reposition  Additional Recommendations (Limitations, Scope, Preferences):  Avoid Hospitalization  Psycho-social/Spiritual:   Desire for further Chaplaincy support:no  Additional Recommendations: Caregiving  Support/Resources and Education on Hospice  Prognosis:   Unable to determine  Discharge Planning: [place] for rehab with Palliative care service follow-up      Primary Diagnoses: Present on Admission: . UTI (urinary tract infection)   I have reviewed the medical record, interviewed the patient and family, and examined the patient. The following aspects are pertinent.  Past Medical History:  Diagnosis Date  . Anemia    hx of blood transfusion  . Cancer ([place])    lymphomia  . Coarse tremors    head shakes if does not take propranolol  . Coronary artery disease   . GERD (gastroesophageal reflux disease)   . History of lymphoma   .  Hyperlipidemia   . Hypertension   . Hypothyroidism   . Insomnia    takes remeron nightly  . Neuromuscular disorder ([place])   . Other chronic cystitis    Radiation , botox injections  . Self-catheterizes urinary bladder    twice daily  . Thyroid disease    had thyroid removed   Social History   Social History  . Marital status: Widowed    Spouse name: N/A  . Number of children: N/A  . Years of education: N/A   Social History Main Topics  . Smoking status: Former Smoker    Quit date: 04/04/1971  . Smokeless tobacco: Never Used  . Alcohol use No  . Drug use: No  . Sexual activity: Not Asked   Other Topics Concern  . None   Social History Narrative  . None   Family History  Problem Relation Age of Onset  . Diabetes Mother   . Heart  disease Father   . Kidney disease Neg Hx   . Bladder Cancer Neg Hx    Scheduled Meds: . acidophilus  1 capsule Oral Daily  . amLODipine  10 mg Oral Daily  . apixaban  2.5 mg Oral Q12H  . conjugated estrogens  1 Applicatorful Vaginal QHS  . feeding supplement (ENSURE ENLIVE)  237 mL Oral TID BM  . levothyroxine  100 mcg Oral Daily  . meropenem  500 mg Intravenous Q8H  . mirtazapine  7.5 mg Oral QHS  . oxybutynin  5 mg Oral QHS  . pantoprazole  40 mg Oral Daily  . potassium chloride  40 mEq Oral Once  . propranolol  30 mg Oral BID  . vitamin B-12  1,000 mcg Oral Daily   Continuous Infusions: . dextrose 5 % and 0.45 % NaCl with KCl 10 mEq/L 100 mL/hr at 11/13/16 0212   PRN Meds:.acetaminophen **OR** acetaminophen, albuterol, bisacodyl, dextromethorphan, diphenhydrAMINE, loperamide, ondansetron **OR** ondansetron (ZOFRAN) IV, senna-docusate, zolpidem Allergies  Allergen Reactions  . Macrolides And Ketolides Other (See Comments)  . Oxycodone Nausea Only  . Ciprofloxacin Swelling, Rash and Other (See Comments)  . Neomy-Bacit-Polymyx-Pramoxine Rash    "All antibiotics"  . Other Rash    "All antibiotics"  . Sulfa Antibiotics Swelling, Rash and Other (See Comments)   Review of Systems  Unable to perform ROS: Dementia    Physical Exam  Constitutional: She appears well-developed.  Elderly, thin, frail  HENT:  Head: Normocephalic and atraumatic.  Cardiovascular: Normal rate.   Pulmonary/Chest: Effort normal. No accessory muscle usage. No tachypnea. No respiratory distress.  Abdominal: Soft. Normal appearance.  Neurological: She is alert. She is disoriented.  Nursing note and vitals reviewed.   Vital Signs: BP 130/61 (BP Location: Left Arm)   Pulse 76   Temp 97.8 F (36.6 C) (Oral)   Resp 18   Ht 5' 6"  (1.676 m)   Wt 56.7 kg (125 lb)   SpO2 100%   BMI 20.18 kg/m  Pain Assessment: No/denies pain POSS *See Group Information*: 1-Acceptable,Awake and alert Pain Score:  0-No pain   SpO2: SpO2: 100 % O2 Device:SpO2: 100 % O2 Flow Rate: .O2 Flow Rate (L/min): 2 L/min  IO: Intake/output summary:  Intake/Output Summary (Last 24 hours) at 11/13/16 1150 Last data filed at 11/13/16 0212  Gross per 24 hour  Intake             1885 ml  Output                0 ml  Net             1885 ml    LBM: Last BM Date: 11/11/16 Baseline Weight: Weight: 56.7 kg (125 lb) Most recent weight: Weight: 56.7 kg (125 lb)     Palliative Assessment/Data: PPS: 30%     Time Total: 31mn  Greater than 50%  of this time was spent counseling and coordinating care related to the above assessment and plan.  Signed by: AVinie Sill NP Palliative Medicine Team Pager # [phone](M-F 8a-5p) Team Phone # [phone](Nights/Weekends)

## 2016-11-13 NOTE — Progress Notes (Signed)
Chaplain responded to OR for Pt in Rm 143. OR stated palliative care coming Monday 16. [place] met with Pt who responded to the [facility] questions by nodding heard and making simple statement such as yes or okay. Pt was alert but non-verbal. After meeting with Pt, [place] talked to Pt's nurse to find out about Pt's status. Nurse stated that palliative council to meet today. [place] provided support and presence.    11/13/16 1000  Clinical Encounter Type  Visited With Patient  Visit Type Initial  Referral From Nurse  Consult/Referral To Chaplain  Spiritual Encounters  Spiritual Needs Other (Comment)

## 2016-11-13 NOTE — Clinical Social Work Placement (Signed)
   CLINICAL SOCIAL WORK PLACEMENT  NOTE  Date:  11/13/2016  Patient Details  Name: Evelyn [name] MRN: [id number] Date of Birth: 1919-08-31  Clinical Social Work is seeking post-discharge placement for this patient at the [place] level of care (*CSW will initial, date and re-position this form in  chart as items are completed):  Yes   Patient/family provided with [place] Work Department's list of facilities offering this level of care within the geographic area requested by the patient (or if unable, by the patient's family).  Yes   Patient/family informed of their freedom to choose among providers that offer the needed level of care, that participate in Medicare, Medicaid or managed care program needed by the patient, have an available bed and are willing to accept the patient.  Yes   Patient/family informed of [facility]'s ownership interest in [facility] and [facility], as well as of the fact that they are under no obligation to receive care at these facilities.  PASRR submitted to EDS on       PASRR number received on       Existing PASRR number confirmed on 11/13/16     FL2 transmitted to all facilities in geographic area requested by pt/family on 11/13/16     FL2 transmitted to all facilities within larger geographic area on       Patient informed that his/her managed care company has contracts with or will negotiate with certain facilities, including the following:            Patient/family informed of bed offers received.  Patient chooses bed at       Physician recommends and patient chooses bed at      Patient to be transferred to   on  .  Patient to be transferred to facility by       Patient family notified on   of transfer.  Name of family member notified:        PHYSICIAN       Additional Comment:    _______________________________________________ Tanee Henery, Veronia Beets, LCSW 11/13/2016, 5:06 PM

## 2016-11-13 NOTE — Progress Notes (Signed)
Per MD patient will require a PICC line for IV Abx due to ESBL in her urine. Clinical Education officer, museum (CSW) met with patient's son Evelyn [name] and made him aware of above. CSW explained to son that [facility] will not be able to manage a PICC line and patient will have to go to a SNF. Son is agreeable to SNF search in [place]. Per son he will likely provide transportation for patient to SNF. Health Team case manager is aware of above. CSW will continue to follow and assist as needed.   [employer], LCSW [phone]

## 2016-11-13 NOTE — Progress Notes (Signed)
[place] at [place] NAME: Evelyn [name]    MR#:  [id number]  DATE OF BIRTH:  07/13/1920  SUBJECTIVE:  CHIEF COMPLAINT:   Chief Complaint  Patient presents with  . Altered Mental Status   Patient Is more alert today, opens her eyes and converses easier, denies pain, discomfort Urine cultures come back as Escherichia coli, ESBL, now on meropenem. PICC line is going to be placed today to receive antibiotic therapy for 6 more days to complete 7 day course. Physical therapist evaluation is pending At baseline she lives in assisted living facility. Ambulates with a walker. Has been having problems with memory.  REVIEW OF SYSTEMS:    Review of Systems  Unable to perform ROS: Mental acuity   DRUG ALLERGIES:   Allergies  Allergen Reactions  . Macrolides And Ketolides Other (See Comments)  . Oxycodone Nausea Only  . Ciprofloxacin Swelling, Rash and Other (See Comments)  . Neomy-Bacit-Polymyx-Pramoxine Rash    "All antibiotics"  . Other Rash    "All antibiotics"  . Sulfa Antibiotics Swelling, Rash and Other (See Comments)    VITALS:  Blood pressure 130/61, pulse 76, temperature 97.8 F (36.6 C), temperature source Oral, resp. rate 18, height 5\' 6"  (1.676 m), weight 56.7 kg (125 lb), SpO2 100 %.  PHYSICAL EXAMINATION:   Physical Exam  GENERAL:  83 y.o.-year-old patient lying in the bed with no acute distress. Opens Eyes, answers simple questions, seemed to be more alert today Decreased hearing EYES: Pupils equal, round, reactive to light and accommodation. No scleral icterus. Extraocular muscles intact.  HEENT: Head atraumatic, normocephalic. Oropharynx and nasopharynx clear.  NECK:  Supple, no jugular venous distention. No thyroid enlargement, no tenderness.  LUNGS: Normal breath sounds bilaterally, no wheezing, rales, rhonchi. No use of accessory muscles of respiration.  CARDIOVASCULAR: S1, S2 normal. No murmurs, rubs, or gallops.   ABDOMEN: Soft, nontender, nondistended. Bowel sounds present. No organomegaly or mass.  EXTREMITIES: No cyanosis, clubbing or edema b/l.    NEUROLOGIC: Cranial nerves II through XII are intact. No focal Motor or sensory deficits b/l.   PSYCHIATRIC: The patient is alert and Awake.  SKIN: No obvious rash, lesion, or ulcer.   LABORATORY PANEL:   CBC  Recent Labs Lab 11/13/16 0326  WBC 9.6  HGB 10.9*  HCT 32.5*  PLT 459*   ------------------------------------------------------------------------------------------------------------------ Chemistries   Recent Labs Lab 11/09/16 0921 11/10/16 0412 11/11/16 0803 11/12/16 0405 11/13/16 0326  NA 139 140  --   --   --   K 3.4* 3.4*  --  4.2  --   CL 104 108  --   --   --   CO2 28 27  --   --   --   GLUCOSE 115* 96  --   --   --   BUN 12 12  --   --   --   CREATININE 0.89 0.89  --   --  0.88  CALCIUM 8.9 8.4*  --   --   --   MG  --   --  1.8  --   --   AST 18  --   --   --   --   ALT 12*  --   --   --   --   ALKPHOS 78  --   --   --   --   BILITOT 0.2*  --   --   --   --    ------------------------------------------------------------------------------------------------------------------  Cardiac Enzymes  Recent Labs Lab 11/09/16 0921  TROPONINI <0.03   ------------------------------------------------------------------------------------------------------------------  RADIOLOGY:  No results found.   ASSESSMENT AND PLAN:   * UTI due to Due to . ESBL Escherichia coli, place PICC line and continue meropenem,  blood cultures were not taken, MRSA PCR was negative.  Afebrile/ MAXIMUM TEMPERATURE 99.2 yesterday afternoon/ and normal WBC count. Discussed with patient's family extensively, all questions were answered. Family is updated .  *Acute encephalopathy over baseline dementia, , improved clinically today, continue supportive therapy, soft diet.  Discussed with  . Awaiting for  physical  therapist evaluation.Possible  discharge tomorrow  * Paroxysmal atrial fibrillation. On propranolol and Eliquis.heart rate is well controlled  * Hypertension. Continue home medications, blood pressure is well controlled.  * Hypokalemia. Resolved..Magnesium was normal  *anemia, stable   *Generalized weakness, get physical therapist evaluationMy recommendations are pending. Likely discharge tomorrow.    All the records are reviewed and case discussed with Care Management/Social Workerr. Management plans discussed with the patient, family and they are in agreement.  CODE STATUS: FULL CODE  TOTAL TIME TAKING CARE OF THIS PATIENT: 30 minutes.  Discussed with patient's granddaughter, all questions were answered, attempted to call patient's son, unavailable   POSSIBLE D/C IN 1-2 DAYS, DEPENDING ON CLINICAL CONDITION.  Theodoro Grist M.D on 11/13/2016 at 1:14 PM  Between 7am to 6pm - Pager - [phone]  After 6pm go to [email or web address] - password [email or web address] [facility]  Office  [phone]  CC: Primary care physician; Crecencio Mc, MD  Note: This dictation was prepared with Dragon dictation along with smaller phrase technology. Any transcriptional errors that result from this process are unintentional.

## 2016-11-13 NOTE — Progress Notes (Signed)
IV RN as bedside placing PICC Line per MD order with POA consent.   Patient observed by this RN resting comfortably in no acute distress. Medications and detailed assessment to be completed once procedure has been completed.

## 2016-11-13 NOTE — Progress Notes (Signed)
Patient pulled out IV accidentally.  Unable to reinsert X4 attempts.  Will wait for IV team. Barbaraann Faster, RN 5:23 AM 11/13/2016

## 2016-11-13 NOTE — NC FL2 (Signed)
[place] LEVEL OF CARE SCREENING TOOL     IDENTIFICATION  Patient Name: Evelyn [name] Birthdate: 1920/05/22 Sex: female Admission Date (Current Location): 11/09/2016  Reader and [place] Number:  Engineering geologist and Address:  [facility], [address] [place], [place] [address]      Provider Number: [id number]  Attending Physician Name and Address:  Theodoro Grist, MD  Relative Name and Phone Number:       Current Level of Care: Hospital Recommended Level of Care: [place]  Prior Approval Number:    Date Approved/Denied:   PASRR Number:  [id number])  Discharge Plan: [place]     Current Diagnoses: Patient Active Problem List   Diagnosis Date Noted  . UTI (urinary tract infection) 11/09/2016  . Diaper dermatitis 08/16/2016  . Decubitus ulcer of sacral region, stage 1 08/14/2016  . Urinary incontinence 06/07/2016  . Encounter for Medicare annual wellness exam 05/28/2016  . Inflamed external hemorrhoid 05/28/2016  . Aortic stenosis, mild 05/14/2016  . Hospital discharge follow-up 05/14/2016  . Skin benign neoplasm 01/26/2016  . Anorexia 06/22/2015  . Chronic diastolic heart failure ([place]) 03/04/2015  . Paroxysmal atrial fibrillation ([place]) 05/29/2014  . ESBL (extended spectrum beta-lactamase) producing bacteria infection 12/09/2013  . Unspecified vitamin D deficiency 11/25/2013  . Chronic insomnia 11/23/2013  . Bradycardia 04/10/2013  . Generalized muscle weakness 04/10/2013  . Anemia, iron deficiency 12/27/2012  . Anemia, pernicious 10/15/2011  . Spinal stenosis of lumbar region 10/15/2011  . Hypertension   . Hyperlipidemia   . Hypothyroidism   . Cystitis, chronic   . History of lymphoma     Orientation RESPIRATION BLADDER Height & Weight     Self  Normal Continent Weight: 125 lb (56.7 kg) Height:  5\' 6"  (167.6 cm)  BEHAVIORAL SYMPTOMS/MOOD NEUROLOGICAL BOWEL NUTRITION  STATUS   (None. )  (None. ) Continent Diet (Diet: Heart Room )  AMBULATORY STATUS COMMUNICATION OF NEEDS Skin   Limited Assist Verbally Normal                       Personal Care Assistance Level of Assistance  Bathing, Feeding, Dressing Bathing Assistance: Limited assistance Feeding assistance: Independent Dressing Assistance: Limited assistance     Functional Limitations Info  Sight, Hearing, Speech Sight Info: Adequate Hearing Info: Impaired Speech Info: Adequate    SPECIAL CARE FACTORS FREQUENCY  PT (By licensed PT), OT (By licensed OT)    5 days per week                Contractures      Additional Factors Info  Code Status, Allergies Code Status Info:  (Full Code) Allergies Info:  (Macrolides And Ketolides, Oxycodone, Ciprofloxacin, Neomy-bacit-polymyx-pramoxine, Other, Sulfa Antibiotics)  On contact for ESBL in Urine            Current Medications (11/13/2016):  This is the current hospital active medication list Current Facility-Administered Medications  Medication Dose Route Frequency Provider Last Rate Last Dose  . acetaminophen (TYLENOL) tablet 650 mg  650 mg Oral Q6H PRN Demetrios Loll, MD       Or  . acetaminophen (TYLENOL) suppository 650 mg  650 mg Rectal Q6H PRN Demetrios Loll, MD      . acidophilus (RISAQUAD) capsule 1 capsule  1 capsule Oral Daily Demetrios Loll, MD   1 capsule at 11/13/16 1440  . albuterol (PROVENTIL) (2.5 MG/3ML) 0.083% nebulizer solution 2.5 mg  2.5 mg  Nebulization Q2H PRN Demetrios Loll, MD      . amLODipine (NORVASC) tablet 10 mg  10 mg Oral Daily Demetrios Loll, MD   10 mg at 11/12/16 1053  . apixaban (ELIQUIS) tablet 2.5 mg  2.5 mg Oral Q12H Demetrios Loll, MD   2.5 mg at 11/13/16 1439  . bisacodyl (DULCOLAX) EC tablet 5 mg  5 mg Oral Daily PRN Demetrios Loll, MD      . conjugated estrogens (PREMARIN) vaginal cream 1 Applicatorful  1 Applicatorful Vaginal QHS Demetrios Loll, MD   1 Applicatorful at 20/35/59 2134  . dextromethorphan (DELSYM) 30 MG/5ML liquid  30 mg  30 mg Oral Q12H PRN Demetrios Loll, MD      . dextrose 5 % and 0.45 % NaCl with KCl 10 mEq/L infusion   Intravenous Continuous Theodoro Grist, MD 100 mL/hr at 11/13/16 1350    . diphenhydrAMINE (BENADRYL) capsule 25 mg  25 mg Oral QHS PRN Hillary Bow, MD   25 mg at 11/10/16 2256  . feeding supplement (ENSURE ENLIVE) (ENSURE ENLIVE) liquid 237 mL  237 mL Oral TID BM Srikar Sudini, MD   237 mL at 11/13/16 1435  . levothyroxine (SYNTHROID, LEVOTHROID) tablet 100 mcg  100 mcg Oral Daily Demetrios Loll, MD   100 mcg at 11/13/16 0531  . loperamide (IMODIUM) capsule 4 mg  4 mg Oral PRN Demetrios Loll, MD      . meropenem (MERREM) IVPB SOLR 500 mg  500 mg Intravenous Q8H Theodoro Grist, MD   500 mg at 11/13/16 1350  . mirtazapine (REMERON) tablet 7.5 mg  7.5 mg Oral QHS Demetrios Loll, MD   7.5 mg at 11/12/16 2126  . ondansetron (ZOFRAN) tablet 4 mg  4 mg Oral Q6H PRN Demetrios Loll, MD       Or  . ondansetron [facility]) injection 4 mg  4 mg Intravenous Q6H PRN Demetrios Loll, MD      . oxybutynin (DITROPAN-XL) 24 hr tablet 5 mg  5 mg Oral QHS Demetrios Loll, MD   5 mg at 11/12/16 2125  . pantoprazole (PROTONIX) EC tablet 40 mg  40 mg Oral Daily Demetrios Loll, MD   40 mg at 11/13/16 1439  . potassium chloride (KLOR-CON) packet 40 mEq  40 mEq Oral Once Theodoro Grist, MD      . propranolol (INDERAL) tablet 30 mg  30 mg Oral BID Demetrios Loll, MD   30 mg at 11/12/16 2123  . senna-docusate (Senokot-S) tablet 1 tablet  1 tablet Oral QHS PRN Demetrios Loll, MD      . sodium chloride flush (NS) 0.9 % injection 10-40 mL  10-40 mL Intracatheter Q12H Theodoro Grist, MD   10 mL at 11/13/16 1355  . sodium chloride flush (NS) 0.9 % injection 10-40 mL  10-40 mL Intracatheter PRN Theodoro Grist, MD      . vitamin B-12 (CYANOCOBALAMIN) tablet 1,000 mcg  1,000 mcg Oral Daily Demetrios Loll, MD   1,000 mcg at 11/13/16 1440  . zolpidem (AMBIEN) tablet 5 mg  5 mg Oral QHS PRN Demetrios Loll, MD         Discharge Medications: Please see discharge summary for a list of  discharge medications.  Relevant Imaging Results:  Relevant Lab Results:   Additional Information Will need PICC line and IV ABX for ESBL in Urine.   (SSN: [id number])  Sample, Veronia Beets, LCSW

## 2016-11-14 DIAGNOSIS — Z7189 Other specified counseling: Secondary | ICD-10-CM

## 2016-11-14 DIAGNOSIS — Z515 Encounter for palliative care: Secondary | ICD-10-CM

## 2016-11-14 DIAGNOSIS — Z66 Do not resuscitate: Secondary | ICD-10-CM

## 2016-11-14 DIAGNOSIS — R531 Weakness: Secondary | ICD-10-CM

## 2016-11-14 DIAGNOSIS — E876 Hypokalemia: Secondary | ICD-10-CM

## 2016-11-14 DIAGNOSIS — G9340 Encephalopathy, unspecified: Secondary | ICD-10-CM

## 2016-11-14 MED ORDER — SODIUM CHLORIDE 0.9 % IV SOLN: 1.0000 g | INTRAVENOUS | 0 refills | Status: DC

## 2016-11-14 MED ORDER — SENNOSIDES-DOCUSATE SODIUM 8.6-50 MG PO TABS: 1.0000 | ORAL_TABLET | Freq: Every evening | ORAL | 3 refills | Status: DC | PRN

## 2016-11-14 MED ORDER — ENSURE ENLIVE PO LIQD: 237.0000 mL | Freq: Three times a day (TID) | ORAL | 12 refills | Status: DC

## 2016-11-14 MED ORDER — DIPHENHYDRAMINE HCL 25 MG PO CAPS: 25.0000 mg | ORAL_CAPSULE | Freq: Every evening | ORAL | 0 refills | Status: DC | PRN

## 2016-11-14 MED ORDER — ZOLPIDEM TARTRATE 5 MG PO TABS: 5.0000 mg | ORAL_TABLET | Freq: Every evening | ORAL | 0 refills | Status: DC | PRN

## 2016-11-14 MED ORDER — SODIUM CHLORIDE 0.9 % IV SOLN
1.0000 g | INTRAVENOUS | Status: DC
  Administered 2016-11-14: 1 g via INTRAVENOUS
  Filled 2016-11-14 (×2): qty 1

## 2016-11-14 NOTE — Care Management (Signed)
Spoke with Dr. Ether Griffins. Patient appears worse today Palliative has consulted. Attending would like to reevaluate patient tomorrow for any improvement.

## 2016-11-14 NOTE — Progress Notes (Signed)
Daily Progress Note   Patient Name: Evelyn [name]       Date: 11/14/2016 DOB: [birth year]  Age: 83 y.o. MRN#: [id number] Attending Physician: Theodoro Grist, MD Primary Care Physician: Crecencio Mc, MD Admit Date: 11/09/2016  Reason for Consultation/Follow-up: Establishing goals of care and specifically address code status in elderly female with declining health.    Subjective: [name] is more lethargic today and drifts in and out of alertness while I was in room. No complaints. Feeling "okay."   Length of Stay: 2  Current Medications: Scheduled Meds:  . acidophilus  1 capsule Oral Daily  . amLODipine  10 mg Oral Daily  . apixaban  2.5 mg Oral Q12H  . conjugated estrogens  1 Applicatorful Vaginal QHS  . feeding supplement (ENSURE ENLIVE)  237 mL Oral TID BM  . levothyroxine  100 mcg Oral Daily  . oxybutynin  5 mg Oral QHS  . pantoprazole  40 mg Oral Daily  . potassium chloride  40 mEq Oral Once  . propranolol  30 mg Oral BID  . sodium chloride flush  10-40 mL Intracatheter Q12H  . vitamin B-12  1,000 mcg Oral Daily    Continuous Infusions: . dextrose 5 % and 0.45 % NaCl with KCl 10 mEq/L 100 mL/hr at 11/14/16 0940  . ertapenem      PRN Meds: acetaminophen **OR** acetaminophen, albuterol, bisacodyl, loperamide, ondansetron **OR** ondansetron (ZOFRAN) IV, senna-docusate, sodium chloride flush  Physical Exam  Constitutional: She appears well-developed. She appears lethargic.  Thin, elderly, frail  HENT:  Head: Normocephalic and atraumatic.  Cardiovascular: Normal rate.   Pulmonary/Chest: Effort normal. No accessory muscle usage. No tachypnea. No respiratory distress.  Abdominal: Normal appearance.  Neurological: She appears lethargic. She is disoriented.    Nursing note and vitals reviewed.           Vital Signs: BP (!) 159/62 (BP Location: Left Arm)   Pulse 73   Temp 97.4 F (36.3 C) (Oral)   Resp 18   Ht 5' 6"  (1.676 m)   Wt 56.7 kg (125 lb)   SpO2 100%   BMI 20.18 kg/m  SpO2: SpO2: 100 % O2 Device: O2 Device: Not Delivered O2 Flow Rate: O2 Flow Rate (L/min): 2 L/min  Intake/output summary:  Intake/Output Summary (Last 24 hours) at 11/14/16 1523  Last data filed at 11/14/16 0300  Gross per 24 hour  Intake          1796.67 ml  Output                0 ml  Net          1796.67 ml   LBM: Last BM Date: 11/14/16 Baseline Weight: Weight: 56.7 kg (125 lb) Most recent weight: Weight: 56.7 kg (125 lb)       Palliative Assessment/Data:    Flowsheet Rows     Most Recent Value  Intake Tab  Referral Department  Hospitalist  Unit at Time of Referral  Orthopedic Unit  Palliative Care Primary Diagnosis  Neurology  Date Notified  11/09/16  Palliative Care Type  New Palliative care  Reason for referral  Clarify Goals of Care  Date of Admission  11/09/16  # of days IP prior to Palliative referral  0  Clinical Assessment  Psychosocial & Spiritual Assessment  Palliative Care Outcomes      Patient Active Problem List   Diagnosis Date Noted  . Acute encephalopathy 11/14/2016  . Hypokalemia 11/14/2016  . Generalized weakness 11/14/2016  . Goals of care, counseling/discussion   . DNR (do not resuscitate) discussion   . Palliative care encounter   . UTI (urinary tract infection) 11/09/2016  . Diaper dermatitis 08/16/2016  . Decubitus ulcer of sacral region, stage 1 08/14/2016  . Urinary incontinence 06/07/2016  . Encounter for Medicare annual wellness exam 05/28/2016  . Inflamed external hemorrhoid 05/28/2016  . Aortic stenosis, mild 05/14/2016  . Hospital discharge follow-up 05/14/2016  . Skin benign neoplasm 01/26/2016  . Anorexia 06/22/2015  . Chronic diastolic heart failure ([place]) 03/04/2015  . Paroxysmal atrial  fibrillation ([place]) 05/29/2014  . ESBL (extended spectrum beta-lactamase) producing bacteria infection 12/09/2013  . Unspecified vitamin D deficiency 11/25/2013  . Chronic insomnia 11/23/2013  . Bradycardia 04/10/2013  . Generalized muscle weakness 04/10/2013  . Anemia, iron deficiency 12/27/2012  . Anemia, pernicious 10/15/2011  . Spinal stenosis of lumbar region 10/15/2011  . Hypertension   . Hyperlipidemia   . Hypothyroidism   . Cystitis, chronic   . History of lymphoma     Palliative Care Assessment & Plan   HPI: 83 y.o. female  with past medical history of recurrent UTI, chronic cystitis, h/o lymphoma, anemia, CAD, GERD, HTN, HLD, hypothyroidism (thyroid removed), insomnia (took [place] for years and now on remeron) admitted on 11/09/2016 with confusion r/t UTI.    Assessment: I met today with son Evelyn [name], granddaughter and her husband at bedside. [name] is more lethargic today and has not really eaten anything but a magic cup. Spoke more with family who continues to be very concerned about her current situation. She is at baseline forgetful with likely a mild to moderate dementia but very talkative (family says it is difficult to get her to stop talking at times) so this is much different from her normal.   We discussed that even with mild dementia recurrent infections can take their toll on patients. She may have some hypoactive delirium or may just be slower to respond to treatment for her UTI. Difficult to say prognosis at this point. We did spend some time discussing how difficult this infection has been and consideration of hospice after treatment unless she makes a significant recovery. Discussed all possible scenarios. Family open to recommendations and focus on comfort if it comes to this. Will continue to follow and support.   Recommendations/Plan:  Continue treatment for UTI  Consider hospice use after treatment of UTI  D/C all sedating medications   Code  Status:  DNR  Prognosis:   Unable to determine  Discharge Planning:  To Be Determined   Thank you for allowing the Palliative Medicine Team to assist in the care of this patient.   Total Time 57mn Prolonged Time Billed  no       Greater than 50%  of this time was spent counseling and coordinating care related to the above assessment and plan.  AVinie Sill NP Palliative Medicine Team Pager # [phone](M-F 8a-5p) Team Phone # [phone](Nights/Weekends)

## 2016-11-14 NOTE — Progress Notes (Signed)
Clinical Education officer, museum (CSW) presented bed offers to patient's son [name] and granddaughter Maudie Mercury and they chose [facility]. Health Team authorization has been received, auth # [id number]. [facility] admissions coordinator at [facility] is aware of above. CSW will continue to follow and assist as needed.   [employer], LCSW [phone]

## 2016-11-14 NOTE — Progress Notes (Signed)
   Patient remains very somnolent, despite antibiotics, IV fluids, was evaluated by palliative care, family decided on DO NOT RESUSCITATE, she is not going to be discharged today due to very poor oral intake, somnolence despite current antibiotic use, supportive therapy. COPD medications, including Benadryl, Remeron, Ambien will be stopped, reassess patient in the morning

## 2016-11-15 ENCOUNTER — Telehealth: Payer: Self-pay | Admitting: Internal Medicine

## 2016-11-15 DIAGNOSIS — Z7189 Other specified counseling: Secondary | ICD-10-CM | POA: Diagnosis not present

## 2016-11-15 DIAGNOSIS — Z66 Do not resuscitate: Secondary | ICD-10-CM

## 2016-11-15 DIAGNOSIS — I10 Essential (primary) hypertension: Secondary | ICD-10-CM | POA: Diagnosis not present

## 2016-11-15 DIAGNOSIS — N390 Urinary tract infection, site not specified: Secondary | ICD-10-CM | POA: Diagnosis not present

## 2016-11-15 DIAGNOSIS — I480 Paroxysmal atrial fibrillation: Secondary | ICD-10-CM | POA: Diagnosis not present

## 2016-11-15 DIAGNOSIS — E039 Hypothyroidism, unspecified: Secondary | ICD-10-CM | POA: Diagnosis not present

## 2016-11-15 DIAGNOSIS — F325 Major depressive disorder, single episode, in full remission: Secondary | ICD-10-CM | POA: Diagnosis not present

## 2016-11-15 DIAGNOSIS — Z7901 Long term (current) use of anticoagulants: Secondary | ICD-10-CM | POA: Diagnosis not present

## 2016-11-15 DIAGNOSIS — I110 Hypertensive heart disease with heart failure: Secondary | ICD-10-CM | POA: Diagnosis not present

## 2016-11-15 DIAGNOSIS — F329 Major depressive disorder, single episode, unspecified: Secondary | ICD-10-CM | POA: Diagnosis not present

## 2016-11-15 DIAGNOSIS — A498 Other bacterial infections of unspecified site: Secondary | ICD-10-CM | POA: Diagnosis not present

## 2016-11-15 DIAGNOSIS — E46 Unspecified protein-calorie malnutrition: Secondary | ICD-10-CM | POA: Diagnosis not present

## 2016-11-15 DIAGNOSIS — N3000 Acute cystitis without hematuria: Secondary | ICD-10-CM | POA: Diagnosis not present

## 2016-11-15 DIAGNOSIS — Z7401 Bed confinement status: Secondary | ICD-10-CM | POA: Diagnosis not present

## 2016-11-15 DIAGNOSIS — I2510 Atherosclerotic heart disease of native coronary artery without angina pectoris: Secondary | ICD-10-CM | POA: Diagnosis not present

## 2016-11-15 DIAGNOSIS — G250 Essential tremor: Secondary | ICD-10-CM | POA: Diagnosis not present

## 2016-11-15 DIAGNOSIS — I4891 Unspecified atrial fibrillation: Secondary | ICD-10-CM | POA: Diagnosis not present

## 2016-11-15 DIAGNOSIS — Z452 Encounter for adjustment and management of vascular access device: Secondary | ICD-10-CM | POA: Diagnosis not present

## 2016-11-15 DIAGNOSIS — E441 Mild protein-calorie malnutrition: Secondary | ICD-10-CM | POA: Diagnosis not present

## 2016-11-15 DIAGNOSIS — Z1612 Extended spectrum beta lactamase (ESBL) resistance: Secondary | ICD-10-CM | POA: Diagnosis not present

## 2016-11-15 DIAGNOSIS — E785 Hyperlipidemia, unspecified: Secondary | ICD-10-CM | POA: Diagnosis not present

## 2016-11-15 DIAGNOSIS — G4700 Insomnia, unspecified: Secondary | ICD-10-CM | POA: Diagnosis not present

## 2016-11-15 DIAGNOSIS — Z515 Encounter for palliative care: Secondary | ICD-10-CM | POA: Diagnosis not present

## 2016-11-15 DIAGNOSIS — R531 Weakness: Secondary | ICD-10-CM | POA: Diagnosis not present

## 2016-11-15 DIAGNOSIS — G9340 Encephalopathy, unspecified: Secondary | ICD-10-CM | POA: Diagnosis not present

## 2016-11-15 DIAGNOSIS — K219 Gastro-esophageal reflux disease without esophagitis: Secondary | ICD-10-CM | POA: Diagnosis not present

## 2016-11-15 DIAGNOSIS — F0390 Unspecified dementia without behavioral disturbance: Secondary | ICD-10-CM | POA: Diagnosis not present

## 2016-11-15 NOTE — Discharge Summary (Signed)
[facility] [place] NAME: Evelyn [name]    MR#:  [id number]  DATE OF BIRTH:  [birth year]  DATE OF ADMISSION:  11/09/2016 ADMITTING PHYSICIAN: Demetrios Loll, MD  DATE OF DISCHARGE: 11/15/2016  PRIMARY CARE PHYSICIAN: Crecencio Mc, MD    ADMISSION DIAGNOSIS:  Disorientation [R41.0] Acute cystitis without hematuria [N30.00]  DISCHARGE DIAGNOSIS:  Active Problems:   ESBL (extended spectrum beta-lactamase) producing bacteria infection   UTI (urinary tract infection)   Goals of care, counseling/discussion   DNR (do not resuscitate) discussion   Palliative care encounter   Acute encephalopathy   Hypokalemia   Generalized weakness   DNR (do not resuscitate)   SECONDARY DIAGNOSIS:   Past Medical History:  Diagnosis Date  . Anemia    hx of blood transfusion  . Cancer ([place])    lymphomia  . Coarse tremors    head shakes if does not take propranolol  . Coronary artery disease   . GERD (gastroesophageal reflux disease)   . History of lymphoma   . Hyperlipidemia   . Hypertension   . Hypothyroidism   . Insomnia    takes remeron nightly  . Neuromuscular disorder ([place])   . Other chronic cystitis    Radiation , botox injections  . Self-catheterizes urinary bladder    twice daily  . Thyroid disease    had thyroid removed    HOSPITAL COURSE:   83 year old female with recurrent urinary tract infections, atrial fibrillation and CAD who presented with altered mental status.  1. Acute encephalopathy in the setting of urinary tract infection and cognitive deficit at baseline Patient now back to her baseline  2. ESBL urinary tract infection: Patient has PICC line and she will continue ertapenem for 6 more days. Stop date will be November 21, 2016.  3. PAF: Patient will continue on propanolol and Eliquis  4. Protein calorie malnutrition: Patient will continue on nutritional supplementations Patient has poor by mouth intake. Patient  encouraged to eat and family encouraged to try to assist with feedings. Patient will benefit from positive care consult. 5. Essential hypertension: Continue Norvasc  6. Hypothyroid: Continue Synthroid  7. Depression: Continue Remeron  DISCHARGE CONDITIONS AND DIET:   Stable Dysphasia to diet with nectar thick. Patient would benefit from palliative care consult and speech consult at skilled nursing facility.  CONSULTS OBTAINED:    DRUG ALLERGIES:   Allergies  Allergen Reactions  . Macrolides And Ketolides Other (See Comments)  . Oxycodone Nausea Only  . Ciprofloxacin Swelling, Rash and Other (See Comments)  . Neomy-Bacit-Polymyx-Pramoxine Rash    "All antibiotics"  . Other Rash    "All antibiotics"  . Sulfa Antibiotics Swelling, Rash and Other (See Comments)    DISCHARGE MEDICATIONS:   Current Discharge Medication List    START taking these medications   Details  ertapenem 1 g in sodium chloride 0.9 % 50 mL Inject 1 g into the vein daily. Qty: 6 Dose, Refills: 0    feeding supplement, ENSURE ENLIVE, (ENSURE ENLIVE) LIQD Take 237 mLs by mouth 3 (three) times daily between meals. Qty: 90 Bottle, Refills: 12    senna-docusate (SENOKOT-S) 8.6-50 MG tablet Take 1 tablet by mouth at bedtime as needed for mild constipation. Qty: 30 tablet, Refills: 3      CONTINUE these medications which have CHANGED   Details  diphenhydrAMINE (BENADRYL) 25 mg capsule Take 1 capsule (25 mg total) by mouth at bedtime as needed for sleep. Qty: 30 capsule,  Refills: 0    zolpidem (AMBIEN) 5 MG tablet Take 1 tablet (5 mg total) by mouth at bedtime as needed for sleep. Qty: 30 tablet, Refills: 0      CONTINUE these medications which have NOT CHANGED   Details  acetaminophen (TYLENOL) 325 MG tablet Take 650 mg by mouth every 8 (eight) hours as needed for mild pain.     acidophilus (RISAQUAD) CAPS capsule Take 1 capsule by mouth daily.    amLODipine (NORVASC) 10 MG tablet Take 1 tablet  (10 mg total) by mouth daily. Qty: 30 tablet, Refills: 0    apixaban (ELIQUIS) 2.5 MG TABS tablet Take 2.5 mg by mouth every 12 (twelve) hours.     conjugated estrogens (PREMARIN) vaginal cream Place 1 Applicatorful vaginally every Monday, Wednesday, and Friday. Qty: 42.5 g, Refills: 3    Cranberry 250 MG TABS Take 1 tablet (250 mg total) by mouth 2 (two) times daily. Qty: 60 tablet, Refills: 12    dextromethorphan (DELSYM) 30 MG/5ML liquid Take 30 mg by mouth every 12 (twelve) hours as needed for cough.    dibucaine (NUPERCAINAL) 1 % ointment Apply topically 3 (three) times daily as needed for pain. From henmorrhoids Qty: 30 g, Refills: 4    levothyroxine (SYNTHROID, LEVOTHROID) 100 MCG tablet Take 100 mcg by mouth daily.    loperamide (IMODIUM A-D) 2 MG tablet Take 4 mg by mouth as needed for diarrhea or loose stools.     mirtazapine (REMERON) 7.5 MG tablet Take 1 tablet (7.5 mg total) by mouth at bedtime. Qty: 90 tablet, Refills: 1    Multiple Vitamins-Minerals (MULTIVITAMIN WITH MINERALS) tablet Take 1 tablet by mouth daily.   Associated Diagnoses: History of lymphoma    omeprazole (PRILOSEC) 20 MG capsule Take 20 mg by mouth daily.    propranolol (INDERAL) 20 MG tablet Take 1.5 tablets (30 mg total) by mouth 2 (two) times daily. Qty: 90 tablet, Refills: 3    shark liver oil-cocoa butter (PREPARATION H) 0.25-3-85.5 % suppository Place 1 suppository rectally daily as needed for hemorrhoids.    tolterodine (DETROL) 2 MG tablet Take 4 mg by mouth daily.    triamcinolone cream (KENALOG) 0.1 % Apply 1 application topically 2 (two) times daily. To irritated area Qty: 45 g, Refills: 0    vitamin B-12 (CYANOCOBALAMIN) 1000 MCG tablet Take 1,000 mcg by mouth daily.      STOP taking these medications     cephALEXin (KEFLEX) 250 MG capsule           Today   CHIEF COMPLAINT:   Patient is awake this morning. Family bedside Concerned about decreased appetite   VITAL  SIGNS:  Blood pressure 108/66, pulse (!) 124, temperature 97.9 F (36.6 C), temperature source Oral, resp. rate 18, height 5\' 6"  (1.676 m), weight 56.7 kg (125 lb), SpO2 96 %.   REVIEW OF SYSTEMS:  Review of Systems  Constitutional: Negative.  Negative for chills, fever and malaise/fatigue.  HENT: Negative.  Negative for ear discharge, ear pain, hearing loss, nosebleeds and sore throat.   Eyes: Negative.  Negative for blurred vision and pain.  Respiratory: Negative.  Negative for cough, hemoptysis, shortness of breath and wheezing.   Cardiovascular: Negative.  Negative for chest pain, palpitations and leg swelling.  Gastrointestinal: Negative.  Negative for abdominal pain, blood in stool, diarrhea, nausea and vomiting.  Genitourinary: Negative.  Negative for dysuria.  Musculoskeletal: Negative.  Negative for back pain.  Skin: Negative.   Neurological: Negative for dizziness,  tremors, speech change, focal weakness, seizures and headaches.  Endo/Heme/Allergies: Negative.  Does not bruise/bleed easily.  Psychiatric/Behavioral: Positive for memory loss. Negative for depression, hallucinations and suicidal ideas.     PHYSICAL EXAMINATION:  GENERAL:  83 y.o.-year-old patient lying in the bed with no acute distress.  NECK:  Supple, no jugular venous distention. No thyroid enlargement, no tenderness.  LUNGS: Normal breath sounds bilaterally, no wheezing, rales,rhonchi  No use of accessory muscles of respiration.  CARDIOVASCULAR: S1, S2 normal. No murmurs, rubs, or gallops.  ABDOMEN: Soft, non-tender, non-distended. Bowel sounds present. No organomegaly or mass.  EXTREMITIES: No pedal edema, cyanosis, or clubbing.  PSYCHIATRIC: The patient is alert and oriented x Name and place not time.  SKIN: No obvious rash, lesion, or ulcer.   DATA REVIEW:   CBC  Recent Labs Lab 11/13/16 0326  WBC 9.6  HGB 10.9*  HCT 32.5*  PLT 459*    Chemistries   Recent Labs Lab 11/09/16 0921  11/10/16 0412 11/11/16 0803 11/12/16 0405 11/13/16 0326  NA 139 140  --   --   --   K 3.4* 3.4*  --  4.2  --   CL 104 108  --   --   --   CO2 28 27  --   --   --   GLUCOSE 115* 96  --   --   --   BUN 12 12  --   --   --   CREATININE 0.89 0.89  --   --  0.88  CALCIUM 8.9 8.4*  --   --   --   MG  --   --  1.8  --   --   AST 18  --   --   --   --   ALT 12*  --   --   --   --   ALKPHOS 78  --   --   --   --   BILITOT 0.2*  --   --   --   --     Cardiac Enzymes  Recent Labs Lab 11/09/16 0921  TROPONINI <0.03    Microbiology Results  @MICRORSLT48 @  RADIOLOGY:  No results found.    Current Discharge Medication List    START taking these medications   Details  ertapenem 1 g in sodium chloride 0.9 % 50 mL Inject 1 g into the vein daily. Qty: 6 Dose, Refills: 0    feeding supplement, ENSURE ENLIVE, (ENSURE ENLIVE) LIQD Take 237 mLs by mouth 3 (three) times daily between meals. Qty: 90 Bottle, Refills: 12    senna-docusate (SENOKOT-S) 8.6-50 MG tablet Take 1 tablet by mouth at bedtime as needed for mild constipation. Qty: 30 tablet, Refills: 3      CONTINUE these medications which have CHANGED   Details  diphenhydrAMINE (BENADRYL) 25 mg capsule Take 1 capsule (25 mg total) by mouth at bedtime as needed for sleep. Qty: 30 capsule, Refills: 0    zolpidem (AMBIEN) 5 MG tablet Take 1 tablet (5 mg total) by mouth at bedtime as needed for sleep. Qty: 30 tablet, Refills: 0      CONTINUE these medications which have NOT CHANGED   Details  acetaminophen (TYLENOL) 325 MG tablet Take 650 mg by mouth every 8 (eight) hours as needed for mild pain.     acidophilus (RISAQUAD) CAPS capsule Take 1 capsule by mouth daily.    amLODipine (NORVASC) 10 MG tablet Take 1 tablet (10 mg total) by mouth  daily. Qty: 30 tablet, Refills: 0    apixaban (ELIQUIS) 2.5 MG TABS tablet Take 2.5 mg by mouth every 12 (twelve) hours.     conjugated estrogens (PREMARIN) vaginal cream Place 1  Applicatorful vaginally every Monday, Wednesday, and Friday. Qty: 42.5 g, Refills: 3    Cranberry 250 MG TABS Take 1 tablet (250 mg total) by mouth 2 (two) times daily. Qty: 60 tablet, Refills: 12    dextromethorphan (DELSYM) 30 MG/5ML liquid Take 30 mg by mouth every 12 (twelve) hours as needed for cough.    dibucaine (NUPERCAINAL) 1 % ointment Apply topically 3 (three) times daily as needed for pain. From henmorrhoids Qty: 30 g, Refills: 4    levothyroxine (SYNTHROID, LEVOTHROID) 100 MCG tablet Take 100 mcg by mouth daily.    loperamide (IMODIUM A-D) 2 MG tablet Take 4 mg by mouth as needed for diarrhea or loose stools.     mirtazapine (REMERON) 7.5 MG tablet Take 1 tablet (7.5 mg total) by mouth at bedtime. Qty: 90 tablet, Refills: 1    Multiple Vitamins-Minerals (MULTIVITAMIN WITH MINERALS) tablet Take 1 tablet by mouth daily.   Associated Diagnoses: History of lymphoma    omeprazole (PRILOSEC) 20 MG capsule Take 20 mg by mouth daily.    propranolol (INDERAL) 20 MG tablet Take 1.5 tablets (30 mg total) by mouth 2 (two) times daily. Qty: 90 tablet, Refills: 3    shark liver oil-cocoa butter (PREPARATION H) 0.25-3-85.5 % suppository Place 1 suppository rectally daily as needed for hemorrhoids.    tolterodine (DETROL) 2 MG tablet Take 4 mg by mouth daily.    triamcinolone cream (KENALOG) 0.1 % Apply 1 application topically 2 (two) times daily. To irritated area Qty: 45 g, Refills: 0    vitamin B-12 (CYANOCOBALAMIN) 1000 MCG tablet Take 1,000 mcg by mouth daily.      STOP taking these medications     cephALEXin (KEFLEX) 250 MG capsule            Management plans discussed with the patient and family and they are in agreement. Stable for discharge SNF  Patient should follow up with pcp  CODE STATUS:     Code Status Orders        Start     Ordered   11/14/16 1517  Do not attempt resuscitation (DNR)  Continuous    Question Answer Comment  In the event of  cardiac or respiratory ARREST Do not call a "code blue"   In the event of cardiac or respiratory ARREST Do not perform Intubation, CPR, defibrillation or ACLS   In the event of cardiac or respiratory ARREST Use medication by any route, position, wound care, and other measures to relive pain and suffering. May use oxygen, suction and manual treatment of airway obstruction as needed for comfort.      11/14/16 1516    Code Status History    Date Active Date Inactive Code Status Order ID Comments User Context   11/09/2016  1:00 PM 11/14/2016  3:16 PM Full Code [id number]  Demetrios Loll, MD Inpatient   10/03/2016 11:40 PM 10/06/2016  6:25 PM Full Code [id number]  Harvie Bridge, DO Inpatient   05/06/2016  1:01 AM 05/09/2016  6:43 PM Full Code [id number]  Lance Coon, MD Inpatient   05/05/2015  8:48 PM 05/10/2015  5:35 PM Full Code [id number]  Henreitta Leber, MD Inpatient      TOTAL TIME TAKING CARE OF THIS PATIENT: 38 minutes.    Note: This dictation  was prepared with Dragon dictation along with smaller phrase technology. Any transcriptional errors that result from this process are unintentional.  Duey Liller M.D on 11/15/2016 at 9:48 AM  Between 7am to 6pm - Pager - [phone] After 6pm go to [email or web address] - password [email or web address] [facility]  Office  [phone]  CC: Primary care physician; Crecencio Mc, MD

## 2016-11-15 NOTE — Telephone Encounter (Signed)
Evelyn [name] [phone] called from [facility] regarding pt being discharged today. Dx was Acute Cystitis. Pt needs to be seen in 1 week. No appt avail to sch. Thank you!

## 2016-11-15 NOTE — Progress Notes (Signed)
Patient is alert to self, son and grandson at bedside. PICC line in place on RUA and flushes without resistance. Vital signs stable. Patient cleaned and ready for transport. Report called in to Sam at [facility]. EMS called for transport.

## 2016-11-15 NOTE — Progress Notes (Signed)
Patient is medically stable for D/C to [facility] today for 6 days of IV Abx. Patient has a PICC line. Per [facility] admissions coordinator at [facility] patient can come today to room 505. RN will call report and arrange EMS for transport. Health Team authorization has been received, auth # [id number]. Clinical Education officer, museum (CSW) sent D/C orders to [facility] via [place]. Patient's son [name] is at bedside and aware of D/C today. Please reconsult if future social work needs arise. CSW signing off.   [employer], LCSW [phone]

## 2016-11-15 NOTE — Progress Notes (Signed)
New referral for Palliative to follow at [facility] received from [place]. Referral made aware. Thank you. Flo Shanks RN, BSN, [place] [facility] [place], [facility] [place] [phone] c

## 2016-11-15 NOTE — Clinical Social Work Placement (Signed)
   CLINICAL SOCIAL WORK PLACEMENT  NOTE  Date:  11/15/2016  Patient Details  Name: Evelyn [name] MRN: [id number] Date of Birth: Sep 10, 1919  Clinical Social Work is seeking post-discharge placement for this patient at the [place] level of care (*CSW will initial, date and re-position this form in  chart as items are completed):  Yes   Patient/family provided with [facility] Work Department's list of facilities offering this level of care within the geographic area requested by the patient (or if unable, by the patient's family).  Yes   Patient/family informed of their freedom to choose among providers that offer the needed level of care, that participate in Medicare, Medicaid or managed care program needed by the patient, have an available bed and are willing to accept the patient.  Yes   Patient/family informed of [facility]'s ownership interest in [facility] and [facility], as well as of the fact that they are under no obligation to receive care at these facilities.  PASRR submitted to EDS on       PASRR number received on       Existing PASRR number confirmed on 11/13/16     FL2 transmitted to all facilities in geographic area requested by pt/family on 11/13/16     FL2 transmitted to all facilities within larger geographic area on       Patient informed that his/her managed care company has contracts with or will negotiate with certain facilities, including the following:        Yes   Patient/family informed of bed offers received.  Patient chooses bed at  [facility] )     Physician recommends and patient chooses bed at      Patient to be transferred to  [facility] ) on 11/15/16.  Patient to be transferred to facility by  [facility] )     Patient family notified on 11/15/16 of transfer.  Name of family member notified:   (Patient's son Evelyn [name] is at bedside and aware of D/C today. )     PHYSICIAN        Additional Comment:    _______________________________________________ Shaddai Shapley, Veronia Beets, LCSW 11/15/2016, 11:58 AM

## 2016-11-15 NOTE — Progress Notes (Signed)
Daily Progress Note   Patient Name: Evelyn [name]       Date: 11/15/2016 DOB: 01/19/1920  Age: 83 y.o. MRN#: [id number] Attending Physician: Bettey Costa, MD Primary Care Physician: Crecencio Mc, MD Admit Date: 11/09/2016  Reason for Consultation/Follow-up: Establishing goals of care and specifically address code status in elderly female with declining health.    Subjective: Evelyn [name] is more lethargic today and very lethargic/somnolent. Fluctuating mental status but far from her baseline.   Length of Stay: 3  Current Medications: Scheduled Meds:  . acidophilus  1 capsule Oral Daily  . amLODipine  10 mg Oral Daily  . apixaban  2.5 mg Oral Q12H  . conjugated estrogens  1 Applicatorful Vaginal QHS  . feeding supplement (ENSURE ENLIVE)  237 mL Oral TID BM  . levothyroxine  100 mcg Oral Daily  . oxybutynin  5 mg Oral QHS  . pantoprazole  40 mg Oral Daily  . potassium chloride  40 mEq Oral Once  . propranolol  30 mg Oral BID  . sodium chloride flush  10-40 mL Intracatheter Q12H  . vitamin B-12  1,000 mcg Oral Daily    Continuous Infusions: . dextrose 5 % and 0.45 % NaCl with KCl 10 mEq/L 100 mL/hr at 11/14/16 2229  . ertapenem Stopped (11/14/16 1745)    PRN Meds: acetaminophen **OR** acetaminophen, albuterol, bisacodyl, loperamide, ondansetron **OR** ondansetron (ZOFRAN) IV, senna-docusate, sodium chloride flush  Physical Exam  Constitutional: She appears well-developed. She appears lethargic.  Thin, elderly, frail  HENT:  Head: Normocephalic and atraumatic.  Cardiovascular: Normal rate.   Pulmonary/Chest: Effort normal. No accessory muscle usage. No tachypnea. No respiratory distress.  Abdominal: Normal appearance.  Neurological: She appears lethargic. She is  disoriented.  Nursing note and vitals reviewed.           Vital Signs: BP 108/66 (BP Location: Right Arm)   Pulse (!) 124   Temp 97.9 F (36.6 C) (Oral)   Resp 18   Ht 5' 6"  (1.676 m)   Wt 56.7 kg (125 lb)   SpO2 96%   BMI 20.18 kg/m  SpO2: SpO2: 96 % O2 Device: O2 Device: Not Delivered O2 Flow Rate: O2 Flow Rate (L/min): 2 L/min  Intake/output summary:   Intake/Output Summary (Last 24 hours) at 11/15/16 1151 Last data filed at  11/14/16 1715  Gross per 24 hour  Intake               50 ml  Output                0 ml  Net               50 ml   LBM: Last BM Date: 11/15/16 Baseline Weight: Weight: 56.7 kg (125 lb) Most recent weight: Weight: 56.7 kg (125 lb)       Palliative Assessment/Data:    Flowsheet Rows     Most Recent Value  Intake Tab  Referral Department  Hospitalist  Unit at Time of Referral  Orthopedic Unit  Palliative Care Primary Diagnosis  Neurology  Date Notified  11/09/16  Palliative Care Type  New Palliative care  Reason for referral  Clarify Goals of Care  Date of Admission  11/09/16  # of days IP prior to Palliative referral  0  Clinical Assessment  Psychosocial & Spiritual Assessment  Palliative Care Outcomes      Patient Active Problem List   Diagnosis Date Noted  . DNR (do not resuscitate)   . Acute encephalopathy 11/14/2016  . Hypokalemia 11/14/2016  . Generalized weakness 11/14/2016  . Goals of care, counseling/discussion   . DNR (do not resuscitate) discussion   . Palliative care encounter   . UTI (urinary tract infection) 11/09/2016  . Diaper dermatitis 08/16/2016  . Decubitus ulcer of sacral region, stage 1 08/14/2016  . Urinary incontinence 06/07/2016  . Encounter for Medicare annual wellness exam 05/28/2016  . Inflamed external hemorrhoid 05/28/2016  . Aortic stenosis, mild 05/14/2016  . Hospital discharge follow-up 05/14/2016  . Skin benign neoplasm 01/26/2016  . Anorexia 06/22/2015  . Chronic diastolic heart failure  ([place]) 03/04/2015  . Paroxysmal atrial fibrillation ([place]) 05/29/2014  . ESBL (extended spectrum beta-lactamase) producing bacteria infection 12/09/2013  . Unspecified vitamin D deficiency 11/25/2013  . Chronic insomnia 11/23/2013  . Bradycardia 04/10/2013  . Generalized muscle weakness 04/10/2013  . Anemia, iron deficiency 12/27/2012  . Anemia, pernicious 10/15/2011  . Spinal stenosis of lumbar region 10/15/2011  . Hypertension   . Hyperlipidemia   . Hypothyroidism   . Cystitis, chronic   . History of lymphoma     Palliative Care Assessment & Plan   HPI: 83 y.o. female  with past medical history of recurrent UTI, chronic cystitis, h/o lymphoma, anemia, CAD, GERD, HTN, HLD, hypothyroidism (thyroid removed), insomnia (took [place] for years and now on remeron) admitted on 11/09/2016 with confusion r/t UTI.    Assessment: I met again today with Evelyn [name] and grandchildren at bedside. Evelyn [name] is even more lethargic today but ate just a couple bites of breakfast and her magic cup. Will not even take any water now. Discussed impending discharge to SNF [facility]. Discussed if she continues at this status or continues to decline she will not be able to return to her ALF most likely but may be able to go to hospice facility. Poor intake, poor functional status, and lessening moments of alertness.   I will try and call her other 2 sons to explain her declining status. They will need outpatient palliative services to follow and guide on next steps after she completes her antibiotics 11/21/16 (I anticipate may need hospice facility at this point).   Recommendations/Plan:  Continue treatment for UTI  Consider hospice use after treatment of UTI  D/C all sedating medications   Code Status:  DNR  Prognosis:   Unable to determine  Discharge Planning:  To Be Determined   Thank you for allowing the Palliative Medicine Team to assist in the care of this patient.   Total Time  1mn Prolonged Time Billed  no       Greater than 50%  of this time was spent counseling and coordinating care related to the above assessment and plan.  AVinie Sill NP Palliative Medicine Team Pager # [phone](M-F 8a-5p) Team Phone # [phone](Nights/Weekends)

## 2016-11-16 DIAGNOSIS — A498 Other bacterial infections of unspecified site: Secondary | ICD-10-CM | POA: Diagnosis not present

## 2016-11-16 DIAGNOSIS — F325 Major depressive disorder, single episode, in full remission: Secondary | ICD-10-CM | POA: Diagnosis not present

## 2016-11-16 DIAGNOSIS — I480 Paroxysmal atrial fibrillation: Secondary | ICD-10-CM | POA: Diagnosis not present

## 2016-11-16 DIAGNOSIS — Z1612 Extended spectrum beta lactamase (ESBL) resistance: Secondary | ICD-10-CM | POA: Diagnosis not present

## 2016-11-16 DIAGNOSIS — E441 Mild protein-calorie malnutrition: Secondary | ICD-10-CM | POA: Diagnosis not present

## 2016-11-16 NOTE — Telephone Encounter (Signed)
First attempt at TCM called patient son , due to patient lives in assisted living no access to personal phone.

## 2016-11-17 NOTE — Telephone Encounter (Signed)
Called and spoke with [name] patient has been moved to [facility] and at this time she is unresponsive and Hospice care is being consulted. FYI

## 2016-11-20 ENCOUNTER — Ambulatory Visit: Payer: PPO | Admitting: Internal Medicine

## 2016-11-26 DIAGNOSIS — M25531 Pain in right wrist: Secondary | ICD-10-CM | POA: Diagnosis not present

## 2016-11-26 DIAGNOSIS — M79641 Pain in right hand: Secondary | ICD-10-CM | POA: Diagnosis not present

## 2016-11-28 ENCOUNTER — Ambulatory Visit: Payer: PPO | Admitting: Internal Medicine

## 2016-11-28 DIAGNOSIS — E785 Hyperlipidemia, unspecified: Secondary | ICD-10-CM | POA: Diagnosis not present

## 2016-11-28 DIAGNOSIS — K219 Gastro-esophageal reflux disease without esophagitis: Secondary | ICD-10-CM | POA: Diagnosis not present

## 2016-11-28 DIAGNOSIS — I2510 Atherosclerotic heart disease of native coronary artery without angina pectoris: Secondary | ICD-10-CM | POA: Diagnosis not present

## 2016-11-28 DIAGNOSIS — I110 Hypertensive heart disease with heart failure: Secondary | ICD-10-CM | POA: Diagnosis not present

## 2016-11-28 DIAGNOSIS — G4700 Insomnia, unspecified: Secondary | ICD-10-CM | POA: Diagnosis not present

## 2016-11-28 DIAGNOSIS — F329 Major depressive disorder, single episode, unspecified: Secondary | ICD-10-CM | POA: Diagnosis not present

## 2016-11-28 DIAGNOSIS — Z1612 Extended spectrum beta lactamase (ESBL) resistance: Secondary | ICD-10-CM | POA: Diagnosis not present

## 2016-11-28 DIAGNOSIS — N390 Urinary tract infection, site not specified: Secondary | ICD-10-CM | POA: Diagnosis not present

## 2016-11-28 DIAGNOSIS — Z452 Encounter for adjustment and management of vascular access device: Secondary | ICD-10-CM | POA: Diagnosis not present

## 2016-11-28 DIAGNOSIS — G250 Essential tremor: Secondary | ICD-10-CM | POA: Diagnosis not present

## 2016-11-28 DIAGNOSIS — E46 Unspecified protein-calorie malnutrition: Secondary | ICD-10-CM | POA: Diagnosis not present

## 2016-11-28 DIAGNOSIS — I4891 Unspecified atrial fibrillation: Secondary | ICD-10-CM | POA: Diagnosis not present

## 2016-11-28 DIAGNOSIS — E039 Hypothyroidism, unspecified: Secondary | ICD-10-CM | POA: Diagnosis not present

## 2016-11-28 DIAGNOSIS — Z7901 Long term (current) use of anticoagulants: Secondary | ICD-10-CM | POA: Diagnosis not present

## 2016-11-28 DIAGNOSIS — F0390 Unspecified dementia without behavioral disturbance: Secondary | ICD-10-CM | POA: Diagnosis not present

## 2016-12-01 DIAGNOSIS — M25551 Pain in right hip: Secondary | ICD-10-CM | POA: Diagnosis not present

## 2016-12-11 ENCOUNTER — Telehealth: Payer: Self-pay | Admitting: Internal Medicine

## 2016-12-11 NOTE — Telephone Encounter (Signed)
Deborah [phone] called from [facility] regarding wanting to know what type lymphoma? Please advise? Please fax if any information is given to [phone]. Thank you!

## 2016-12-12 NOTE — Telephone Encounter (Signed)
Spoke with Langley Gauss at [facility] and advised of below she will give [employer].

## 2016-12-12 NOTE — Telephone Encounter (Signed)
Please advise 

## 2016-12-12 NOTE — Telephone Encounter (Signed)
IRRELEVANT. PATINE IS 89,  AND LYMPHOMA WAS CURED OVER 5 Yrs ago , no records available

## 2016-12-15 IMAGING — CR DG CHEST 1V
1 series · 1 of 1 positions shown · non-contrast
Comparison: April 15, 2014

CLINICAL DATA: Altered mental status

EXAM:
CHEST 1 VIEW

[ap]
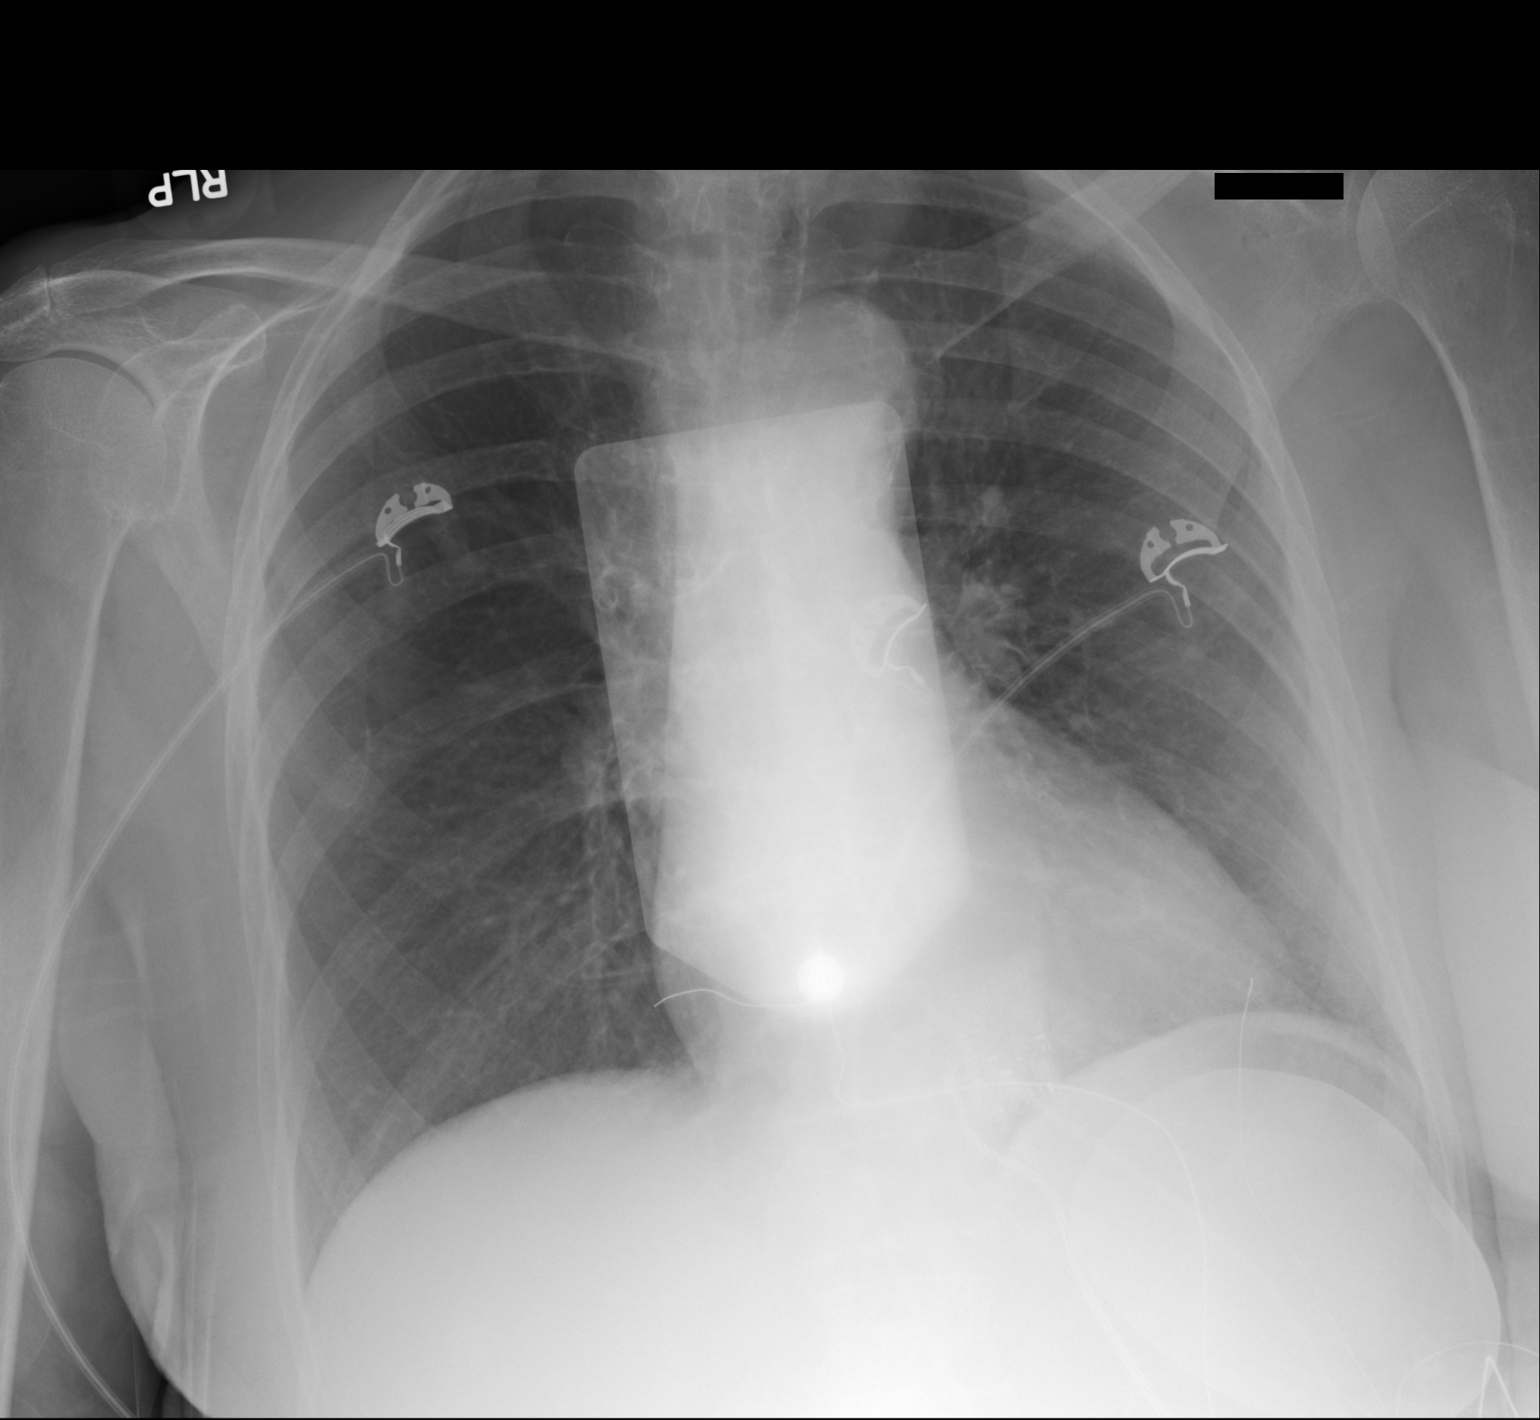

[1 of 1 positions shown; findings below may reference images not displayed]

FINDINGS: There is no edema or consolidation. Heart size and pulmonary
vascularity are normal. No adenopathy. No bone lesions.
IMPRESSION: No edema or consolidation.

## 2016-12-15 IMAGING — CR DG PELVIS 1-2V
1 series · 1 of 1 positions shown · non-contrast
Comparison: 12/04/2013

CLINICAL DATA: Suspected unwitnessed fall, pelvic pain, history of
prior ORIF for a right hip fracture.

EXAM:
PELVIS - 1-2 VIEW

[ap]
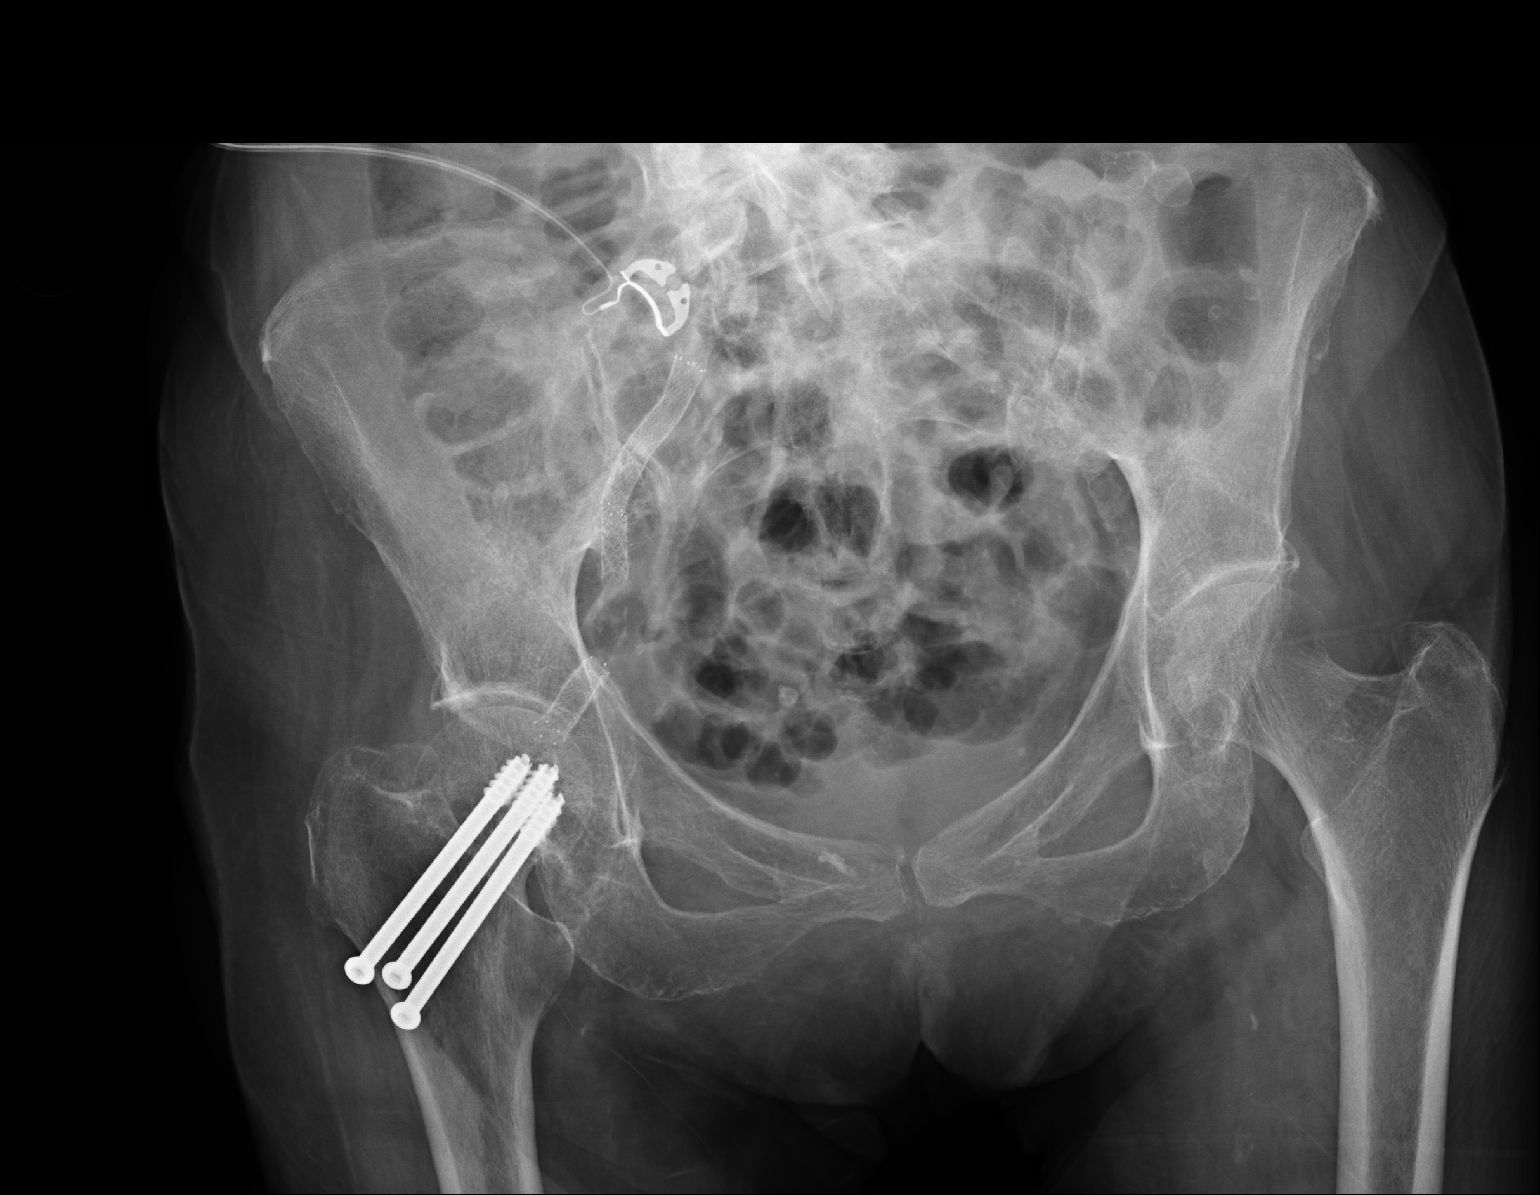

[1 of 1 positions shown; findings below may reference images not displayed]

FINDINGS: Previous fixation screws of the right hip for a remote fracture.
Right iliac vascular stents noted. Bones are osteopenic. Pelvis and
hips appear intact. No displaced fracture or diastasis. Degenerative
changes of the spine and SI joints. Normal bowel gas pattern.
IMPRESSION: Stable postoperative findings.

Osteopenia

No acute osseous finding by plain radiography

## 2016-12-31 ENCOUNTER — Emergency Department: Payer: PPO

## 2016-12-31 ENCOUNTER — Encounter: Payer: Self-pay | Admitting: Intensive Care

## 2016-12-31 ENCOUNTER — Inpatient Hospital Stay
Admission: EM | Admit: 2016-12-31 | Discharge: 2017-01-04 | DRG: 871 | Disposition: A | Discharge status: Skilled Nursing Facility | Payer: PPO | Attending: Internal Medicine | Admitting: Internal Medicine

## 2016-12-31 DIAGNOSIS — R7881 Bacteremia: Secondary | ICD-10-CM | POA: Diagnosis not present

## 2016-12-31 DIAGNOSIS — Z87440 Personal history of urinary (tract) infections: Secondary | ICD-10-CM | POA: Diagnosis not present

## 2016-12-31 DIAGNOSIS — S32511A Fracture of superior rim of right pubis, initial encounter for closed fracture: Secondary | ICD-10-CM

## 2016-12-31 DIAGNOSIS — R7989 Other specified abnormal findings of blood chemistry: Secondary | ICD-10-CM

## 2016-12-31 DIAGNOSIS — N390 Urinary tract infection, site not specified: Secondary | ICD-10-CM | POA: Diagnosis present

## 2016-12-31 DIAGNOSIS — Z515 Encounter for palliative care: Secondary | ICD-10-CM | POA: Diagnosis not present

## 2016-12-31 DIAGNOSIS — Z66 Do not resuscitate: Secondary | ICD-10-CM | POA: Diagnosis present

## 2016-12-31 DIAGNOSIS — S72001A Fracture of unspecified part of neck of right femur, initial encounter for closed fracture: Secondary | ICD-10-CM | POA: Diagnosis not present

## 2016-12-31 DIAGNOSIS — W1839XA Other fall on same level, initial encounter: Secondary | ICD-10-CM | POA: Diagnosis present

## 2016-12-31 DIAGNOSIS — J690 Pneumonitis due to inhalation of food and vomit: Secondary | ICD-10-CM | POA: Diagnosis not present

## 2016-12-31 DIAGNOSIS — I10 Essential (primary) hypertension: Secondary | ICD-10-CM | POA: Diagnosis not present

## 2016-12-31 DIAGNOSIS — J189 Pneumonia, unspecified organism: Secondary | ICD-10-CM | POA: Diagnosis not present

## 2016-12-31 DIAGNOSIS — I2510 Atherosclerotic heart disease of native coronary artery without angina pectoris: Secondary | ICD-10-CM | POA: Diagnosis present

## 2016-12-31 DIAGNOSIS — R464 Slowness and poor responsiveness: Secondary | ICD-10-CM | POA: Diagnosis not present

## 2016-12-31 DIAGNOSIS — I248 Other forms of acute ischemic heart disease: Secondary | ICD-10-CM | POA: Diagnosis present

## 2016-12-31 DIAGNOSIS — R778 Other specified abnormalities of plasma proteins: Secondary | ICD-10-CM

## 2016-12-31 DIAGNOSIS — S329XXA Fracture of unspecified parts of lumbosacral spine and pelvis, initial encounter for closed fracture: Secondary | ICD-10-CM | POA: Diagnosis not present

## 2016-12-31 DIAGNOSIS — A419 Sepsis, unspecified organism: Secondary | ICD-10-CM | POA: Diagnosis present

## 2016-12-31 DIAGNOSIS — L8990 Pressure ulcer of unspecified site, unspecified stage: Secondary | ICD-10-CM | POA: Diagnosis not present

## 2016-12-31 DIAGNOSIS — W06XXXA Fall from bed, initial encounter: Secondary | ICD-10-CM | POA: Diagnosis not present

## 2016-12-31 DIAGNOSIS — W19XXXA Unspecified fall, initial encounter: Secondary | ICD-10-CM

## 2016-12-31 DIAGNOSIS — Z7401 Bed confinement status: Secondary | ICD-10-CM | POA: Diagnosis not present

## 2016-12-31 DIAGNOSIS — F0390 Unspecified dementia without behavioral disturbance: Secondary | ICD-10-CM | POA: Diagnosis present

## 2016-12-31 DIAGNOSIS — Y95 Nosocomial condition: Secondary | ICD-10-CM | POA: Diagnosis not present

## 2016-12-31 DIAGNOSIS — N3021 Other chronic cystitis with hematuria: Secondary | ICD-10-CM | POA: Diagnosis not present

## 2016-12-31 DIAGNOSIS — R319 Hematuria, unspecified: Secondary | ICD-10-CM

## 2016-12-31 DIAGNOSIS — S32501A Unspecified fracture of right pubis, initial encounter for closed fracture: Secondary | ICD-10-CM | POA: Diagnosis not present

## 2016-12-31 DIAGNOSIS — Z8572 Personal history of non-Hodgkin lymphomas: Secondary | ICD-10-CM

## 2016-12-31 DIAGNOSIS — K219 Gastro-esophageal reflux disease without esophagitis: Secondary | ICD-10-CM | POA: Diagnosis present

## 2016-12-31 DIAGNOSIS — Z7189 Other specified counseling: Secondary | ICD-10-CM | POA: Diagnosis not present

## 2016-12-31 DIAGNOSIS — S299XXA Unspecified injury of thorax, initial encounter: Secondary | ICD-10-CM | POA: Diagnosis not present

## 2016-12-31 DIAGNOSIS — S32591A Other specified fracture of right pubis, initial encounter for closed fracture: Secondary | ICD-10-CM | POA: Diagnosis not present

## 2016-12-31 DIAGNOSIS — R112 Nausea with vomiting, unspecified: Secondary | ICD-10-CM | POA: Diagnosis not present

## 2016-12-31 LAB — COMPREHENSIVE METABOLIC PANEL
ALK PHOS: 149 U/L — AB (ref 38–126)
ALT: 13 U/L — ABNORMAL LOW (ref 14–54)
ANION GAP: 10 (ref 5–15)
AST: 24 U/L (ref 15–41)
Albumin: 3 g/dL — ABNORMAL LOW (ref 3.5–5.0)
BUN: 20 mg/dL (ref 6–20)
CO2: 26 mmol/L (ref 22–32)
Calcium: 9.1 mg/dL (ref 8.9–10.3)
Chloride: 102 mmol/L (ref 101–111)
Creatinine, Ser: 0.91 mg/dL (ref 0.44–1.00)
GFR, EST NON AFRICAN AMERICAN: 52 mL/min — AB (ref >60)
Glucose, Bld: 128 mg/dL — ABNORMAL HIGH (ref 65–99)
Potassium: 4.3 mmol/L (ref 3.5–5.1)
SODIUM: 138 mmol/L (ref 135–145)
Total Bilirubin: 0.6 mg/dL (ref 0.3–1.2)
Total Protein: 7.3 g/dL (ref 6.5–8.1)

## 2016-12-31 LAB — CBC WITH DIFFERENTIAL/PLATELET
Basophils Absolute: 0.2 10*3/uL — ABNORMAL HIGH (ref 0–0.1)
Basophils Relative: 1 %
EOS ABS: 0.2 10*3/uL (ref 0–0.7)
EOS PCT: 2 %
HCT: 36 % (ref 35.0–47.0)
HEMOGLOBIN: 11.8 g/dL — AB (ref 12.0–16.0)
LYMPHS ABS: 2 10*3/uL (ref 1.0–3.6)
Lymphocytes Relative: 13 %
MCH: 27.6 pg (ref 26.0–34.0)
MCHC: 32.8 g/dL (ref 32.0–36.0)
MCV: 84.2 fL (ref 80.0–100.0)
MONOS PCT: 5 %
Monocytes Absolute: 0.7 10*3/uL (ref 0.2–0.9)
Neutro Abs: 11.6 10*3/uL — ABNORMAL HIGH (ref 1.4–6.5)
Neutrophils Relative %: 79 %
PLATELETS: 569 10*3/uL — AB (ref 150–440)
RBC: 4.28 MIL/uL (ref 3.80–5.20)
RDW: 20.6 % — ABNORMAL HIGH (ref 11.5–14.5)
WBC: 14.6 10*3/uL — ABNORMAL HIGH (ref 3.6–11.0)

## 2016-12-31 LAB — BRAIN NATRIURETIC PEPTIDE: B NATRIURETIC PEPTIDE 5: 205 pg/mL — AB (ref 0.0–100.0)

## 2016-12-31 LAB — URINALYSIS, COMPLETE (UACMP) WITH MICROSCOPIC
Bilirubin Urine: NEGATIVE
Glucose, UA: NEGATIVE mg/dL
Hgb urine dipstick: NEGATIVE
Ketones, ur: NEGATIVE mg/dL
Nitrite: NEGATIVE
Protein, ur: NEGATIVE mg/dL
SPECIFIC GRAVITY, URINE: 1.012 (ref 1.005–1.030)
pH: 5 (ref 5.0–8.0)

## 2016-12-31 LAB — MRSA PCR SCREENING: MRSA BY PCR: NEGATIVE

## 2016-12-31 LAB — LACTIC ACID, PLASMA
Lactic Acid, Venous: 2.7 mmol/L (ref 0.5–1.9)
Lactic Acid, Venous: 2.5 mmol/L (ref 0.5–1.9)

## 2016-12-31 LAB — PROTIME-INR
INR: 0.92
PROTHROMBIN TIME: 12.4 s (ref 11.4–15.2)

## 2016-12-31 LAB — CK: CK TOTAL: 67 U/L (ref 38–234)

## 2016-12-31 LAB — PROCALCITONIN: Procalcitonin: <0.1 ng/mL

## 2016-12-31 LAB — LIPASE, BLOOD: LIPASE: 28 U/L (ref 11–51)

## 2016-12-31 LAB — TROPONIN I: Troponin I: 0.07 ng/mL (ref <0.03)

## 2016-12-31 MED ORDER — DEXTROSE 5 % IV SOLN
2.0000 g | Freq: Once | INTRAVENOUS | Status: AC
  Administered 2016-12-31: 2 g via INTRAVENOUS
  Filled 2016-12-31: qty 2

## 2016-12-31 MED ORDER — RISAQUAD PO CAPS
1.0000 | ORAL_CAPSULE | Freq: Every day | ORAL | Status: DC
  Administered 2017-01-01 – 2017-01-03 (×3): 1 via ORAL
  Filled 2016-12-31 (×3): qty 1

## 2016-12-31 MED ORDER — SODIUM CHLORIDE 0.9 % IV SOLN
INTRAVENOUS | Status: DC
  Administered 2017-01-01 – 2017-01-02 (×2): via INTRAVENOUS

## 2016-12-31 MED ORDER — DIPHENHYDRAMINE HCL 25 MG PO CAPS: 25.0000 mg | ORAL_CAPSULE | Freq: Every evening | ORAL | Status: DC | PRN

## 2016-12-31 MED ORDER — ENSURE ENLIVE PO LIQD: 237.0000 mL | Freq: Three times a day (TID) | ORAL | Status: DC

## 2016-12-31 MED ORDER — PROPRANOLOL HCL 10 MG PO TABS
30.0000 mg | ORAL_TABLET | Freq: Two times a day (BID) | ORAL | Status: DC
  Administered 2017-01-01 – 2017-01-03 (×4): 30 mg via ORAL
  Filled 2016-12-31 (×9): qty 3

## 2016-12-31 MED ORDER — TRAZODONE HCL 50 MG PO TABS
25.0000 mg | ORAL_TABLET | Freq: Every day | ORAL | Status: DC
  Administered 2017-01-01: 25 mg via ORAL
  Filled 2016-12-31: qty 1

## 2016-12-31 MED ORDER — ACETAMINOPHEN 325 MG PO TABS
650.0000 mg | ORAL_TABLET | Freq: Two times a day (BID) | ORAL | Status: DC
  Administered 2017-01-01 – 2017-01-03 (×4): 650 mg via ORAL
  Filled 2016-12-31 (×4): qty 2

## 2016-12-31 MED ORDER — PANTOPRAZOLE SODIUM 40 MG PO TBEC
40.0000 mg | DELAYED_RELEASE_TABLET | Freq: Every day | ORAL | Status: DC
  Administered 2017-01-01 – 2017-01-03 (×3): 40 mg via ORAL
  Filled 2016-12-31 (×3): qty 1

## 2016-12-31 MED ORDER — ASPIRIN EC 81 MG PO TBEC
81.0000 mg | DELAYED_RELEASE_TABLET | Freq: Every day | ORAL | Status: DC
  Administered 2017-01-01 – 2017-01-03 (×3): 81 mg via ORAL
  Filled 2016-12-31 (×3): qty 1

## 2016-12-31 MED ORDER — DOCUSATE SODIUM 100 MG PO CAPS
100.0000 mg | ORAL_CAPSULE | Freq: Two times a day (BID) | ORAL | Status: DC | PRN
Start: 2016-12-31 — End: 2017-01-03

## 2016-12-31 MED ORDER — SODIUM CHLORIDE 0.9 % IV SOLN
1.0000 g | INTRAVENOUS | Status: DC
  Administered 2017-01-01 – 2017-01-02 (×2): 1 g via INTRAVENOUS
  Filled 2016-12-31 (×6): qty 1

## 2016-12-31 MED ORDER — VITAMIN B-12 1000 MCG PO TABS
1000.0000 ug | ORAL_TABLET | Freq: Every day | ORAL | Status: DC
  Administered 2017-01-01 – 2017-01-03 (×3): 1000 ug via ORAL
  Filled 2016-12-31 (×3): qty 1

## 2016-12-31 MED ORDER — SODIUM CHLORIDE 0.9 % IV BOLUS (SEPSIS)
1000.0000 mL | Freq: Once | INTRAVENOUS | Status: AC
  Administered 2016-12-31: 1000 mL via INTRAVENOUS

## 2016-12-31 MED ORDER — APIXABAN 2.5 MG PO TABS
2.5000 mg | ORAL_TABLET | Freq: Two times a day (BID) | ORAL | Status: DC
  Administered 2017-01-01 – 2017-01-03 (×4): 2.5 mg via ORAL
  Filled 2016-12-31 (×5): qty 1

## 2016-12-31 MED ORDER — TRAMADOL HCL 50 MG PO TABS
50.0000 mg | ORAL_TABLET | Freq: Two times a day (BID) | ORAL | Status: DC | PRN
  Administered 2017-01-01 – 2017-01-02 (×2): 50 mg via ORAL
  Filled 2016-12-31 (×2): qty 1

## 2016-12-31 MED ORDER — LEVOTHYROXINE SODIUM 100 MCG PO TABS
100.0000 ug | ORAL_TABLET | Freq: Every day | ORAL | Status: DC
  Administered 2017-01-01 – 2017-01-03 (×3): 100 ug via ORAL
  Filled 2016-12-31 (×3): qty 1

## 2016-12-31 MED ORDER — METOPROLOL TARTRATE 5 MG/5ML IV SOLN
5.0000 mg | Freq: Once | INTRAVENOUS | Status: AC
  Administered 2016-12-31: 5 mg via INTRAVENOUS
  Filled 2016-12-31: qty 5

## 2016-12-31 MED ORDER — VANCOMYCIN HCL IN DEXTROSE 1-5 GM/200ML-% IV SOLN
1000.0000 mg | Freq: Once | INTRAVENOUS | Status: AC
  Administered 2016-12-31: 1000 mg via INTRAVENOUS
  Filled 2016-12-31: qty 200

## 2016-12-31 MED ORDER — OXYCODONE-ACETAMINOPHEN 5-325 MG PO TABS: 1.0000 | ORAL_TABLET | Freq: Four times a day (QID) | ORAL | Status: DC | PRN

## 2016-12-31 MED ORDER — AMLODIPINE BESYLATE 10 MG PO TABS
10.0000 mg | ORAL_TABLET | Freq: Every day | ORAL | Status: DC
  Administered 2017-01-01 – 2017-01-03 (×3): 10 mg via ORAL
  Filled 2016-12-31 (×3): qty 1

## 2016-12-31 MED ORDER — IPRATROPIUM-ALBUTEROL 0.5-2.5 (3) MG/3ML IN SOLN
3.0000 mL | RESPIRATORY_TRACT | Status: DC
  Administered 2016-12-31 – 2017-01-01 (×7): 3 mL via RESPIRATORY_TRACT
  Filled 2016-12-31 (×6): qty 3

## 2016-12-31 NOTE — ED Notes (Signed)
ED Provider at bedside. 

## 2016-12-31 NOTE — ED Provider Notes (Signed)
[facility] Emergency Department Provider Note  ____________________________________________   First MD Initiated Contact with Patient 12/31/16 1322     (approximate)  I have reviewed the triage vital signs and the nursing notes.   HISTORY  Chief Complaint Fall  Level 5 caveat:  history/ROS limited by acute/critical illness  HPI Evelyn [name] is a 83 y.o. female who reportedly is minimally verbal at baseline due to chronic dementia and she is confined to her bed after several illnesses over the last few months.  She presents by EMS for evaluation after being found down on the ground and having tachycardia in the 130s.  The fall was unwitnessed and the circumstances unclear.  The patient is not able to provide any history or review of systems.  She is ill-appearing but nontoxic upon arrival, and actually denied having any pain when I ask her but is not oriented to person, place, or situation.  She has an elevated respiratory rate and an elevated heart rate upon arrival and her initial blood pressure was low but that improved rapidly and I suspect the first measurement was erroneous.No other history is available at this time.   Past Medical History:  Diagnosis Date  . Anemia    hx of blood transfusion  . Cancer ([place])    lymphomia  . Coarse tremors    head shakes if does not take propranolol  . Coronary artery disease   . GERD (gastroesophageal reflux disease)   . History of lymphoma   . Hyperlipidemia   . Hypertension   . Hypothyroidism   . Insomnia    takes remeron nightly  . Neuromuscular disorder ([place])   . Other chronic cystitis    Radiation , botox injections  . Self-catheterizes urinary bladder    twice daily  . Thyroid disease    had thyroid removed    Patient Active Problem List   Diagnosis Date Noted  . Community acquired pneumonia 12/31/2016  . Pelvic fracture ([place]) 12/31/2016  . Sepsis ([place]) 12/31/2016  . DNR (do not  resuscitate)   . Acute encephalopathy 11/14/2016  . Hypokalemia 11/14/2016  . Generalized weakness 11/14/2016  . Goals of care, counseling/discussion   . DNR (do not resuscitate) discussion   . Palliative care encounter   . UTI (urinary tract infection) 11/09/2016  . Diaper dermatitis 08/16/2016  . Decubitus ulcer of sacral region, stage 1 08/14/2016  . Urinary incontinence 06/07/2016  . Encounter for Medicare annual wellness exam 05/28/2016  . Inflamed external hemorrhoid 05/28/2016  . Aortic stenosis, mild 05/14/2016  . Hospital discharge follow-up 05/14/2016  . Skin benign neoplasm 01/26/2016  . Anorexia 06/22/2015  . Chronic diastolic heart failure ([place]) 03/04/2015  . Paroxysmal atrial fibrillation ([place]) 05/29/2014  . ESBL (extended spectrum beta-lactamase) producing bacteria infection 12/09/2013  . Unspecified vitamin D deficiency 11/25/2013  . Chronic insomnia 11/23/2013  . Bradycardia 04/10/2013  . Generalized muscle weakness 04/10/2013  . Anemia, iron deficiency 12/27/2012  . Anemia, pernicious 10/15/2011  . Spinal stenosis of lumbar region 10/15/2011  . Hypertension   . Hyperlipidemia   . Hypothyroidism   . Cystitis, chronic   . History of lymphoma     Past Surgical History:  Procedure Laterality Date  . ABDOMINAL HYSTERECTOMY  1970s  . APPENDECTOMY    . BACK SURGERY     lower back - bulging disk  . CARDIAC CATHETERIZATION  2005   2 cardiac stents  . CYSTOSCOPY  April 2013  . EYE SURGERY  bilateral cataracts  . JOINT REPLACEMENT  2012   right hip  . LUMBAR LAMINECTOMY/DECOMPRESSION MICRODISCECTOMY  01/24/2012   Procedure: LUMBAR LAMINECTOMY/DECOMPRESSION MICRODISCECTOMY 2 LEVELS;  Surgeon: Floyce Stakes, MD;  Location: [facility];  Service: Neurosurgery;  Laterality: Right;  Right Lumbar four five, lumbar five sacral one foraminotomy   . [place] SURGERY  2008   [place]  . STOMACH SURGERY     removal all but small portion of stomach out due to ulcers      Prior to Admission medications   Medication Sig Start Date End Date Taking? Authorizing Provider  acetaminophen (TYLENOL) 325 MG tablet Take 650 mg by mouth 2 (two) times daily.    Yes [provider]  acidophilus (RISAQUAD) CAPS capsule Take 1 capsule by mouth daily.   Yes [provider]  amLODipine (NORVASC) 10 MG tablet Take 1 tablet (10 mg total) by mouth daily. 10/06/16  Yes Max Sane, MD  aspirin EC 81 MG tablet Take 81 mg by mouth daily.   Yes [provider]  conjugated estrogens (PREMARIN) vaginal cream Place 1 Applicatorful vaginally every Monday, Wednesday, and Friday. Patient taking differently: Place 1 Applicatorful vaginally every Monday, Wednesday, and Friday. At bedtime 10/11/16  Yes Crecencio Mc, MD  dibucaine (NUPERCAINAL) 1 % ointment Apply topically 3 (three) times daily as needed for pain. From henmorrhoids Patient taking differently: Apply 1 application topically every 8 (eight) hours as needed for pain. From hemorrhoids 05/26/16  Yes Crecencio Mc, MD  diphenhydrAMINE (BENADRYL) 25 mg capsule Take 1 capsule (25 mg total) by mouth at bedtime as needed for sleep. 11/14/16  Yes Theodoro Grist, MD  [employer] [facility] EX) Apply 1 application topically 4 (four) times daily. To perineal   Yes [provider]  levothyroxine (SYNTHROID, LEVOTHROID) 100 MCG tablet Take 100 mcg by mouth daily.   Yes [provider]  mirtazapine (REMERON) 7.5 MG tablet Take 1 tablet (7.5 mg total) by mouth at bedtime. 06/22/15  Yes Crecencio Mc, MD  omeprazole (PRILOSEC) 20 MG capsule Take 20 mg by mouth daily.   Yes [provider]  propranolol (INDERAL) 20 MG tablet Take 1.5 tablets (30 mg total) by mouth 2 (two) times daily. 01/25/16  Yes Crecencio Mc, MD  tolterodine (DETROL) 2 MG tablet Take 4 mg by mouth daily.   Yes [provider]  traMADol (ULTRAM) 50 MG tablet Take by mouth every 8 (eight) hours as  needed.   Yes [provider]  traZODone (DESYREL) 50 MG tablet Take 25 mg by mouth at bedtime.   Yes [provider]  vitamin B-12 (CYANOCOBALAMIN) 1000 MCG tablet Take 1,000 mcg by mouth daily.   Yes [provider]  apixaban (ELIQUIS) 2.5 MG TABS tablet Take 2.5 mg by mouth every 12 (twelve) hours.     [provider]  Cranberry 250 MG TABS Take 1 tablet (250 mg total) by mouth 2 (two) times daily. Patient not taking: Reported on 12/31/2016 02/21/16   Zara Council A, PA-C  ertapenem 1 g in sodium chloride 0.9 % 50 mL Inject 1 g into the vein daily. Patient not taking: Reported on 12/31/2016 11/14/16   Theodoro Grist, MD  feeding supplement, ENSURE ENLIVE, (ENSURE ENLIVE) LIQD Take 237 mLs by mouth 3 (three) times daily between meals. 11/14/16   Theodoro Grist, MD  senna-docusate (SENOKOT-S) 8.6-50 MG tablet Take 1 tablet by mouth at bedtime as needed for mild constipation. Patient not taking: Reported  on 12/31/2016 11/14/16   Theodoro Grist, MD  triamcinolone cream (KENALOG) 0.1 % Apply 1 application topically 2 (two) times daily. To irritated area Patient not taking: Reported on 12/31/2016 06/20/16   Crecencio Mc, MD  zolpidem (AMBIEN) 5 MG tablet Take 1 tablet (5 mg total) by mouth at bedtime as needed for sleep. Patient not taking: Reported on 12/31/2016 11/14/16   Theodoro Grist, MD    Allergies Macrolides and ketolides; Oxycodone; Ciprofloxacin; Neomy-bacit-polymyx-pramoxine; Other; and Sulfa antibiotics  Family History  Problem Relation Age of Onset  . Diabetes Mother   . Heart disease Father   . Kidney disease Neg Hx   . Bladder Cancer Neg Hx     Social History Social History  Substance Use Topics  . Smoking status: Former Smoker    Quit date: 04/04/1971  . Smokeless tobacco: Never Used  . Alcohol use No    Review of Systems Level 5 caveat:  history/ROS limited by acute/critical  illness ____________________________________________   PHYSICAL EXAM:  VITAL SIGNS: ED Triage Vitals  Enc Vitals Group     BP 12/31/16 1304 94/73     Pulse Rate 12/31/16 1304 (!) 133     Resp 12/31/16 1304 14     Temp 12/31/16 1304 97.4 F (36.3 C)     Temp Source 12/31/16 1304 Oral     SpO2 12/31/16 1304 94 %     Weight 12/31/16 1307 59 kg (130 lb)     Height 12/31/16 1307 1.676 m (5\' 6" )     Head Circumference --      Peak Flow --      Pain Score --      Pain Loc --      Pain Edu? --      Excl. in [place]? --     Constitutional: Awake, not oriented to person, place, or situation.  Ill appearing but nontoxic. Eyes: Conjunctivae are normal.  Head: Atraumatic. Nose: No congestion/rhinnorhea. Mouth/Throat: Mucous membranes are moist. Neck: No stridor.  No meningeal signs.   Cardiovascular: Moderate tachycardia in the 130s, regular rhythm. Good peripheral circulation. Grossly normal heart sounds. Respiratory: Normal respiratory effort but increased rate with tachypnea ranging from the 20s to 30s.  No retractions. Lungs CTAB. Gastrointestinal: Soft with some tenderness to palpation of the right lower part of her abdomen that is nonspecific. Musculoskeletal: Pain with movement and palpation of the hips but no evidence of acute injury to her lower extremities or upper extremities.  She has chronic decreased use of her right hand Neurologic:  Able to participate and neurological exam but the patient has no gross focal neurological deficits or facial droop Skin:  Skin is warm, dry and intact. No rash noted.   ____________________________________________   LABS (all labs ordered are listed, but only abnormal results are displayed)  Labs Reviewed  TROPONIN I - Abnormal; Notable for the following:       Result Value   Troponin I 0.07 (*)    All other components within normal limits  CBC WITH DIFFERENTIAL/PLATELET - Abnormal; Notable for the following:    WBC 14.6 (*)    Hemoglobin  11.8 (*)    RDW 20.6 (*)    Platelets 569 (*)    Neutro Abs 11.6 (*)    Basophils Absolute 0.2 (*)    All other components within normal limits  COMPREHENSIVE METABOLIC PANEL - Abnormal; Notable for the following:    Glucose, Bld 128 (*)    Albumin 3.0 (*)  ALT 13 (*)    Alkaline Phosphatase 149 (*)    GFR calc non Af Amer 52 (*)    All other components within normal limits  LACTIC ACID, PLASMA - Abnormal; Notable for the following:    Lactic Acid, Venous 2.7 (*)    All other components within normal limits  LACTIC ACID, PLASMA - Abnormal; Notable for the following:    Lactic Acid, Venous 2.5 (*)    All other components within normal limits  BRAIN NATRIURETIC PEPTIDE - Abnormal; Notable for the following:    B Natriuretic Peptide 205.0 (*)    All other components within normal limits  URINALYSIS, COMPLETE (UACMP) WITH MICROSCOPIC - Abnormal; Notable for the following:    Color, Urine YELLOW (*)    APPearance TURBID (*)    Leukocytes, UA LARGE (*)    Bacteria, UA MANY (*)    Squamous Epithelial / LPF 0-5 (*)    All other components within normal limits  MRSA PCR SCREENING  CULTURE, BLOOD (ROUTINE X 2)  CULTURE, BLOOD (ROUTINE X 2)  URINE CULTURE  LIPASE, BLOOD  PROCALCITONIN  PROTIME-INR  CK  BASIC METABOLIC PANEL  CBC   ____________________________________________  EKG  ED ECG REPORT I, Patches Mcdonnell, the attending physician, personally viewed and interpreted this ECG.  Date: 12/31/2016 EKG Time: 13:13 Rate: 135 Rhythm: normal sinus rhythm QRS Axis: normal Intervals: normal ST/T Wave abnormalities: normal Conduction Disturbances: none Narrative Interpretation: unremarkable  ____________________________________________  RADIOLOGY   Ct Head Wo Contrast  Result Date: 12/31/2016 CLINICAL DATA:  Found down.  Decreased responsiveness. EXAM: CT HEAD WITHOUT CONTRAST TECHNIQUE: Contiguous axial images were obtained from the base of the skull through the vertex  without intravenous contrast. COMPARISON:  05/05/2015 FINDINGS: Brain: No evidence of acute infarction, hemorrhage, hydrocephalus, extra-axial collection or mass lesion/mass effect. Advanced chronic small vessel ischemia with gliosis confluent throughout the cerebral white matter. Remote lacunar infarct at the genu of the right internal capsule and in the left thalamus. Clustered remote infarcts in the right cerebellum. Asymmetric frontal horns is chronic and likely from coaptation on the right. Mild for age generalized volume loss. Vascular: Atherosclerotic calcification. Skull: No acute finding. Sinuses/Orbits: Chronic sinus opacification, progressed from prior in the hypoplastic left frontal sinus. IMPRESSION: 1. No acute finding or change from prior. 2. Advanced chronic small vessel disease. Electronically Signed   By: Monte Fantasia M.D.   On: 12/31/2016 15:15   Ct Chest Wo Contrast  Result Date: 12/31/2016 CLINICAL DATA:  Unwitnessed fall. Nonverbal and bedbound. History of lymphoma and skin cancer. EXAM: CT CHEST, ABDOMEN AND PELVIS WITHOUT CONTRAST TECHNIQUE: Multidetector CT imaging of the chest, abdomen and pelvis was performed following the standard protocol without IV contrast. COMPARISON:  12/04/2013 FINDINGS: CT CHEST FINDINGS Cardiovascular: Heart size normal. Coronary artery calcification is noted. Atherosclerotic calcification is noted in the wall of the thoracic aorta. Mediastinum/Nodes: No mediastinal lymphadenopathy. No evidence for gross hilar lymphadenopathy although assessment is limited by the lack of intravenous contrast on today's study. Tiny hiatal hernia noted. The esophagus has normal imaging features. There is no axillary lymphadenopathy. Lungs/Pleura: Patchy central airspace disease is identified in the right upper lobe, right middle lobe, and right lower lobe. There is some mild central airspace disease in the superior segment of the left lower lobe. No pulmonary edema or pleural  effusion. Musculoskeletal: Bone windows reveal no worrisome lytic or sclerotic osseous lesions. CT ABDOMEN PELVIS FINDINGS Hepatobiliary: No focal abnormality in the liver on this study without  intravenous contrast. Layering gallstones are evident. No intrahepatic or extrahepatic biliary dilation. Pancreas: No focal mass lesion. No dilatation of the main duct. No intraparenchymal cyst. No peripancreatic edema. Spleen: No splenomegaly. No focal mass lesion. Adrenals/Urinary Tract: Similar appearance of bilateral renal scarring. No hydronephrosis. Similar mild diffuse prominence of the right ureter. Left ureter unremarkable. The urinary bladder appears normal for the degree of distention. Stomach/Bowel: Status post distal gastrectomy with gastrojejunostomy. No small bowel wall thickening. No small bowel dilatation. Diverticular changes are noted in the left colon without evidence of diverticulitis. Vascular/Lymphatic: There is abdominal aortic atherosclerosis without aneurysm. There is no gastrohepatic or hepatoduodenal ligament lymphadenopathy. No intraperitoneal or retroperitoneal lymphadenopathy. No pelvic sidewall lymphadenopathy. Reproductive: Uterus is surgically absent. No right adnexal mass. 4.6 cm cystic lesion identified in the posterior left adnexal space. This has been present since at least 06/04/2009 and I remeasure it at 4.3 cm on that exam. Other: No intraperitoneal free fluid. Musculoskeletal: Comminuted fracture noted right superior pubic ramus with acute appearance. Bones are diffusely demineralized. IMPRESSION: 1. Asymmetric airspace disease, right greater than left suggesting pneumonia. Asymmetric pulmonary edema and pulmonary hemorrhage considered less likely possibilities. 2. Comminuted fracture right superior pubic ramus appears to be acute. 3. Status post distal gastrectomy. 4. Four point cm cystic lesion left stomach comet pelvis present since at least 06/04/2009 consistent with benign  process. Electronically Signed   By: Misty Stanley M.D.   On: 12/31/2016 15:45   Dg Chest Port 1 View  Result Date: 12/31/2016 CLINICAL DATA:  Status post fall today.  Patient found down. EXAM: PORTABLE CHEST 1 VIEW COMPARISON:  PA and lateral chest 11/09/2016 and 07/13/2016. FINDINGS: The patient is rotated on the exam. Lungs are clear. Heart size is normal. No pneumothorax or pleural fluid. No acute bony abnormality. Atherosclerosis noted. IMPRESSION: No acute disease. Atherosclerosis. Electronically Signed   By: Inge Rise M.D.   On: 12/31/2016 13:42   Ct Renal Stone Study  Result Date: 12/31/2016 CLINICAL DATA:  Unwitnessed fall. Nonverbal and bedbound. History of lymphoma and skin cancer. EXAM: CT CHEST, ABDOMEN AND PELVIS WITHOUT CONTRAST TECHNIQUE: Multidetector CT imaging of the chest, abdomen and pelvis was performed following the standard protocol without IV contrast. COMPARISON:  12/04/2013 FINDINGS: CT CHEST FINDINGS Cardiovascular: Heart size normal. Coronary artery calcification is noted. Atherosclerotic calcification is noted in the wall of the thoracic aorta. Mediastinum/Nodes: No mediastinal lymphadenopathy. No evidence for gross hilar lymphadenopathy although assessment is limited by the lack of intravenous contrast on today's study. Tiny hiatal hernia noted. The esophagus has normal imaging features. There is no axillary lymphadenopathy. Lungs/Pleura: Patchy central airspace disease is identified in the right upper lobe, right middle lobe, and right lower lobe. There is some mild central airspace disease in the superior segment of the left lower lobe. No pulmonary edema or pleural effusion. Musculoskeletal: Bone windows reveal no worrisome lytic or sclerotic osseous lesions. CT ABDOMEN PELVIS FINDINGS Hepatobiliary: No focal abnormality in the liver on this study without intravenous contrast. Layering gallstones are evident. No intrahepatic or extrahepatic biliary dilation. Pancreas:  No focal mass lesion. No dilatation of the main duct. No intraparenchymal cyst. No peripancreatic edema. Spleen: No splenomegaly. No focal mass lesion. Adrenals/Urinary Tract: Similar appearance of bilateral renal scarring. No hydronephrosis. Similar mild diffuse prominence of the right ureter. Left ureter unremarkable. The urinary bladder appears normal for the degree of distention. Stomach/Bowel: Status post distal gastrectomy with gastrojejunostomy. No small bowel wall thickening. No small bowel dilatation. Diverticular changes are  noted in the left colon without evidence of diverticulitis. Vascular/Lymphatic: There is abdominal aortic atherosclerosis without aneurysm. There is no gastrohepatic or hepatoduodenal ligament lymphadenopathy. No intraperitoneal or retroperitoneal lymphadenopathy. No pelvic sidewall lymphadenopathy. Reproductive: Uterus is surgically absent. No right adnexal mass. 4.6 cm cystic lesion identified in the posterior left adnexal space. This has been present since at least 06/04/2009 and I remeasure it at 4.3 cm on that exam. Other: No intraperitoneal free fluid. Musculoskeletal: Comminuted fracture noted right superior pubic ramus with acute appearance. Bones are diffusely demineralized. IMPRESSION: 1. Asymmetric airspace disease, right greater than left suggesting pneumonia. Asymmetric pulmonary edema and pulmonary hemorrhage considered less likely possibilities. 2. Comminuted fracture right superior pubic ramus appears to be acute. 3. Status post distal gastrectomy. 4. Four point cm cystic lesion left stomach comet pelvis present since at least 06/04/2009 consistent with benign process. Electronically Signed   By: Misty Stanley M.D.   On: 12/31/2016 15:45    ____________________________________________   PROCEDURES  Critical Care performed: Yes, see critical care procedure note(s)   Procedure(s) performed:   .Critical Care Performed by: Hinda Kehr Authorized by:  Hinda Kehr   Critical care provider statement:    Critical care time (minutes):  60   Critical care time was exclusive of:  Separately billable procedures and treating other patients   Critical care was necessary to treat or prevent imminent or life-threatening deterioration of the following conditions:  Sepsis   Critical care was time spent personally by me on the following activities:  Development of treatment plan with patient or surrogate, discussions with consultants, evaluation of patient's response to treatment, examination of patient, obtaining history from patient or surrogate, ordering and performing treatments and interventions, ordering and review of laboratory studies, ordering and review of radiographic studies, pulse oximetry, re-evaluation of patient's condition and review of old charts      ____________________________________________   INITIAL IMPRESSION / [place] / ED COURSE  Pertinent labs & imaging results that were available during my care of the patient were reviewed by me and considered in my medical decision making (see chart for details).  Family arrived shortly after the patient's arrival and was able to provide some additional background.  I am evaluating broadly for probable sepsis and have made her a code sepsis.  The family reports that she was very active and healthy up until about 8 months ago when she became ill with a UTI and is never recovered, having recurrent infections and becoming bedbound at a living facility.  I expressed to them my concern for a grave prognosis given her vital signs and the fact that she had an unwitnessed fall, but explained that I would evaluate broadly and provide empiric antibiotics and fluids.   Clinical Course as of Jan 01 2051  Nancy Fetter Dec 31, 2016  1425 Workup is consistent with sepsis due to urinary tract infection.  I will cover her broadly for healthcare associated UTI given her history of repeated urinary  tract infections and living in a nursing facility.  The patient has a normal blood pressure and her lactic acid does not suggest severe sepsis or septic shock.  I will give her fluids but not necessarily aim for 30 mL/kg.  [CF]  1440 I had a lengthy conversation with the 2 sons of the patient as well as the patient's daughter-in-law.  They are all in agreement and adamant that the patient did not wish to be intubated or to receive CPR when she was  capable of making her own decisions.  They do not specifically have DO NOT RESUSCITATE paperwork in place but would like to establish that documented.  I will help him with this because they are very clear about this being consistent with the patient's wishes.  We will treat her for sepsis due to UTI and I explained that she may get worse before she gets better based on her concerning vital signs.  She is also coughing in a way that suggests aspiration.  Given that she was found down, I will CT scan her head and will continue through chest and abdomen (non-contrast) because of the concern of aspiration and the possibility that her UTI may be the result of an intraabdominal infection or infected stone.   [CF]  7096 I updated the patient's family about the fact that I completed the DO NOT RESUSCITATE paperwork and that she appears to have pneumonia as well as an acute superior pubic ramus fracture in addition to the UTI.  I again expressed my concern about poor prognosis given her persistent tachycardia and tachypnea although she is now more awake and alert and looking around and following the conversation.  They understand the severity of her illness.  I spoke in person with the hospitalist who will admit.  [CF]    Clinical Course User Index [CF] Hinda Kehr, MD    ____________________________________________  FINAL CLINICAL IMPRESSION(S) / ED DIAGNOSES  Final diagnoses:  Sepsis, due to unspecified organism [facility])  Fall, initial encounter  Closed fracture  of right superior pubic ramus, initial encounter ([place])  Healthcare-associated pneumonia  Dementia without behavioral disturbance, unspecified dementia type  Urinary tract infection with hematuria, site unspecified  Demand ischemia ([facility])  Elevated troponin I level  Elevated lactic acid level     MEDICATIONS GIVEN DURING THIS VISIT:  Medications  aspirin EC tablet 81 mg (not administered)  traMADol (ULTRAM) tablet 50 mg (not administered)  traZODone (DESYREL) tablet 25 mg (not administered)  acidophilus (RISAQUAD) capsule 1 capsule (not administered)  diphenhydrAMINE (BENADRYL) capsule 25 mg (not administered)  feeding supplement (ENSURE ENLIVE) (ENSURE ENLIVE) liquid 237 mL (not administered)  acetaminophen (TYLENOL) tablet 650 mg (not administered)  amLODipine (NORVASC) tablet 10 mg (not administered)  propranolol (INDERAL) tablet 30 mg (not administered)  apixaban (ELIQUIS) tablet 2.5 mg (not administered)  levothyroxine (SYNTHROID, LEVOTHROID) tablet 100 mcg (not administered)  pantoprazole (PROTONIX) EC tablet 40 mg (not administered)  vitamin B-12 (CYANOCOBALAMIN) tablet 1,000 mcg (not administered)  docusate sodium (COLACE) capsule 100 mg (not administered)  ertapenem (INVANZ) 1 g in sodium chloride 0.9 % 50 mL IVPB (not administered)  0.9 %  sodium chloride infusion (not administered)  oxyCODONE-acetaminophen (PERCOCET/ROXICET) 5-325 MG per tablet 1 tablet (not administered)  ipratropium-albuterol (DUONEB) 0.5-2.5 (3) MG/3ML nebulizer solution 3 mL (3 mLs Nebulization Given 12/31/16 1959)  sodium chloride 0.9 % bolus 1,000 mL (0 mLs Intravenous Stopped 12/31/16 1524)    And  sodium chloride 0.9 % bolus 1,000 mL (1,000 mLs Intravenous Transfusing/Transfer 12/31/16 1731)  ceFEPIme (MAXIPIME) 2 g in dextrose 5 % 50 mL IVPB (0 g Intravenous Stopped 12/31/16 1555)  vancomycin (VANCOCIN) IVPB 1000 mg/200 mL premix (1,000 mg Intravenous Transfusing/Transfer 12/31/16 1737)  metoprolol  tartrate (LOPRESSOR) injection 5 mg (5 mg Intravenous Given 12/31/16 1919)     NEW OUTPATIENT MEDICATIONS STARTED DURING THIS VISIT:  Current Discharge Medication List      Current Discharge Medication List      Current Discharge Medication List  Note:  This document was prepared using Dragon voice recognition software and may include unintentional dictation errors.    Hinda Kehr, MD 12/31/16 2052

## 2016-12-31 NOTE — H&P (Signed)
[place] [facility] [place] NAME: Evelyn [name]    MR#:  [id number]  DATE OF BIRTH:  09/30/1919  DATE OF ADMISSION:  12/31/2016  PRIMARY CARE PHYSICIAN: Crecencio Mc, MD   REQUESTING/REFERRING PHYSICIAN: Marcos Eke  CHIEF COMPLAINT:   Chief Complaint  Patient presents with  . Fall    HISTORY OF PRESENT ILLNESS: Evelyn [name]  is a 83 y.o. female with a known history of Anemia, lymphoma, coronary artery disease, hyperlipidemia, hypertension, hypothyroidism, insomnia, thyroid disease- lives in assisted living place and was admitted to the hospital a few weeks ago with UTI which was found to be ESBL. Since that UTI she was sent to rehabilitation and then she went back to her living place but she is bedbound and wheelchair bound and needs help with mobilities. She was also on pured diet in the hospital and on discharge was advised to have soft foods and thickened liquids. Family doesn't know when and how she fell down but today she was sent from the group home to the hospital because of a fall. In ER on further work ups, she is noted to have UTI, pneumonia, pelvic fracture. She is also tachycardic and tachypneic in ER and so given to hospitalist team for further management.  PAST MEDICAL HISTORY:   Past Medical History:  Diagnosis Date  . Anemia    hx of blood transfusion  . Cancer ([place])    lymphomia  . Coarse tremors    head shakes if does not take propranolol  . Coronary artery disease   . GERD (gastroesophageal reflux disease)   . History of lymphoma   . Hyperlipidemia   . Hypertension   . Hypothyroidism   . Insomnia    takes remeron nightly  . Neuromuscular disorder ([place])   . Other chronic cystitis    Radiation , botox injections  . Self-catheterizes urinary bladder    twice daily  . Thyroid disease    had thyroid removed    PAST SURGICAL HISTORY: Past Surgical History:  Procedure Laterality Date  . ABDOMINAL HYSTERECTOMY  1970s   . APPENDECTOMY    . BACK SURGERY     lower back - bulging disk  . CARDIAC CATHETERIZATION  2005   2 cardiac stents  . CYSTOSCOPY  April 2013  . EYE SURGERY     bilateral cataracts  . JOINT REPLACEMENT  2012   right hip  . LUMBAR LAMINECTOMY/DECOMPRESSION MICRODISCECTOMY  01/24/2012   Procedure: LUMBAR LAMINECTOMY/DECOMPRESSION MICRODISCECTOMY 2 LEVELS;  Surgeon: Floyce Stakes, MD;  Location: [facility];  Service: Neurosurgery;  Laterality: Right;  Right Lumbar four five, lumbar five sacral one foraminotomy   . Urania SURGERY  2008   [place]  . STOMACH SURGERY     removal all but small portion of stomach out due to ulcers    SOCIAL HISTORY:  Social History  Substance Use Topics  . Smoking status: Former Smoker    Quit date: 04/04/1971  . Smokeless tobacco: Never Used  . Alcohol use No    FAMILY HISTORY:  Family History  Problem Relation Age of Onset  . Diabetes Mother   . Heart disease Father   . Kidney disease Neg Hx   . Bladder Cancer Neg Hx     DRUG ALLERGIES:  Allergies  Allergen Reactions  . Macrolides And Ketolides Other (See Comments)  . Oxycodone Nausea Only  . Ciprofloxacin Swelling, Rash and Other (See Comments)  . Neomy-Bacit-Polymyx-Pramoxine Rash    "  All antibiotics"  . Other Rash    "All antibiotics"  . Sulfa Antibiotics Swelling, Rash and Other (See Comments)    REVIEW OF SYSTEMS:   Patient have dementia and she is hard of hearing so does not give me any review of system.  MEDICATIONS AT HOME:  Prior to Admission medications   Medication Sig Start Date End Date Taking? Authorizing Provider  acetaminophen (TYLENOL) 325 MG tablet Take 650 mg by mouth 2 (two) times daily.    Yes [provider]  acidophilus (RISAQUAD) CAPS capsule Take 1 capsule by mouth daily.   Yes [provider]  amLODipine (NORVASC) 10 MG tablet Take 1 tablet (10 mg total) by mouth daily. 10/06/16  Yes Max Sane, MD  aspirin EC 81 MG tablet Take 81 mg by  mouth daily.   Yes [provider]  conjugated estrogens (PREMARIN) vaginal cream Place 1 Applicatorful vaginally every Monday, Wednesday, and Friday. Patient taking differently: Place 1 Applicatorful vaginally every Monday, Wednesday, and Friday. At bedtime 10/11/16  Yes Crecencio Mc, MD  dibucaine (NUPERCAINAL) 1 % ointment Apply topically 3 (three) times daily as needed for pain. From henmorrhoids Patient taking differently: Apply 1 application topically every 8 (eight) hours as needed for pain. From hemorrhoids 05/26/16  Yes Crecencio Mc, MD  diphenhydrAMINE (BENADRYL) 25 mg capsule Take 1 capsule (25 mg total) by mouth at bedtime as needed for sleep. 11/14/16  Yes Theodoro Grist, MD  [employer] [facility] EX) Apply 1 application topically 4 (four) times daily. To perineal   Yes [provider]  levothyroxine (SYNTHROID, LEVOTHROID) 100 MCG tablet Take 100 mcg by mouth daily.   Yes [provider]  mirtazapine (REMERON) 7.5 MG tablet Take 1 tablet (7.5 mg total) by mouth at bedtime. 06/22/15  Yes Crecencio Mc, MD  omeprazole (PRILOSEC) 20 MG capsule Take 20 mg by mouth daily.   Yes [provider]  propranolol (INDERAL) 20 MG tablet Take 1.5 tablets (30 mg total) by mouth 2 (two) times daily. 01/25/16  Yes Crecencio Mc, MD  tolterodine (DETROL) 2 MG tablet Take 4 mg by mouth daily.   Yes [provider]  traMADol (ULTRAM) 50 MG tablet Take by mouth every 8 (eight) hours as needed.   Yes [provider]  traZODone (DESYREL) 50 MG tablet Take 25 mg by mouth at bedtime.   Yes [provider]  vitamin B-12 (CYANOCOBALAMIN) 1000 MCG tablet Take 1,000 mcg by mouth daily.   Yes [provider]  apixaban (ELIQUIS) 2.5 MG TABS tablet Take 2.5 mg by mouth every 12 (twelve) hours.     [provider]  Cranberry 250 MG TABS Take 1 tablet (250 mg total) by mouth 2 (two) times daily. Patient not taking:  Reported on 12/31/2016 02/21/16   Zara Council A, PA-C  ertapenem 1 g in sodium chloride 0.9 % 50 mL Inject 1 g into the vein daily. Patient not taking: Reported on 12/31/2016 11/14/16   Theodoro Grist, MD  feeding supplement, ENSURE ENLIVE, (ENSURE ENLIVE) LIQD Take 237 mLs by mouth 3 (three) times daily between meals. 11/14/16   Theodoro Grist, MD  senna-docusate (SENOKOT-S) 8.6-50 MG tablet Take 1 tablet by mouth at bedtime as needed for mild constipation. Patient not taking: Reported on 12/31/2016 11/14/16   Theodoro Grist, MD  triamcinolone cream (KENALOG) 0.1 % Apply 1 application topically 2 (two) times daily. To irritated area Patient not taking: Reported on 12/31/2016 06/20/16   Derrel Nip,  Aris Everts, MD  zolpidem (AMBIEN) 5 MG tablet Take 1 tablet (5 mg total) by mouth at bedtime as needed for sleep. Patient not taking: Reported on 12/31/2016 11/14/16   Theodoro Grist, MD      PHYSICAL EXAMINATION:   VITAL SIGNS: Blood pressure 138/83, pulse (!) 132, temperature 97.4 F (36.3 C), temperature source Oral, resp. rate (!) 35, height 5\' 6"  (1.676 m), weight 59 kg (130 lb), SpO2 96 %.  GENERAL:  83 y.o.-year-old Thin patient lying in the bed with no acute distress.  EYES: Pupils equal, round, reactive to light and accommodation. No scleral icterus. Extraocular muscles intact.  HEENT: Head atraumatic, normocephalic. Oropharynx and nasopharynx clear. Oral mucosa dry. NECK:  Supple, no jugular venous distention. No thyroid enlargement, no tenderness.  LUNGS: Normal breath sounds bilaterally, some wheezing, some crepitation. Positive use of accessory muscles of respiration.  CARDIOVASCULAR: S1, S2 normal. No murmurs, rubs, or gallops.  ABDOMEN: Soft, nontender, nondistended. Bowel sounds present. No organomegaly or mass.  EXTREMITIES: No pedal edema, cyanosis, or clubbing.  NEUROLOGIC: Cranial nerves II through XII are intact. Muscle strength 3/5 in all extremities, generalized weakness. Sensation intact.  Gait not checked.  PSYCHIATRIC: The patient is alert and oriented x 1.  SKIN: No obvious rash, lesion, or ulcer.   LABORATORY PANEL:   CBC  Recent Labs Lab 12/31/16 1331  WBC 14.6*  HGB 11.8*  HCT 36.0  PLT 569*  MCV 84.2  MCH 27.6  MCHC 32.8  RDW 20.6*  LYMPHSABS 2.0  MONOABS 0.7  EOSABS 0.2  BASOSABS 0.2*   ------------------------------------------------------------------------------------------------------------------  Chemistries   Recent Labs Lab 12/31/16 1331  NA 138  K 4.3  CL 102  CO2 26  GLUCOSE 128*  BUN 20  CREATININE 0.91  CALCIUM 9.1  AST 24  ALT 13*  ALKPHOS 149*  BILITOT 0.6   ------------------------------------------------------------------------------------------------------------------ estimated creatinine clearance is 35.2 mL/min (by C-G formula based on SCr of 0.91 mg/dL). ------------------------------------------------------------------------------------------------------------------ No results for input(s): TSH, T4TOTAL, T3FREE, THYROIDAB in the last 72 hours.  Invalid input(s): FREET3   Coagulation profile  Recent Labs Lab 12/31/16 1332  INR 0.92   ------------------------------------------------------------------------------------------------------------------- No results for input(s): DDIMER in the last 72 hours. -------------------------------------------------------------------------------------------------------------------  Cardiac Enzymes  Recent Labs Lab 12/31/16 1331  TROPONINI 0.07*   ------------------------------------------------------------------------------------------------------------------ Invalid input(s): POCBNP  ---------------------------------------------------------------------------------------------------------------  Urinalysis    Component Value Date/Time   COLORURINE YELLOW (A) 12/31/2016 1349   APPEARANCEUR TURBID (A) 12/31/2016 1349   APPEARANCEUR Cloudy (A) 02/21/2016 1409    LABSPEC 1.012 12/31/2016 1349   LABSPEC 1.006 12/26/2013 1150   PHURINE 5.0 12/31/2016 1349   GLUCOSEU NEGATIVE 12/31/2016 1349   GLUCOSEU NEGATIVE 01/25/2016 1602   HGBUR NEGATIVE 12/31/2016 1349   BILIRUBINUR NEGATIVE 12/31/2016 1349   BILIRUBINUR negative 10/11/2016 1653   BILIRUBINUR Negative 02/21/2016 1409   BILIRUBINUR Negative 12/26/2013 1150   KETONESUR NEGATIVE 12/31/2016 1349   PROTEINUR NEGATIVE 12/31/2016 1349   UROBILINOGEN 0.2 10/11/2016 1653   UROBILINOGEN 0.2 01/25/2016 1602   NITRITE NEGATIVE 12/31/2016 1349   LEUKOCYTESUR LARGE (A) 12/31/2016 1349   LEUKOCYTESUR 3+ (A) 02/21/2016 1409   LEUKOCYTESUR 3+ 12/26/2013 1150     RADIOLOGY: Ct Head Wo Contrast  Result Date: 12/31/2016 CLINICAL DATA:  Found down.  Decreased responsiveness. EXAM: CT HEAD WITHOUT CONTRAST TECHNIQUE: Contiguous axial images were obtained from the base of the skull through the vertex without intravenous contrast. COMPARISON:  05/05/2015 FINDINGS: Brain: No evidence of acute infarction, hemorrhage, hydrocephalus, extra-axial collection or mass  lesion/mass effect. Advanced chronic small vessel ischemia with gliosis confluent throughout the cerebral white matter. Remote lacunar infarct at the genu of the right internal capsule and in the left thalamus. Clustered remote infarcts in the right cerebellum. Asymmetric frontal horns is chronic and likely from coaptation on the right. Mild for age generalized volume loss. Vascular: Atherosclerotic calcification. Skull: No acute finding. Sinuses/Orbits: Chronic sinus opacification, progressed from prior in the hypoplastic left frontal sinus. IMPRESSION: 1. No acute finding or change from prior. 2. Advanced chronic small vessel disease. Electronically Signed   By: Monte Fantasia M.D.   On: 12/31/2016 15:15   Ct Chest Wo Contrast  Result Date: 12/31/2016 CLINICAL DATA:  Unwitnessed fall. Nonverbal and bedbound. History of lymphoma and skin cancer. EXAM: CT CHEST,  ABDOMEN AND PELVIS WITHOUT CONTRAST TECHNIQUE: Multidetector CT imaging of the chest, abdomen and pelvis was performed following the standard protocol without IV contrast. COMPARISON:  12/04/2013 FINDINGS: CT CHEST FINDINGS Cardiovascular: Heart size normal. Coronary artery calcification is noted. Atherosclerotic calcification is noted in the wall of the thoracic aorta. Mediastinum/Nodes: No mediastinal lymphadenopathy. No evidence for gross hilar lymphadenopathy although assessment is limited by the lack of intravenous contrast on today's study. Tiny hiatal hernia noted. The esophagus has normal imaging features. There is no axillary lymphadenopathy. Lungs/Pleura: Patchy central airspace disease is identified in the right upper lobe, right middle lobe, and right lower lobe. There is some mild central airspace disease in the superior segment of the left lower lobe. No pulmonary edema or pleural effusion. Musculoskeletal: Bone windows reveal no worrisome lytic or sclerotic osseous lesions. CT ABDOMEN PELVIS FINDINGS Hepatobiliary: No focal abnormality in the liver on this study without intravenous contrast. Layering gallstones are evident. No intrahepatic or extrahepatic biliary dilation. Pancreas: No focal mass lesion. No dilatation of the main duct. No intraparenchymal cyst. No peripancreatic edema. Spleen: No splenomegaly. No focal mass lesion. Adrenals/Urinary Tract: Similar appearance of bilateral renal scarring. No hydronephrosis. Similar mild diffuse prominence of the right ureter. Left ureter unremarkable. The urinary bladder appears normal for the degree of distention. Stomach/Bowel: Status post distal gastrectomy with gastrojejunostomy. No small bowel wall thickening. No small bowel dilatation. Diverticular changes are noted in the left colon without evidence of diverticulitis. Vascular/Lymphatic: There is abdominal aortic atherosclerosis without aneurysm. There is no gastrohepatic or hepatoduodenal  ligament lymphadenopathy. No intraperitoneal or retroperitoneal lymphadenopathy. No pelvic sidewall lymphadenopathy. Reproductive: Uterus is surgically absent. No right adnexal mass. 4.6 cm cystic lesion identified in the posterior left adnexal space. This has been present since at least 06/04/2009 and I remeasure it at 4.3 cm on that exam. Other: No intraperitoneal free fluid. Musculoskeletal: Comminuted fracture noted right superior pubic ramus with acute appearance. Bones are diffusely demineralized. IMPRESSION: 1. Asymmetric airspace disease, right greater than left suggesting pneumonia. Asymmetric pulmonary edema and pulmonary hemorrhage considered less likely possibilities. 2. Comminuted fracture right superior pubic ramus appears to be acute. 3. Status post distal gastrectomy. 4. Four point cm cystic lesion left stomach comet pelvis present since at least 06/04/2009 consistent with benign process. Electronically Signed   By: Misty Stanley M.D.   On: 12/31/2016 15:45   Dg Chest Port 1 View  Result Date: 12/31/2016 CLINICAL DATA:  Status post fall today.  Patient found down. EXAM: PORTABLE CHEST 1 VIEW COMPARISON:  PA and lateral chest 11/09/2016 and 07/13/2016. FINDINGS: The patient is rotated on the exam. Lungs are clear. Heart size is normal. No pneumothorax or pleural fluid. No acute bony abnormality. Atherosclerosis noted.  IMPRESSION: No acute disease. Atherosclerosis. Electronically Signed   By: Inge Rise M.D.   On: 12/31/2016 13:42   Ct Renal Stone Study  Result Date: 12/31/2016 CLINICAL DATA:  Unwitnessed fall. Nonverbal and bedbound. History of lymphoma and skin cancer. EXAM: CT CHEST, ABDOMEN AND PELVIS WITHOUT CONTRAST TECHNIQUE: Multidetector CT imaging of the chest, abdomen and pelvis was performed following the standard protocol without IV contrast. COMPARISON:  12/04/2013 FINDINGS: CT CHEST FINDINGS Cardiovascular: Heart size normal. Coronary artery calcification is noted.  Atherosclerotic calcification is noted in the wall of the thoracic aorta. Mediastinum/Nodes: No mediastinal lymphadenopathy. No evidence for gross hilar lymphadenopathy although assessment is limited by the lack of intravenous contrast on today's study. Tiny hiatal hernia noted. The esophagus has normal imaging features. There is no axillary lymphadenopathy. Lungs/Pleura: Patchy central airspace disease is identified in the right upper lobe, right middle lobe, and right lower lobe. There is some mild central airspace disease in the superior segment of the left lower lobe. No pulmonary edema or pleural effusion. Musculoskeletal: Bone windows reveal no worrisome lytic or sclerotic osseous lesions. CT ABDOMEN PELVIS FINDINGS Hepatobiliary: No focal abnormality in the liver on this study without intravenous contrast. Layering gallstones are evident. No intrahepatic or extrahepatic biliary dilation. Pancreas: No focal mass lesion. No dilatation of the main duct. No intraparenchymal cyst. No peripancreatic edema. Spleen: No splenomegaly. No focal mass lesion. Adrenals/Urinary Tract: Similar appearance of bilateral renal scarring. No hydronephrosis. Similar mild diffuse prominence of the right ureter. Left ureter unremarkable. The urinary bladder appears normal for the degree of distention. Stomach/Bowel: Status post distal gastrectomy with gastrojejunostomy. No small bowel wall thickening. No small bowel dilatation. Diverticular changes are noted in the left colon without evidence of diverticulitis. Vascular/Lymphatic: There is abdominal aortic atherosclerosis without aneurysm. There is no gastrohepatic or hepatoduodenal ligament lymphadenopathy. No intraperitoneal or retroperitoneal lymphadenopathy. No pelvic sidewall lymphadenopathy. Reproductive: Uterus is surgically absent. No right adnexal mass. 4.6 cm cystic lesion identified in the posterior left adnexal space. This has been present since at least 06/04/2009 and I  remeasure it at 4.3 cm on that exam. Other: No intraperitoneal free fluid. Musculoskeletal: Comminuted fracture noted right superior pubic ramus with acute appearance. Bones are diffusely demineralized. IMPRESSION: 1. Asymmetric airspace disease, right greater than left suggesting pneumonia. Asymmetric pulmonary edema and pulmonary hemorrhage considered less likely possibilities. 2. Comminuted fracture right superior pubic ramus appears to be acute. 3. Status post distal gastrectomy. 4. Four point cm cystic lesion left stomach comet pelvis present since at least 06/04/2009 consistent with benign process. Electronically Signed   By: Misty Stanley M.D.   On: 12/31/2016 15:45    EKG: Orders placed or performed during the hospital encounter of 12/31/16  . EKG 12-Lead  . EKG 12-Lead  . ED EKG  . ED EKG    IMPRESSION AND PLAN:  * Fall   Secondary to UTI and pneumonia.   Recent UTI with ESBL.   So I will treat with ertapenem this time.  * Sepsis   Evident by tachycardia, tachypnea, elevated white blood cell count.   Secondary to UTI and pneumonia, given IV fluid and monitor.   Antibiotic as above.  * Dysphagia   I will give him nothing by mouth except medications at this time and get speech and swallow evaluation.    * Hypertension   Continue home medications.  * Hypothyroidism   Continue levothyroxine.      All the records are reviewed and case discussed with ED  provider. Management plans discussed with the patient, family and they are in agreement.  CODE STATUS: DO NOT RESUSCITATE. Code Status History    Date Active Date Inactive Code Status Order ID Comments User Context   11/14/2016  3:16 PM 11/15/2016  2:57 PM DNR [id number]  Pershing Proud, NP Inpatient   11/09/2016  1:00 PM 11/14/2016  3:16 PM Full Code [id number]  Demetrios Loll, MD Inpatient   10/03/2016 11:40 PM 10/06/2016  6:25 PM Full Code [id number]  Harvie Bridge, DO Inpatient   05/06/2016  1:01 AM 05/09/2016  6:43 PM Full  Code [id number]  Lance Coon, MD Inpatient   05/05/2015  8:48 PM 05/10/2015  5:35 PM Full Code [id number]  Henreitta Leber, MD Inpatient    Questions for Most Recent Historical Code Status (Order [id number])    Question Answer Comment   In the event of cardiac or respiratory ARREST Do not call a "code blue"    In the event of cardiac or respiratory ARREST Do not perform Intubation, CPR, defibrillation or ACLS    In the event of cardiac or respiratory ARREST Use medication by any route, position, wound care, and other measures to relive pain and suffering. May use oxygen, suction and manual treatment of airway obstruction as needed for comfort.      Discussed with patient's son, daughter-in-law, granddaughter in the room.  TOTAL TIME TAKING CARE OF THIS PATIENT: 50 minutes.    Vaughan Basta M.D on 12/31/2016   Between 7am to 6pm - Pager - [phone]  After 6pm go to [email or web address] - password [email or web address] [facility]  Office  [phone]  CC: Primary care physician; Crecencio Mc, MD   Note: This dictation was prepared with Dragon dictation along with smaller phrase technology. Any transcriptional errors that result from this process are unintentional.

## 2016-12-31 NOTE — Progress Notes (Signed)
Patient was admitted from ED via cart.  IV fluids running. incont of urine. Stage 2 ulcer to buttocks. History of many falls, low bed ordered. DNR and fall bracelet applied. O2 at 3L. Tele placed. Running ST, (was not addressed in ED), notified MD, gave orders for 1 time dose of Metoprolol 5mg , IV. Reviewed orders with family and POC started. Bed alarm on for safety.

## 2016-12-31 NOTE — ED Notes (Signed)
Patient transported to CT 

## 2016-12-31 NOTE — ED Triage Notes (Signed)
Patient arrived from homeplace by EMS for unwitnessed fall. Patient was found by staff face down. Pt is nonverbal and bedbound. EMS vitals 101/67, HR 135, 91O2 on 4L. HX lymphoma and skin cancer. Upon arrival to ER patient has stauration of 94 on RA

## 2017-01-01 ENCOUNTER — Inpatient Hospital Stay: Payer: PPO

## 2017-01-01 DIAGNOSIS — L8990 Pressure ulcer of unspecified site, unspecified stage: Secondary | ICD-10-CM | POA: Insufficient documentation

## 2017-01-01 LAB — BASIC METABOLIC PANEL
Anion gap: 9 (ref 5–15)
BUN: 19 mg/dL (ref 6–20)
CALCIUM: 8.3 mg/dL — AB (ref 8.9–10.3)
CO2: 25 mmol/L (ref 22–32)
Chloride: 109 mmol/L (ref 101–111)
Creatinine, Ser: 0.84 mg/dL (ref 0.44–1.00)
GFR calc Af Amer: >60 mL/min (ref >60)
GFR, EST NON AFRICAN AMERICAN: 58 mL/min — AB (ref >60)
Glucose, Bld: 109 mg/dL — ABNORMAL HIGH (ref 65–99)
Potassium: 3.6 mmol/L (ref 3.5–5.1)
SODIUM: 143 mmol/L (ref 135–145)

## 2017-01-01 LAB — CBC
HCT: 30.3 % — ABNORMAL LOW (ref 35.0–47.0)
HEMOGLOBIN: 9.8 g/dL — AB (ref 12.0–16.0)
MCH: 27.6 pg (ref 26.0–34.0)
MCHC: 32.3 g/dL (ref 32.0–36.0)
MCV: 85.5 fL (ref 80.0–100.0)
Platelets: 466 10*3/uL — ABNORMAL HIGH (ref 150–440)
RBC: 3.54 MIL/uL — AB (ref 3.80–5.20)
RDW: 20.6 % — ABNORMAL HIGH (ref 11.5–14.5)
WBC: 26.7 10*3/uL — ABNORMAL HIGH (ref 3.6–11.0)

## 2017-01-01 LAB — LACTIC ACID, PLASMA: LACTIC ACID, VENOUS: 1.9 mmol/L (ref 0.5–1.9)

## 2017-01-01 MED ORDER — IPRATROPIUM-ALBUTEROL 0.5-2.5 (3) MG/3ML IN SOLN
3.0000 mL | Freq: Four times a day (QID) | RESPIRATORY_TRACT | Status: DC
  Administered 2017-01-02 – 2017-01-04 (×10): 3 mL via RESPIRATORY_TRACT
  Filled 2017-01-01 (×10): qty 3

## 2017-01-01 MED ORDER — VANCOMYCIN HCL IN DEXTROSE 1-5 GM/200ML-% IV SOLN
1000.0000 mg | INTRAVENOUS | Status: DC
  Administered 2017-01-01 – 2017-01-02 (×2): 1000 mg via INTRAVENOUS
  Filled 2017-01-01 (×3): qty 200

## 2017-01-01 NOTE — Clinical Social Work Note (Signed)
Clinical Social Work Assessment  Patient Details  Name: Evelyn [name] MRN: [id number] Date of Birth: 1919-10-15  Date of referral:  01/01/17               Reason for consult:  Other (Comment Required) (From [facility] )                Permission sought to share information with:  Facility Art therapist granted to share information::  Yes, Verbal Permission Granted  Name::        Agency::     Relationship::     Contact Information:     Housing/Transportation Living arrangements for the past 2 months:  [place] of Information:  Adult Children Patient Interpreter Needed:  None Criminal Activity/Legal Involvement Pertinent to Current Situation/Hospitalization:  No - Comment as needed Significant Relationships:  Adult Children Lives with:  Facility Resident Do you feel safe going back to the place where you live?  Yes Need for family participation in patient care:  Yes (Comment)  Care giving concerns:  Patient is a resident at [facility] (fax: [phone]) followed by [place]/ [facility].    Social Worker assessment / plan:  Holiday representative (CSW) reviewed chart and noted that patient is from [facility]. CSW contacted Horris Latino, [place] who reported that patient is a resident, followed by hospice and has a hospital bed. Per Horris Latino patient does not walk and can return when stable. CSW met with patient and her son Evelyn [name] [phone] was at bedside. Patient did not participate in assessment. Per son he would like for patient to return to [facility]. Per son patient does not have oxygen at the ALF and is currently on acute oxygen. RN will try to wean patient off of oxygen. Plan is for patient to D/C back to [facility] and continue hospice services. CSW will continue to follow and assist as needed.   Employment status:  Retired Nurse, adult PT Recommendations:  Not  assessed at this time Information / Referral to community resources:  Other (Comment Required) (Patient will return to [facility] and continue hospice services. )  Patient/Family's Response to care:  Patient's son is agreeable for patient to return to [facility].   Patient/Family's Understanding of and Emotional Response to Diagnosis, Current Treatment, and Prognosis:  Patient's son was very pleasant and thanked CSW for visit.   Emotional Assessment Appearance:  Appears stated age Attitude/Demeanor/Rapport:    Affect (typically observed):  Pleasant Orientation:  Oriented to Self, Fluctuating Orientation (Suspected and/or reported Sundowners) Alcohol / Substance use:  Not Applicable Psych involvement (Current and /or in the community):  No (Comment)  Discharge Needs  Concerns to be addressed:  Discharge Planning Concerns Readmission within the last 30 days:  No Current discharge risk:  Chronically ill, Cognitively Impaired, Dependent with Mobility Barriers to Discharge:  Continued Medical Work up   [employer], Veronia Beets, LCSW 01/01/2017, 4:13 PM

## 2017-01-01 NOTE — NC FL2 (Signed)
[place] LEVEL OF CARE SCREENING TOOL     IDENTIFICATION  Patient Name: Evelyn [name] Birthdate: 23-Oct-1919 Sex: female Admission Date (Current Location): 12/31/2016  [facility] and [place] Number:  Engineering geologist and Address:  [facility], [address] [place], [place] [address]      Provider Number: [id number]  Attending Physician Name and Address:  Demetrios Loll, MD  Relative Name and Phone Number:       Current Level of Care: Hospital Recommended Level of Care: [place] Prior Approval Number:    Date Approved/Denied:   PASRR Number:  [id number] )  Discharge Plan: Domiciliary (Rest home)    Current Diagnoses: Patient Active Problem List   Diagnosis Date Noted  . Community acquired pneumonia 12/31/2016  . Pelvic fracture ([place]) 12/31/2016  . Sepsis ([place]) 12/31/2016  . DNR (do not resuscitate)   . Acute encephalopathy 11/14/2016  . Hypokalemia 11/14/2016  . Generalized weakness 11/14/2016  . Goals of care, counseling/discussion   . DNR (do not resuscitate) discussion   . Palliative care encounter   . UTI (urinary tract infection) 11/09/2016  . Diaper dermatitis 08/16/2016  . Decubitus ulcer of sacral region, stage 1 08/14/2016  . Urinary incontinence 06/07/2016  . Encounter for Medicare annual wellness exam 05/28/2016  . Inflamed external hemorrhoid 05/28/2016  . Aortic stenosis, mild 05/14/2016  . Hospital discharge follow-up 05/14/2016  . Skin benign neoplasm 01/26/2016  . Anorexia 06/22/2015  . Chronic diastolic heart failure ([place]) 03/04/2015  . Paroxysmal atrial fibrillation ([place]) 05/29/2014  . ESBL (extended spectrum beta-lactamase) producing bacteria infection 12/09/2013  . Unspecified vitamin D deficiency 11/25/2013  . Chronic insomnia 11/23/2013  . Bradycardia 04/10/2013  . Generalized muscle weakness 04/10/2013  . Anemia, iron deficiency 12/27/2012  . Anemia, pernicious 10/15/2011  .  Spinal stenosis of lumbar region 10/15/2011  . Hypertension   . Hyperlipidemia   . Hypothyroidism   . Cystitis, chronic   . History of lymphoma     Orientation RESPIRATION BLADDER Height & Weight     Self, Time, Situation, Place  O2 (3 Liters Oxygen ) Incontinent Weight: 130 lb (59 kg) Height:  5\' 6"  (167.6 cm)  BEHAVIORAL SYMPTOMS/MOOD NEUROLOGICAL BOWEL NUTRITION STATUS   (none)  (none) Continent Diet (Diet: DYS 1 )  AMBULATORY STATUS COMMUNICATION OF NEEDS Skin   Extensive Assist Verbally PU Stage and Appropriate Care (Pressure Ulcer Stage 2 on buttocks. )                       Personal Care Assistance Level of Assistance  Bathing, Feeding, Dressing Bathing Assistance: Limited assistance Feeding assistance: Independent Dressing Assistance: Limited assistance     Functional Limitations Info  Sight, Hearing, Speech Sight Info: Adequate Hearing Info: Impaired Speech Info: Adequate    SPECIAL CARE FACTORS FREQUENCY   (Hospice is following patient )                    Contractures      Additional Factors Info  Code Status, Allergies   On contact for ESBL in urine.  Code Status Info:  (DNR ) Allergies Info:  (Macrolides And Ketolides, Oxycodone, Ciprofloxacin, Neomy-bacit-polymyx-pramoxine, Other, Sulfa Antibiotics)           Current Medications (01/01/2017):  This is the current hospital active medication list Current Facility-Administered Medications  Medication Dose Route Frequency Provider Last Rate Last Dose  . 0.9 %  sodium chloride infusion  Intravenous Continuous Vaughan Basta, MD      . acetaminophen (TYLENOL) tablet 650 mg  650 mg Oral BID Vaughan Basta, MD   650 mg at 01/01/17 1058  . acidophilus (RISAQUAD) capsule 1 capsule  1 capsule Oral Daily Vaughan Basta, MD   1 capsule at 01/01/17 1058  . amLODipine (NORVASC) tablet 10 mg  10 mg Oral Daily Vaughan Basta, MD   10 mg at 01/01/17 1058  . apixaban  (ELIQUIS) tablet 2.5 mg  2.5 mg Oral Q12H Vaughan Basta, MD   2.5 mg at 01/01/17 1058  . aspirin EC tablet 81 mg  81 mg Oral Daily Vaughan Basta, MD   81 mg at 01/01/17 1058  . diphenhydrAMINE (BENADRYL) capsule 25 mg  25 mg Oral QHS PRN Vaughan Basta, MD      . docusate sodium (COLACE) capsule 100 mg  100 mg Oral BID PRN Vaughan Basta, MD      . ertapenem [facility]) 1 g in sodium chloride 0.9 % 50 mL IVPB  1 g Intravenous Q24H Vaughan Basta, MD      . feeding supplement (ENSURE ENLIVE) (ENSURE ENLIVE) liquid 237 mL  237 mL Oral TID BM Vaughan Basta, MD      . ipratropium-albuterol (DUONEB) 0.5-2.5 (3) MG/3ML nebulizer solution 3 mL  3 mL Nebulization Q4H Vaughan Basta, MD   3 mL at 01/01/17 1143  . levothyroxine (SYNTHROID, LEVOTHROID) tablet 100 mcg  100 mcg Oral QAC breakfast Vaughan Basta, MD   100 mcg at 01/01/17 1058  . oxyCODONE-acetaminophen (PERCOCET/ROXICET) 5-325 MG per tablet 1 tablet  1 tablet Oral Q6H PRN Vaughan Basta, MD      . pantoprazole (PROTONIX) EC tablet 40 mg  40 mg Oral Daily Vaughan Basta, MD   40 mg at 01/01/17 1058  . propranolol (INDERAL) tablet 30 mg  30 mg Oral BID Vaughan Basta, MD   30 mg at 01/01/17 1059  . traMADol (ULTRAM) tablet 50 mg  50 mg Oral Q12H PRN Vaughan Basta, MD      . traZODone (DESYREL) tablet 25 mg  25 mg Oral QHS Vaughan Basta, MD      . vancomycin (VANCOCIN) IVPB 1000 mg/200 mL premix  1,000 mg Intravenous Q24H Demetrios Loll, MD      . vitamin B-12 (CYANOCOBALAMIN) tablet 1,000 mcg  1,000 mcg Oral Daily Vaughan Basta, MD   1,000 mcg at 01/01/17 1058     Discharge Medications: Please see discharge summary for a list of discharge medications.  Relevant Imaging Results:  Relevant Lab Results:   Additional Information  SSN: [id number]  [employer], Veronia Beets, LCSW

## 2017-01-01 NOTE — Care Management Note (Addendum)
Case Management Note  Patient Details  Name: Evelyn [name] MRN: [id number] Date of Birth: October 19, 1919  Subjective/Objective:  Patient active with [facility]. Flo Shanks with hospice of [place] notified of admission.                   Action/Plan:   Expected Discharge Date:  01/02/17               Expected Discharge Plan:     In-House Referral:     Discharge planning Services  CM Consult  Post Acute Care Choice:    Choice offered to:     DME Arranged:    DME Agency:     HH Arranged:    HH Agency:     Status of Service:     If discussed at [employer], dates discussed:    Additional Comments:  Jolly Mango, RN 01/01/2017, 12:10 PM

## 2017-01-01 NOTE — Progress Notes (Signed)
Pharmacy Antibiotic Note  Evelyn [name] is a 83 y.o. female admitted on 12/31/2016 with bacteremia.  Pharmacy has been consulted for Vancomycin dosing.  6/4: Lab Called BCID information to Evelyn [name] RPh:  1 aerobic bottle positive for Gram Positive Cocci, Mec A positive. BCID not entered in [facility] yet.  Plan: BCID information discussed with MD. Patient with elevated WBC, appears clinically sick therefore MD would like to add Vancomycin and follow-up Blood cx results. Patient received Vancomycin 1 gram IV x 1 in ER on 12/31/16 at 1731. Will begin Vancomycin 1 gram IV Q24h. Ke 0.036,  T1/2  19.25  Vd 41.3   Will schedule trough prior to 4th dose on 6/6. F/u Blood cx results to see if contamination or not.    Height: 5\' 6"  (167.6 cm) Weight: 130 lb (59 kg) IBW/kg (Calculated) : 59.3  Temp (24hrs), Avg:98.9 F (37.2 C), Min:98.4 F (36.9 C), Max:99.3 F (37.4 C)   Recent Labs Lab 12/31/16 1331 12/31/16 1727 01/01/17 0443 01/01/17 0900  WBC 14.6*  --  26.7*  --   CREATININE 0.91  --  0.84  --   LATICACIDVEN 2.7* 2.5*  --  1.9    Estimated Creatinine Clearance: 38.1 mL/min (by C-G formula based on SCr of 0.84 mg/dL).    Allergies  Allergen Reactions  . Macrolides And Ketolides Other (See Comments)  . Oxycodone Nausea Only  . Ciprofloxacin Swelling, Rash and Other (See Comments)  . Neomy-Bacit-Polymyx-Pramoxine Rash    "All antibiotics"  . Other Rash    "All antibiotics"  . Sulfa Antibiotics Swelling, Rash and Other (See Comments)    Antimicrobials this admission: Ertapenem 6/3 >>   Vanc 6/3 >>    Dose adjustments this admission:    Microbiology results: 6/4 BCx: 1 of 4 bottles positive for GPC, MecA +  6/3 UCx: pending    Sputum:    6/3 MRSA PCR: negative  Thank you for allowing pharmacy to be a part of this patient's care.  Evelyn [name] A 01/01/2017 2:07 PM

## 2017-01-01 NOTE — Progress Notes (Signed)
[place] [facility] [place] NAME: Nylene Inlow    MR#:  [id number]  DATE OF BIRTH:  [birth year]  SUBJECTIVE:  CHIEF COMPLAINT:   Chief Complaint  Patient presents with  . Fall   Unable to move right leg per pt's son. REVIEW OF SYSTEMS:  Review of Systems  Unable to perform ROS: Dementia  Patient have dementia and she is hard of hearing so does not give me any review of system.  DRUG ALLERGIES:   Allergies  Allergen Reactions  . Macrolides And Ketolides Other (See Comments)  . Oxycodone Nausea Only  . Ciprofloxacin Swelling, Rash and Other (See Comments)  . Neomy-Bacit-Polymyx-Pramoxine Rash    "All antibiotics"  . Other Rash    "All antibiotics"  . Sulfa Antibiotics Swelling, Rash and Other (See Comments)   VITALS:  Blood pressure 113/75, pulse (!) 129, temperature 98.4 F (36.9 C), temperature source Oral, resp. rate 18, height 5\' 6"  (1.676 m), weight 130 lb (59 kg), SpO2 100 %. PHYSICAL EXAMINATION:  Physical Exam  Constitutional: She is well-developed, well-nourished, and in no distress.  HENT:  Head: Normocephalic.  Eyes: Conjunctivae and EOM are normal. No scleral icterus.  Neck: Normal range of motion. Neck supple. No JVD present. No tracheal deviation present.  Cardiovascular: Normal rate, regular rhythm and normal heart sounds.  Exam reveals no gallop.   No murmur heard. Pulmonary/Chest: Effort normal and breath sounds normal. No respiratory distress. She has no wheezes. She has no rales.  Abdominal: Soft. Bowel sounds are normal. She exhibits no distension. There is no tenderness.  Musculoskeletal: Normal range of motion. She exhibits no edema or tenderness.  Neurological:  Unable to exam  Skin: No rash noted. No erythema.  Sacral DU stage 2  Psychiatric:  Demented.   LABORATORY PANEL:  Female CBC  Recent Labs Lab 01/01/17 0443  WBC 26.7*  HGB 9.8*  HCT 30.3*  PLT 466*    ------------------------------------------------------------------------------------------------------------------ Chemistries   Recent Labs Lab 12/31/16 1331 01/01/17 0443  NA 138 143  K 4.3 3.6  CL 102 109  CO2 26 25  GLUCOSE 128* 109*  BUN 20 19  CREATININE 0.91 0.84  CALCIUM 9.1 8.3*  AST 24  --   ALT 13*  --   ALKPHOS 149*  --   BILITOT 0.6  --    RADIOLOGY:  Ct Head Wo Contrast  Result Date: 12/31/2016 CLINICAL DATA:  Found down.  Decreased responsiveness. EXAM: CT HEAD WITHOUT CONTRAST TECHNIQUE: Contiguous axial images were obtained from the base of the skull through the vertex without intravenous contrast. COMPARISON:  05/05/2015 FINDINGS: Brain: No evidence of acute infarction, hemorrhage, hydrocephalus, extra-axial collection or mass lesion/mass effect. Advanced chronic small vessel ischemia with gliosis confluent throughout the cerebral white matter. Remote lacunar infarct at the genu of the right internal capsule and in the left thalamus. Clustered remote infarcts in the right cerebellum. Asymmetric frontal horns is chronic and likely from coaptation on the right. Mild for age generalized volume loss. Vascular: Atherosclerotic calcification. Skull: No acute finding. Sinuses/Orbits: Chronic sinus opacification, progressed from prior in the hypoplastic left frontal sinus. IMPRESSION: 1. No acute finding or change from prior. 2. Advanced chronic small vessel disease. Electronically Signed   By: Monte Fantasia M.D.   On: 12/31/2016 15:15   Ct Chest Wo Contrast  Result Date: 12/31/2016 CLINICAL DATA:  Unwitnessed fall. Nonverbal and bedbound. History of lymphoma and skin cancer. EXAM: CT CHEST, ABDOMEN AND PELVIS WITHOUT  CONTRAST TECHNIQUE: Multidetector CT imaging of the chest, abdomen and pelvis was performed following the standard protocol without IV contrast. COMPARISON:  12/04/2013 FINDINGS: CT CHEST FINDINGS Cardiovascular: Heart size normal. Coronary artery calcification  is noted. Atherosclerotic calcification is noted in the wall of the thoracic aorta. Mediastinum/Nodes: No mediastinal lymphadenopathy. No evidence for gross hilar lymphadenopathy although assessment is limited by the lack of intravenous contrast on today's study. Tiny hiatal hernia noted. The esophagus has normal imaging features. There is no axillary lymphadenopathy. Lungs/Pleura: Patchy central airspace disease is identified in the right upper lobe, right middle lobe, and right lower lobe. There is some mild central airspace disease in the superior segment of the left lower lobe. No pulmonary edema or pleural effusion. Musculoskeletal: Bone windows reveal no worrisome lytic or sclerotic osseous lesions. CT ABDOMEN PELVIS FINDINGS Hepatobiliary: No focal abnormality in the liver on this study without intravenous contrast. Layering gallstones are evident. No intrahepatic or extrahepatic biliary dilation. Pancreas: No focal mass lesion. No dilatation of the main duct. No intraparenchymal cyst. No peripancreatic edema. Spleen: No splenomegaly. No focal mass lesion. Adrenals/Urinary Tract: Similar appearance of bilateral renal scarring. No hydronephrosis. Similar mild diffuse prominence of the right ureter. Left ureter unremarkable. The urinary bladder appears normal for the degree of distention. Stomach/Bowel: Status post distal gastrectomy with gastrojejunostomy. No small bowel wall thickening. No small bowel dilatation. Diverticular changes are noted in the left colon without evidence of diverticulitis. Vascular/Lymphatic: There is abdominal aortic atherosclerosis without aneurysm. There is no gastrohepatic or hepatoduodenal ligament lymphadenopathy. No intraperitoneal or retroperitoneal lymphadenopathy. No pelvic sidewall lymphadenopathy. Reproductive: Uterus is surgically absent. No right adnexal mass. 4.6 cm cystic lesion identified in the posterior left adnexal space. This has been present since at least  06/04/2009 and I remeasure it at 4.3 cm on that exam. Other: No intraperitoneal free fluid. Musculoskeletal: Comminuted fracture noted right superior pubic ramus with acute appearance. Bones are diffusely demineralized. IMPRESSION: 1. Asymmetric airspace disease, right greater than left suggesting pneumonia. Asymmetric pulmonary edema and pulmonary hemorrhage considered less likely possibilities. 2. Comminuted fracture right superior pubic ramus appears to be acute. 3. Status post distal gastrectomy. 4. Four point cm cystic lesion left stomach comet pelvis present since at least 06/04/2009 consistent with benign process. Electronically Signed   By: Misty Stanley M.D.   On: 12/31/2016 15:45   Dg Chest Port 1 View  Result Date: 12/31/2016 CLINICAL DATA:  Status post fall today.  Patient found down. EXAM: PORTABLE CHEST 1 VIEW COMPARISON:  PA and lateral chest 11/09/2016 and 07/13/2016. FINDINGS: The patient is rotated on the exam. Lungs are clear. Heart size is normal. No pneumothorax or pleural fluid. No acute bony abnormality. Atherosclerosis noted. IMPRESSION: No acute disease. Atherosclerosis. Electronically Signed   By: Inge Rise M.D.   On: 12/31/2016 13:42   Ct Renal Stone Study  Result Date: 12/31/2016 CLINICAL DATA:  Unwitnessed fall. Nonverbal and bedbound. History of lymphoma and skin cancer. EXAM: CT CHEST, ABDOMEN AND PELVIS WITHOUT CONTRAST TECHNIQUE: Multidetector CT imaging of the chest, abdomen and pelvis was performed following the standard protocol without IV contrast. COMPARISON:  12/04/2013 FINDINGS: CT CHEST FINDINGS Cardiovascular: Heart size normal. Coronary artery calcification is noted. Atherosclerotic calcification is noted in the wall of the thoracic aorta. Mediastinum/Nodes: No mediastinal lymphadenopathy. No evidence for gross hilar lymphadenopathy although assessment is limited by the lack of intravenous contrast on today's study. Tiny hiatal hernia noted. The esophagus has  normal imaging features. There is no axillary lymphadenopathy.  Lungs/Pleura: Patchy central airspace disease is identified in the right upper lobe, right middle lobe, and right lower lobe. There is some mild central airspace disease in the superior segment of the left lower lobe. No pulmonary edema or pleural effusion. Musculoskeletal: Bone windows reveal no worrisome lytic or sclerotic osseous lesions. CT ABDOMEN PELVIS FINDINGS Hepatobiliary: No focal abnormality in the liver on this study without intravenous contrast. Layering gallstones are evident. No intrahepatic or extrahepatic biliary dilation. Pancreas: No focal mass lesion. No dilatation of the main duct. No intraparenchymal cyst. No peripancreatic edema. Spleen: No splenomegaly. No focal mass lesion. Adrenals/Urinary Tract: Similar appearance of bilateral renal scarring. No hydronephrosis. Similar mild diffuse prominence of the right ureter. Left ureter unremarkable. The urinary bladder appears normal for the degree of distention. Stomach/Bowel: Status post distal gastrectomy with gastrojejunostomy. No small bowel wall thickening. No small bowel dilatation. Diverticular changes are noted in the left colon without evidence of diverticulitis. Vascular/Lymphatic: There is abdominal aortic atherosclerosis without aneurysm. There is no gastrohepatic or hepatoduodenal ligament lymphadenopathy. No intraperitoneal or retroperitoneal lymphadenopathy. No pelvic sidewall lymphadenopathy. Reproductive: Uterus is surgically absent. No right adnexal mass. 4.6 cm cystic lesion identified in the posterior left adnexal space. This has been present since at least 06/04/2009 and I remeasure it at 4.3 cm on that exam. Other: No intraperitoneal free fluid. Musculoskeletal: Comminuted fracture noted right superior pubic ramus with acute appearance. Bones are diffusely demineralized. IMPRESSION: 1. Asymmetric airspace disease, right greater than left suggesting pneumonia.  Asymmetric pulmonary edema and pulmonary hemorrhage considered less likely possibilities. 2. Comminuted fracture right superior pubic ramus appears to be acute. 3. Status post distal gastrectomy. 4. Four point cm cystic lesion left stomach comet pelvis present since at least 06/04/2009 consistent with benign process. Electronically Signed   By: Misty Stanley M.D.   On: 12/31/2016 15:45   ASSESSMENT AND PLAN:   * UTI and pneumonia.   Recent UTI with ESBL. Continue ertapenem, f/u U/C and CBC.  * Sepsis   Evident by tachycardia, tachypnea, elevated white blood cell count.   Secondary to UTI and pneumonia, given IV fluid and monitor.   Antibiotic as above.  Lactic acidosis. Improved.  * Dysphagia Dysphagia 1 diet per speech and swallow evaluation.    * Hypertension   Continue home medications.  * Hypothyroidism   Continue levothyroxine.  Pelvic fracture. Pain control.  Sacral DU stage 2.   All the records are reviewed and case discussed with Care Management/Social Worker. Management plans discussed with the patient, her son and daughter=-in-law and they are in agreement.  CODE STATUS: DNR  TOTAL TIME TAKING CARE OF THIS PATIENT: 43 minutes.   More than 50% of the time was spent in counseling/coordination of care: YES  POSSIBLE D/C IN 3 DAYS, DEPENDING ON CLINICAL CONDITION.   Demetrios Loll M.D on 01/01/2017 at 1:21 PM  Between 7am to 6pm - Pager - [phone]  After 6pm go to [email or web address] - Proofreader  [employer] [facility]  Office  [phone]  CC: Primary care physician; Crecencio Mc, MD  Note: This dictation was prepared with Dragon dictation along with smaller phrase technology. Any transcriptional errors that result from this process are unintentional.

## 2017-01-01 NOTE — Evaluation (Addendum)
Clinical/Bedside Swallow Evaluation Patient Details  Name: Evelyn [name] MRN: [id number] Date of Birth: 25-Jan-1920  Today's Date: 01/01/2017 Time: SLP Start Time (ACUTE ONLY): 0900 SLP Stop Time (ACUTE ONLY): 1000 SLP Time Calculation (min) (ACUTE ONLY): 60 min  Past Medical History:  Past Medical History:  Diagnosis Date  . Anemia    hx of blood transfusion  . Cancer ([place])    lymphomia  . Coarse tremors    head shakes if does not take propranolol  . Coronary artery disease   . GERD (gastroesophageal reflux disease)   . History of lymphoma   . Hyperlipidemia   . Hypertension   . Hypothyroidism   . Insomnia    takes remeron nightly  . Neuromuscular disorder ([place])   . Other chronic cystitis    Radiation , botox injections  . Self-catheterizes urinary bladder    twice daily  . Thyroid disease    had thyroid removed   Past Surgical History:  Past Surgical History:  Procedure Laterality Date  . ABDOMINAL HYSTERECTOMY  1970s  . APPENDECTOMY    . BACK SURGERY     lower back - bulging disk  . CARDIAC CATHETERIZATION  2005   2 cardiac stents  . CYSTOSCOPY  April 2013  . EYE SURGERY     bilateral cataracts  . JOINT REPLACEMENT  2012   right hip  . LUMBAR LAMINECTOMY/DECOMPRESSION MICRODISCECTOMY  01/24/2012   Procedure: LUMBAR LAMINECTOMY/DECOMPRESSION MICRODISCECTOMY 2 LEVELS;  Surgeon: Floyce Stakes, MD;  Location: [facility];  Service: Neurosurgery;  Laterality: Right;  Right Lumbar four five, lumbar five sacral one foraminotomy   . [place] SURGERY  2008   [place]  . STOMACH SURGERY     removal all but small portion of stomach out due to ulcers   HPI:  Pt is a 83 y.o. female w/ multiple medical issues baseline including a current fall w/ fracture(pelvis) who reportedly is minimally verbal at baseline due to chronic dementia and she is confined to her bed after several illnesses over the last few months.  She presents by EMS for evaluation after being found down on  the ground and having tachycardia in the 130s.  The fall was unwitnessed and the circumstances unclear.  The patient is not able to provide any history or review of systems.  She is ill-appearing but nontoxic upon arrival, and actually denied having any pain when I ask her but is not oriented to person, place, or situation. Pt has had a h/o dysphagia in the past few months per family report and placed on a dysphagia diet including thickened liquids at the NH. Family is unsure of dyspahgia details. Pt appears fatigued, weak.    Assessment / Plan / Recommendation Clinical Impression  Pt appears to present w/ pharyngeal phase dysphagia at this evaluation w/ increased risk for aspiration moreso of thin liquids. Pt exhibited overt, immediate coughing w/ trials of ice chips(thin liquid) x2/2 trials. NO overt s/s of aspiration were noted w/ trials of Nectar liquids via Cup which pt helped to hold to feed self; similar results were noted w/ trials of Puree. No gross oral phase deficits or weakness noted w/ bolus trial consistencies; pt cleared appropriately b/t trials and exhibited timely transfer for swallowing.  As pt exhibited the overt s/s of aspiration including coughing, sneezing immediately w/ trials of thin liquids(water), recommend a Nectar consistency liquid especially during meals. Pt and family and MD could consider pleasure ice chips/water given thorough oral care - IF  for quality and pleasure as pt is at increased risk for dysphagia/aspiration and pulmonary decline. Family given thorough education on swallowing, dysphagia, and aspiration precautions; oral care importance. Family agreed. ST services will f/u w/ pt's status while admitted.  SLP Visit Diagnosis: Dysphagia, pharyngeal phase (R13.13)    Aspiration Risk  Mild aspiration risk;Moderate aspiration risk    Diet Recommendation  Dysphagia level 1 w/ Nectar liquids; aspiration precautions; feeding support at meals.  Medication Administration:  Crushed with puree    Other  Recommendations Recommended Consults:  (Dietician f/u) Oral Care Recommendations: Oral care BID;Staff/trained caregiver to provide oral care Other Recommendations: Order thickener from pharmacy;Prohibited food (jello, ice cream, thin soups);Remove water pitcher;Have oral suction available   Follow up Recommendations Skilled Nursing facility      Frequency and Duration min 3x week  2 weeks       Prognosis Prognosis for Safe Diet Advancement: Guarded Barriers to Reach Goals: Cognitive deficits;Severity of deficits      Swallow Study   General Date of Onset: 12/31/16 HPI: Pt is a 83 y.o. female w/ multiple medical issues baseline including a current fall w/ fracture(pelvis) who reportedly is minimally verbal at baseline due to chronic dementia and she is confined to her bed after several illnesses over the last few months.  She presents by EMS for evaluation after being found down on the ground and having tachycardia in the 130s.  The fall was unwitnessed and the circumstances unclear.  The patient is not able to provide any history or review of systems.  She is ill-appearing but nontoxic upon arrival, and actually denied having any pain when I ask her but is not oriented to person, place, or situation. Pt has had a h/o dysphagia in the past few months per family report and placed on a dysphagia diet including thickened liquids at the NH. Family is unsure of dyspahgia details. Pt appears fatigued, weak.  Type of Study: Bedside Swallow Evaluation Previous Swallow Assessment: performed at NH per family report Diet Prior to this Study: Dysphagia 3 (soft);Dysphagia 1 (puree);Thin liquids;Nectar-thick liquids (all of the above recently) Temperature Spikes Noted: No (wbc 26.7 increased from admission) Respiratory Status: Nasal cannula (3 liters) History of Recent Intubation: No Behavior/Cognition: Alert;Cooperative;Pleasant mood;Confused;Distractible;Requires cueing  (min drowsy toward end of session) Oral Cavity Assessment: Dry (min) Oral Care Completed by SLP: Recent completion by staff Oral Cavity - Dentition: Adequate natural dentition (missing few molars) Vision: Functional for self-feeding Self-Feeding Abilities: Able to feed self;Needs assist;Needs set up;Total assist Patient Positioning: Upright in bed Baseline Vocal Quality:  (nonverbal w/ SLP) Volitional Cough: Congested;Strong (nonvolitional) Volitional Swallow: Unable to elicit    Oral/Motor/Sensory Function Overall Oral Motor/Sensory Function: Within functional limits (w/ bolus management and clearing)   Ice Chips Ice chips: Impaired Presentation: Spoon (fed; 2 trials of single chips) Oral Phase Impairments:  (none) Oral Phase Functional Implications:  (none) Pharyngeal Phase Impairments: Suspected delayed Swallow;Cough - Immediate (x2/2 trials)   Thin Liquid Thin Liquid: Not tested    Nectar Thick Nectar Thick Liquid: Within functional limits Presentation: Cup;Self Fed;Spoon (~3 ozs total) Other Comments: no overt s/s of aspiration noted   Honey Thick Honey Thick Liquid: Not tested   Puree Puree: Within functional limits Presentation: Spoon (fed; 8 trials)   Solid   GO   Solid: Not tested Other Comments: will continue to assess         Orinda Kenner, MS, CCC-SLP [place] Caet[place] 01/01/2017,11:53 AM

## 2017-01-01 NOTE — Progress Notes (Addendum)
Patient is A&O X4, really HOH. On isolation for ESBL. Tele in place, running a.fib 80-110. Dys 1 nectar thick liquid diet, per ST. Right arm and right leg, extreme weakness and unable to move, MD aware. Total assist with cares. Incont of urine. Meds crushed in applesause. On low bed, alarm in place. IV fluids running. Family in room most of the day. Oxygen decreased to 1L. Stool this shift.

## 2017-01-01 NOTE — Progress Notes (Signed)
Visit made. Patient is currently followed by Hospice and [place] at [facility] with a hospice diagnosis of Severe protein calorie malnutrition. She is a DNR code. Patient was sent to [facility] via EMS following a fall at the facility. Hospice was not notified prior to transport. She has been admitted for treatment of a UTI. CT in the ED revealed a pelvic fracture as well as pnuemonia. She is currently receiving IV antibiotics and is requiring supplemental oxygen. She has not required any pain medication. Patient seen lying in bed, alert, son [name], his wife and niece present. Patient alert and did answer simple questions. Requested water, which was given, nectar thick as ordered, with head of bed elevated. Some delayed cough noted. Writer explained her role to family and patient. Chart notes reviewed, discussed with staff RN  Roselyn Reef. No discharge date at this time. Will continue to follow and update hospice team. Hospital notes faxed to triage. Hospital care team made aware patient is currently receiving hospice care. Thank you. Flo Shanks RN, BSN, [place] and [facility] [place], [facility] [phone] c

## 2017-01-02 DIAGNOSIS — J690 Pneumonitis due to inhalation of food and vomit: Secondary | ICD-10-CM

## 2017-01-02 DIAGNOSIS — R319 Hematuria, unspecified: Secondary | ICD-10-CM

## 2017-01-02 DIAGNOSIS — N390 Urinary tract infection, site not specified: Secondary | ICD-10-CM

## 2017-01-02 DIAGNOSIS — Z7189 Other specified counseling: Secondary | ICD-10-CM

## 2017-01-02 DIAGNOSIS — Z515 Encounter for palliative care: Secondary | ICD-10-CM

## 2017-01-02 LAB — BLOOD CULTURE ID PANEL (REFLEXED)
Acinetobacter baumannii: NOT DETECTED
CANDIDA ALBICANS: NOT DETECTED
CANDIDA GLABRATA: NOT DETECTED
CANDIDA KRUSEI: NOT DETECTED
CANDIDA PARAPSILOSIS: NOT DETECTED
CANDIDA TROPICALIS: NOT DETECTED
ENTEROBACTER CLOACAE COMPLEX: NOT DETECTED
ENTEROBACTERIACEAE SPECIES: NOT DETECTED
ESCHERICHIA COLI: NOT DETECTED
Enterococcus species: NOT DETECTED
Haemophilus influenzae: NOT DETECTED
KLEBSIELLA OXYTOCA: NOT DETECTED
KLEBSIELLA PNEUMONIAE: NOT DETECTED
Listeria monocytogenes: NOT DETECTED
Methicillin resistance: DETECTED — AB
Neisseria meningitidis: NOT DETECTED
PROTEUS SPECIES: NOT DETECTED
Pseudomonas aeruginosa: NOT DETECTED
STAPHYLOCOCCUS SPECIES: DETECTED — AB
STREPTOCOCCUS PYOGENES: NOT DETECTED
Serratia marcescens: NOT DETECTED
Staphylococcus aureus (BCID): NOT DETECTED
Streptococcus agalactiae: NOT DETECTED
Streptococcus pneumoniae: NOT DETECTED
Streptococcus species: NOT DETECTED

## 2017-01-02 LAB — CBC
HCT: 26 % — ABNORMAL LOW (ref 35.0–47.0)
Hemoglobin: 8.4 g/dL — ABNORMAL LOW (ref 12.0–16.0)
MCH: 27.6 pg (ref 26.0–34.0)
MCHC: 32.2 g/dL (ref 32.0–36.0)
MCV: 85.5 fL (ref 80.0–100.0)
PLATELETS: 385 10*3/uL (ref 150–440)
RBC: 3.04 MIL/uL — AB (ref 3.80–5.20)
RDW: 20.1 % — AB (ref 11.5–14.5)
WBC: 17.8 10*3/uL — AB (ref 3.6–11.0)

## 2017-01-02 LAB — CREATININE, SERUM: CREATININE: 0.7 mg/dL (ref 0.44–1.00)

## 2017-01-02 MED ORDER — MORPHINE SULFATE (CONCENTRATE) 10 MG/0.5ML PO SOLN: 5.0000 mg | ORAL | Status: DC | PRN

## 2017-01-02 NOTE — Progress Notes (Signed)
Patient resting in bed with family at bedside. Patient consumed majority of am meal with noticed delayed cough response and scant amounts of emesis periodically possibly related to cough and gag reflex. Patient with persistent intermittent cough this shift. Given tramadol for pain towards end of shift r/t patient guarding right chest with cough. HR 71 at this time. O2 saturations at 91% on 1 LPM increased to 2 Lpm for comfort. Lung sounds diminished to right upper lobe with scattered rhonchi. Family updated. IVBTs administered this shift.

## 2017-01-02 NOTE — Progress Notes (Signed)
Initial Nutrition Assessment  DOCUMENTATION CODES:   Severe malnutrition in context of chronic illness  INTERVENTION:  1. Continue Ensure Enlive po TID, each supplement provides 350 kcal and 20 grams of protein  NUTRITION DIAGNOSIS:   Malnutrition (Severe) related to chronic illness (Recurrent UTIs) as evidenced by severe depletion of muscle mass, severe depletion of body fat.  GOAL:   Patient will meet greater than or equal to 90% of their needs  MONITOR:   PO intake, I & O's, Labs, Supplement acceptance  REASON FOR ASSESSMENT:   Low Braden    ASSESSMENT:   83 yo woman with Dementia, UTI, PNA, Sepsis, Dysphagia  Spoke with Ms. Klebba Son at bedside, patient is demented, unable to provide history. Was recently admitted back in April with UTI, was not eating much at that time but was started on Ensure. Appears weight has remained stable. Patient has been consuming Ensure, not eating much food, but son unable to provide an idea of her usual intake. Currently on NDD1 Nectar-Thick per SLP  -seems to be tolerating Nutrition-Focused physical exam completed. Findings are severe fat depletion, severe muscle depletion, and no edema.  Patient began vomiting during my visit - and was thus cut short. Labs and medications reviewed: BNP elevated, B12 NS @ 46mL/hr  Diet Order:  DIET - DYS 1 Room service appropriate? Yes with Assist; Fluid consistency: Nectar Thick  Skin:  Wound (see comment) (Stg II to Buttocks)  Last BM:  12/31/2016  Height:   Ht Readings from Last 1 Encounters:  12/31/16 5\' 6"  (1.676 m)    Weight:   Wt Readings from Last 1 Encounters:  12/31/16 130 lb (59 kg)    Ideal Body Weight:  59.09 kg  BMI:  Body mass index is 20.98 kg/m.  Estimated Nutritional Needs:   Kcal:  1600-1900 calories  Protein:  70-88 grams (1.2-1.5g/kg)  Fluid:  1.8 - 2.1 L (30-35 ml/kg)  EDUCATION NEEDS:   Education needs no appropriate at this time  Satira Anis. Cregg Jutte,  MS, RD LDN Inpatient Clinical Dietitian Pager [phone]

## 2017-01-02 NOTE — Care Management (Addendum)
Barrier to discharge: Spoke with Dr. Bridgett Larsson. It is anticipated discharge will be in 1-2 days depending on blood cultures and needed abx. Patient will discharge to [facility] with Hospice services.

## 2017-01-02 NOTE — Progress Notes (Signed)
[facility] [place] NAME: Evelyn [name]    MR#:  [id number]  DATE OF BIRTH:  [birth year]  SUBJECTIVE:  CHIEF COMPLAINT:   Chief Complaint  Patient presents with  . Fall   The patient is demented, noncommunicative. REVIEW OF SYSTEMS:  Review of Systems  Unable to perform ROS: Dementia  Patient have dementia and she is hard of hearing so does not give me any review of system.  DRUG ALLERGIES:   Allergies  Allergen Reactions  . Macrolides And Ketolides Other (See Comments)  . Oxycodone Nausea Only  . Ciprofloxacin Swelling, Rash and Other (See Comments)  . Neomy-Bacit-Polymyx-Pramoxine Rash    "All antibiotics"  . Other Rash    "All antibiotics"  . Sulfa Antibiotics Swelling, Rash and Other (See Comments)   VITALS:  Blood pressure 122/62, pulse (!) 127, temperature 98 F (36.7 C), temperature source Oral, resp. rate 20, height 5\' 6"  (1.676 m), weight 130 lb (59 kg), SpO2 92 %. PHYSICAL EXAMINATION:  Physical Exam  Constitutional: She is well-developed, well-nourished, and in no distress.  HENT:  Head: Normocephalic.  Eyes: Conjunctivae and EOM are normal. No scleral icterus.  Neck: Normal range of motion. Neck supple. No JVD present. No tracheal deviation present.  Cardiovascular: Normal rate, regular rhythm and normal heart sounds.  Exam reveals no gallop.   No murmur heard. Pulmonary/Chest: Effort normal and breath sounds normal. No respiratory distress. She has no wheezes. She has no rales.  Abdominal: Soft. Bowel sounds are normal. She exhibits no distension. There is no tenderness.  Musculoskeletal: Normal range of motion. She exhibits no edema or tenderness.  Neurological:  Unable to exam  Skin: No rash noted. No erythema.  Sacral DU stage 2  Psychiatric:  Demented.   LABORATORY PANEL:  Female CBC  Recent Labs Lab 01/02/17 1152  WBC 17.8*  HGB 8.4*  HCT 26.0*  PLT 385    ------------------------------------------------------------------------------------------------------------------ Chemistries   Recent Labs Lab 12/31/16 1331 01/01/17 0443 01/02/17 0737  NA 138 143  --   K 4.3 3.6  --   CL 102 109  --   CO2 26 25  --   GLUCOSE 128* 109*  --   BUN 20 19  --   CREATININE 0.91 0.84 0.70  CALCIUM 9.1 8.3*  --   AST 24  --   --   ALT 13*  --   --   ALKPHOS 149*  --   --   BILITOT 0.6  --   --    RADIOLOGY:  Dg Hip Unilat With Pelvis 2-3 Views Right  Result Date: 01/01/2017 CLINICAL DATA:  Fall. EXAM: DG HIP (WITH OR WITHOUT PELVIS) 2-3V RIGHT COMPARISON:  08/01/1958 1,016 . FINDINGS: Prior ORIF right hip. Hardware intact. Diffuse osteopenia degenerative change. Anatomic alignment. Iliofemoral stents are noted on the right. Aortoiliac atherosclerotic vascular calcification. Peripheral vascular calcification. Stool noted throughout the colon. Air-filled loops small large bowel noted. Mild adynamic ileus cannot be excluded. IMPRESSION: 1.  ORIF right hip.  No acute bony abnormality.  Normal alignment . 2. Aortoiliac atherosclerotic vascular disease. Right iliofemoral stent noted Electronically Signed   By: Marcello Moores  Register   On: 01/01/2017 14:48   ASSESSMENT AND PLAN:   * UTI and pneumonia.   Recent UTI with ESBL. Continue ertapenem, f/u U/C (GNR) and CBC.  * Sepsis   Evident by tachycardia, tachypnea, elevated white blood cell count.   Secondary to UTI and pneumonia, given IV  fluid and monitor. Blood cultures: MRSA. Started vancomycin. Follow-up blood culture. Continue ertapenem..  Lactic acidosis. Improved.  * Dysphagia Dysphagia 1 diet per speech and swallow evaluation.    * Hypertension   Continue home medications.  * Hypothyroidism   Continue levothyroxine.  Pelvic fracture. Pain control.  Sacral DU stage 2.   All the records are reviewed and case discussed with Care Management/Social Worker. Management plans discussed with  the patient, her son and daughter=-in-law and they are in agreement.  CODE STATUS: DNR  TOTAL TIME TAKING CARE OF THIS PATIENT: 36 minutes.   More than 50% of the time was spent in counseling/coordination of care: YES  POSSIBLE D/C IN 2 DAYS, DEPENDING ON CLINICAL CONDITION.   Demetrios Loll M.D on 01/02/2017 at 12:57 PM  Between 7am to 6pm - Pager - [phone]  After 6pm go to [email or web address] - Proofreader  [facility]  Office  [phone]  CC: Primary care physician; Crecencio Mc, MD  Note: This dictation was prepared with Dragon dictation along with smaller phrase technology. Any transcriptional errors that result from this process are unintentional.

## 2017-01-02 NOTE — Consult Note (Signed)
Consultation Note Date: 01/02/2017   Patient Name: Evelyn [name]  DOB: 1920-05-12  MRN: [id number]  Age / Sex: 83 y.o., female  PCP: Crecencio Mc, MD Referring Physician: Demetrios Loll, MD  Reason for Consultation: Establishing goals of care  HPI/Patient Profile: 83 y.o. female  with past medical history of recurrent UTI, chronic cystitis, h/o lymphoma, anemia, lymphoma, CAD, GERD, HTN, HLD, hypothyroidism (thyroid removed), insomnia admitted on 12/31/2016 with unwitnessed fall and found to have UTI and likely aspiration pneumonia. Has had hospice at ALF but hospice was not notified prior to admission.   Clinical Assessment and Goals of Care: I met today at [name]'s bedside with son, [name], and hospice RN liaison, Santiago Glad. They are known to me from last admission in which I had many conversations with [name] mostly but other family as well. We discussed poor prognosis and hospice at that time. She subsequently d/c to SNF and then was able to return to [facility] with hospice following.   [name] says that she has not really been doing any better. He recognizes that she continues to decline and "she's just tired." Unfortunately he was not given the option for her to be transferred to the hospital. When we discussed if he would want her readmitted in the future vs kept comfortable in hospice care and he tells me "kept comfortable." He seems to have good understanding that she is at EOL. Emotional support provided.   We did NOT discuss today but if she has a true bacteremia and not a contaminant I doubt family would want PICC/IV antibiotics at this point. If they wanted treatment it would be good to check with ID for any po options so she can continue to receive hospice care. I will follow up tomorrow regarding this issue.   Primary Decision Maker NEXT OF KIN son [name] is primary surrogate although not documented  HCPOA and he wishes to make decisions with his other 2 brothers     SUMMARY OF RECOMMENDATIONS   - Goal is to return back to [facility] with hospice - Will need to further discuss options if treatment desire for bacteremia  Code Status/Advance Care Planning:  DNR   Symptom Management:   Gurgling/secretions: Consider robinul 0.2 mg IV every 4 hours prn if needed.   Dyspnea: Roxanol 5 mg every 2 hours prn.   Palliative Prophylaxis:   Aspiration, Bowel Regimen, Delirium Protocol, Frequent Pain Assessment, Oral Care and Turn Reposition  Additional Recommendations (Limitations, Scope, Preferences):  Avoid Hospitalization and Minimize Medications  Psycho-social/Spiritual:   Desire for further Chaplaincy support:no  Additional Recommendations: Caregiving  Support/Resources, Education on Hospice and Grief/Bereavement Support  Prognosis:   Could be weeks or less at this stage  Discharge Planning: Return to [facility] with continued hospice support     Primary Diagnoses: Present on Admission: . UTI (urinary tract infection) . Sepsis ([place])   I have reviewed the medical record, interviewed the patient and family, and examined the patient. The following aspects are pertinent.  Past  Medical History:  Diagnosis Date  . Anemia    hx of blood transfusion  . Cancer ([place])    lymphomia  . Coarse tremors    head shakes if does not take propranolol  . Coronary artery disease   . GERD (gastroesophageal reflux disease)   . History of lymphoma   . Hyperlipidemia   . Hypertension   . Hypothyroidism   . Insomnia    takes remeron nightly  . Neuromuscular disorder ([place])   . Other chronic cystitis    Radiation , botox injections  . Self-catheterizes urinary bladder    twice daily  . Thyroid disease    had thyroid removed   Social History   Social History  . Marital status: Widowed    Spouse name: N/A  . Number of children: N/A  . Years of education: N/A   Social  History Main Topics  . Smoking status: Former Smoker    Quit date: 04/04/1971  . Smokeless tobacco: Never Used  . Alcohol use No  . Drug use: No  . Sexual activity: Not Asked   Other Topics Concern  . None   Social History Narrative  . None   Family History  Problem Relation Age of Onset  . Diabetes Mother   . Heart disease Father   . Kidney disease Neg Hx   . Bladder Cancer Neg Hx    Scheduled Meds: . acetaminophen  650 mg Oral BID  . acidophilus  1 capsule Oral Daily  . amLODipine  10 mg Oral Daily  . apixaban  2.5 mg Oral Q12H  . aspirin EC  81 mg Oral Daily  . feeding supplement (ENSURE ENLIVE)  237 mL Oral TID BM  . ipratropium-albuterol  3 mL Nebulization Q6H  . levothyroxine  100 mcg Oral QAC breakfast  . pantoprazole  40 mg Oral Daily  . propranolol  30 mg Oral BID  . traZODone  25 mg Oral QHS  . vitamin B-12  1,000 mcg Oral Daily   Continuous Infusions: . sodium chloride 75 mL/hr at 01/02/17 1042  . ertapenem Stopped (01/01/17 1812)  . vancomycin 1,000 mg (01/02/17 1507)   PRN Meds:.diphenhydrAMINE, docusate sodium, oxyCODONE-acetaminophen, traMADol Allergies  Allergen Reactions  . Macrolides And Ketolides Other (See Comments)  . Oxycodone Nausea Only  . Ciprofloxacin Swelling, Rash and Other (See Comments)  . Neomy-Bacit-Polymyx-Pramoxine Rash    "All antibiotics"  . Other Rash    "All antibiotics"  . Sulfa Antibiotics Swelling, Rash and Other (See Comments)   Review of Systems  Unable to perform ROS   Physical Exam  Constitutional: She appears well-developed. She appears lethargic. She has a sickly appearance.  Thin, frail, elderly  HENT:  Head: Normocephalic and atraumatic.  Cardiovascular: Tachycardia present.   Pulmonary/Chest: No accessory muscle usage. No tachypnea. No respiratory distress. She has rhonchi.  Audible gurgling from bedside  Abdominal: Normal appearance.  Neurological: She appears lethargic. She is disoriented.  Nursing  note and vitals reviewed.   Vital Signs: BP 122/62 (BP Location: Right Arm)   Pulse (!) 127   Temp 98 F (36.7 C) (Oral)   Resp 20   Ht _0  (1.676 m)   Wt 59 kg (130 lb)   SpO2 92%   BMI 20.98 kg/m  Pain Assessment: PAINAD POSS *See Group Information*: 1-Acceptable,Awake and alert Pain Score: Asleep   SpO2: SpO2: 92 % O2 Device:SpO2: 92 % O2 Flow Rate: .O2 Flow Rate (L/min): 1 L/min  IO: Intake/output summary: No  intake or output data in the 24 hours ending 01/02/17 1534  LBM: Last BM Date: 12/31/16 Baseline Weight: Weight: 59 kg (130 lb) Most recent weight: Weight: 59 kg (130 lb)     Palliative Assessment/Data:   Flowsheet Rows     Most Recent Value  Intake Tab  Referral Department  Hospitalist  Unit at Time of Referral  Orthopedic Unit  Palliative Care Primary Diagnosis  Sepsis/Infectious Disease  Date Notified  12/31/16  Palliative Care Type  Return patient Palliative Care  Reason for referral  Clarify Goals of Care, Counsel Regarding Hospice  Date of Admission  12/31/16  # of days IP prior to Palliative referral  0  Clinical Assessment  Psychosocial & Spiritual Assessment  Palliative Care Outcomes       Time Total: 66mn  Greater than 50%  of this time was spent counseling and coordinating care related to the above assessment and plan.  Signed by: AVinie Sill NP Palliative Medicine Team Pager # [phone](M-F 8a-5p) Team Phone # [phone](Nights/Weekends)

## 2017-01-02 NOTE — Progress Notes (Signed)
Visit made. Patient seen sitting up in bed alert, son [name] at bedside, Palliative NP Vinie Sill also present. RT in to administer a breathing treatment. Cough noted through out the visit, per report of staff RN Brandi, patient coughed quite a bit after breakfast this morning and did require suctioning. Danny expressed his feelings that "her body is give out and tired". 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 Patient fell asleep after her breathing treatment. She remains on IV antibiotics, IV fluids, oxygen and scheduled duoneb treatments. Will continue to follow and update hospice team.  Flo Shanks RN, BSN, [facility] and Palliative Care of [place],  hospital Liaison [phone] c

## 2017-01-03 DIAGNOSIS — Z515 Encounter for palliative care: Secondary | ICD-10-CM

## 2017-01-03 LAB — CBC
HEMATOCRIT: 28.6 % — AB (ref 35.0–47.0)
HEMOGLOBIN: 9.2 g/dL — AB (ref 12.0–16.0)
MCH: 27.6 pg (ref 26.0–34.0)
MCHC: 32.3 g/dL (ref 32.0–36.0)
MCV: 85.5 fL (ref 80.0–100.0)
Platelets: 434 10*3/uL (ref 150–440)
RBC: 3.34 MIL/uL — ABNORMAL LOW (ref 3.80–5.20)
RDW: 20.5 % — ABNORMAL HIGH (ref 11.5–14.5)
WBC: 18.8 10*3/uL — AB (ref 3.6–11.0)

## 2017-01-03 LAB — URINE CULTURE: Culture: >=100000 — AB

## 2017-01-03 MED ORDER — GLYCOPYRROLATE 0.2 MG/ML IJ SOLN
0.2000 mg | INTRAMUSCULAR | Status: DC | PRN
  Filled 2017-01-03: qty 1

## 2017-01-03 MED ORDER — GLYCOPYRROLATE 0.2 MG/ML IJ SOLN
0.2000 mg | INTRAMUSCULAR | Status: DC | PRN
Start: 2017-01-03 — End: 2017-01-04
  Filled 2017-01-03: qty 1

## 2017-01-03 MED ORDER — BIOTENE DRY MOUTH MT LIQD: 15.0000 mL | OROMUCOSAL | Status: DC | PRN

## 2017-01-03 MED ORDER — LORAZEPAM 2 MG/ML PO CONC: 0.5000 mg | ORAL | Status: DC | PRN

## 2017-01-03 MED ORDER — ONDANSETRON 4 MG PO TBDP
4.0000 mg | ORAL_TABLET | Freq: Four times a day (QID) | ORAL | Status: DC | PRN
  Filled 2017-01-03: qty 1

## 2017-01-03 MED ORDER — MORPHINE SULFATE (CONCENTRATE) 10 MG/0.5ML PO SOLN
5.0000 mg | ORAL | Status: DC | PRN
  Administered 2017-01-03: 5 mg via SUBLINGUAL
  Administered 2017-01-04 (×2): 10 mg via SUBLINGUAL
  Filled 2017-01-03 (×3): qty 1

## 2017-01-03 MED ORDER — GLYCOPYRROLATE 1 MG PO TABS: 1.0000 mg | ORAL_TABLET | ORAL | Status: DC | PRN

## 2017-01-03 MED ORDER — POLYVINYL ALCOHOL 1.4 % OP SOLN
1.0000 [drp] | Freq: Four times a day (QID) | OPHTHALMIC | Status: DC | PRN
  Filled 2017-01-03: qty 15

## 2017-01-03 MED ORDER — ACETAMINOPHEN 650 MG RE SUPP
650.0000 mg | RECTAL | Status: DC | PRN
Start: 2017-01-03 — End: 2017-01-04

## 2017-01-03 MED ORDER — ONDANSETRON HCL 4 MG/2ML IJ SOLN
4.0000 mg | Freq: Four times a day (QID) | INTRAMUSCULAR | Status: DC | PRN
Start: 2017-01-03 — End: 2017-01-04

## 2017-01-03 MED ORDER — LORAZEPAM 0.5 MG PO TABS: 0.5000 mg | ORAL_TABLET | ORAL | Status: DC | PRN

## 2017-01-03 MED ORDER — MORPHINE SULFATE (CONCENTRATE) 10 MG/0.5ML PO SOLN: 5.0000 mg | ORAL | Status: DC | PRN

## 2017-01-03 MED ORDER — SODIUM CHLORIDE 0.9 % IV SOLN
1.0000 g | Freq: Two times a day (BID) | INTRAVENOUS | Status: DC
  Filled 2017-01-03 (×2): qty 1

## 2017-01-03 NOTE — Progress Notes (Signed)
Advanced Care Plan.  Purpose of Encounter: Hospice care and comfort care. Parties in Attendance: The patient, her son and granddaughter, palliative care nurse practitioner and me. Patient's Decisional Capacity: No. /Medical Story: The patient was admitted for sepsis due to UTI and pneumonia. Was found to have ESBL UTI and MRSA bacteremia. She has multiple medical problems, has very poor prognosis. After discussion these her son and granddaughter about the patient's very poor prognosis and treatment options. They decided comfort care.  Goals of Care Determinations: Comfort care. Plan:  Code Status: DO NOT RESUSCITATE.  Time spent discussing advance care planning 22 minutes.

## 2017-01-03 NOTE — Progress Notes (Signed)
Visit made. Patient seen lying in bed,alert, oxygen now increased to 3 liters, audible secretions noted. Patient continues on IV antibiotics, blood cultures show gram positive cocci, final result pending, WBC remain elevated. No cough noted at visit today, per chart note review patient does continue with a delayed cough after eating/drinking. As patient remains on oxygen, writer to arrange oxygen to be delivered to [place] prior to discharge. CSW [place] aware. Will continue to follow and update hospice team. Thank you. Flo Shanks RN, BSN, [place] and Palliative Care of [place], [facility] [phone] c

## 2017-01-03 NOTE — Progress Notes (Signed)
Pharmacy Antibiotic Note  Evelyn [name] is a 83 y.o. female admitted on 12/31/2016 with ESBL E coli  UTI.  Pharmacy has been consulted for meropenem dosing. UCx showing intermediate sensitivity to ertapenem; transitioning pt to meropenem.  Plan: Meropenem 1 g IV q12h  Height: 5\' 6"  (167.6 cm) Weight: 130 lb (59 kg) IBW/kg (Calculated) : 59.3  Temp (24hrs), Avg:97.9 F (36.6 C), Min:97.4 F (36.3 C), Max:98.3 F (36.8 C)   Recent Labs Lab 12/31/16 1331 12/31/16 1727 01/01/17 0443 01/01/17 0900 01/02/17 0737 01/02/17 1152 01/03/17 0918  WBC 14.6*  --  26.7*  --   --  17.8* 18.8*  CREATININE 0.91  --  0.84  --  0.70  --   --   LATICACIDVEN 2.7* 2.5*  --  1.9  --   --   --     Estimated Creatinine Clearance: 40.1 mL/min (by C-G formula based on SCr of 0.7 mg/dL).    Allergies  Allergen Reactions  . Macrolides And Ketolides Other (See Comments)  . Oxycodone Nausea Only  . Ciprofloxacin Swelling, Rash and Other (See Comments)  . Neomy-Bacit-Polymyx-Pramoxine Rash    "All antibiotics"  . Other Rash    "All antibiotics"  . Sulfa Antibiotics Swelling, Rash and Other (See Comments)    Antimicrobials this admission:  Ertapenem 6/3 >>  6/5 Meropenem 6/6 >> Vanc 6/3 >>    Microbiology results: 6/4 BCx: 2/2 positive for GPC, MecA +  6/3 UCx: >100k E coli ESBL intermediate to ertapenem 6/3 MRSA PCR: negative  Thank you for allowing pharmacy to be a part of this patient's care.  Rocky Morel 01/03/2017 2:33 PM

## 2017-01-03 NOTE — Progress Notes (Addendum)
[facility] [facility] [place] NAME: Evelyn [name]    MR#:  [id number]  DATE OF BIRTH:  Dec 11, 1919  SUBJECTIVE:  CHIEF COMPLAINT:   Chief Complaint  Patient presents with  . Fall   The patient is demented, noncommunicative. persistent intermittent cough per RN, On O2 [place] 3 L. REVIEW OF SYSTEMS:  Review of Systems  Unable to perform ROS: Dementia  Patient have dementia and she is hard of hearing so does not give me any review of system.  DRUG ALLERGIES:   Allergies  Allergen Reactions  . Macrolides And Ketolides Other (See Comments)  . Oxycodone Nausea Only  . Ciprofloxacin Swelling, Rash and Other (See Comments)  . Neomy-Bacit-Polymyx-Pramoxine Rash    "All antibiotics"  . Other Rash    "All antibiotics"  . Sulfa Antibiotics Swelling, Rash and Other (See Comments)   VITALS:  Blood pressure 126/64, pulse 91, temperature 98.3 F (36.8 C), temperature source Axillary, resp. rate 18, height 5\' 6"  (1.676 m), weight 130 lb (59 kg), SpO2 93 %. PHYSICAL EXAMINATION:  Physical Exam  Constitutional: She is well-developed, well-nourished, and in no distress.  HENT:  Head: Normocephalic.  Eyes: Conjunctivae and EOM are normal. No scleral icterus.  Neck: Normal range of motion. Neck supple. No JVD present. No tracheal deviation present.  Cardiovascular: Normal rate, regular rhythm and normal heart sounds.  Exam reveals no gallop.   No murmur heard. Pulmonary/Chest: Effort normal and breath sounds normal. No respiratory distress. She has no wheezes. She has no rales.   right upper lobe with scattered rhonchi.  Abdominal: Soft. Bowel sounds are normal. She exhibits no distension. There is no tenderness.  Musculoskeletal: Normal range of motion. She exhibits no edema or tenderness.  Neurological:  Unable to exam  Skin: No rash noted. No erythema.  Sacral DU stage 2  Psychiatric:  Demented.   LABORATORY PANEL:  Female CBC  Recent Labs Lab  01/03/17 0918  WBC 18.8*  HGB 9.2*  HCT 28.6*  PLT 434   ------------------------------------------------------------------------------------------------------------------ Chemistries   Recent Labs Lab 12/31/16 1331 01/01/17 0443 01/02/17 0737  NA 138 143  --   K 4.3 3.6  --   CL 102 109  --   CO2 26 25  --   GLUCOSE 128* 109*  --   BUN 20 19  --   CREATININE 0.91 0.84 0.70  CALCIUM 9.1 8.3*  --   AST 24  --   --   ALT 13*  --   --   ALKPHOS 149*  --   --   BILITOT 0.6  --   --    RADIOLOGY:  No results found. ASSESSMENT AND PLAN:   * UTI and pneumonia.   Recent UTI with ESBL. She is on ertapenem, Urine culture show ESBL.   * Sepsis due to UTI, pneumonia and bacteremia.   Evident by tachycardia, tachypnea, elevated white blood cell count.   Secondary to UTI and pneumonia, given IV fluid and monitor. Blood cultures: MRSA. Started vancomycin. Urine culture showed ESBL. Continue ertapenem and vancomycin.  The patient is a hospice patient and has poor prognosis may not be a good candidate for long-term IV antibiotics.  The patient's son and other family member decided comfort care. I will discontinue antibiotics.  Lactic acidosis. Improved.  * Dysphagia Dysphagia 1 diet per speech and swallow evaluation.    * Hypertension   Continue home medications.  * Hypothyroidism   Continue levothyroxine.  Pelvic fracture. Pain control.  Sacral DU stage 2.   I discussed these palliative care nurse practitioner. The patient is going to be discharged to hospice home tomorrow. All the records are reviewed and case discussed with Care Management/Social Worker. Management plans discussed with the patient, her son and daughter=-in-law and they are in agreement.  CODE STATUS: DNR  TOTAL TIME TAKING CARE OF THIS PATIENT: 28 minutes.   More than 50% of the time was spent in counseling/coordination of care: YES  POSSIBLE D/C IN 1-2 DAYS, DEPENDING ON CLINICAL  CONDITION.   Demetrios Loll M.D on 01/03/2017 at 2:47 PM  Between 7am to 6pm - Pager - [phone]  After 6pm go to [email or web address] - Proofreader  [facility] [place] [facility]  Office  [phone]  CC: Primary care physician; Crecencio Mc, MD  Note: This dictation was prepared with Dragon dictation along with smaller phrase technology. Any transcriptional errors that result from this process are unintentional.

## 2017-01-03 NOTE — Progress Notes (Signed)
Pharmacy Antibiotic Note  Evelyn [name] is a 83 y.o. female admitted on 12/31/2016 with bacteremia.  Pharmacy has been consulted for Vancomycin dosing.  6/4: Lab Called BCID information to Hank Zompa RPh:  1 aerobic bottle positive for Gram Positive Cocci, Mec A positive. 6/4: BCID information discussed with MD. Patient with elevated WBC, appears clinically sick therefore MD would like to add Vancomycin and follow-up Blood cx results.  Plan: Patient received Vancomycin 1 gram IV x 1 in ER on 12/31/16 at 1731. Will begin Vancomycin 1 gram IV Q24h. Ke 0.036,  T1/2  19.25  Vd 41.3   Will schedule trough prior to 4th dose on 6/7. F/u Blood cx results to see if contamination or not.    Height: 5\' 6"  (167.6 cm) Weight: 130 lb (59 kg) IBW/kg (Calculated) : 59.3  Temp (24hrs), Avg:98 F (36.7 C), Min:97.4 F (36.3 C), Max:98.3 F (36.8 C)   Recent Labs Lab 12/31/16 1331 12/31/16 1727 01/01/17 0443 01/01/17 0900 01/02/17 0737 01/02/17 1152  WBC 14.6*  --  26.7*  --   --  17.8*  CREATININE 0.91  --  0.84  --  0.70  --   LATICACIDVEN 2.7* 2.5*  --  1.9  --   --     Estimated Creatinine Clearance: 40.1 mL/min (by C-G formula based on SCr of 0.7 mg/dL).    Allergies  Allergen Reactions  . Macrolides And Ketolides Other (See Comments)  . Oxycodone Nausea Only  . Ciprofloxacin Swelling, Rash and Other (See Comments)  . Neomy-Bacit-Polymyx-Pramoxine Rash    "All antibiotics"  . Other Rash    "All antibiotics"  . Sulfa Antibiotics Swelling, Rash and Other (See Comments)    Antimicrobials this admission: Ertapenem 6/3 >>   Vanc 6/3 >>    Dose adjustments this admission:    Microbiology results: 6/4 BCx: 1 of 4 bottles positive for GPC, MecA +  6/3 UCx: E coli sens pending   Sputum:    6/3 MRSA PCR: negative  Thank you for allowing pharmacy to be a part of this patient's care.  Rocky Morel 01/03/2017 8:45 AM

## 2017-01-03 NOTE — Progress Notes (Signed)
Daily Progress Note   Patient Name: Evelyn [name]       Date: 01/03/2017 DOB: October 28, 1919  Age: 83 y.o. MRN#: [id number] Attending Physician: Demetrios Loll, MD Primary Care Physician: Crecencio Mc, MD Admit Date: 12/31/2016  Reason for Consultation/Follow-up: Establishing goals of care  Subjective: Evelyn [name] appears much worse today with labored breathing and pallor.   Length of Stay: 3  Current Medications: Scheduled Meds:  . acetaminophen  650 mg Oral BID  . acidophilus  1 capsule Oral Daily  . amLODipine  10 mg Oral Daily  . apixaban  2.5 mg Oral Q12H  . aspirin EC  81 mg Oral Daily  . feeding supplement (ENSURE ENLIVE)  237 mL Oral TID BM  . ipratropium-albuterol  3 mL Nebulization Q6H  . levothyroxine  100 mcg Oral QAC breakfast  . pantoprazole  40 mg Oral Daily  . propranolol  30 mg Oral BID  . traZODone  25 mg Oral QHS  . vitamin B-12  1,000 mcg Oral Daily    Continuous Infusions: . sodium chloride 75 mL/hr at 01/02/17 1042  . ertapenem 1 g (01/02/17 1824)  . vancomycin Stopped (01/02/17 1723)    PRN Meds: diphenhydrAMINE, docusate sodium, morphine CONCENTRATE, oxyCODONE-acetaminophen, traMADol  Physical Exam  Constitutional: She appears well-developed. She appears lethargic. She has a sickly appearance.  Thin, frail, pale  HENT:  Head: Normocephalic and atraumatic.  Cardiovascular: Tachycardia present.   Pulmonary/Chest: Accessory muscle usage present. No tachypnea. She is in respiratory distress. She has decreased breath sounds in the right middle field, the right lower field, the left upper field, the left middle field and the left lower field. She has rhonchi.  Abdominal: Soft. Normal appearance.  Neurological: She appears lethargic. She is disoriented.    Nursing note and vitals reviewed.           Vital Signs: BP 126/64 (BP Location: Right Arm)   Pulse 91   Temp 98.3 F (36.8 C) (Axillary)   Resp 18   Ht 5' 6"  (1.676 m)   Wt 59 kg (130 lb)   SpO2 93%   BMI 20.98 kg/m  SpO2: SpO2: 93 % O2 Device: O2 Device: Nasal Cannula O2 Flow Rate: O2 Flow Rate (L/min): 3 L/min  Intake/output summary:  Intake/Output Summary (Last 24 hours) at 01/03/17 1104  Last data filed at 01/02/17 1645  Gross per 24 hour  Intake           1762.5 ml  Output                0 ml  Net           1762.5 ml   LBM: Last BM Date: 01/03/17 Baseline Weight: Weight: 59 kg (130 lb) Most recent weight: Weight: 59 kg (130 lb)       Palliative Assessment/Data:    Flowsheet Rows     Most Recent Value  Intake Tab  Referral Department  Hospitalist  Unit at Time of Referral  Orthopedic Unit  Palliative Care Primary Diagnosis  Sepsis/Infectious Disease  Date Notified  12/31/16  Palliative Care Type  Return patient Palliative Care  Reason for referral  Clarify Goals of Care, Counsel Regarding Hospice  Date of Admission  12/31/16  # of days IP prior to Palliative referral  0  Clinical Assessment  Psychosocial & Spiritual Assessment  Palliative Care Outcomes      Patient Active Problem List   Diagnosis Date Noted  . Aspiration pneumonia of both lungs ([place])   . Pressure injury of skin 01/01/2017  . Community acquired pneumonia 12/31/2016  . Pelvic fracture ([place]) 12/31/2016  . Sepsis ([place]) 12/31/2016  . DNR (do not resuscitate)   . Acute encephalopathy 11/14/2016  . Hypokalemia 11/14/2016  . Generalized weakness 11/14/2016  . Goals of care, counseling/discussion   . DNR (do not resuscitate) discussion   . Palliative care encounter   . UTI (urinary tract infection) 11/09/2016  . Diaper dermatitis 08/16/2016  . Decubitus ulcer of sacral region, stage 1 08/14/2016  . Urinary incontinence 06/07/2016  . Encounter for Medicare annual wellness exam  05/28/2016  . Inflamed external hemorrhoid 05/28/2016  . Aortic stenosis, mild 05/14/2016  . Hospital discharge follow-up 05/14/2016  . Skin benign neoplasm 01/26/2016  . Anorexia 06/22/2015  . Chronic diastolic heart failure ([place]) 03/04/2015  . Paroxysmal atrial fibrillation ([place]) 05/29/2014  . ESBL (extended spectrum beta-lactamase) producing bacteria infection 12/09/2013  . Unspecified vitamin D deficiency 11/25/2013  . Chronic insomnia 11/23/2013  . Bradycardia 04/10/2013  . Generalized muscle weakness 04/10/2013  . Anemia, iron deficiency 12/27/2012  . Anemia, pernicious 10/15/2011  . Spinal stenosis of lumbar region 10/15/2011  . Hypertension   . Hyperlipidemia   . Hypothyroidism   . Cystitis, chronic   . History of lymphoma     Palliative Care Assessment & Plan   HPI: 83 y.o. female  with past medical history of recurrent UTI, chronic cystitis, h/o lymphoma, anemia, lymphoma, CAD, GERD, HTN, HLD, hypothyroidism (thyroid removed), insomnia admitted on 12/31/2016 with unwitnessed fall and found to have UTI and likely aspiration pneumonia. Has had hospice at ALF but hospice was not notified prior to admission.   Assessment: I met today at Evelyn [name]'s bedside with son, [name], and granddaughter, Larene Beach. We had a long discussion regarding UTI, aspiration pneumonia (concern for continued aspiration), and bacteremia. She appears to have declined since this morning to me. They all understand that she is tired and weak and clearly state that "she has suffered." I also discussed via telephone with her 2 other sons. All family agree to proceed at this time with full comfort care and no life prolonging measures. If stable will proceed with transition to hospice facility tomorrow. Emotional support provided.   Recommendations/Plan:  Full comfort care  Provide comfort with SL medications and avoid replacing  IV if able to keep comfortable  Pain/dyspnea: Roxanol 5-10 mg every 30 min  prn.   Anxiety: Ativan SL 0.5 mg every 4 hours prn.   Secretions: Robinul prn po/SQ/IV.   Goals of Care and Additional Recommendations:  Limitations on Scope of Treatment: Full Comfort Care  Code Status:  DNR  Prognosis:   Hours - Days  Discharge Planning:  Hospice facility   Thank you for allowing the Palliative Medicine Team to assist in the care of this patient.   Time In: 1330 Time Out: 1500 Total Time 49mn Prolonged Time Billed  yes       Greater than 50%  of this time was spent counseling and coordinating care related to the above assessment and plan.  AVinie Sill NP Palliative Medicine Team Pager # [phone](M-F 8a-5p) Team Phone # [phone](Nights/Weekends)

## 2017-01-03 NOTE — Plan of Care (Signed)
Problem: Physical Regulation: Goal: Ability to maintain clinical measurements within normal limits will improve Outcome: Not Progressing Pt wil be comfort care

## 2017-01-03 NOTE — Progress Notes (Signed)
Per palliative NP patient will likely need oxygen at discharge. Clinical Education officer, museum (CSW) contacted [facility] hospice liaison and made her aware of above. Per Santiago Glad hospice will take care of oxygen arrangements at [facility]. CSW will continue to follow and assist as needed.   [employer], LCSW [phone]

## 2017-01-04 LAB — CULTURE, BLOOD (ROUTINE X 2)
SPECIAL REQUESTS: ADEQUATE
Special Requests: ADEQUATE

## 2017-01-04 MED ORDER — LORAZEPAM 2 MG/ML PO CONC: 0.6000 mg | ORAL | 0 refills | Status: AC | PRN

## 2017-01-04 MED ORDER — GLYCOPYRROLATE 1 MG PO TABS: 1.0000 mg | ORAL_TABLET | ORAL | 0 refills | Status: AC | PRN

## 2017-01-04 MED ORDER — POLYVINYL ALCOHOL 1.4 % OP SOLN: 1.0000 [drp] | Freq: Four times a day (QID) | OPHTHALMIC | 0 refills | Status: AC | PRN

## 2017-01-04 MED ORDER — MORPHINE SULFATE (CONCENTRATE) 10 MG/0.5ML PO SOLN: 5.0000 mg | ORAL | 0 refills | Status: AC | PRN

## 2017-01-04 NOTE — Discharge Summary (Signed)
[name] NAME: Evelyn [name]    MR#:  [id number]  DATE OF BIRTH:  11/26/1919  DATE OF ADMISSION:  12/31/2016   ADMITTING PHYSICIAN: Vaughan Basta, MD  DATE OF DISCHARGE: 01/04/2017  PRIMARY CARE PHYSICIAN: Crecencio Mc, MD   ADMISSION DIAGNOSIS:   Healthcare-associated pneumonia [J18.9] Demand ischemia ([place]) [I24.8] Elevated troponin I level [R74.8] Elevated lactic acid level [R79.89] Fall, initial encounter [W19.XXXA] Closed fracture of right superior pubic ramus, initial encounter ([place]) [S32.511A] Sepsis, due to unspecified organism ([place]) [A41.9] Urinary tract infection with hematuria, site unspecified [N39.0, R31.9] Dementia without behavioral disturbance, unspecified dementia type [F03.90]  DISCHARGE DIAGNOSIS:   Principal Problem:   UTI (urinary tract infection) Active Problems:   Community acquired pneumonia   Pelvic fracture ([place])   Sepsis ([place])   Pressure injury of skin   Aspiration pneumonia of both lungs ([place])   Palliative care by specialist   SECONDARY DIAGNOSIS:   Past Medical History:  Diagnosis Date  . Anemia    hx of blood transfusion  . Cancer ([place])    lymphomia  . Coarse tremors    head shakes if does not take propranolol  . Coronary artery disease   . GERD (gastroesophageal reflux disease)   . History of lymphoma   . Hyperlipidemia   . Hypertension   . Hypothyroidism   . Insomnia    takes remeron nightly  . Neuromuscular disorder ([place])   . Other chronic cystitis    Radiation , botox injections  . Self-catheterizes urinary bladder    twice daily  . Thyroid disease    had thyroid removed    HOSPITAL COURSE:   83 year old female with past medical history significant for recurrent UTIs, chronic cystitis, history of lymphoma, anemia, CAD, hypertension, hypothyroidism admitted from home Place 1 12/31/2016 secondary to fall and infection.  #1 sepsis-secondary to ESBL  UTI and also possible aspiration pneumonia. -Blood cultures were growing MRSA and urine cultures are growing ESBL gram-negative rods. -Started on fluids and broad-spectrum antibiotics -However due to overall poor prognosis, family has decided for comfort care. Currently antibiotics are stopped. Patient will be discharged to hospice home.  #2 dysphagia-patient was evaluated by speech therapy while in the hospital who have recommended a dysphagia 1 diet for her  #3 hypertension-medications will be held and patient will be only on comfort meds.  #4 fall-secondary to weakness and sepsis. Currently she is bedbound.  Patient will be discharged to hospice home  DISCHARGE CONDITIONS:   Critical CONSULTS OBTAINED:    palliative care consult  DRUG ALLERGIES:   Allergies  Allergen Reactions  . Macrolides And Ketolides Other (See Comments)  . Oxycodone Nausea Only  . Ciprofloxacin Swelling, Rash and Other (See Comments)  . Neomy-Bacit-Polymyx-Pramoxine Rash    "All antibiotics"  . Other Rash    "All antibiotics"  . Sulfa Antibiotics Swelling, Rash and Other (See Comments)   DISCHARGE MEDICATIONS:   Allergies as of 01/04/2017      Reactions   Macrolides And Ketolides Other (See Comments)   Oxycodone Nausea Only   Ciprofloxacin Swelling, Rash, Other (See Comments)   Neomy-bacit-polymyx-pramoxine Rash   "All antibiotics"   Other Rash   "All antibiotics"   Sulfa Antibiotics Swelling, Rash, Other (See Comments)      Medication List    STOP taking these medications   acetaminophen 325 MG tablet Commonly known as:  TYLENOL   acidophilus Caps capsule   amLODipine 10  MG tablet Commonly known as:  NORVASC   aspirin EC 81 MG tablet   conjugated estrogens vaginal cream Commonly known as:  PREMARIN   Cranberry 250 MG Tabs   DERMACLOUD EX   dibucaine 1 % ointment Commonly known as:  NUPERCAINAL   diphenhydrAMINE 25 mg capsule Commonly known as:  BENADRYL   ELIQUIS 2.5 MG  Tabs tablet Generic drug:  apixaban   ertapenem 1 g in sodium chloride 0.9 % 50 mL   feeding supplement (ENSURE ENLIVE) Liqd   levothyroxine 100 MCG tablet Commonly known as:  SYNTHROID, LEVOTHROID   mirtazapine 7.5 MG tablet Commonly known as:  REMERON   omeprazole 20 MG capsule Commonly known as:  PRILOSEC   propranolol 20 MG tablet Commonly known as:  INDERAL   senna-docusate 8.6-50 MG tablet Commonly known as:  Senokot-S   tolterodine 2 MG tablet Commonly known as:  DETROL   traMADol 50 MG tablet Commonly known as:  ULTRAM   traZODone 50 MG tablet Commonly known as:  DESYREL   triamcinolone cream 0.1 % Commonly known as:  KENALOG   vitamin B-12 1000 MCG tablet Commonly known as:  CYANOCOBALAMIN   zolpidem 5 MG tablet Commonly known as:  AMBIEN     TAKE these medications   glycopyrrolate 1 MG tablet Commonly known as:  ROBINUL Take 1 tablet (1 mg total) by mouth every 4 (four) hours as needed (excessive secretions).   LORazepam 2 MG/ML concentrated solution Commonly known as:  ATIVAN Take 0.3 mLs (0.6 mg total) by mouth every 4 (four) hours as needed for anxiety.   morphine CONCENTRATE 10 MG/0.5ML Soln concentrated solution Place 0.25-0.5 mLs (5-10 mg total) under the tongue every 30 (thirty) minutes as needed for moderate pain, severe pain or shortness of breath.   polyvinyl alcohol 1.4 % ophthalmic solution Commonly known as:  LIQUIFILM TEARS Place 1 drop into both eyes 4 (four) times daily as needed for dry eyes.        DISCHARGE INSTRUCTIONS:   1. Discharge to hospice home  DIET:   Regular diet  ACTIVITY:   Bedrest  OXYGEN:   Home Oxygen: Yes.    Oxygen Delivery: 4 liters/min via Patient connected to nasal cannula oxygen  DISCHARGE LOCATION:   Hospice Home   If you experience worsening of your admission symptoms, develop shortness of breath, life threatening emergency, suicidal or homicidal thoughts you must seek medical  attention immediately by calling 911 or calling your MD immediately  if symptoms less severe.  You Must read complete instructions/literature along with all the possible adverse reactions/side effects for all the Medicines you take and that have been prescribed to you. Take any new Medicines after you have completely understood and accpet all the possible adverse reactions/side effects.   Please note  You were cared for by a hospitalist during your hospital stay. If you have any questions about your discharge medications or the care you received while you were in the hospital after you are discharged, you can call the unit and asked to speak with the hospitalist on call if the hospitalist that took care of you is not available. Once you are discharged, your primary care physician will handle any further medical issues. Please note that NO REFILLS for any discharge medications will be authorized once you are discharged, as it is imperative that you return to your primary care physician (or establish a relationship with a primary care physician if you do not have one) for your aftercare  needs so that they can reassess your need for medications and monitor your lab values.    On the day of Discharge:  VITAL SIGNS:   Blood pressure 126/64, pulse 91, temperature 98.3 F (36.8 C), temperature source Axillary, resp. rate 18, height 5\' 6"  (1.676 m), weight 59 kg (130 lb), SpO2 90 %.  PHYSICAL EXAMINATION:    GENERAL:  83 y.o.-year-old ill nourished elderly patient lying in the bed with no acute distress.  EYES: Pupils equal, round, reactive to light and accommodation. No scleral icterus. Extraocular muscles intact.  HEENT: Head atraumatic, normocephalic. Oropharynx and nasopharynx clear. Speech is soft NECK:  Supple, no jugular venous distention. No thyroid enlargement, no tenderness.  LUNGS: Coarse breath sounds bilaterally, no wheezing, rales,rhonchi or crepitation. Appearing tachypneic at times.  Accessory muscles used noted with exertion. CARDIOVASCULAR: S1, S2 normal. No rubs, or gallops. 3/6 systolic murmur is present ABDOMEN: Soft, non-tender, non-distended. Bowel sounds present. No organomegaly or mass.  EXTREMITIES: No pedal edema, cyanosis, or clubbing.  NEUROLOGIC: Cranial nerves II through XII are intact. Right side of the body appears much weaker than the left side. Strength overall is decreased.. Sensation intact. Gait not checked.  PSYCHIATRIC: The patient is alert and oriented x1-2  SKIN: No obvious rash, lesion, or ulcer. Sacral decub ulcer stage II noted on the back  DATA REVIEW:   CBC  Recent Labs Lab 01/03/17 0918  WBC 18.8*  HGB 9.2*  HCT 28.6*  PLT 434    Chemistries   Recent Labs Lab 12/31/16 1331 01/01/17 0443 01/02/17 0737  NA 138 143  --   K 4.3 3.6  --   CL 102 109  --   CO2 26 25  --   GLUCOSE 128* 109*  --   BUN 20 19  --   CREATININE 0.91 0.84 0.70  CALCIUM 9.1 8.3*  --   AST 24  --   --   ALT 13*  --   --   ALKPHOS 149*  --   --   BILITOT 0.6  --   --      Microbiology Results  Results for orders placed or performed during the hospital encounter of 12/31/16  MRSA PCR Screening     Status: None   Collection Time: 12/31/16  1:00 PM  Result Value Ref Range Status   MRSA by PCR NEGATIVE NEGATIVE Final    Comment:        The GeneXpert MRSA Assay (FDA approved for NASAL specimens only), is one component of a comprehensive MRSA colonization surveillance program. It is not intended to diagnose MRSA infection nor to guide or monitor treatment for MRSA infections.   Blood Culture (routine x 2)     Status: Abnormal   Collection Time: 12/31/16  1:31 PM  Result Value Ref Range Status   Specimen Description BLOOD LEFT ANTECUBITAL  Final   Special Requests   Final    BOTTLES DRAWN AEROBIC AND ANAEROBIC Blood Culture adequate volume   Culture  Setup Time   Final    GRAM POSITIVE COCCI AEROBIC BOTTLE ONLY CRITICAL RESULT CALLED  TO, READ BACK BY AND VERIFIED WITH: HANK ZOMPA AT 2992 ON 01/01/17 [place].    Culture (A)  Final    STAPHYLOCOCCUS SPECIES (COAGULASE NEGATIVE) THE SIGNIFICANCE OF ISOLATING THIS ORGANISM FROM A SINGLE SET OF BLOOD CULTURES WHEN MULTIPLE SETS ARE DRAWN IS UNCERTAIN. PLEASE NOTIFY THE MICROBIOLOGY DEPARTMENT WITHIN ONE WEEK IF SPECIATION AND SENSITIVITIES ARE REQUIRED. Performed at [facility], [place] [address] [place], [place] [address]    Report Status 01/04/2017 FINAL  Final  Blood Culture ID Panel (Reflexed)     Status: Abnormal   Collection Time: 12/31/16  1:31 PM  Result Value Ref Range Status   Enterococcus species NOT DETECTED NOT DETECTED Final   Listeria monocytogenes NOT DETECTED NOT DETECTED Final   Staphylococcus species DETECTED (A) NOT DETECTED Final    Comment: Methicillin (oxacillin) resistant coagulase negative staphylococcus. Possible blood culture contaminant (unless isolated from more than one blood culture draw or clinical case suggests pathogenicity). No antibiotic treatment is indicated for blood  culture contaminants. CRITICAL RESULT CALLED TO, READ BACK BY AND VERIFIED WITH: HANK ZOMPA AT 1308 ON 01/01/17 [place].    Staphylococcus aureus NOT DETECTED NOT DETECTED Final   Methicillin resistance DETECTED (A) NOT DETECTED Final    Comment: CRITICAL RESULT CALLED TO, READ BACK BY AND VERIFIED WITH: HANK ZOMPA AT 6578 ON 01/01/17 [place].    Streptococcus species NOT DETECTED NOT DETECTED Final   Streptococcus agalactiae NOT DETECTED NOT DETECTED Final   Streptococcus pneumoniae NOT DETECTED NOT DETECTED Final   Streptococcus pyogenes NOT DETECTED NOT DETECTED Final   Acinetobacter baumannii NOT DETECTED NOT DETECTED Final   Enterobacteriaceae species NOT DETECTED NOT DETECTED Final   Enterobacter cloacae complex NOT DETECTED NOT DETECTED Final   Escherichia coli NOT DETECTED NOT DETECTED Final   Klebsiella oxytoca NOT DETECTED NOT DETECTED Final   Klebsiella pneumoniae  NOT DETECTED NOT DETECTED Final   Proteus species NOT DETECTED NOT DETECTED Final   Serratia marcescens NOT DETECTED NOT DETECTED Final   Haemophilus influenzae NOT DETECTED NOT DETECTED Final   Neisseria meningitidis NOT DETECTED NOT DETECTED Final   Pseudomonas aeruginosa NOT DETECTED NOT DETECTED Final   Candida albicans NOT DETECTED NOT DETECTED Final   Candida glabrata NOT DETECTED NOT DETECTED Final   Candida krusei NOT DETECTED NOT DETECTED Final   Candida parapsilosis NOT DETECTED NOT DETECTED Final   Candida tropicalis NOT DETECTED NOT DETECTED Final  Blood Culture (routine x 2)     Status: None (Preliminary result)   Collection Time: 12/31/16  1:38 PM  Result Value Ref Range Status   Specimen Description BLOOD LEFT ANTECUBITAL  Final   Special Requests   Final    BOTTLES DRAWN AEROBIC AND ANAEROBIC Blood Culture adequate volume   Culture  Setup Time   Final    GRAM POSITIVE COCCI AEROBIC BOTTLE ONLY CRITICAL VALUE NOTED.  VALUE IS CONSISTENT WITH PREVIOUSLY REPORTED AND CALLED VALUE.    Culture   Final    GRAM POSITIVE COCCI IDENTIFICATION TO FOLLOW Performed at [facility], [place] [address] [place], [place] [address]    Report Status PENDING  Incomplete  Urine culture     Status: Abnormal   Collection Time: 12/31/16  1:49 PM  Result Value Ref Range Status   Specimen Description URINE, RANDOM  Final   Special Requests NONE  Final   Culture (A)  Final    >=100,000 COLONIES/mL ESCHERICHIA COLI Confirmed Extended Spectrum Beta-Lactamase Producer (ESBL) Performed at [facility], [address], [place], [place] [address]    Report Status 01/03/2017 FINAL  Final   Organism ID, Bacteria ESCHERICHIA COLI (A)  Final      Susceptibility   Escherichia coli - MIC*    AMPICILLIN >=32 RESISTANT Resistant     CEFAZOLIN >=64 RESISTANT Resistant     CEFTRIAXONE >=64 RESISTANT Resistant  CIPROFLOXACIN >=4 RESISTANT Resistant     GENTAMICIN <=1 SENSITIVE  Sensitive     IMIPENEM <=0.25 SENSITIVE Sensitive     NITROFURANTOIN 256 RESISTANT Resistant     TRIMETH/SULFA >=320 RESISTANT Resistant     AMPICILLIN/SULBACTAM 8 SENSITIVE Sensitive     PIP/TAZO <=4 SENSITIVE Sensitive     Extended ESBL POSITIVE Resistant     ERTAPENEM Value in next row Intermediate      INTERMEDIATE4    * >=100,000 COLONIES/mL ESCHERICHIA COLI    RADIOLOGY:  No results found.   Management plans discussed with the patient, family and they are in agreement.  CODE STATUS:     Code Status Orders        Start     Ordered   01/03/17 1517  Do not attempt resuscitation (DNR)  Continuous    Question Answer Comment  In the event of cardiac or respiratory ARREST Do not call a "code blue"   In the event of cardiac or respiratory ARREST Do not perform Intubation, CPR, defibrillation or ACLS   In the event of cardiac or respiratory ARREST Use medication by any route, position, wound care, and other measures to relive pain and suffering. May use oxygen, suction and manual treatment of airway obstruction as needed for comfort.      01/03/17 1517    Code Status History    Date Active Date Inactive Code Status Order ID Comments User Context   12/31/2016  5:54 PM 01/03/2017  3:17 PM DNR [id number]  Vaughan Basta, MD Inpatient   11/14/2016  3:16 PM 11/15/2016  2:57 PM DNR [id number]  Pershing Proud, NP Inpatient   11/09/2016  1:00 PM 11/14/2016  3:16 PM Full Code [id number]  Demetrios Loll, MD Inpatient   10/03/2016 11:40 PM 10/06/2016  6:25 PM Full Code [id number]  Hugelmeyer, Ubaldo Glassing, DO Inpatient   05/06/2016  1:01 AM 05/09/2016  6:43 PM Full Code [id number]  Lance Coon, MD Inpatient   05/05/2015  8:48 PM 05/10/2015  5:35 PM Full Code [id number]  Henreitta Leber, MD Inpatient      TOTAL TIME TAKING CARE OF THIS PATIENT: 38 minutes.    Gladstone Lighter M.D on 01/04/2017 at 11:12 AM  Between 7am to 6pm - Pager - [phone]  After 6pm go to [email or web address] - [place]  [facility] [place]  Office  [phone]  CC: Primary care physician; Crecencio Mc, MD   Note: This dictation was prepared with Dragon dictation along with smaller phrase technology. Any transcriptional errors that result from this process are unintentional.

## 2017-01-04 NOTE — Progress Notes (Addendum)
Per palliative NP patient's family is agreeable for patient to transfer to [place] today. Per Santiago Glad hospice liaison patient can transfer to the hospice home this afternoon. Clinical Education officer, museum (CSW) prepared D/C packet and EMS form. RN and MD aware of above. Please reconsult if future social work needs arise. CSW signing off.   CSW contacted [facility] and made her aware of above.   [employer], LCSW [phone]

## 2017-01-04 NOTE — Progress Notes (Signed)
Visit made. Patient's son's [name] and Lanny Hurst along with 3 grandchildren present at bed side. Patient continues to decline, oxygen saturations lower, required increased in oxygen to 5 liters. She is alert, very pale, continues with loose nonproductive cough and audible secretions. She required liquid morphine last evening for dyspnea and does appear at this visit to be using accessory muscles to breathe. Staff RN Velna Hatchet made aware. Family educated on use and need for liquid morphine and were in agreement of the dose. Per conversation with family and Palliative Medicine NP Vinie Sill plan is for transition to the hospice home. Hospital and Hospice team updated. Signed DNR in place in discharge packet.Writer to arrange EMS transport this afternoon. Thank you. Flo Shanks RN, BSN, [facility] [place], hospital Liaison [phone] c

## 2017-01-04 NOTE — Progress Notes (Signed)
Patient discharged to hospice home via EMS. No IV present on d/c. PO Roxanol administered prior to transport for comfort. Family at bedside. Report called by Hospice liaison. DNR provided to EMS.

## 2017-01-04 NOTE — Progress Notes (Signed)
EMS notified for transport. Staff RN Velna Hatchet and family made aware. Thank you. Flo Shanks Rn, BSN, [facility] [place], hospital  liaison [phone] c

## 2017-01-04 NOTE — Progress Notes (Signed)
  Speech Language Pathology Treatment: Dysphagia  Patient Details Name: Evelyn [name] MRN: [id number] DOB: 04/10/1920 Today's Date: 01/04/2017 Time: 0623-7628 SLP Time Calculation (min) (ACUTE ONLY): 50 min  Assessment / Plan / Recommendation Clinical Impression  Met w/ pt and Son this PM and attempted to work w/ pt re: dysphagia goals, strategies. However, pt appeared to weak and fatigued for safe participation in tasks of eating/drinking. Instead sat w/ Son and thoroughly discussed issues, and gave education on, dysphagia, s/s of aspiration, risks/factors increasing dysphagia, strategies and precautions when feeding to lessen risk for aspiration, cues to watch for from pt re: her toleration of the po's, and food consistency and preparation for easier oral intake for pt. Son was appreciative of information and education given; he stated he "just wanted to know how to help her". Reminded him that ST services were available as needed; recommended remaining on a more conservative po diet consistency (dyspahgia level 1 w/ Nectar liquids) to lessen risk for aspiration at this time; frequent oral care. Son agreed.  ST services will f/u as needed while pt is admitted; noted palliative care following pt. NSG updated.    HPI HPI: Pt is a 83 y.o. female w/ multiple medical issues baseline including a current fall w/ fracture(pelvis) who reportedly is minimally verbal at baseline due to chronic dementia and she is confined to her bed after several illnesses over the last few months.  She presents by EMS for evaluation after being found down on the ground and having tachycardia in the 130s.  The fall was unwitnessed and the circumstances unclear.  The patient is not able to provide any history or review of systems.  She is ill-appearing but nontoxic upon arrival, and actually denied having any pain when I ask her but is not oriented to person, place, or situation. Pt has had a h/o dysphagia in the past few  months per family report and placed on a dysphagia diet including thickened liquids at the NH. Family is unsure of dyspahgia details. Pt appears fatigued, weak at this time. Son endorsed min congested coughing; fatigue.       SLP Plan  Continue with current plan of care (education w/ family/Son as needed)       Recommendations  Diet recommendations: Dysphagia 1 (puree);Nectar-thick liquid (possibly some broken down, moist foods(?)) Liquids provided via: Teaspoon;Cup Medication Administration: Crushed with puree (for easier clearing/swallowing) Supervision: Staff to assist with self feeding;Full supervision/cueing for compensatory strategies Compensations: Minimize environmental distractions;Slow rate;Small sips/bites;Lingual sweep for clearance of pocketing;Multiple dry swallows after each bite/sip;Follow solids with liquid Postural Changes and/or Swallow Maneuvers: Seated upright 90 degrees;Upright 30-60 min after meal                General recommendations:  (palliative care following) Oral Care Recommendations: Oral care BID;Staff/trained caregiver to provide oral care Follow up Recommendations: Skilled Nursing facility (TBD) SLP Visit Diagnosis: Dysphagia, oropharyngeal phase (R13.12) Plan: Continue with current plan of care (education w/ family/Son as needed)       Shannon, [place], CCC-SLP Watson,Katherine 01/04/2017, 11:08 AM   Addendum: this is a late entry note by this therapist for tx session performed on 6.5.18. Orinda Kenner, [place], CCC-SLP

## 2017-01-28 DEATH — deceased

## 2017-03-23 NOTE — Telephone Encounter (Signed)
error 

## 2017-09-13 ENCOUNTER — Ambulatory Visit: Payer: PPO | Admitting: Urology

## 2018-02-23 IMAGING — DX DG CHEST 2V
2 series · 2 of 2 positions shown · non-contrast
Comparison: 05/05/2015

CLINICAL DATA: Cough, history hypertension, lymphoma, former smoker

EXAM:
CHEST  2 VIEW

[chest pa]
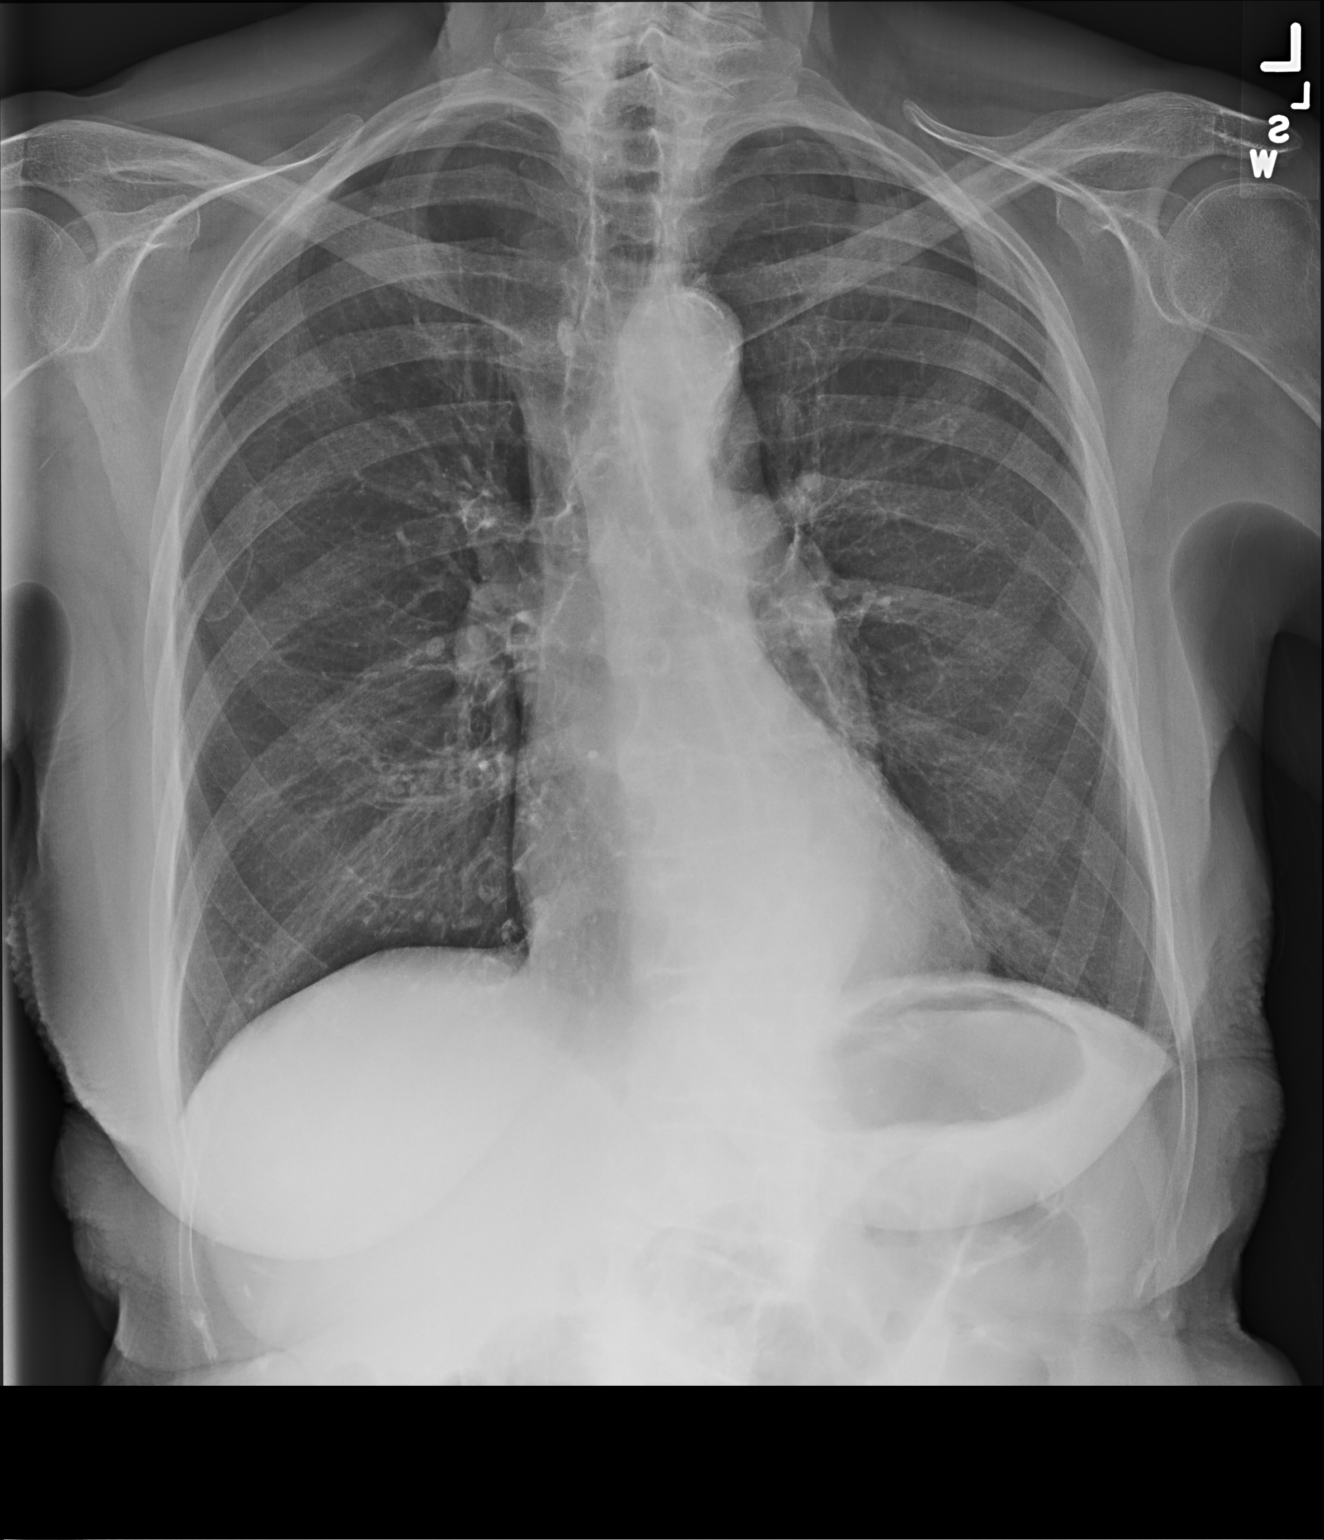

[chest lat]
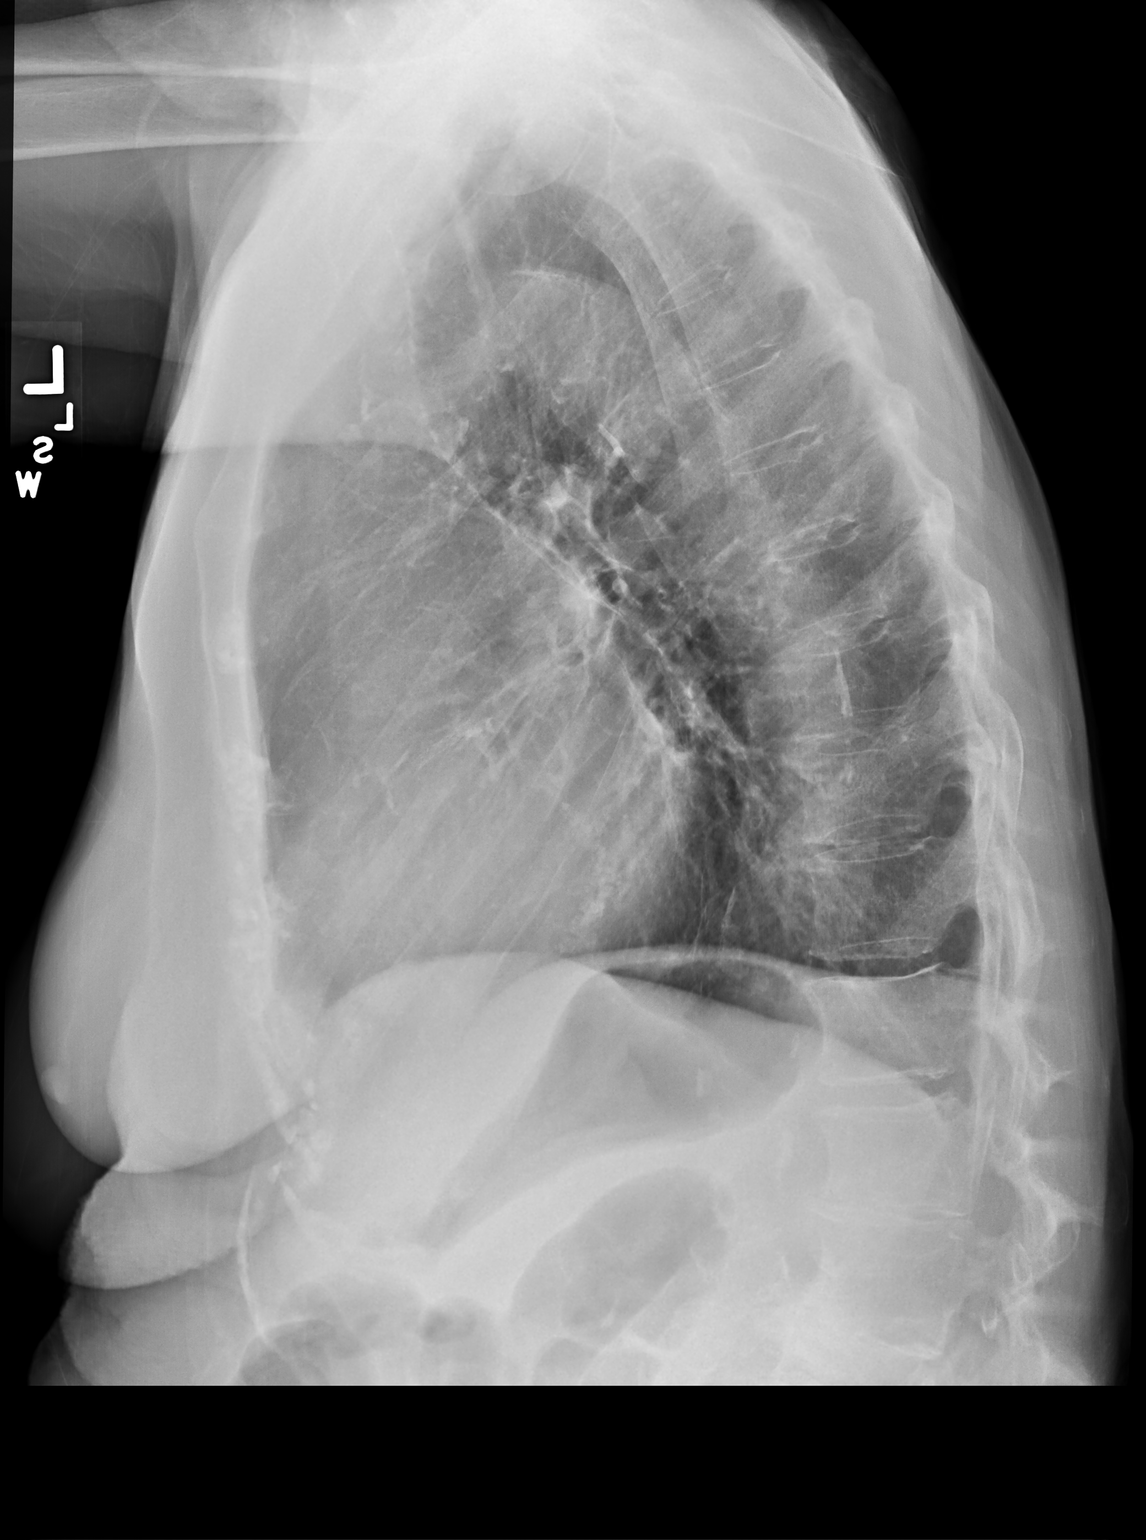

[2 of 2 positions shown; findings below may reference images not displayed]

FINDINGS: Normal heart size and pulmonary vascularity.

Calcified tortuous thoracic aorta.

Peribronchial thickening and mild emphysematous changes question
COPD.

Lungs clear.

No pulmonary infiltrate, pleural effusion or pneumothorax.

Bones appear diffusely demineralized with broad-based levoconvex
thoracolumbar scoliosis.
IMPRESSION: Question COPD changes.

No acute abnormalities.

Aortic atherosclerosis.

## 2018-06-22 IMAGING — CR DG CHEST 2V
1 series · 3 of 3 positions shown · non-contrast
Comparison: PA and lateral chest x-ray July 13, 2016

CLINICAL DATA: Paroxysms mole atrial fibrillation, aortic stenosis,
coronary artery disease with stent placement, CHF. Acute mental
status change. The patient is on anticoagulants. Remote history of
smoking.

EXAM:
CHEST  2 VIEW

[Series 1: dg chest 2 view · 0.14mm/px · 3 of 3 slices shown]
[im 1/3]
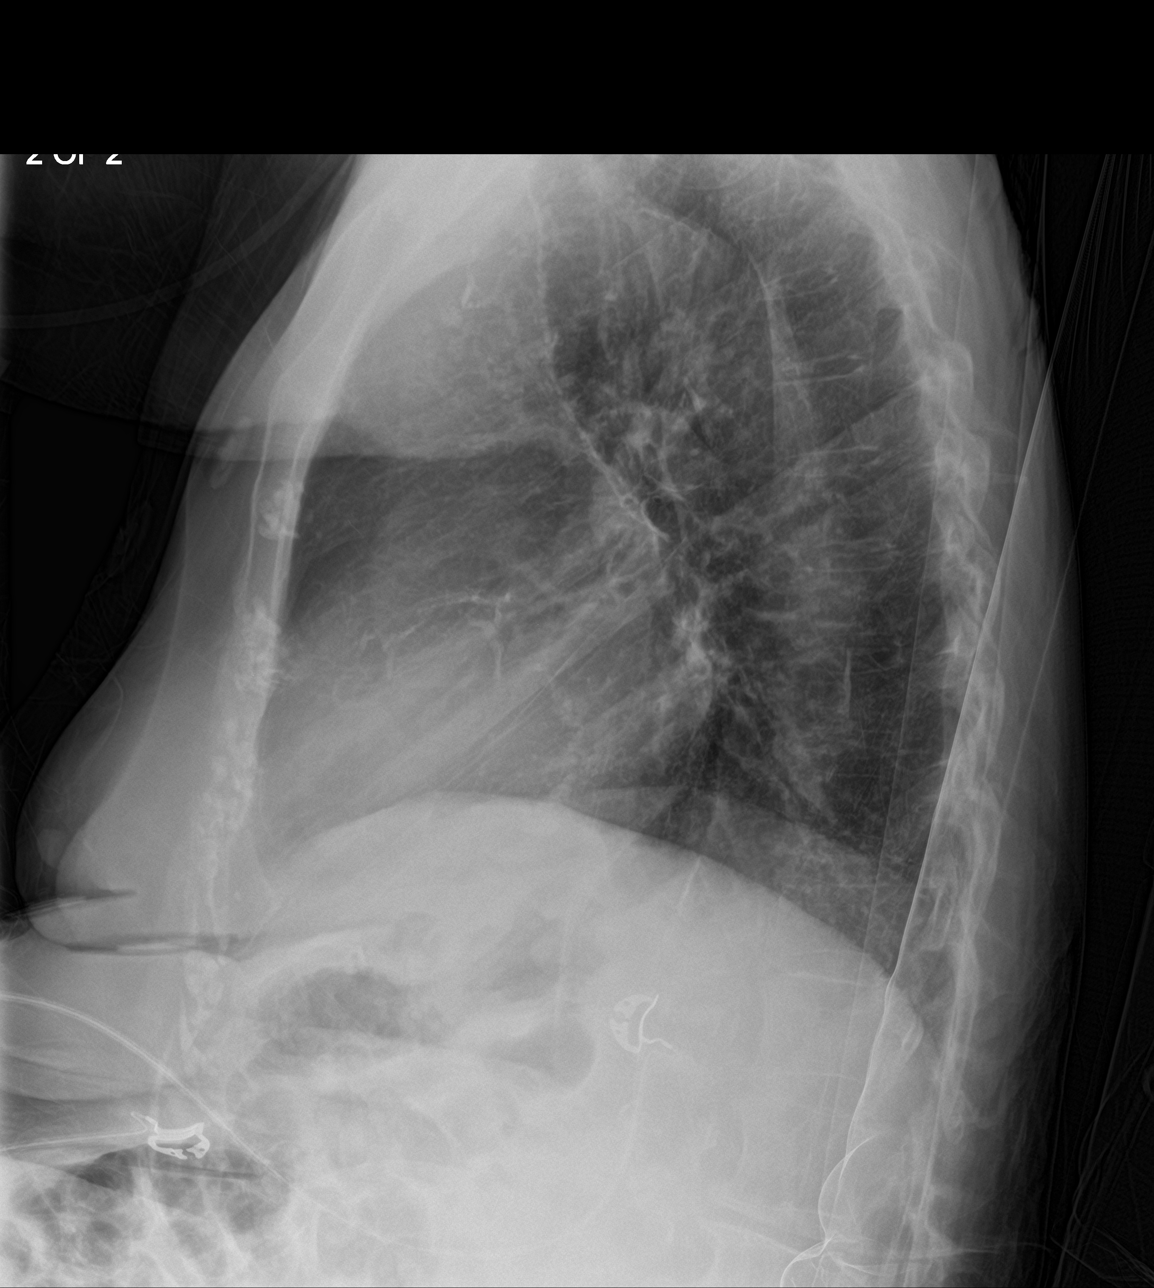
[im 2/3]
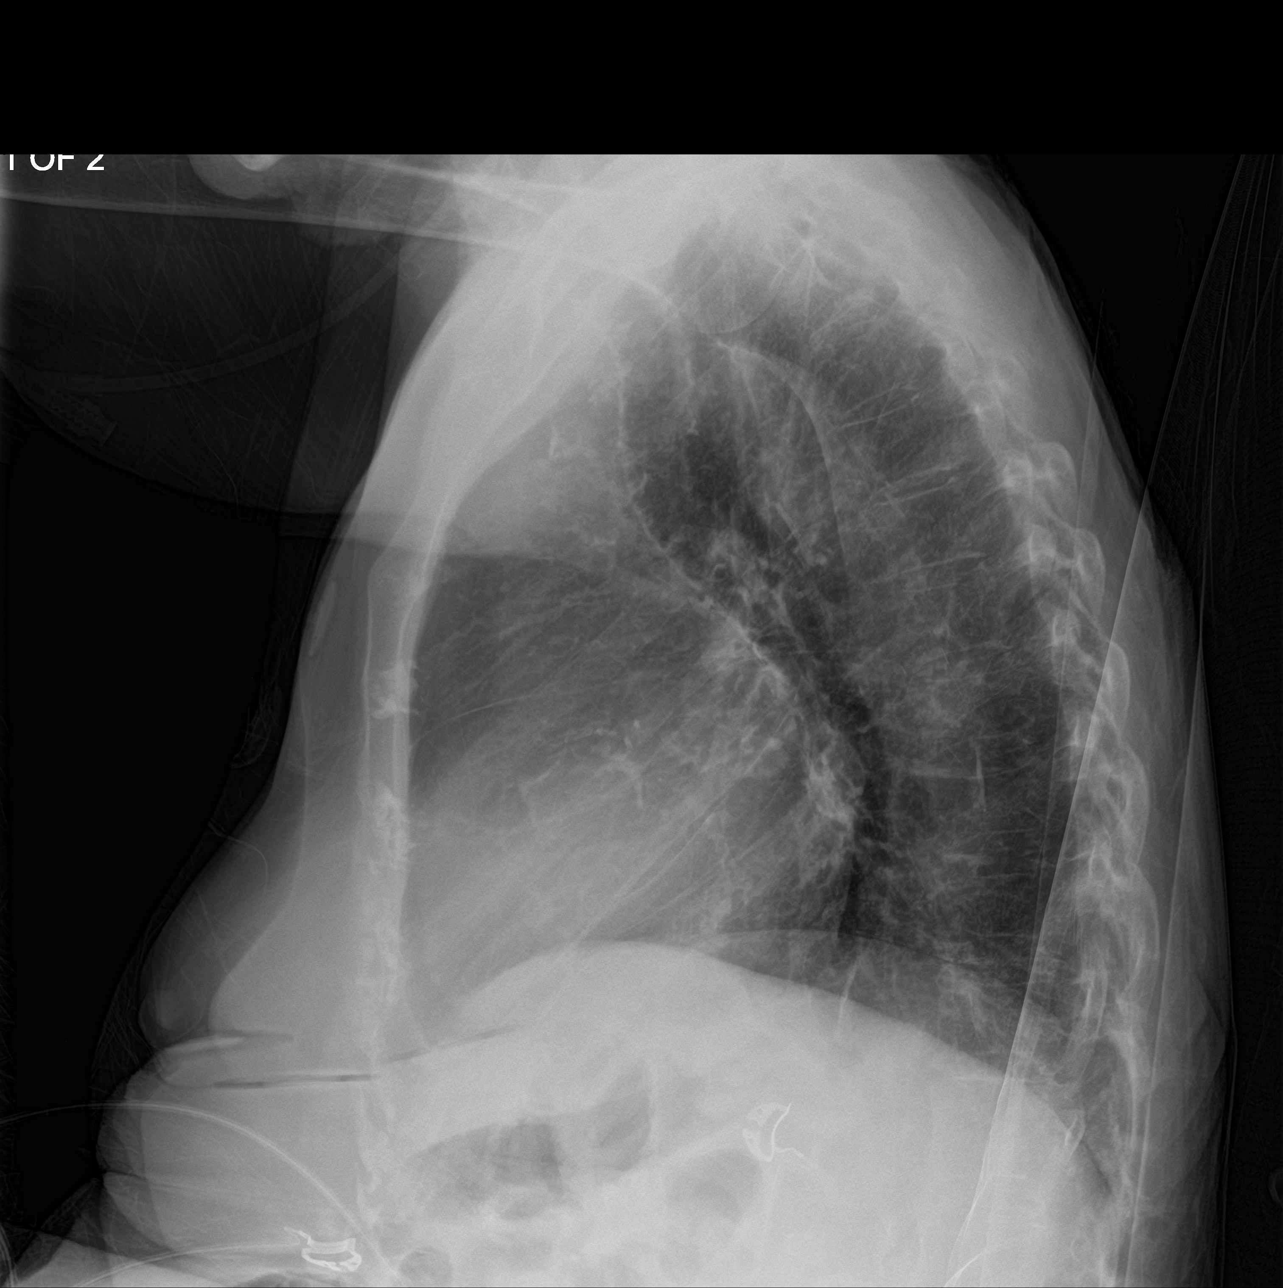
[im 3/3]
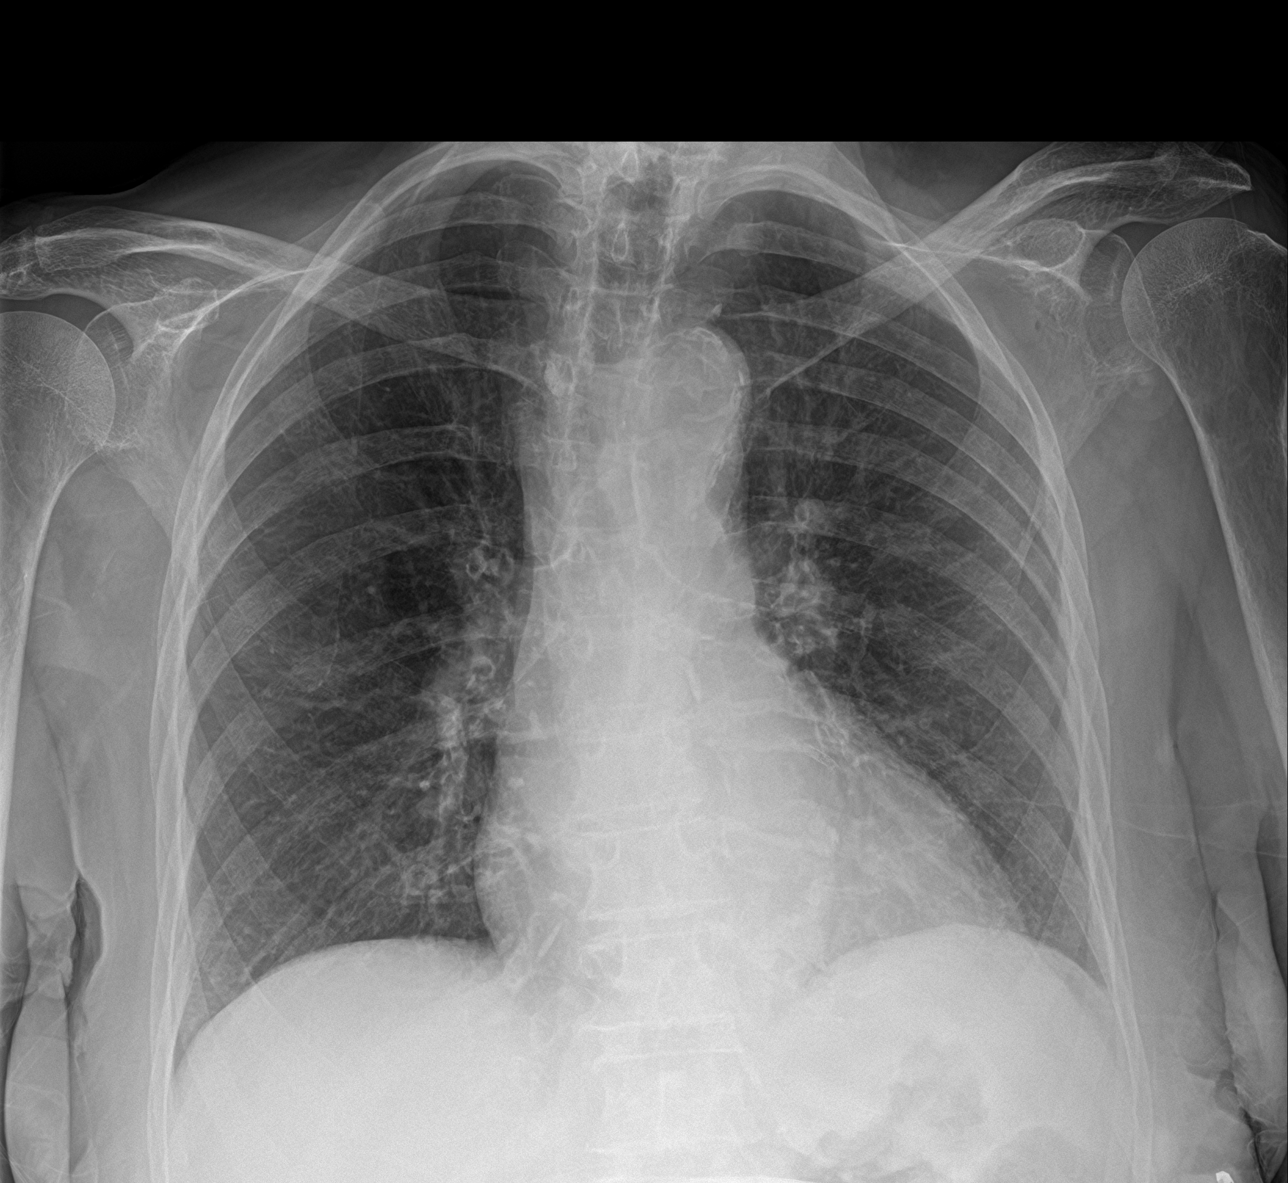

[3 of 3 positions shown; findings below may reference images not displayed]

FINDINGS: The lungs are adequately inflated and clear. The heart and pulmonary
vascularity are normal. There is calcification in the wall of the
thoracic aorta. Coronary artery calcifications are visible as well.
There is no pleural effusion. The bony thorax exhibits no acute
abnormality.
IMPRESSION: There is no acute cardiopulmonary abnormality. There are
calcifications in the coronary arteries and in the thoracic aorta.
